# Patient Record
Sex: Male | Born: 1945 | Race: White | Hispanic: No | Marital: Single | Smoking: Never smoker
Health system: Southern US, Community
[De-identification: ages and names within clinical notes are randomized; demographics above are authoritative.]

## PROBLEM LIST (undated history)

## (undated) DIAGNOSIS — A419 Sepsis, unspecified organism: Secondary | ICD-10-CM

## (undated) DIAGNOSIS — R350 Frequency of micturition: Secondary | ICD-10-CM

## (undated) DIAGNOSIS — M542 Cervicalgia: Secondary | ICD-10-CM

## (undated) DIAGNOSIS — F329 Major depressive disorder, single episode, unspecified: Secondary | ICD-10-CM

## (undated) DIAGNOSIS — I2510 Atherosclerotic heart disease of native coronary artery without angina pectoris: Secondary | ICD-10-CM

## (undated) DIAGNOSIS — N183 Chronic kidney disease, stage 3 unspecified: Secondary | ICD-10-CM

## (undated) DIAGNOSIS — E039 Hypothyroidism, unspecified: Secondary | ICD-10-CM

## (undated) DIAGNOSIS — I350 Nonrheumatic aortic (valve) stenosis: Secondary | ICD-10-CM

## (undated) DIAGNOSIS — G479 Sleep disorder, unspecified: Secondary | ICD-10-CM

## (undated) DIAGNOSIS — N179 Acute kidney failure, unspecified: Secondary | ICD-10-CM

## (undated) DIAGNOSIS — I4729 Other ventricular tachycardia: Secondary | ICD-10-CM

## (undated) DIAGNOSIS — G8929 Other chronic pain: Secondary | ICD-10-CM

## (undated) DIAGNOSIS — L709 Acne, unspecified: Secondary | ICD-10-CM

## (undated) DIAGNOSIS — I10 Essential (primary) hypertension: Secondary | ICD-10-CM

## (undated) DIAGNOSIS — K219 Gastro-esophageal reflux disease without esophagitis: Secondary | ICD-10-CM

## (undated) DIAGNOSIS — E785 Hyperlipidemia, unspecified: Secondary | ICD-10-CM

## (undated) DIAGNOSIS — C801 Malignant (primary) neoplasm, unspecified: Secondary | ICD-10-CM

## (undated) DIAGNOSIS — K222 Esophageal obstruction: Secondary | ICD-10-CM

## (undated) DIAGNOSIS — Z8551 Personal history of malignant neoplasm of bladder: Secondary | ICD-10-CM

## (undated) DIAGNOSIS — M5030 Other cervical disc degeneration, unspecified cervical region: Secondary | ICD-10-CM

## (undated) DIAGNOSIS — G5600 Carpal tunnel syndrome, unspecified upper limb: Secondary | ICD-10-CM

## (undated) DIAGNOSIS — N401 Enlarged prostate with lower urinary tract symptoms: Secondary | ICD-10-CM

## (undated) DIAGNOSIS — Q549 Hypospadias, unspecified: Secondary | ICD-10-CM

## (undated) DIAGNOSIS — C679 Malignant neoplasm of bladder, unspecified: Secondary | ICD-10-CM

## (undated) DIAGNOSIS — I5189 Other ill-defined heart diseases: Secondary | ICD-10-CM

## (undated) DIAGNOSIS — E119 Type 2 diabetes mellitus without complications: Secondary | ICD-10-CM

## (undated) DIAGNOSIS — R7303 Prediabetes: Secondary | ICD-10-CM

## (undated) DIAGNOSIS — J309 Allergic rhinitis, unspecified: Secondary | ICD-10-CM

## (undated) DIAGNOSIS — M545 Low back pain, unspecified: Secondary | ICD-10-CM

## (undated) DIAGNOSIS — I340 Nonrheumatic mitral (valve) insufficiency: Secondary | ICD-10-CM

## (undated) DIAGNOSIS — R011 Cardiac murmur, unspecified: Secondary | ICD-10-CM

## (undated) DIAGNOSIS — G4730 Sleep apnea, unspecified: Secondary | ICD-10-CM

## (undated) DIAGNOSIS — F419 Anxiety disorder, unspecified: Secondary | ICD-10-CM

## (undated) DIAGNOSIS — R161 Splenomegaly, not elsewhere classified: Secondary | ICD-10-CM

## (undated) DIAGNOSIS — N138 Other obstructive and reflux uropathy: Secondary | ICD-10-CM

## (undated) DIAGNOSIS — D696 Thrombocytopenia, unspecified: Secondary | ICD-10-CM

## (undated) DIAGNOSIS — F32A Depression, unspecified: Secondary | ICD-10-CM

## (undated) DIAGNOSIS — K861 Other chronic pancreatitis: Secondary | ICD-10-CM

## (undated) DIAGNOSIS — M47816 Spondylosis without myelopathy or radiculopathy, lumbar region: Secondary | ICD-10-CM

## (undated) DIAGNOSIS — K2970 Gastritis, unspecified, without bleeding: Secondary | ICD-10-CM

## (undated) DIAGNOSIS — K7460 Unspecified cirrhosis of liver: Secondary | ICD-10-CM

## (undated) DIAGNOSIS — K649 Unspecified hemorrhoids: Secondary | ICD-10-CM

## (undated) DIAGNOSIS — D649 Anemia, unspecified: Secondary | ICD-10-CM

## (undated) DIAGNOSIS — E876 Hypokalemia: Secondary | ICD-10-CM

## (undated) HISTORY — PX: COLONOSCOPY, ESOPHAGOGASTRODUODENOSCOPY (EGD) AND ESOPHAGEAL DILATION: SHX5781

## (undated) HISTORY — PX: FLEXIBLE SIGMOIDOSCOPY: SHX1649

## (undated) HISTORY — PX: TONSILLECTOMY: SUR1361

## (undated) HISTORY — DX: Frequency of micturition: R35.0

## (undated) HISTORY — PX: BLADDER SURGERY: SHX569

## (undated) HISTORY — PX: SEPTOPLASTY: SUR1290

## (undated) HISTORY — DX: Personal history of malignant neoplasm of bladder: Z85.51

## (undated) HISTORY — DX: Other obstructive and reflux uropathy: N13.8

## (undated) HISTORY — DX: Type 2 diabetes mellitus without complications: E11.9

## (undated) HISTORY — PX: ADENOIDECTOMY: SUR15

## (undated) HISTORY — DX: Other obstructive and reflux uropathy: N40.1

## (undated) HISTORY — DX: Sleep apnea, unspecified: G47.30

## (undated) HISTORY — DX: Hypospadias, unspecified: Q54.9

---

## 1992-12-12 HISTORY — PX: FLEXIBLE SIGMOIDOSCOPY: SHX1649

## 1994-12-12 HISTORY — PX: SEPTOPLASTY: SUR1290

## 2004-12-12 HISTORY — PX: COLONOSCOPY, ESOPHAGOGASTRODUODENOSCOPY (EGD) AND ESOPHAGEAL DILATION: SHX5781

## 2005-03-02 ENCOUNTER — Ambulatory Visit: Payer: Self-pay | Admitting: Unknown Physician Specialty

## 2005-04-05 ENCOUNTER — Emergency Department: Payer: Self-pay | Admitting: Emergency Medicine

## 2005-04-27 ENCOUNTER — Ambulatory Visit: Payer: Self-pay | Admitting: Internal Medicine

## 2006-08-20 ENCOUNTER — Emergency Department: Payer: Self-pay | Admitting: Emergency Medicine

## 2006-08-20 ENCOUNTER — Other Ambulatory Visit: Payer: Self-pay

## 2006-09-07 ENCOUNTER — Ambulatory Visit: Payer: Self-pay | Admitting: Unknown Physician Specialty

## 2006-09-11 ENCOUNTER — Ambulatory Visit: Payer: Self-pay | Admitting: Unknown Physician Specialty

## 2006-10-12 ENCOUNTER — Ambulatory Visit: Payer: Self-pay | Admitting: Unknown Physician Specialty

## 2010-02-18 ENCOUNTER — Ambulatory Visit: Payer: Self-pay | Admitting: Urology

## 2010-03-03 ENCOUNTER — Ambulatory Visit: Payer: Self-pay | Admitting: Urology

## 2010-03-06 ENCOUNTER — Emergency Department: Payer: Self-pay | Admitting: Unknown Physician Specialty

## 2010-03-08 ENCOUNTER — Ambulatory Visit: Payer: Self-pay | Admitting: Urology

## 2012-03-07 ENCOUNTER — Ambulatory Visit: Payer: Self-pay | Admitting: Urology

## 2012-03-07 DIAGNOSIS — I10 Essential (primary) hypertension: Secondary | ICD-10-CM

## 2012-03-09 LAB — URINE CULTURE

## 2012-03-14 ENCOUNTER — Ambulatory Visit: Payer: Self-pay | Admitting: Urology

## 2012-03-19 LAB — PATHOLOGY REPORT

## 2012-12-10 DIAGNOSIS — N400 Enlarged prostate without lower urinary tract symptoms: Secondary | ICD-10-CM | POA: Insufficient documentation

## 2012-12-10 DIAGNOSIS — R350 Frequency of micturition: Secondary | ICD-10-CM | POA: Insufficient documentation

## 2012-12-10 DIAGNOSIS — R3129 Other microscopic hematuria: Secondary | ICD-10-CM | POA: Insufficient documentation

## 2012-12-10 DIAGNOSIS — Z8603 Personal history of neoplasm of uncertain behavior: Secondary | ICD-10-CM | POA: Insufficient documentation

## 2012-12-10 DIAGNOSIS — Q549 Hypospadias, unspecified: Secondary | ICD-10-CM | POA: Insufficient documentation

## 2013-03-01 LAB — BASIC METABOLIC PANEL
Anion Gap: 5 — ABNORMAL LOW (ref 7–16)
Calcium, Total: 8.8 mg/dL (ref 8.5–10.1)
Creatinine: 0.86 mg/dL (ref 0.60–1.30)
EGFR (African American): >60
EGFR (Non-African Amer.): >60
Osmolality: 279 (ref 275–301)
Potassium: 4 mmol/L (ref 3.5–5.1)
Sodium: 139 mmol/L (ref 136–145)

## 2013-03-01 LAB — CBC
HCT: 41.1 % (ref 40.0–52.0)
HGB: 14 g/dL (ref 13.0–18.0)
MCH: 30.3 pg (ref 26.0–34.0)
MCHC: 34 g/dL (ref 32.0–36.0)
Platelet: 127 10*3/uL — ABNORMAL LOW (ref 150–440)
RBC: 4.6 10*6/uL (ref 4.40–5.90)
RDW: 13.1 % (ref 11.5–14.5)

## 2013-03-01 LAB — TROPONIN I: Troponin-I: <0.02 ng/mL

## 2013-03-02 ENCOUNTER — Observation Stay: Payer: Self-pay | Admitting: Internal Medicine

## 2013-03-02 DIAGNOSIS — R0602 Shortness of breath: Secondary | ICD-10-CM

## 2013-03-02 LAB — TROPONIN I
Troponin-I: <0.02 ng/mL
Troponin-I: <0.02 ng/mL

## 2013-03-02 LAB — HEMOGLOBIN A1C: Hemoglobin A1C: 5.4 % (ref 4.2–6.3)

## 2013-03-02 LAB — CK TOTAL AND CKMB (NOT AT ARMC): CK-MB: 2.1 ng/mL (ref 0.5–3.6)

## 2013-03-02 LAB — LIPID PANEL
Cholesterol: 145 mg/dL (ref 0–200)
HDL Cholesterol: 44 mg/dL (ref 40–60)
Ldl Cholesterol, Calc: 75 mg/dL (ref 0–100)
Triglycerides: 131 mg/dL (ref 0–200)
VLDL Cholesterol, Calc: 26 mg/dL (ref 5–40)

## 2013-08-27 ENCOUNTER — Ambulatory Visit: Payer: Self-pay | Admitting: Urology

## 2013-08-27 LAB — POTASSIUM: Potassium: 4.2 mmol/L (ref 3.5–5.1)

## 2013-09-18 ENCOUNTER — Ambulatory Visit: Payer: Self-pay | Admitting: Urology

## 2013-09-19 DIAGNOSIS — C672 Malignant neoplasm of lateral wall of bladder: Secondary | ICD-10-CM | POA: Insufficient documentation

## 2014-05-13 DIAGNOSIS — M545 Low back pain, unspecified: Secondary | ICD-10-CM | POA: Insufficient documentation

## 2014-05-13 DIAGNOSIS — E785 Hyperlipidemia, unspecified: Secondary | ICD-10-CM | POA: Insufficient documentation

## 2014-05-13 DIAGNOSIS — R739 Hyperglycemia, unspecified: Secondary | ICD-10-CM | POA: Insufficient documentation

## 2014-05-13 DIAGNOSIS — D649 Anemia, unspecified: Secondary | ICD-10-CM | POA: Insufficient documentation

## 2014-05-13 DIAGNOSIS — I10 Essential (primary) hypertension: Secondary | ICD-10-CM | POA: Insufficient documentation

## 2015-04-03 NOTE — Op Note (Signed)
PATIENT NAME:  Trevor [name], Trevor [name] MR#:  [id number] DATE OF BIRTH:  October 18, 1946  DATE OF PROCEDURE:  09/18/2013  PREOPERATIVE DIAGNOSIS: Bladder tumor.   POSTOPERATIVE DIAGNOSIS: Bladder tumor.   PROCEDURE: Transurethral resection of bladder tumor (small).   SURGEON: John Giovanni, M.D.   ASSISTANT: None.   ANESTHESIA: General.   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 Recent surveillance cystoscopy showed a hemorrhagic area with early papillary change on the right lateral wall. He has elected TURBT.   DESCRIPTION OF PROCEDURE: The patient was taken to the Operating Room and placed on the table in the supine position. A general anesthetic was administered via an LMA. Timeout was performed per protocol with all in agreement. The patient was then placed in the low lithotomy position and his external genitalia were prepped and draped in the usual fashion.   He had a distal shaft hypospadias. The meatus was dilated with Leander Rams sounds to 75 [place] without difficulty. A 27-French continuous-flow resectoscope sheath with visual obturator was  easily placed in the bladder. The Iglesias resectoscope was then placed into the sheath. The hemorrhagic area on the right lateral wall had resolved; however, near his prior resection site was mucosa with early papillary change. This area encompassed approximately 1 cm and was resected down to superficial muscle. Resection site was fulgurated.   At completion of procedure, hemostasis was adequate. All specimen was removed with irrigation. The ureteral orifices were normal-appearing with clear efflux. No other bladder mucosal lesions were identified. The prostate shows moderate lateral lobe enlargement. Resectoscope  was removed. An 18-French Foley catheter was placed with return of clear effluent upon irrigation. A B and O suppository was placed per rectum. He was taken to PACU in stable condition. There were no complications.   EBL: Minimal.     ____________________________ Ronda Fairly. Bernardo Heater, MD scs:cs D: 09/18/2013 15:04:00 ET T: 09/18/2013 15:52:51 ET JOB#: [id number]  cc: Nicki Reaper C. Bernardo Heater, MD, <Dictator> Abbie Sons MD ELECTRONICALLY SIGNED 09/25/2013 12:33

## 2015-04-03 NOTE — Discharge Summary (Signed)
PATIENT NAME:  Trevor [name], OSTERMILLER MR#:  [id number] DATE OF BIRTH:  11/22/1946  DATE OF ADMISSION:  03/02/2013 DATE OF DISCHARGE:  03/02/2013  DISCHARGE DIAGNOSES:  1.  Hypertensive urgency.  2.  Chest tightness.  3.  Hyperlipidemia.  4.  Gastroesophageal reflux disease.   PRIMARY CARE PHYSICIAN:  Dr. Arline Asp and Dr. Queen Blossom, [facility].   HISTORY OF PRESENT ILLNESS:  The patient is a 69 year old Caucasian male with past history of hyperlipidemia and GERD presenting to ER with chief complaint of chest tightness, was reporting that for the past 2 days he was feeling lightheaded and blood pressure was elevated when he checked it on Thursday.  It was around 140/90, but on the day of presentation it was 179/100 and subsequently it went up to 215/100.  At that time he was also having headache, chest tightness and lightheadedness and so he called his brother and he reported chest tightness to him for approximately 30 minutes, so his brother suggested him to go to the Emergency Room immediately.   HOSPITAL COURSE AND STAY:   1.  The patient was given nitroglycerin sublingual for his chest tightness and was started on oral medications to control his blood pressure better.  his heart rate was in 40s when he was sleeping.  He was started on amlodipine and hydrochlorothiazide and his blood pressure came under control, given prescription of this and advised to follow up with primary care physician.  2.  Chest tightness, was also complaining short of breath on exertion.  His troponins were negative.  EKG showed left ventricular hypertrophy and his echocardiogram was done which reported ejection fraction to be normal 55% to 60%, mild left ventricular hypertrophy, normal diastolic function, mild aortic valve sclerosis without stenosis, high-normal right ventricular systolic pressure at 32 mmHg, mild dilation of aortic root and ascending aorta.   OTHER ISSUES ADDRESSED DURING THIS HOSPITAL STAY:   1.  Chest  tightness.  Troponins were negative.  Echocardiogram reported negative for any abnormalities.  Mild hypertrophic left ventricular cardiomyopathy, low-dose of lisinopril, advised to follow up with primary care physician.  2.  Hyperlipidemia.  Atorvastatin and follow up with primary care physician.  3.  Gastroesophageal reflux disease.  Continued on PPI.   IMPORTANT LABORATORY RESULTS IN THE HOSPITAL:  Echocardiogram revealed as discussed above ago.  Troponin less than 0.02.  Cholesterol total 145, triglyceride 131, HDL 44, LDL 75.   CODE STATUS:  FULL CODE.  CONDITION ON DISCHARGE:  Stable.   DISCHARGE MEDICATIONS:  Lisinopril 5 mg once a day, atorvastatin 20 mg once a day, amlodipine 5 mg once a day, pantoprazole 40 mg delayed tablet once a day.   HOME OXYGEN:  No.   DIET ON DISCHARGE:  Low sodium, low fat, low cholesterol, regular consistency diet.   TIMEFRAME TO FOLLOW-UP:  In 1 to 2 weeks' timeframe to follow up with primary care physician to check the blood pressure control and follow up with primary care about the need of cardiac work-up.    TOTAL TIME SPENT:  45 minutes.      ____________________________ Ceasar Lund Anselm Jungling, MD vgv:ea D: 03/05/2013 23:09:57 ET T: 03/06/2013 05:57:36 ET JOB#: [id number]  cc: Ceasar Lund. Anselm Jungling, MD, <Dictator> Dr. Queen Blossom, [place]. Arline Asp, MD  Vaughan Basta MD ELECTRONICALLY SIGNED 03/14/2013 14:54

## 2015-04-03 NOTE — H&P (Signed)
PATIENT NAME:  Trevor [name], Trevor [name] MR#:  [id number] DATE OF BIRTH:  July 24, 1946  DATE OF ADMISSION:  03/02/2013  PRIMARY CARE PHYSICIAN:  Dr. Apolonio Schneiders from [place].   REFERRING PHYSICIAN:  Dr. Lemar Livings.  CHIEF COMPLAINT:  Chest tightness.   HISTORY OF PRESENT ILLNESS:  The patient is a 69 year old Caucasian male with a past medical history of hyperlipidemia and GERD is presenting to the ER with a chief complaint of chest tightness. The patient was reporting that for the past two days he was feeling lightheaded and the blood pressures were elevated. When he checked it on Thursday, it was at around 140/90 and today the blood pressure was at 179/100, and subsequently it went up to 215/100. At that time he was having headache, chest tightness and lightheadedness. As it was not getting better. He called his brother and came into the ER. He reported that the chest tightness has lasted for approximately 30 minutes. Associated with lightheadedness but denies any nausea, vomiting or diaphoresis. No similar complaints in the past. He was never diagnosed with hypertension and not on any medications. He has a blood pressure meter at home as his family members have a history of hypertension. The patient denies any stressors.   PAST MEDICAL HISTORY:  Hyperlipidemia, GERD, allergic rhinitis.   PAST SURGICAL HISTORY:  Bladder tumor removal which was benign, tonsillectomy.  ALLERGIES:  He has no known drug allergies.   HOME MEDICATIONS:  Vytorin 10/40 once daily, Sudafed 1 tablet once daily, Prilosec over-the-counter once daily, aspirin 81 mg once a day, acyclovir 400 mg 2 times a day.   PSYCHOSOCIAL HISTORY:  Lives at home, lives alone now. Denies any history of smoking, alcohol or illicit drug usage.   FAMILY HISTORY:  Mother deceased with CHF at age 49, dad deceased with heart attack at age 75, his other brother who is at age 42 had 2 coronary artery bypass grafting so far.   REVIEW OF SYSTEMS:   CONSTITUTIONAL:  Denies any fever, fatigue, weight loss or weight gain.  EYES:  No blurry vision, glaucoma, cataracts.  EARS, NOSE, THROAT:  No epistaxis, discharge, postnasal drip, or difficulty in swallowing.  RESPIRATIONS:  No cough, COPD, hemoptysis.  CARDIOVASCULAR:  Complaining of chest tightness, which completely gone now. Denies any edema, syncope, varicose veins.  GASTROINTESTINAL:  No nausea, vomiting, diarrhea, has history of GERD.  GENITOURINARY:  No dysuria or hematuria. ENDOCRINE:  No polyuria, nocturia, thyroid problems.  HEMATOLOGIC AND LYMPHATIC:  No anemia, easy bruising.  INTEGUMENTARY:  No acne, rash, lesions.  MUSCULOSKELETAL:  No pain in the neck, back, shoulder. Denies any gout or arthritis.  NEUROLOGIC:  Denies any history of vertigo, ataxia, dementia, TIA or CVA.  PSYCHIATRIC:  No insomnia, ADD, OCD.   PHYSICAL EXAMINATION: VITAL SIGNS:  Temperature 98.3, pulse 60, respirations 18, blood pressure 149/83, pulse ox 95% on 2 liters.  GENERAL APPEARANCE:  Not in acute distress, moderately built and moderately nourished.  HEENT:  Normocephalic, atraumatic. Pupils are equally reactive to light and accommodation. No conjunctival injection. No scleral icterus. Extraocular movements are intact. No sinus tenderness. Moist mucous membranes.  OROPHARYNX:  Intact. Dentition is intact. No pharyngeal tonsillar exudates.  NECK:  Supple. No JVD. No thyromegaly. No lymphadenopathy.  LUNGS:  Clear to auscultation bilaterally, no accessory muscle usage. No anterior chest wall tenderness on palpation.  CARDIAC:  S1, S2 normal. Regular rate and rhythm. No murmurs.  GASTROINTESTINAL:  Soft. Bowel sounds are positive in all 4 quadrants.  Nontender, nondistended. No hepatosplenomegaly.  NEUROLOGIC:  Awake, alert, oriented x 3. Motor and sensory are grossly intact. Reflexes are 2+. Cranial nerves II through XII are intact.  EXTREMITIES:  No edema. No cyanosis. No clubbing. Peripheral pulses  are 2+.  MUSCULOSKELETAL:  No joint effusion, erythema or tenderness.  SKIN:  No rashes, lesions. Normal turgor. Warm to touch.   LABORATORIES AND IMAGING STUDIES:  EKG has revealed normal sinus rhythm at 65 beats per minute, normal P-R interval, normal QRS interval, normal QT and corrected QTintervals. CK total 149, troponin less than 0.02 x 2. WBC 6.9, hemoglobin 14.0, hematocrit 41.1, platelet count 127, MCV 89, creatinine 0.86, sodium 139, potassium 4.0, chloride 104, CO2 30, anion gap is 5, GFR greater than 60, serum osmolality 279, calcium 8.8, glucose 125, BUN 12.   ASSESSMENT AND PLAN:  The patient is a 69 year old male presenting to the ER with a chief complaint of chest tightness when the blood pressures are elevated with also a past diagnosis of hypertension. Will be admitted with the following assessment and plan.   1.  Chest tightness, rule out acute myocardial infarction. We will admit him to telemetry under observation status, cycle cardiac biomarkers. We will implement acute coronary syndrome (ACS) protocol, oxygen, nitroglycerin, aspirin, beta blocker and statin will be provided. The chest tightness is most likely because of hypertensive malignancy. It needs to be addressed.  2.  Elevated blood pressure with a new diagnosis of hypertension. The patient is started on metoprolol 25 mg twice a day. We will monitor blood pressure closely and titrate as needed basis.  3.  Gastroesophageal reflux disease. We will provide him Protonix.  4.  Hyperlipidemia: Check fasting lipid panel in a.m., and the patient is started on statin. We will resume him to Vytorin at the time of discharge. 5.  Thrombocytopenia, etiology is unclear. We will continue close monitoring of the platelet count. We will provide him gastrointestinal and deep vein thrombosis prophylaxis.   CODE STATUS:  HE IS FULL CODE.   Plan of care discussed with the patient. The patient will be transferred to [place] in a.m.    TOTAL TIME SPENT ON THE ADMISSION:  45 minutes   ____________________________ Nicholes Mango, MD ag:jm D: 03/02/2013 03:07:00 ET T: 03/02/2013 10:20:44 ET JOB#: [id number]  cc: Nicholes Mango, MD, <Dictator> Nicholes Mango MD ELECTRONICALLY SIGNED 03/10/2013 23:14

## 2015-07-30 ENCOUNTER — Encounter: Payer: Self-pay | Admitting: *Deleted

## 2015-07-31 ENCOUNTER — Ambulatory Visit: Payer: Medicare Other | Admitting: Anesthesiology

## 2015-07-31 ENCOUNTER — Encounter
Admission: RE | Disposition: A | Discharge status: Home / Self Care | Payer: Self-pay | Source: Ambulatory Visit | Attending: Unknown Physician Specialty

## 2015-07-31 ENCOUNTER — Ambulatory Visit
Admission: RE | Admit: 2015-07-31 | Discharge: 2015-07-31 | Disposition: A | Discharge status: Home / Self Care | Payer: Medicare Other | Source: Ambulatory Visit | Attending: Unknown Physician Specialty | Admitting: Unknown Physician Specialty

## 2015-07-31 ENCOUNTER — Encounter: Payer: Self-pay | Admitting: Anesthesiology

## 2015-07-31 DIAGNOSIS — K648 Other hemorrhoids: Secondary | ICD-10-CM | POA: Insufficient documentation

## 2015-07-31 DIAGNOSIS — E785 Hyperlipidemia, unspecified: Secondary | ICD-10-CM | POA: Insufficient documentation

## 2015-07-31 DIAGNOSIS — Z1211 Encounter for screening for malignant neoplasm of colon: Secondary | ICD-10-CM | POA: Diagnosis not present

## 2015-07-31 DIAGNOSIS — G709 Myoneural disorder, unspecified: Secondary | ICD-10-CM | POA: Insufficient documentation

## 2015-07-31 DIAGNOSIS — Z79899 Other long term (current) drug therapy: Secondary | ICD-10-CM | POA: Insufficient documentation

## 2015-07-31 DIAGNOSIS — F329 Major depressive disorder, single episode, unspecified: Secondary | ICD-10-CM | POA: Insufficient documentation

## 2015-07-31 DIAGNOSIS — K219 Gastro-esophageal reflux disease without esophagitis: Secondary | ICD-10-CM | POA: Diagnosis not present

## 2015-07-31 DIAGNOSIS — M1990 Unspecified osteoarthritis, unspecified site: Secondary | ICD-10-CM | POA: Diagnosis not present

## 2015-07-31 DIAGNOSIS — M5030 Other cervical disc degeneration, unspecified cervical region: Secondary | ICD-10-CM | POA: Insufficient documentation

## 2015-07-31 DIAGNOSIS — K640 First degree hemorrhoids: Secondary | ICD-10-CM | POA: Diagnosis not present

## 2015-07-31 DIAGNOSIS — Z7982 Long term (current) use of aspirin: Secondary | ICD-10-CM | POA: Insufficient documentation

## 2015-07-31 DIAGNOSIS — Z859 Personal history of malignant neoplasm, unspecified: Secondary | ICD-10-CM | POA: Diagnosis not present

## 2015-07-31 DIAGNOSIS — I10 Essential (primary) hypertension: Secondary | ICD-10-CM | POA: Insufficient documentation

## 2015-07-31 DIAGNOSIS — F419 Anxiety disorder, unspecified: Secondary | ICD-10-CM | POA: Diagnosis not present

## 2015-07-31 HISTORY — DX: Other cervical disc degeneration, unspecified cervical region: M50.30

## 2015-07-31 HISTORY — DX: Nonrheumatic mitral (valve) insufficiency: I34.0

## 2015-07-31 HISTORY — DX: Anxiety disorder, unspecified: F41.9

## 2015-07-31 HISTORY — DX: Essential (primary) hypertension: I10

## 2015-07-31 HISTORY — DX: Anemia, unspecified: D64.9

## 2015-07-31 HISTORY — PX: COLONOSCOPY WITH PROPOFOL: SHX5780

## 2015-07-31 HISTORY — DX: Hyperlipidemia, unspecified: E78.5

## 2015-07-31 HISTORY — DX: Major depressive disorder, single episode, unspecified: F32.9

## 2015-07-31 HISTORY — DX: Acne, unspecified: L70.9

## 2015-07-31 HISTORY — DX: Low back pain, unspecified: M54.50

## 2015-07-31 HISTORY — DX: Carpal tunnel syndrome, unspecified upper limb: G56.00

## 2015-07-31 HISTORY — DX: Gastro-esophageal reflux disease without esophagitis: K21.9

## 2015-07-31 HISTORY — DX: Depression, unspecified: F32.A

## 2015-07-31 HISTORY — DX: Low back pain: M54.5

## 2015-07-31 HISTORY — DX: Unspecified hemorrhoids: K64.9

## 2015-07-31 HISTORY — DX: Malignant (primary) neoplasm, unspecified: C80.1

## 2015-07-31 HISTORY — DX: Allergic rhinitis, unspecified: J30.9

## 2015-07-31 HISTORY — DX: Cervicalgia: M54.2

## 2015-07-31 SURGERY — COLONOSCOPY WITH PROPOFOL
Anesthesia: General

## 2015-07-31 MED ORDER — SODIUM CHLORIDE 0.9 % IV SOLN
INTRAVENOUS | Status: DC
Start: 2015-07-31 — End: 2015-07-31

## 2015-07-31 MED ORDER — LIDOCAINE HCL (CARDIAC) 20 MG/ML IV SOLN
INTRAVENOUS | Status: DC | PRN
  Administered 2015-07-31: 20 mg via INTRAVENOUS

## 2015-07-31 MED ORDER — SODIUM CHLORIDE 0.9 % IV SOLN
INTRAVENOUS | Status: DC
  Administered 2015-07-31: 1000 mL via INTRAVENOUS

## 2015-07-31 MED ORDER — PROPOFOL 10 MG/ML IV BOLUS
INTRAVENOUS | Status: DC | PRN
  Administered 2015-07-31: 80 mg via INTRAVENOUS

## 2015-07-31 MED ORDER — PROPOFOL INFUSION 10 MG/ML OPTIME
INTRAVENOUS | Status: DC | PRN
  Administered 2015-07-31: 120 ug/kg/min via INTRAVENOUS

## 2015-07-31 MED ORDER — GLYCOPYRROLATE 0.2 MG/ML IJ SOLN
INTRAMUSCULAR | Status: DC | PRN
Start: 2015-07-31 — End: 2015-07-31
  Administered 2015-07-31: 0.2 mg via INTRAVENOUS

## 2015-07-31 NOTE — H&P (Signed)
Primary Care Physician:  Marcello Fennel, MD Primary Gastroenterologist:  Dr. Vira Agar  Pre-Procedure History & Physical: HPI:  Trevor [name] is a 69 y.o. male is here for an colonoscopy.   Past Medical History  Diagnosis Date  . Hypertension   . Anemia   . Hyperlipidemia   . Low back pain   . Cervicalgia   . Allergic rhinitis   . GERD (gastroesophageal reflux disease)   . Hemorrhoids   . Acne   . Carpal tunnel syndrome   . Anxiety   . Depression   . Cancer   . Mitral regurgitation   . Degenerative disc disease, cervical     Past Surgical History  Procedure Laterality Date  . Tonsillectomy    . Septoplasty    . Colonoscopy, esophagogastroduodenoscopy (egd) and esophageal dilation    . Flexible sigmoidoscopy      Prior to Admission medications   Medication Sig Start Date End Date Taking? Authorizing Provider  amoxicillin-clavulanate (AUGMENTIN) 875-125 MG per tablet Take 1 tablet by mouth 2 (two) times daily.   Yes Historical Provider, MD  aspirin 81 MG tablet Take 81 mg by mouth daily.   Yes Historical Provider, MD  b complex vitamins capsule Take 1 capsule by mouth daily.   Yes Historical Provider, MD  cetirizine (ZYRTEC) 10 MG tablet Take 10 mg by mouth daily.   Yes Historical Provider, MD  lisinopril-hydrochlorothiazide (PRINZIDE,ZESTORETIC) 20-12.5 MG per tablet Take 1 tablet by mouth daily.   Yes Historical Provider, MD  Multiple Vitamin (MULTIVITAMIN WITH MINERALS) TABS tablet Take 1 tablet by mouth daily.   Yes Historical Provider, MD  omeprazole (PRILOSEC OTC) 20 MG tablet Take 20 mg by mouth daily.   Yes Historical Provider, MD  simvastatin (ZOCOR) 40 MG tablet Take 40 mg by mouth daily.   Yes Historical Provider, MD  sucralfate (CARAFATE) 1 G tablet Take 1.5 g by mouth 4 (four) times daily -  with meals and at bedtime.   Yes Historical Provider, MD  tamsulosin (FLOMAX) 0.4 MG CAPS capsule Take 0.4 mg by mouth.   Yes Historical Provider, MD    Allergies as of  06/16/2015  . (Not on File)    History reviewed. No pertinent family history.  Social History   Social History  . Marital Status: Single    Spouse Name: N/A  . Number of Children: N/A  . Years of Education: N/A   Occupational History  . Not on file.   Social History Main Topics  . Smoking status: Not on file  . Smokeless tobacco: Not on file  . Alcohol Use: Not on file  . Drug Use: Not on file  . Sexual Activity: Not on file   Other Topics Concern  . Not on file   Social History Narrative    Review of Systems: See HPI, otherwise negative ROS  Physical Exam: BP 143/84 mmHg  Temp(Src) 98 F (36.7 C) (Tympanic)  Resp 22  Ht 5\' 8"  (1.727 m)  Wt 81.647 kg (180 lb)  BMI 27.38 kg/m2  SpO2 100% General:   Alert,  pleasant and cooperative in NAD Head:  Normocephalic and atraumatic. Neck:  Supple; no masses or thyromegaly. Lungs:  Clear throughout to auscultation.    Heart:  Regular rate and rhythm. Abdomen:  Soft, nontender and nondistended. Normal bowel sounds, without guarding, and without rebound.   Neurologic:  Alert and  oriented x4;  grossly normal neurologically.  Impression/Plan: Trevor [name] is here for an  colonoscopy to be performed for screening  Risks, benefits, limitations, and alternatives regarding  colonoscopy have been reviewed with the patient.  Questions have been answered.  All parties agreeable.   Gaylyn Cheers, MD  07/31/2015, 10:08 AM

## 2015-07-31 NOTE — Anesthesia Postprocedure Evaluation (Signed)
  Anesthesia Post-op Note  Patient: Trevor [name]  Procedure(s) Performed: Procedure(s): COLONOSCOPY WITH PROPOFOL (N/A)  Anesthesia type:General  Patient location: PACU  Post pain: Pain level controlled  Post assessment: Post-op Vital signs reviewed, Patient's Cardiovascular Status Stable, Respiratory Function Stable, Patent Airway and No signs of Nausea or vomiting  Post vital signs: Reviewed and stable  Last Vitals:  Filed Vitals:   07/31/15 1105  BP: 123/60  Pulse: 54  Temp:   Resp: 16    Level of consciousness: awake, alert  and patient cooperative  Complications: No apparent anesthesia complications

## 2015-07-31 NOTE — Anesthesia Preprocedure Evaluation (Signed)
Anesthesia Evaluation  Patient identified by MRN, date of birth, ID band Patient awake    Reviewed: Allergy & Precautions, H&P , NPO status , Patient's Chart, lab work & pertinent test results  Airway Mallampati: III  TM Distance: >3 FB Neck ROM: limited    Dental  (+) Poor Dentition   Pulmonary neg pulmonary ROS,  breath sounds clear to auscultation  Pulmonary exam normal       Cardiovascular Exercise Tolerance: Good hypertension, - Past MI Normal cardiovascular examRhythm:regular Rate:Normal     Neuro/Psych PSYCHIATRIC DISORDERS Anxiety Depression  Neuromuscular disease    GI/Hepatic Neg liver ROS, GERD-  Controlled,  Endo/Other  negative endocrine ROS  Renal/GU negative Renal ROS  negative genitourinary   Musculoskeletal  (+) Arthritis -,   Abdominal   Peds  Hematology negative hematology ROS (+)   Anesthesia Other Findings Past Medical History:   Hypertension                                                 Anemia                                                       Hyperlipidemia                                               Low back pain                                                Cervicalgia                                                  Allergic rhinitis                                            GERD (gastroesophageal reflux disease)                       Hemorrhoids                                                  Acne                                                         Carpal tunnel syndrome  Anxiety                                                      Depression                                                   Cancer                                                       Mitral regurgitation                                         Degenerative disc disease, cervical                          Reproductive/Obstetrics negative OB ROS                              Anesthesia Physical Anesthesia Plan  ASA: III  Anesthesia Plan: General   Post-op Pain Management:    Induction:   Airway Management Planned:   Additional Equipment:   Intra-op Plan:   Post-operative Plan:   Informed Consent: I have reviewed the patients History and Physical, chart, labs and discussed the procedure including the risks, benefits and alternatives for the proposed anesthesia with the patient or authorized representative who has indicated his/her understanding and acceptance.   Dental Advisory Given  Plan Discussed with: Anesthesiologist and Surgeon  Anesthesia Plan Comments:         Anesthesia Quick Evaluation

## 2015-07-31 NOTE — Op Note (Signed)
[facility] Gastroenterology Patient Name: Trevor [name] Procedure Date: 07/31/2015 10:03 AM MRN: [id number] Account #: [id number] Date of Birth: Oct 30, 1946 Admit Type: Outpatient Age: 69 Room: [facility] ENDO ROOM 1 Gender: Male Note Status: Finalized Procedure:         Colonoscopy Indications:       Screening for colorectal malignant neoplasm Providers:         Manya Silvas, MD Referring MD:      Caprice Renshaw (Referring MD) Medicines:         Propofol per Anesthesia Complications:     No immediate complications. Procedure:         Pre-Anesthesia Assessment:                    - After reviewing the risks and benefits, the patient was                     deemed in satisfactory condition to undergo the procedure.                    After obtaining informed consent, the colonoscope was                     passed under direct vision. Throughout the procedure, the                     patient's blood pressure, pulse, and oxygen saturations                     were monitored continuously. The Olympus PCF-H180AL                     colonoscope ( S#: [id number] ) was introduced through the                     anus and advanced to the the cecum, identified by                     appendiceal orifice and ileocecal valve. The colonoscopy                     was performed without difficulty. The patient tolerated                     the procedure well. The quality of the bowel preparation                     was excellent. Findings:      Internal hemorrhoids were found during endoscopy. The hemorrhoids were       small and Grade I (internal hemorrhoids that do not prolapse).      The exam was otherwise without abnormality. Impression:        - Internal hemorrhoids.                    - The examination was otherwise normal.                    - No specimens collected. Recommendation:    - Repeat colonoscopy in 10 years for screening purposes. Manya Silvas, MD 07/31/2015  10:37:21 AM This report has been signed electronically. Number of Addenda: 0 Note Initiated On: 07/31/2015 10:03 AM Scope Withdrawal Time: 0 hours 13 minutes 27 seconds  Total Procedure Duration: 0 hours 17 minutes 33 seconds  [facility]

## 2015-07-31 NOTE — Transfer of Care (Signed)
Immediate Anesthesia Transfer of Care Note  Patient: Trevor [name]  Procedure(s) Performed: Procedure(s): COLONOSCOPY WITH PROPOFOL (N/A)  Patient Location: Endoscopy Unit  Anesthesia Type:General  Level of Consciousness: sedated  Airway & Oxygen Therapy: Patient Spontanous Breathing and Patient connected to nasal cannula oxygen  Post-op Assessment: Report given to RN and Post -op Vital signs reviewed and stable  Post vital signs: Reviewed and stable  Last Vitals:  Filed Vitals:   07/31/15 0906  BP: 143/84  Temp: 36.7 C  Resp: 22    Complications: No apparent anesthesia complications

## 2015-08-03 ENCOUNTER — Encounter: Payer: Self-pay | Admitting: Unknown Physician Specialty

## 2015-10-09 ENCOUNTER — Other Ambulatory Visit: Payer: Self-pay

## 2015-10-21 ENCOUNTER — Encounter: Payer: Self-pay | Admitting: Urology

## 2015-10-23 ENCOUNTER — Ambulatory Visit (INDEPENDENT_AMBULATORY_CARE_PROVIDER_SITE_OTHER): Payer: Medicare Other | Admitting: Urology

## 2015-10-23 ENCOUNTER — Encounter: Payer: Self-pay | Admitting: Urology

## 2015-10-23 VITALS — BP 155/90 | HR 79 | Ht 68.0 in | Wt 183.7 lb

## 2015-10-23 DIAGNOSIS — R351 Nocturia: Secondary | ICD-10-CM | POA: Insufficient documentation

## 2015-10-23 DIAGNOSIS — Z8551 Personal history of malignant neoplasm of bladder: Secondary | ICD-10-CM

## 2015-10-23 DIAGNOSIS — Q541 Hypospadias, penile: Secondary | ICD-10-CM | POA: Diagnosis not present

## 2015-10-23 DIAGNOSIS — N400 Enlarged prostate without lower urinary tract symptoms: Secondary | ICD-10-CM

## 2015-10-23 LAB — MICROSCOPIC EXAMINATION
BACTERIA UA: NONE SEEN
EPITHELIAL CELLS (NON RENAL): NONE SEEN /HPF (ref 0–10)

## 2015-10-23 LAB — URINALYSIS, COMPLETE
BILIRUBIN UA: NEGATIVE
GLUCOSE, UA: NEGATIVE
Ketones, UA: NEGATIVE
LEUKOCYTES UA: NEGATIVE
Nitrite, UA: NEGATIVE
PROTEIN UA: NEGATIVE
SPEC GRAV UA: 1.02 (ref 1.005–1.030)
UUROB: 0.2 mg/dL (ref 0.2–1.0)
pH, UA: 6.5 (ref 5.0–7.5)

## 2015-10-23 MED ORDER — CIPROFLOXACIN HCL 500 MG PO TABS
500.0000 mg | ORAL_TABLET | Freq: Once | ORAL | Status: AC
  Administered 2015-10-23: 500 mg via ORAL

## 2015-10-23 MED ORDER — LIDOCAINE HCL 2 % EX GEL
1.0000 "application " | Freq: Once | CUTANEOUS | Status: AC
  Administered 2015-10-23: 1 via URETHRAL

## 2015-10-23 NOTE — Progress Notes (Signed)
10/23/2015 11:42 AM   Trevor [name] February 05, 1946 [id number]  Referring provider: Derinda Late, MD [phone] [facility] [address], [place] [address]  Chief Complaint  Patient presents with  . Cysto    52month    HPI: 69 year old male with a history of bladder cancer first diagnosed in 2011, no great TA disease identified and gross hematuria workup. 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  His most recent surveillance cystoscopy was 04/12/2015 at which time we were surveying a small area of hypervascularity near the area of resection scar measuring less than 0.5 cm adjacent to the right UO which remained unchanged from previous cysto. Urine cytology was negative.  No recent episodes of gross hematuria.  No risk factors for bladder cancer including no smoking history.  Most recent upper tract imaging at the time of diagnosis in 2011.   He does have some mild urinary symptoms associated with BPH currently on Flomax. Today, he complains that he is most bothered by nocturia 3-4. He does admit that he drinks caffeinated average blood before bed. He is a snorer and has never been worked up for sleep apnea. He is recently started a supplement which has not helped with his urinary symptoms.  PSA/rectal exams performed by a PCP annually.  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  He returns today for 6 month surveillance cystoscopy.   PMH: Past Medical History  Diagnosis Date  . Hypertension   . Anemia   . Hyperlipidemia   . Low back pain   . Cervicalgia   . Allergic rhinitis   . GERD (gastroesophageal reflux disease)   . Hemorrhoids   . Acne   . Carpal tunnel syndrome   . Anxiety   . Depression   . Cancer ([place])   . Mitral regurgitation   .  Degenerative disc disease, cervical   . Urinary frequency   . Sleep apnea   . Hypospadias   . BPH (benign prostatic hypertrophy) with urinary obstruction   . History of malignant neoplasm of bladder     Surgical History: Past Surgical History  Procedure Laterality Date  . Tonsillectomy    . Septoplasty    . Colonoscopy, esophagogastroduodenoscopy (egd) and esophageal dilation    . Flexible sigmoidoscopy    . Colonoscopy with propofol N/A 07/31/2015    Procedure: COLONOSCOPY WITH PROPOFOL;  Surgeon: Manya Silvas, MD;  [facility]: [facility] ENDOSCOPY;  Service: Endoscopy;  Laterality: N/A;  . Bladder surgery      2011, 2013, 2015    Home Medications:    Medication List       This list is accurate as of: 10/23/15 11:42 AM.  Always use your most recent med list.               aspirin 81 MG tablet  Take 81 mg by mouth daily.     b complex vitamins capsule  Take 1 capsule by mouth daily.     cetirizine 10 MG tablet  Commonly known as:  ZYRTEC  Take 10 mg by mouth daily.     lisinopril-hydrochlorothiazide 10-12.5 MG tablet  Commonly known as:  PRINZIDE,ZESTORETIC     multivitamin with minerals Tabs tablet  Take 1 tablet by mouth daily.     omeprazole 20 MG tablet  Commonly known as:  PRILOSEC OTC  Take 20 mg by mouth daily.     simvastatin 40 MG tablet  Commonly known as:  ZOCOR  Take 40 mg by mouth daily.     sucralfate 1 G tablet  Commonly known as:  CARAFATE  Take 1.5 g by mouth 4 (four) times daily -  with meals and at bedtime.     tamsulosin 0.4 MG Caps capsule  Commonly known as:  FLOMAX  Take 0.4 mg by mouth.     URINOZINC PLUS Tabs  Take by mouth.        Allergies: No Known Allergies  Family History: Family History  Problem Relation Age of Onset  . Hyperlipidemia Brother   . Hyperlipidemia Sister     Social History:  reports that he has never smoked. He does not have any smokeless tobacco history on file. He reports that he does not drink  alcohol or use illicit drugs.  ROS: UROLOGY Frequent Urination?: Yes Hard to postpone urination?: No Burning/pain with urination?: No Get up at night to urinate?: Yes Leakage of urine?: No Urine stream starts and stops?: Yes Trouble starting stream?: No Do you have to strain to urinate?: No Blood in urine?: No Urinary tract infection?: No Sexually transmitted disease?: No Injury to kidneys or bladder?: No Painful intercourse?: No Weak stream?: No Erection problems?: No Penile pain?: No  Gastrointestinal Nausea?: No Vomiting?: No Indigestion/heartburn?: No Diarrhea?: No Constipation?: No  Constitutional Fever: No Night sweats?: No Weight loss?: No Fatigue?: No  Skin Skin rash/lesions?: No Itching?: No  Eyes Blurred vision?: No Double vision?: No  Ears/Nose/Throat Sore throat?: No Sinus problems?: Yes  Hematologic/Lymphatic Swollen glands?: No Easy bruising?: No  Cardiovascular Leg swelling?: No Chest pain?: No  Respiratory Cough?: No Shortness of breath?: No  Endocrine Excessive thirst?: No  Musculoskeletal Back pain?: Yes Joint pain?: Yes  Neurological Headaches?: No Dizziness?: No  Psychologic Depression?: No Anxiety?: No  Physical Exam: BP 155/90 mmHg  Pulse 79  Ht 5\' 8"  (1.727 m)  Wt 183 lb 11.2 oz (83.326 kg)  BMI 27.94 kg/m2  Constitutional:  Alert and oriented, No acute distress. HEENT: [place] AT, moist mucus membranes.  Trachea midline, no masses. Cardiovascular: No clubbing, cyanosis, or edema. Respiratory: Normal respiratory effort, no increased work of breathing. GI: Abdomen is soft, nontender, nondistended, no abdominal masses GU: No CVA tenderness. Normal bilateral descended testicles. Phallus with dorsal hood, subcoronal hypospadias. Skin: No rashes, bruises or suspicious lesions. Lymph: No cervical or inguinal adenopathy. Neurologic: Grossly intact, no focal deficits, moving all 4 extremities. Psychiatric: Normal mood  and affect.  Laboratory Data: Lab Results  Component Value Date   WBC 6.9 03/01/2013   HGB 14.0 03/01/2013   HCT 41.1 03/01/2013   MCV 89 03/01/2013   PLT 127* 03/01/2013    Lab Results  Component Value Date   CREATININE 0.86 03/01/2013     Lab Results  Component Value Date   HGBA1C 5.4 03/01/2013    Urinalysis Urine dipstick shows trace blood.  Micro exam: negative for WBC's or RBC's.   Cystoscopy Procedure Note  Patient identification was confirmed, informed consent was obtained, and patient was prepped using Betadine solution.  Lidocaine jelly was administered per urethral meatus.    Preoperative abx where received prior to procedure.     Pre-Procedure: - Inspection reveals a normal caliber ureteral meatus.  Procedure: The flexible cystoscope was introduced without difficulty through ventral hypospadiac urethral meatus - No urethral strictures/lesions are present.   -  Normal prostate  - Normal bladder neck - Bilateral ureteral orifices identified - Bladder mucosa  reveals no ulcers, tumors, or lesions - No bladder stones - No trabeculation  Retroflexion shows unremarkable prostate/ bladder neck.   Post-Procedure: - Patient tolerated the procedure well   Assessment & Plan:    1. Personal history of malignant neoplasm of bladder Cystoscopy today unremarkable. Recommend per AUA guidelines for cystoscopy annually up to 5 years from 2014, his last known tumor. - Urinalysis, Complete - ciprofloxacin (CIPRO) tablet 500 mg; Take 1 tablet (500 mg total) by mouth once. - lidocaine (XYLOCAINE) 2 % jelly 1 application; Place 1 application into the urethra once.  2. Penile hypospadias Unbothered, no intervention.  3. Benign prostatic hypertrophy without urinary obstruction Continue Flomax.  4. Nocturia We had a lengthy discussion today about behavioral modification and avoidance of beverages, particularly caffeinated beverages in the hours prior to going to  bed. Additionally, he does snore and we discussed the physiology of nocturia associated with possible undiagnosed OSA. He will discuss this further with his PCP and consider possibly having a sleep study.   Return in about 1 year (around 10/22/2016) for cystoscopy.  Hollice Espy, MD  [facility] [address] [place], [place] [address] [phone]

## 2015-12-24 ENCOUNTER — Ambulatory Visit: Payer: Medicare Other | Attending: Neurology

## 2015-12-24 DIAGNOSIS — G4761 Periodic limb movement disorder: Secondary | ICD-10-CM | POA: Insufficient documentation

## 2015-12-24 DIAGNOSIS — G4733 Obstructive sleep apnea (adult) (pediatric): Secondary | ICD-10-CM | POA: Insufficient documentation

## 2015-12-24 DIAGNOSIS — G4710 Hypersomnia, unspecified: Secondary | ICD-10-CM | POA: Diagnosis present

## 2016-01-06 ENCOUNTER — Ambulatory Visit: Payer: Medicare Other | Attending: Neurology

## 2016-01-06 DIAGNOSIS — G4733 Obstructive sleep apnea (adult) (pediatric): Secondary | ICD-10-CM | POA: Diagnosis not present

## 2016-01-06 DIAGNOSIS — G4761 Periodic limb movement disorder: Secondary | ICD-10-CM | POA: Insufficient documentation

## 2016-01-20 ENCOUNTER — Other Ambulatory Visit: Payer: Self-pay | Admitting: Urology

## 2016-01-20 DIAGNOSIS — N400 Enlarged prostate without lower urinary tract symptoms: Secondary | ICD-10-CM

## 2016-02-29 ENCOUNTER — Encounter: Payer: Self-pay | Admitting: Physical Therapy

## 2016-02-29 ENCOUNTER — Ambulatory Visit: Payer: Medicare Other | Attending: Family Medicine | Admitting: Physical Therapy

## 2016-02-29 DIAGNOSIS — M5410 Radiculopathy, site unspecified: Secondary | ICD-10-CM | POA: Diagnosis present

## 2016-02-29 DIAGNOSIS — M542 Cervicalgia: Secondary | ICD-10-CM | POA: Diagnosis present

## 2016-02-29 DIAGNOSIS — M436 Torticollis: Secondary | ICD-10-CM | POA: Insufficient documentation

## 2016-02-29 NOTE — Therapy (Signed)
[place] MAIN [facility] SERVICES [address] [place], [place], [address] Phone: [phone]   Fax:  [phone]  Physical Therapy Evaluation  Patient Details  Name: Trevor [name] MRN: [id number] Date of Birth: 1946/05/15 Referring Provider: Dr. Whitney Post  Encounter Date: 02/29/2016      PT End of Session - 02/29/16 1638    Visit Number 1   Number of Visits 13   Date for PT Re-Evaluation 03/28/16   Authorization Type Gcode 1   Authorization Time Period 10   PT Start Time 1540   PT Stop Time 1630   PT Time Calculation (min) 50 min   Activity Tolerance Patient tolerated treatment well;No increased pain   Behavior During Therapy [facility] for tasks assessed/performed      Past Medical History  Diagnosis Date  . Anemia   . Hyperlipidemia   . Low back pain   . Cervicalgia   . Allergic rhinitis   . GERD (gastroesophageal reflux disease)   . Hemorrhoids   . Acne   . Carpal tunnel syndrome   . Anxiety   . Mitral regurgitation   . Urinary frequency   . Sleep apnea   . Hypospadias   . BPH (benign prostatic hypertrophy) with urinary obstruction   . History of malignant neoplasm of bladder   . Hypertension     controlled with medication;   . Depression     controlled;   . Degenerative disc disease, cervical     neck and back;   . Cancer [facility])     bladder CA; remission since Oct 2014    Past Surgical History  Procedure Laterality Date  . Tonsillectomy    . Septoplasty    . Colonoscopy, esophagogastroduodenoscopy (egd) and esophageal dilation    . Flexible sigmoidoscopy    . Colonoscopy with propofol N/A 07/31/2015    Procedure: COLONOSCOPY WITH PROPOFOL;  Surgeon: Manya Silvas, MD;  Location: [facility] ENDOSCOPY;  Service: Endoscopy;  Laterality: N/A;  . Bladder surgery      2011, 2013, 2015    There were no vitals filed for this visit.  Visit Diagnosis:  Neck pain - Plan: PT plan of care cert/re-cert  Radiculopathy affecting  upper extremity - Plan: PT plan of care cert/re-cert  Neck stiffness - Plan: PT plan of care cert/re-cert      Subjective Assessment - 02/29/16 1544    Subjective 70 yo Male reports increased numbness in left hand along 3-5 digits with 5th digit being worst; He reports also having aches/pains down neck and down shoulder/arm; Patient has a history of LUE numbness which first occurred about 5 years ago with good results with resolving of symptoms; He reports that last summer his numbness started coming back on; He reports increased numbness with pressure along left forearm; He reports nothing seems to help relieve symptoms; He reports trying heat and bengay with no results; He is right handed; Patient works with [employer], broadcasting/film/video; 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 1 hour   How long can you stand comfortably? 20 min; limited by back pain;    How long can you walk comfortably? >500 feet; walking  helps with discomfort;    Diagnostic tests none reported;    Currently in Pain? Yes   Pain Score 3    Pain Location Arm   Pain Orientation Left   Pain Descriptors / Indicators Aching;Sore   Pain Type Chronic pain   Pain Radiating Towards left hand;    Pain Onset More than a month ago   Pain Frequency Constant   Aggravating Factors  leaning on arm;    Pain Relieving Factors movement/activity;             [facility] PT Assessment - 02/29/16 0001    Assessment   Medical Diagnosis LUE radiculopathy;    Referring Provider Dr. Whitney Post   Onset Date/Surgical Date 05/13/15   Hand Dominance Right   Next MD Visit June 2017   Prior Therapy had  PT about 5 years ago with good results and resolution of symptoms;    Precautions   Precautions None   Restrictions   Weight Bearing Restrictions No   Balance Screen   Has the patient fallen in the past 6 months No   Has the patient had a decrease in activity level because of a fear of falling?  No   Is the patient reluctant to leave their home because of a fear of falling?  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;    Leisure read, watch TV, watches dramas, westerns, old movies;    Cognition   Overall Cognitive Status Within Functional Limits for tasks assessed   Observation/Other Assessments   Neck Disability Index  10% (minimal disability)   Sensation   Light Touch Appears Intact   Additional Comments has tingling along 3-5 digits but intact light touch and deep pressure;   Coordination   Gross Motor Movements are Fluid and Coordinated Yes   Fine Motor Movements are Fluid and Coordinated Yes   Finger Nose Finger Test accurate bilaterally;   Posture/Postural Control   Posture Comments exhibits moderate slumped posture with rounded shoulders/forward head;    AROM   Overall AROM Comments BUE AROM is [facility]   Cervical Flexion 40   Cervical Extension 40   Cervical - Right Side Bend 35   Cervical - Left Side Bend 25  with pain;   Cervical - Right Rotation 75   Cervical - Left Rotation 75  with pain;   Strength   Overall Strength Comments BUE gross strength is WFL   Right Hand Grip (lbs) 75   Left Hand Grip (lbs) 65   Palpation   Spinal mobility hypomobility noted throughout cervical spine;    Palpation comment denies any tenderness to palpation; has some tightness in  bilateral upper traps; Reports no change in pain with gentle cervical distraction; Has discomfort with suboccipital release;    Special Tests    Special Tests Cervical   Spurling's   Findings Positive   Side Left   Comment with left cervical extension and rotation;    Distraction Test   Findngs Negative   side Left   Transfers   Comments able to transfer sit<>stand independently ;    Ambulation/Gait   Gait Comments ambulates independently; no imbalance noted;  TREATMENT: Initiated HEP: Chin tucks 3 sec hold x5 reps; Posterior shoulder rolls x10 reps with mod VCs for technique including shoulder elevation, retraction then depression; Cervical AROM in all directions x2 reps each with cues to work on increasing ROM to tolerance.  Patient required min-moderate verbal/tactile cues for correct exercise technique.                     PT Education - Mar 01, 2016 1638    Education provided Yes   Education Details HEP- initiated;    Person(s) Educated Patient   Methods Explanation;Verbal cues;Handout   Comprehension Verbalized understanding;Returned demonstration;Verbal cues required;Tactile cues required             PT Long Term Goals - 03-01-2016 1648    PT LONG TERM GOAL #1   Title Patient will be independent in home exercise program to improve strength/mobility for better functional independence with ADLs. by 03/28/16   Time 4   Period Weeks   Status New   PT LONG TERM GOAL #2   Title Patient will report a worst pain of 2/10 on VAS in LUE shoulder/arm  to improve tolerance with ADLs and reduced symptoms with activities. by 03/28/16   Time 4   Period Weeks   Status New   PT LONG TERM GOAL #3   Title Patient will report less numbness in LUE hand to <25% of time for improved tolerance with ADL tasks by 03/28/16   Time 4   Period Weeks   Status New   PT LONG TERM GOAL #4   Title Patient will report no pain with cervical AROM in all directions to improve  tolerance with rotation when driving by [id number]   Time 4   Period Weeks   Status New               Plan - 03/01/2016 1639    Clinical Impression Statement 70 yo Male reports increased numbness in left hand since last summer. Patient has been diagnosed with cervical osteoarthritis with DDD. He reports having LUE radiculopathy symptoms approximately 5 years ago with numbness and tingling. He reports that his symptoms resolved with physical therapy; Patient works full time in [employer], broadcasting/film/video. He is right handed. He exhibits increased numbness in LUE which is exacerbated with cervical compression. He reports no change in symptoms with cervical distraction. Patient tested negative with ULNTTs. He would benefit from additional skilled PT intervention to reduce numbness and improve cervical mobility;    Pt will benefit from skilled therapeutic intervention in order to improve on the following deficits Hypomobility;Impaired sensation;Pain;Improper body mechanics;Decreased range of motion;Postural dysfunction   Rehab Potential Good   Clinical Impairments Affecting Rehab Potential positive: good PLOF, negative: co-morbidities; Patient's clinical presentation is stable as his symptoms are isolated to LUE and do not vary much;    PT Frequency 2x / week   PT Duration 4 weeks   PT Treatment/Interventions ADLs/Self Care Home Management;Cryotherapy;Moist Heat;Traction;Therapeutic exercise;Therapeutic activities;Functional mobility training;Patient/family education;Manual techniques;Taping;Energy conservation;Dry needling;Passive range of motion   PT Next Visit Plan advance HEP, manual therapy consider traction;    PT Home Exercise Plan initiated-see patient instructions;   Consulted and Agree with Plan of Care Patient          G-Codes - 03-01-16 1655    Functional Assessment Tool Used Neck disability index, strength, clinical judgement;    Functional Limitation Carrying, moving and handling  objects   Carrying, Moving and Handling Objects Current Status [id number]) At least  1 percent but less than 20 percent impaired, limited or restricted   Carrying, Moving and Handling Objects Goal Status [id number]) 0 percent impaired, limited or restricted       Problem List Patient Active Problem List   Diagnosis Date Noted  . Nocturia 10/23/2015  . Absolute anemia 05/13/2014  . Benign essential HTN 05/13/2014  . Blood glucose elevated 05/13/2014  . LBP (low back pain) 05/13/2014  . HLD (hyperlipidemia) 05/13/2014  . Malignant neoplasm of lateral wall of urinary bladder ([place]) 09/19/2013  . Benign prostatic hypertrophy without urinary obstruction 12/10/2012  . Hypospadias 12/10/2012  . Microscopic hematuria 12/10/2012  . History of neoplasm of bladder 12/10/2012  . FOM (frequency of micturition) 12/10/2012    Trotter,Margaret PT, DPT 02/29/2016, 4:57 PM   [place] MAIN [facility] SERVICES [address] [place], [place], [address] Phone: [phone]   Fax:  [phone]  Name: KHUONG CRISCIONE MRN: [id number] Date of Birth: May 30, 1946

## 2016-02-29 NOTE — Patient Instructions (Signed)
  Head Rotation   Slowly turn head to one side, then the other. Hold _3__ seconds. Repeat _10__ times each side, alternating. Do __2_ sessions per day.  Copyright  VHI. All rights reserved.  Neck Side-Bending   Begin with chin level, head centered over spine. Slowly lower one ear toward shoulder. Hold __3__ seconds. Slowly return to starting position. Repeat to other side. Repeat __10__ times to each side.  Copyright  VHI. All rights reserved.    Copyright  VHI. All rights reserved.  Neck Flexion   Begin with chin level, head centered over spine. Slowly lower chin toward chest. Hold _3___ seconds. Slowly return to starting position. Repeat __10__ times.  Copyright  VHI. All rights reserved.  AROM: Neck Extension   Bend head backward. Hold _3___ seconds. Repeat _10___ times per set. Do __1__ sets per session. Do _2__ sessions per day.  http://orth.exer.us/301    Neck: Retraction   Sit with back and head straight. Pull chin back to line up ear with shoulder. Do not turn or tilt head. You can use your hand to help if needed. Hold _5___ seconds. Repeat _10___ times. Do __2__ sessions per day. CAUTION: Movement should be gentle, steady and slow.    Copyright  VHI. All rights reserved.   Roll   Inhale and bring shoulders up, back, then exhale and relax shoulders down. Repeat _10__ times. Do _2-3__ times per day.  Copyright  VHI. All rights reserved.

## 2016-03-02 ENCOUNTER — Ambulatory Visit: Payer: Medicare Other | Admitting: Physical Therapy

## 2016-03-02 ENCOUNTER — Encounter: Payer: Self-pay | Admitting: Physical Therapy

## 2016-03-02 DIAGNOSIS — M542 Cervicalgia: Secondary | ICD-10-CM

## 2016-03-02 DIAGNOSIS — M5410 Radiculopathy, site unspecified: Secondary | ICD-10-CM

## 2016-03-02 DIAGNOSIS — M436 Torticollis: Secondary | ICD-10-CM

## 2016-03-03 NOTE — Therapy (Signed)
[place] MAIN [facility] SERVICES [address] [place], [place], [address] Phone: [phone]   Fax:  [phone]  Physical Therapy Treatment  Patient Details  Name: Trevor [name] MRN: [id number] Date of Birth: 1946/10/26 Referring Provider: Dr. Whitney Post  Encounter Date: 03/02/2016      PT End of Session - 03/02/16 1640    Visit Number 2   Number of Visits 13   Date for PT Re-Evaluation 03/28/16   Authorization Type Gcode 2   Authorization Time Period 10   PT Start Time 1600   PT Stop Time [id number]   PT Time Calculation (min) 45 min   Activity Tolerance Patient tolerated treatment well;No increased pain   Behavior During Therapy [facility] for tasks assessed/performed      Past Medical History  Diagnosis Date  . Anemia   . Hyperlipidemia   . Low back pain   . Cervicalgia   . Allergic rhinitis   . GERD (gastroesophageal reflux disease)   . Hemorrhoids   . Acne   . Carpal tunnel syndrome   . Anxiety   . Mitral regurgitation   . Urinary frequency   . Sleep apnea   . Hypospadias   . BPH (benign prostatic hypertrophy) with urinary obstruction   . History of malignant neoplasm of bladder   . Hypertension     controlled with medication;   . Depression     controlled;   . Degenerative disc disease, cervical     neck and back;   . Cancer [facility])     bladder CA; remission since Oct 2014    Past Surgical History  Procedure Laterality Date  . Tonsillectomy    . Septoplasty    . Colonoscopy, esophagogastroduodenoscopy (egd) and esophageal dilation    . Flexible sigmoidoscopy    . Colonoscopy with propofol N/A 07/31/2015    Procedure: COLONOSCOPY WITH PROPOFOL;  Surgeon: Manya Silvas, MD;  Location: [facility] ENDOSCOPY;  Service: Endoscopy;  Laterality: N/A;  . Bladder surgery      2011, 2013, 2015    There were no vitals filed for this visit.  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 1 hour   How long can you stand comfortably? 20 min; limited by back pain;    How long can you walk comfortably? >500 feet; walking helps with discomfort;    Diagnostic tests none reported;    Currently in Pain? No/denies   Pain Onset More than a month ago        TREATMENT: Prior to traction Patient prone: PT performed grade II-III PA mobs along C6, C7, T1, T2, T3, T4 10 sec bouts x3 each; Patient reports pain along thoracic mobs but denies any increase in radicular symptoms; Patient supine: Suboccipital release concurrent with chin tucks 2 sec hold x10 reps; PT performed gentle cervical distraction with patient supine: 10 sec hold, 10 sec rest x6 min with patient reporting less numbness in left hand to only 20%;   Following manual therapy: PT applied cervical mechanical traction, 15 sec hold, 10 sec rest,  20 pounds on, 15 pounds off x10 min; Patient reports less numbness in LUE with no numbness in 3rd digit and minimal numbness in 4-5th digits;   Standing: Doorway stretch 15 sec hold x2 reps with min VCS for hand placement; Posterior shoulder rolls x10 reps;  Patient reports less numbness in LUE hand overall by about 50% following treatment session; Recommended patient continue with HEP for better tissue extensibility;                           PT Education - 03/02/16 1639    Education provided Yes   Education Details traction, manual therapy;    Person(s) Educated Patient   Methods Explanation;Verbal cues   Comprehension Verbalized understanding;Returned demonstration;Verbal cues  required             PT Long Term Goals - 02/29/16 1648    PT LONG TERM GOAL #1   Title Patient will be independent in home exercise program to improve strength/mobility for better functional independence with ADLs. by 03/28/16   Time 4   Period Weeks   Status New   PT LONG TERM GOAL #2   Title Patient will report a worst pain of 2/10 on VAS in LUE shoulder/arm  to improve tolerance with ADLs and reduced symptoms with activities. by 03/28/16   Time 4   Period Weeks   Status New   PT LONG TERM GOAL #3   Title Patient will report less numbness in LUE hand to <25% of time for improved tolerance with ADL tasks by 03/28/16   Time 4   Period Weeks   Status New   PT LONG TERM GOAL #4   Title Patient will report no pain with cervical AROM in all directions to improve tolerance with rotation when driving by [id number]   Time 4   Period Weeks   Status New               Plan - 03/03/16 1713    Clinical Impression Statement Patient presents with continued numbness in LUE. PT performed extensive manual therapy including joint mobs to faciliate better cervical spinal mobility.  Patient reports less numbness in UE following manual therapy and traction. He would benefit from additional skilled PT intervention to reduce numbness and reduce neck pain;    Pt will benefit from skilled therapeutic intervention in order to improve on the following deficits Hypomobility;Impaired sensation;Pain;Improper body mechanics;Decreased range of motion;Postural dysfunction   Rehab Potential Good   Clinical Impairments Affecting Rehab Potential positive: good PLOF, negative: co-morbidities; Patient's clinical presentation is stable as his symptoms are isolated to LUE and do not vary much;    PT Frequency 2x / week   PT Duration 4 weeks   PT Treatment/Interventions ADLs/Self Care Home Management;Cryotherapy;Moist Heat;Traction;Therapeutic exercise;Therapeutic activities;Functional mobility  training;Patient/family education;Manual techniques;Taping;Energy conservation;Dry needling;Passive range of motion   PT Next Visit Plan advance HEP, manual therapy consider traction;    PT Home Exercise Plan advanced, see patient instructions;   Consulted and Agree with Plan of Care Patient        Problem List Patient Active Problem List   Diagnosis Date Noted  . Nocturia 10/23/2015  . Absolute anemia 05/13/2014  . Benign essential HTN 05/13/2014  . Blood glucose elevated 05/13/2014  . LBP (low back pain) 05/13/2014  . HLD (hyperlipidemia) 05/13/2014  . Malignant neoplasm of lateral wall of urinary bladder ([place]) 09/19/2013  . Benign prostatic hypertrophy without urinary obstruction 12/10/2012  .  Hypospadias 12/10/2012  . Microscopic hematuria 12/10/2012  . History of neoplasm of bladder 12/10/2012  . FOM (frequency of micturition) 12/10/2012    Piercen Covino PT, DPT 03/03/2016, 5:37 PM  [place] MAIN [facility] SERVICES [address] [place], [place], [address] Phone: [phone]   Fax:  [phone]  Name: MICHAELRYAN MOESSNER MRN: [id number] Date of Birth: 09-02-1946

## 2016-03-07 ENCOUNTER — Ambulatory Visit: Payer: Medicare Other | Admitting: Physical Therapy

## 2016-03-07 ENCOUNTER — Encounter: Payer: Self-pay | Admitting: Physical Therapy

## 2016-03-07 DIAGNOSIS — M542 Cervicalgia: Secondary | ICD-10-CM | POA: Diagnosis not present

## 2016-03-07 DIAGNOSIS — M436 Torticollis: Secondary | ICD-10-CM

## 2016-03-07 DIAGNOSIS — M5410 Radiculopathy, site unspecified: Secondary | ICD-10-CM

## 2016-03-07 NOTE — Therapy (Signed)
[place] MAIN [facility] SERVICES [address] [place], [place], [address] Phone: [phone]   Fax:  [phone]  Physical Therapy Treatment  Patient Details  Name: Trevor [name] MRN: [id number] Date of Birth: 1946-10-11 Referring Provider: Dr. Whitney Post  Encounter Date: 03/07/2016      PT End of Session - 03/07/16 1618    Visit Number 3   Number of Visits 13   Date for PT Re-Evaluation 03/28/16   Authorization Type Gcode 3   Authorization Time Period 10   PT Start Time 1600   PT Stop Time 1645   PT Time Calculation (min) 45 min   Activity Tolerance Patient tolerated treatment well;No increased pain   Behavior During Therapy [facility] for tasks assessed/performed      Past Medical History  Diagnosis Date  . Anemia   . Hyperlipidemia   . Low back pain   . Cervicalgia   . Allergic rhinitis   . GERD (gastroesophageal reflux disease)   . Hemorrhoids   . Acne   . Carpal tunnel syndrome   . Anxiety   . Mitral regurgitation   . Urinary frequency   . Sleep apnea   . Hypospadias   . BPH (benign prostatic hypertrophy) with urinary obstruction   . History of malignant neoplasm of bladder   . Hypertension     controlled with medication;   . Depression     controlled;   . Degenerative disc disease, cervical     neck and back;   . Cancer [facility])     bladder CA; remission since Oct 2014    Past Surgical History  Procedure Laterality Date  . Tonsillectomy    . Septoplasty    . Colonoscopy, esophagogastroduodenoscopy (egd) and esophageal dilation    . Flexible sigmoidoscopy    . Colonoscopy with propofol N/A 07/31/2015    Procedure: COLONOSCOPY WITH PROPOFOL;  Surgeon: Manya Silvas, MD;  Location: [facility] ENDOSCOPY;  Service: Endoscopy;  Laterality: N/A;  . Bladder surgery      2011, 2013, 2015    There were no vitals filed for this visit.  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 1 hour   How long can you stand comfortably? 20 min; limited by back pain;    How long can you walk comfortably? >500 feet; walking helps with discomfort;    Diagnostic tests none reported;    Currently in Pain? No/denies   Pain Onset More than a month ago        TREATMENT:  PT applied cervical mechanical traction 20# pull, 15# rest, 20 sec pull, 10 sec rest x20 min with patient in hooklying position;  He tolerated well with less numbness reported in left fingers; He continues to have numbness in 5th digit but no numbness in 3rd and 4th following traction;  PT instructed patient in cervical stretches: Scalene stretch 15 sec hold x2 each side; Patent required max verbal cues and demonstration to improve chin tuck with stretch for better scalene  extensibility; Ulnar nerve flossing x5 reps, x2 sets with mod VCs for UE positioning to improve nerve stretch;  Patient also required min Vcs for positioning with doorway stretch 15 sec hold x2 reps; Patient instructed to avoid excessive shoulder flexion for less compression;  Patient required min-moderate verbal/tactile cues for correct exercise technique.                           PT Education - 03/07/16 1618    Education provided Yes   Education Details traction, HEP   Person(s) Educated Patient   Methods Explanation;Verbal cues   Comprehension Verbalized understanding;Returned demonstration;Verbal cues required             PT Long Term Goals - 02/29/16 1648    PT LONG TERM GOAL #1   Title  Patient will be independent in home exercise program to improve strength/mobility for better functional independence with ADLs. by 03/28/16   Time 4   Period Weeks   Status New   PT LONG TERM GOAL #2   Title Patient will report a worst pain of 2/10 on VAS in LUE shoulder/arm  to improve tolerance with ADLs and reduced symptoms with activities. by 03/28/16   Time 4   Period Weeks   Status New   PT LONG TERM GOAL #3   Title Patient will report less numbness in LUE hand to <25% of time for improved tolerance with ADL tasks by 03/28/16   Time 4   Period Weeks   Status New   PT LONG TERM GOAL #4   Title Patient will report no pain with cervical AROM in all directions to improve tolerance with rotation when driving by [id number]   Time 4   Period Weeks   Status New               Plan - 03/07/16 1619    Clinical Impression Statement PT applied cervical mechanical traction at patient's tolerance. He responds well with less numbness/tingling in left hand; He reports no change in left 5th finger with traction but has no numbness in 3rd and 4th finger. PT educated patient in advanced stretches to improve cervical ROM and reduce pain; Patient would benefit from additional skilled PT intervention to improve cervical ROM and reduce pain and numbness down UE;    Pt will benefit from skilled therapeutic intervention in order to improve on the following deficits Hypomobility;Impaired sensation;Pain;Improper body mechanics;Decreased range of motion;Postural dysfunction   Rehab Potential Good   Clinical Impairments Affecting Rehab Potential positive: good PLOF, negative: co-morbidities; Patient's clinical presentation is stable as his symptoms are isolated to LUE and do not vary much;    PT Frequency 2x / week   PT Duration 4 weeks   PT Treatment/Interventions ADLs/Self Care Home Management;Cryotherapy;Moist Heat;Traction;Therapeutic exercise;Therapeutic activities;Functional mobility  training;Patient/family education;Manual techniques;Taping;Energy conservation;Dry needling;Passive range of motion   PT Next Visit Plan advance HEP, manual therapy consider traction;    PT Home Exercise Plan advanced, see patient instructions;   Consulted and Agree with Plan of Care Patient        Problem List Patient Active Problem List   Diagnosis Date Noted  . Nocturia 10/23/2015  . Absolute anemia 05/13/2014  . Benign essential HTN 05/13/2014  . Blood glucose elevated 05/13/2014  . LBP (low back pain) 05/13/2014  . HLD (hyperlipidemia) 05/13/2014  . Malignant neoplasm of lateral wall of urinary bladder ([place]) 09/19/2013  . Benign prostatic hypertrophy without urinary obstruction  12/10/2012  . Hypospadias 12/10/2012  . Microscopic hematuria 12/10/2012  . History of neoplasm of bladder 12/10/2012  . FOM (frequency of micturition) 12/10/2012    Nikkole Placzek PT, DPT 03/08/2016, 10:13 AM  [place] MAIN [facility] SERVICES [address] [place], [place], [address] Phone: [phone]   Fax:  [phone]  Name: WELCOME PAPALE MRN: [id number] Date of Birth: 08-Nov-1946

## 2016-03-07 NOTE — Patient Instructions (Addendum)
Flexors, Sitting    Sitting in chair, Hold left arm down towards bottom of chair, Then tilt head to right side, keeping chin tucked, look up to left side towards ceiling. Hold _15__ seconds. Repeat _2-3__ times per session. Do _2__ sessions per day.  Copyright  VHI. All rights reserved.  Neurovascular: Ulnar Nerve Glide With Cervical Bias - Supine    Lie with neck supported, left arm out to side, elbow bent to pain-free position, palm out. Then slowly move opposite side ear toward shoulder as far as possible without pain. Repeat _5___ times per set. Do __2-3__ sets per session. Do __5__ sessions per week.  You can also do that stretch sitting or standing;   CHEST: Doorway, Bilateral - Standing    Standing in doorway, place hands on wall with elbows bent at shoulder height. Lean forward. Hold _15__ seconds. _2__ reps per set, _2__ sets per day, _5__ days per week  Copyright  VHI. All rights reserved.

## 2016-03-09 ENCOUNTER — Encounter: Payer: Self-pay | Admitting: Physical Therapy

## 2016-03-09 ENCOUNTER — Ambulatory Visit: Payer: Medicare Other | Admitting: Physical Therapy

## 2016-03-09 DIAGNOSIS — M5410 Radiculopathy, site unspecified: Secondary | ICD-10-CM

## 2016-03-09 DIAGNOSIS — M542 Cervicalgia: Secondary | ICD-10-CM | POA: Diagnosis not present

## 2016-03-09 DIAGNOSIS — M436 Torticollis: Secondary | ICD-10-CM

## 2016-03-09 NOTE — Therapy (Signed)
[place] MAIN [facility] SERVICES [address] [place], [place], [address] Phone: [phone]   Fax:  [phone]  Physical Therapy Treatment  Patient Details  Name: Trevor [name] MRN: [id number] Date of Birth: 11-Sep-1946 Referring Provider: Dr. Whitney Post  Encounter Date: 03/09/2016      PT End of Session - 03/09/16 1622    Visit Number 4   Number of Visits 13   Date for PT Re-Evaluation 03/28/16   Authorization Type Gcode 4   Authorization Time Period 10   PT Start Time 1600   PT Stop Time 1645   PT Time Calculation (min) 45 min   Activity Tolerance Patient tolerated treatment well;No increased pain   Behavior During Therapy [facility] for tasks assessed/performed      Past Medical History  Diagnosis Date  . Anemia   . Hyperlipidemia   . Low back pain   . Cervicalgia   . Allergic rhinitis   . GERD (gastroesophageal reflux disease)   . Hemorrhoids   . Acne   . Carpal tunnel syndrome   . Anxiety   . Mitral regurgitation   . Urinary frequency   . Sleep apnea   . Hypospadias   . BPH (benign prostatic hypertrophy) with urinary obstruction   . History of malignant neoplasm of bladder   . Hypertension     controlled with medication;   . Depression     controlled;   . Degenerative disc disease, cervical     neck and back;   . Cancer [facility])     bladder CA; remission since Oct 2014    Past Surgical History  Procedure Laterality Date  . Tonsillectomy    . Septoplasty    . Colonoscopy, esophagogastroduodenoscopy (egd) and esophageal dilation    . Flexible sigmoidoscopy    . Colonoscopy with propofol N/A 07/31/2015    Procedure: COLONOSCOPY WITH PROPOFOL;  Surgeon: Manya Silvas, MD;  Location: [facility] ENDOSCOPY;  Service: Endoscopy;  Laterality: N/A;  . Bladder surgery      2011, 2013, 2015    There were no vitals filed for this visit.  Visit Diagnosis:  Neck pain  Radiculopathy affecting upper extremity  Neck stiffness       Subjective Assessment - 03/09/16 1618    Subjective Patient reports no tingling or numbness in his left middle finger. 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 1 hour   How long can you stand comfortably? 20 min; limited by back pain;    How long can you walk comfortably? >500 feet; walking helps with discomfort;    Diagnostic tests none reported;    Currently in Pain? No/denies   Pain Onset More than a month ago         TREATMENT: Prior to traction: PT had patient prone; PT performed grade II-III PA mobs along C7, T1, T2, T3, T4 10 sec bouts x3 each; Patient reports increased tenderness with palpation along spinous process but not severe pain; He did have some tingling in LUE with PA mobs along C7, T1; Patient exhibits improved mobility with less tightness after PA mobs;  After manual therapy: PT put patient on cervical mechanical  traction, supine with head approximately 10 degrees of flexion/almost neutral; 20# pull, 15# rest hold, 20 sec on, 15 sec off x20 min; Patient had less tingling in 4th digit but continues to have numbness in 5th digit following traction;   PT re-educated patient in HEP including ulnar glides with LUE; Patient required mod VCs for correct hand positioning with lateral head tilt x5 reps; Patient verbalized independence with doorway stretches and other cervical stretches;                         PT Education - 03/09/16 1622    Education provided Yes   Education Details traction positioning, HEP   Person(s) Educated Patient   Methods Explanation;Verbal cues   Comprehension Verbalized understanding;Returned demonstration;Verbal  cues required             PT Long Term Goals - 02/29/16 1648    PT LONG TERM GOAL #1   Title Patient will be independent in home exercise program to improve strength/mobility for better functional independence with ADLs. by 03/28/16   Time 4   Period Weeks   Status New   PT LONG TERM GOAL #2   Title Patient will report a worst pain of 2/10 on VAS in LUE shoulder/arm  to improve tolerance with ADLs and reduced symptoms with activities. by 03/28/16   Time 4   Period Weeks   Status New   PT LONG TERM GOAL #3   Title Patient will report less numbness in LUE hand to <25% of time for improved tolerance with ADL tasks by 03/28/16   Time 4   Period Weeks   Status New   PT LONG TERM GOAL #4   Title Patient will report no pain with cervical AROM in all directions to improve tolerance with rotation when driving by [id number]   Time 4   Period Weeks   Status New               Plan - 03/09/16 1626    Clinical Impression Statement Patient seems to be responding well to mechanical traction with no numbness reported in 3rd finger and only minimal numbness in 4th finger; However, he continues to have full numbness and tingling throughout 5th digit. Patient instructed in ulnar nerve glides and other postural strengthening exercise for better positioning; He reports less stiffness following manual therapy; He would benefit from additional skilled PT intervention to improve posture and reduce numbness/tingling;    Pt will benefit from skilled therapeutic intervention in order to improve on the following deficits Hypomobility;Impaired sensation;Pain;Improper body mechanics;Decreased range of motion;Postural dysfunction   Rehab Potential Good   Clinical Impairments Affecting Rehab Potential positive: good PLOF, negative: co-morbidities; Patient's clinical presentation is stable as his symptoms are isolated to LUE and do not vary much;    PT Frequency 2x / week   PT Duration 4 weeks   PT  Treatment/Interventions ADLs/Self Care Home Management;Cryotherapy;Moist Heat;Traction;Therapeutic exercise;Therapeutic activities;Functional mobility training;Patient/family education;Manual techniques;Taping;Energy conservation;Dry needling;Passive range of motion   PT Next Visit Plan advance HEP, manual therapy consider traction;    PT Home Exercise Plan continue as previously given;    Consulted and Agree with Plan of Care Patient        Problem List Patient Active Problem List   Diagnosis Date Noted  . Nocturia 10/23/2015  . Absolute anemia 05/13/2014  . Benign essential HTN 05/13/2014  . Blood glucose elevated 05/13/2014  . LBP (low back pain) 05/13/2014  .  HLD (hyperlipidemia) 05/13/2014  . Malignant neoplasm of lateral wall of urinary bladder ([place]) 09/19/2013  . Benign prostatic hypertrophy without urinary obstruction 12/10/2012  . Hypospadias 12/10/2012  . Microscopic hematuria 12/10/2012  . History of neoplasm of bladder 12/10/2012  . FOM (frequency of micturition) 12/10/2012    Cambry Spampinato PT, DPT 03/09/2016, 5:37 PM  [place] MAIN [facility] SERVICES [address] [place], [place], [address] Phone: [phone]   Fax:  [phone]  Name: Trevor [name] MRN: [id number] Date of Birth: January 28, 1946

## 2016-03-14 ENCOUNTER — Ambulatory Visit: Payer: Medicare Other | Attending: Family Medicine | Admitting: Physical Therapy

## 2016-03-14 ENCOUNTER — Encounter: Payer: Self-pay | Admitting: Physical Therapy

## 2016-03-14 DIAGNOSIS — M5410 Radiculopathy, site unspecified: Secondary | ICD-10-CM | POA: Insufficient documentation

## 2016-03-14 DIAGNOSIS — M542 Cervicalgia: Secondary | ICD-10-CM | POA: Diagnosis present

## 2016-03-14 DIAGNOSIS — M436 Torticollis: Secondary | ICD-10-CM | POA: Diagnosis present

## 2016-03-14 NOTE — Patient Instructions (Addendum)
      Progressive Resisted: Extension (Prone)    Holding _2___ pound weights, arms back, raise arms from floor, keeping elbows straight. Repeat _10___ times per set. Do _2___ sets per session. Do _2___ sessions per day.  http://orth.exer.us/872   Copyright  VHI. All rights reserved.  Strengthening: Horizontal Abduction - with Interior Rotation (Prone)    Holding __2__ pound weights, raise arms out from sides, pinching shoulder blades. Keep elbows straight, thumbs down. Repeat 10____ times per set. Do ___2_ sets per session. Do ___2_ sessions per day.  http://orth.exer.us/896   Copyright  VHI. All rights reserved.  Scapular: Retraction (Prone)    Holding __2__ pound weights, keep arms out from sides and elbows bent. Pull elbows back, pinching shoulder blades together. Repeat __10__ times per set. Do ___2_ sets per session. Do __2__ sessions per day.  http://orth.exer.us/862   Copyright  VHI. All rights reserved.  Scapular: Flexion (Prone)    Holding __2__ pound weights, raise both arms forward (alternate lifting one arm up and then the other) AVOID PAINFUL RANGE OF MOTION Keep elbows straight. Repeat _10___ times per set. Do __2__ sets per session. Do _2___ sessions per day.  http://orth.exer.us/860   Copyright  VHI. All rights reserved.

## 2016-03-14 NOTE — Therapy (Signed)
[place] MAIN [facility] SERVICES [address] [place], [place], [address] Phone: [phone]   Fax:  [phone]  Physical Therapy Treatment  Patient Details  Name: Trevor [name] MRN: [id number] Date of Birth: [birth year] Referring Provider: Dr. Whitney Post  Encounter Date: 03/14/2016      PT End of Session - 03/14/16 1644    Visit Number 5   Number of Visits 13   Date for PT Re-Evaluation 03/28/16   Authorization Type Gcode 5   Authorization Time Period 10   PT Start Time 1600   PT Stop Time 1640   PT Time Calculation (min) 40 min   Activity Tolerance Patient tolerated treatment well;No increased pain   Behavior During Therapy [facility] for tasks assessed/performed      Past Medical History  Diagnosis Date  . Anemia   . Hyperlipidemia   . Low back pain   . Cervicalgia   . Allergic rhinitis   . GERD (gastroesophageal reflux disease)   . Hemorrhoids   . Acne   . Carpal tunnel syndrome   . Anxiety   . Mitral regurgitation   . Urinary frequency   . Sleep apnea   . Hypospadias   . BPH (benign prostatic hypertrophy) with urinary obstruction   . History of malignant neoplasm of bladder   . Hypertension     controlled with medication;   . Depression     controlled;   . Degenerative disc disease, cervical     neck and back;   . Cancer [facility])     bladder CA; remission since Oct 2014    Past Surgical History  Procedure Laterality Date  . Tonsillectomy    . Septoplasty    . Colonoscopy, esophagogastroduodenoscopy (egd) and esophageal dilation    . Flexible sigmoidoscopy    . Colonoscopy with propofol N/A 07/31/2015    Procedure: COLONOSCOPY WITH PROPOFOL;  Surgeon: Manya Silvas, MD;  Location: [facility] ENDOSCOPY;  Service: Endoscopy;  Laterality: N/A;  . Bladder surgery      2011, 2013, 2015    There were no vitals filed for this visit.  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 1 hour   How long can you stand comfortably? 20 min; limited by back pain;    How long can you walk comfortably? >500 feet; walking helps with discomfort;    Diagnostic tests none reported;    Currently in Pain? No/denies   Pain Onset More than a month ago          TREATMENT: Prior to traction: PT had patient prone; PT performed grade II-III PA mobs along C7, T1, T2, T3, T4 10 sec bouts x3 each; Patient reports increased tenderness with palpation along spinous process but not severe pain; He did have some tingling in LUE with PA mobs along C7, T1; Patient exhibits improved mobility with less tightness after PA mobs;  Prone: BUE shoulder extension 2# x10; BUE horizontal abduction 2# x10; BUE mid row 2# x10; Alternate UE lift 2# x10 each; Patient required min-moderate verbal/tactile cues for correct exercise technique  including to increase scapular retraction;   After manual therapy/exercise: PT put patient on cervical mechanical traction, supine with head approximately 10 degrees of flexion/almost neutral; 25# pull, 15# rest hold, 15 sec on, 10 sec off x15 min; Patient had less tingling in 4th digit but continues to have numbness in 5th digit following traction;                           PT Education - 03/14/16 1644    Education provided Yes   Education Details HEP, strengthening   Person(s) Educated Patient   Methods Explanation;Verbal cues   Comprehension Verbalized understanding;Returned demonstration;Verbal cues required             PT Long Term Goals - 02/29/16 1648    PT LONG TERM GOAL #1    Title Patient will be independent in home exercise program to improve strength/mobility for better functional independence with ADLs. by 03/28/16   Time 4   Period Weeks   Status New   PT LONG TERM GOAL #2   Title Patient will report a worst pain of 2/10 on VAS in LUE shoulder/arm  to improve tolerance with ADLs and reduced symptoms with activities. by 03/28/16   Time 4   Period Weeks   Status New   PT LONG TERM GOAL #3   Title Patient will report less numbness in LUE hand to <25% of time for improved tolerance with ADL tasks by 03/28/16   Time 4   Period Weeks   Status New   PT LONG TERM GOAL #4   Title Patient will report no pain with cervical AROM in all directions to improve tolerance with rotation when driving by [id number]   Time 4   Period Weeks   Status New               Plan - 03/14/16 1644    Clinical Impression Statement Patient seems to be responding well to exercise and traction. He has minimal to no numbness/tingling in 3rd and 4th digit. He continues to have numbness/tingling in 5th digit which has not responded much to traction. Advanced HEP with postural strengthening. He would benefit from additional skilled PT intervention to improve posture, reduce neck pain and reduce tingling in UE;    Pt will benefit from skilled therapeutic intervention in order to improve on the following deficits Hypomobility;Impaired sensation;Pain;Improper body mechanics;Decreased range of motion;Postural dysfunction   Rehab Potential Good   Clinical Impairments Affecting Rehab Potential positive: good PLOF, negative: co-morbidities; Patient's clinical presentation is stable as his symptoms are isolated to LUE and do not vary much;    PT Frequency 2x / week   PT Duration 4 weeks   PT Treatment/Interventions ADLs/Self Care Home Management;Cryotherapy;Moist Heat;Traction;Therapeutic exercise;Therapeutic activities;Functional mobility training;Patient/family education;Manual  techniques;Taping;Energy conservation;Dry needling;Passive range of motion   PT Next Visit Plan advance HEP, manual therapy consider traction;    PT Home Exercise Plan continue as previously given;    Consulted and Agree with Plan of Care Patient        Problem List Patient Active Problem List   Diagnosis Date Noted  . Nocturia 10/23/2015  . Absolute anemia 05/13/2014  . Benign essential HTN 05/13/2014  . Blood glucose elevated 05/13/2014  . LBP (low back pain) 05/13/2014  . HLD (hyperlipidemia) 05/13/2014  . Malignant neoplasm of lateral wall of urinary bladder ([place]) 09/19/2013  . Benign prostatic hypertrophy without urinary obstruction 12/10/2012  . Hypospadias 12/10/2012  .  Microscopic hematuria 12/10/2012  . History of neoplasm of bladder 12/10/2012  . FOM (frequency of micturition) 12/10/2012    Krisa Blattner PT, DPT 03/14/2016, 4:46 PM  [place] MAIN [facility] SERVICES [address] [place], [place], [address] Phone: [phone]   Fax:  [phone]  Name: ZILDJIAN GANDIA MRN: [id number] Date of Birth: [birth year]

## 2016-03-16 ENCOUNTER — Encounter: Payer: Self-pay | Admitting: Physical Therapy

## 2016-03-16 ENCOUNTER — Ambulatory Visit: Payer: Medicare Other | Admitting: Physical Therapy

## 2016-03-16 DIAGNOSIS — M542 Cervicalgia: Secondary | ICD-10-CM

## 2016-03-16 DIAGNOSIS — M5410 Radiculopathy, site unspecified: Secondary | ICD-10-CM

## 2016-03-16 DIAGNOSIS — M436 Torticollis: Secondary | ICD-10-CM

## 2016-03-16 NOTE — Therapy (Signed)
[place] MAIN [facility] SERVICES [address] [place], [place], [address] Phone: [phone]   Fax:  [phone]  Physical Therapy Treatment  Patient Details  Name: Trevor [name] MRN: [id number] Date of Birth: 05/02/1946 Referring Provider: Dr. Whitney Post  Encounter Date: 03/16/2016      PT End of Session - 03/16/16 1635    Visit Number 6   Number of Visits 13   Date for PT Re-Evaluation 03/28/16   Authorization Type Gcode 6   Authorization Time Period 10   PT Start Time 1600   PT Stop Time 1645   PT Time Calculation (min) 45 min   Activity Tolerance Patient tolerated treatment well;No increased pain   Behavior During Therapy [facility] for tasks assessed/performed      Past Medical History  Diagnosis Date  . Anemia   . Hyperlipidemia   . Low back pain   . Cervicalgia   . Allergic rhinitis   . GERD (gastroesophageal reflux disease)   . Hemorrhoids   . Acne   . Carpal tunnel syndrome   . Anxiety   . Mitral regurgitation   . Urinary frequency   . Sleep apnea   . Hypospadias   . BPH (benign prostatic hypertrophy) with urinary obstruction   . History of malignant neoplasm of bladder   . Hypertension     controlled with medication;   . Depression     controlled;   . Degenerative disc disease, cervical     neck and back;   . Cancer [facility])     bladder CA; remission since Oct 2014    Past Surgical History  Procedure Laterality Date  . Tonsillectomy    . Septoplasty    . Colonoscopy, esophagogastroduodenoscopy (egd) and esophageal dilation    . Flexible sigmoidoscopy    . Colonoscopy with propofol N/A 07/31/2015    Procedure: COLONOSCOPY WITH PROPOFOL;  Surgeon: Manya Silvas, MD;  Location: [facility] ENDOSCOPY;  Service: Endoscopy;  Laterality: N/A;  . Bladder surgery      2011, 2013, 2015    There were no vitals filed for this visit.  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 1 hour   How long can you stand comfortably? 20 min; limited by back pain;    How long can you walk comfortably? >500 feet; walking helps with discomfort;    Diagnostic tests none reported;    Currently in Pain? No/denies   Pain Onset More than a month ago        TREATMENT: Prior to traction: Supine over 1/2 bolster (parallel to spine) x2 min for increased thoracic extension;  PT had patient prone; PT performed grade II-III PA mobs along C7, T1, T2, T3, T4 10 sec bouts x3 each; Patient reports increased tenderness with palpation along spinous process but not severe pain;  Prone: BUE shoulder extension 2# x12; BUE low row 2# x12;  BUE mid row 2# x12;  Qped: Cat/camel stretch 5 sec hold x5 with mod VCs to relax arms and arch back for better  lumbopelvic mobility; Child's pose 10 sec hold x2;  Patient reports less back discomfort with stretches; He did have difficulty exhibiting lumbopelvic mobility with qped exercise;   Sitting: Cervical extension with towel pull to facilitate better cervical lordosis x10 reps;  Patient required min-moderate verbal/tactile cues for correct exercise technique including to increase scapular retraction with UE strengthening for better posture;   After manual therapy/exercise: PT put patient on cervical mechanical traction, supine with head approximately  Neutral position; 30# pull, 20# rest hold, 15 sec on, 10 sec off x15 min; Patient had less tingling in 4th digit but continues to have numbness in 5th digit following traction;                              PT Education - 03/16/16 1634    Education provided Yes   Education Details postural strengthening, traction   Person(s) Educated Patient   Methods Explanation;Verbal cues   Comprehension Verbalized understanding;Returned demonstration;Verbal cues required             PT Long Term Goals - 02/29/16 1648    PT LONG TERM GOAL #1   Title Patient will be independent in home exercise program to improve strength/mobility for better functional independence with ADLs. by 03/28/16   Time 4   Period Weeks   Status New   PT LONG TERM GOAL #2   Title Patient will report a worst pain of 2/10 on VAS in LUE shoulder/arm  to improve tolerance with ADLs and reduced symptoms with activities. by 03/28/16   Time 4   Period Weeks   Status New   PT LONG TERM GOAL #3   Title Patient will report less numbness in LUE hand to <25% of time for improved tolerance with ADL tasks by 03/28/16   Time 4   Period Weeks   Status New   PT LONG TERM GOAL #4   Title Patient will report no pain with cervical AROM in all directions to improve tolerance with rotation when driving by [id number]   Time 4   Period Weeks   Status New               Plan - 03/16/16 1635    Clinical Impression Statement Advanced postural strengthening and thoracic stretches for better flexibility. Patient reports less stiffness after exercise/manual therapy; He continues to have tingling in LUE 5th digit; Increased cervical traction to 30# with increased pulling reported towards lower cervical/thoracic spine. He reports no significant change with traction for less tingling;    Pt will benefit from skilled therapeutic intervention in order to improve on the following deficits Hypomobility;Impaired sensation;Pain;Improper body mechanics;Decreased range of motion;Postural dysfunction   Rehab Potential Good   Clinical Impairments Affecting Rehab Potential positive: good PLOF, negative:  co-morbidities; Patient's clinical presentation is stable as his symptoms are isolated to LUE and do not vary much;    PT Frequency 2x / week   PT Duration 4 weeks   PT Treatment/Interventions ADLs/Self Care Home Management;Cryotherapy;Moist Heat;Traction;Therapeutic exercise;Therapeutic activities;Functional mobility training;Patient/family education;Manual techniques;Taping;Energy conservation;Dry needling;Passive range of motion   PT Next Visit Plan advance HEP, manual therapy consider traction;    PT Home Exercise Plan continue as previously given;    Consulted and Agree with Plan of Care Patient        Problem List Patient Active Problem List   Diagnosis Date Noted  . Nocturia 10/23/2015  . Absolute anemia 05/13/2014  . Benign  essential HTN 05/13/2014  . Blood glucose elevated 05/13/2014  . LBP (low back pain) 05/13/2014  . HLD (hyperlipidemia) 05/13/2014  . Malignant neoplasm of lateral wall of urinary bladder ([place]) 09/19/2013  . Benign prostatic hypertrophy without urinary obstruction 12/10/2012  . Hypospadias 12/10/2012  . Microscopic hematuria 12/10/2012  . History of neoplasm of bladder 12/10/2012  . FOM (frequency of micturition) 12/10/2012    Trotter,Margaret PT, DPT 03/16/2016, 4:38 PM  [place] MAIN [facility] SERVICES [address] [place], [place], [address] Phone: [phone]   Fax:  [phone]  Name: Trevor [name] MRN: [id number] Date of Birth: 24-Feb-1946

## 2016-03-21 ENCOUNTER — Ambulatory Visit: Payer: Medicare Other | Admitting: Physical Therapy

## 2016-03-21 ENCOUNTER — Encounter: Payer: Self-pay | Admitting: Physical Therapy

## 2016-03-21 DIAGNOSIS — M5410 Radiculopathy, site unspecified: Secondary | ICD-10-CM

## 2016-03-21 DIAGNOSIS — M542 Cervicalgia: Secondary | ICD-10-CM

## 2016-03-21 DIAGNOSIS — M436 Torticollis: Secondary | ICD-10-CM

## 2016-03-21 NOTE — Therapy (Signed)
[place] MAIN [facility] SERVICES [address] [place], [place], [address] Phone: [phone]   Fax:  [phone]  Physical Therapy Treatment  Patient Details  Name: Trevor [name] MRN: [id number] Date of Birth: [birth year] Referring Provider: Dr. Whitney Post  Encounter Date: 03/21/2016      PT End of Session - 03/21/16 1638    Visit Number 7   Number of Visits 13   Date for PT Re-Evaluation 03/28/16   Authorization Type Gcode 7   Authorization Time Period 10   PT Start Time 1600   PT Stop Time 1645   PT Time Calculation (min) 45 min   Activity Tolerance Patient tolerated treatment well;No increased pain   Behavior During Therapy [facility] for tasks assessed/performed      Past Medical History  Diagnosis Date  . Anemia   . Hyperlipidemia   . Low back pain   . Cervicalgia   . Allergic rhinitis   . GERD (gastroesophageal reflux disease)   . Hemorrhoids   . Acne   . Carpal tunnel syndrome   . Anxiety   . Mitral regurgitation   . Urinary frequency   . Sleep apnea   . Hypospadias   . BPH (benign prostatic hypertrophy) with urinary obstruction   . History of malignant neoplasm of bladder   . Hypertension     controlled with medication;   . Depression     controlled;   . Degenerative disc disease, cervical     neck and back;   . Cancer [facility])     bladder CA; remission since Oct 2014    Past Surgical History  Procedure Laterality Date  . Tonsillectomy    . Septoplasty    . Colonoscopy, esophagogastroduodenoscopy (egd) and esophageal dilation    . Flexible sigmoidoscopy    . Colonoscopy with propofol N/A 07/31/2015    Procedure: COLONOSCOPY WITH PROPOFOL;  Surgeon: Manya Silvas, MD;  Location: [facility] ENDOSCOPY;  Service: Endoscopy;  Laterality: N/A;  . Bladder surgery      2011, 2013, 2015    There were no vitals filed for this visit.    TREATMENT: PT had patient prone; PT performed grade II-III PA mobs along C7, T1, T2,  T3, T4 10 sec bouts x3 each; Patient reports increased tenderness with palpation along spinous process but not severe pain;  Less stiffness noted with joint mobs today as compared to previous sessions;   HOIST Mid Row BUE plate #2, [id number]; HOIST lat pull down BUE plate #2, [id number]; Patient required mod VCs to increase scapular retraction without increasing shoulder elevation; He also required min VCs to slow down eccentric return for better motor control;  PT educated patient in HEP: Standing with green tband in BUE: BUE shoulder extension x10; BUE shoulder rows x10; BUE horizontal abduction x10; BUE alternate diagonals x5 each direction; Patient required min VCs for correct positioning and technique for optimum strengthening;  Finished with [name] to left cervical/scapular paraspinals in sitting, 1 mHz, 1.8 watts per centimeter squared x10 min; Patient reports less discomfort after [name] with less tingling in LUE 4th digit. He continues to have tingling in 5th digit following [name] treatment;                             PT Education - 03/21/16 1638    Education provided Yes   Education Details postural strengthening/HEP   Person(s) Educated Patient   Methods Explanation;Verbal  cues   Comprehension Verbalized understanding;Returned demonstration;Verbal cues required             PT Long Term Goals - 02/29/16 1648    PT LONG TERM GOAL #1   Title Patient will be independent in home exercise program to improve strength/mobility for better functional independence with ADLs. by 03/28/16   Time 4   Period Weeks   Status New   PT LONG TERM GOAL #2   Title Patient will report a worst pain of 2/10 on VAS in LUE shoulder/arm  to improve tolerance with ADLs and reduced symptoms with activities. by 03/28/16   Time 4   Period Weeks   Status New   PT LONG TERM GOAL #3   Title Patient will report less numbness in LUE hand to <25% of time for improved tolerance with ADL tasks by  03/28/16   Time 4   Period Weeks   Status New   PT LONG TERM GOAL #4   Title Patient will report no pain with cervical AROM in all directions to improve tolerance with rotation when driving by [id number]   Time 4   Period Weeks   Status New               Plan - 03/21/16 1638    Clinical Impression Statement Instructed patient in postural strengthening exercise with increased resistance. Patient required mod VCs to increase scapular retraction and reduce shoulder elevation with advanced exercise. PT also performed US in sitting to left cervical/scapular paraspinals. Patient reports feeling "slightly" better with less tingling in 4th digit but continued tingling in 5th digit; He would benefit from additional skilled PT intervention to improve ROM, reduce stiffness and reduce tingling;    Rehab Potential Good   Clinical Impairments Affecting Rehab Potential positive: good PLOF, negative: co-morbidities; Patient's clinical presentation is stable as his symptoms are isolated to LUE and do not vary much;    PT Frequency 2x / week   PT Duration 2 weeks   PT Treatment/Interventions ADLs/Self Care Home Management;Cryotherapy;Moist Heat;Traction;Therapeutic exercise;Therapeutic activities;Functional mobility training;Patient/family education;Manual techniques;Taping;Energy conservation;Dry needling;Passive range of motion;Ultrasound;Electrical Stimulation   PT Next Visit Plan advance HEP, manual therapy consider traction;    PT Home Exercise Plan continue as previously given;    Consulted and Agree with Plan of Care Patient      Patient will benefit from skilled therapeutic intervention in order to improve the following deficits and impairments:  Hypomobility, Impaired sensation, Pain, Improper body mechanics, Decreased range of motion, Postural dysfunction  Visit Diagnosis: Neck pain - Plan: PT plan of care cert/re-cert  Radiculopathy affecting upper extremity - Plan: PT plan of care  cert/re-cert  Neck stiffness - Plan: PT plan of care cert/re-cert     Problem List Patient Active Problem List   Diagnosis Date Noted  . Nocturia 10/23/2015  . Absolute anemia 05/13/2014  . Benign essential HTN 05/13/2014  . Blood glucose elevated 05/13/2014  . LBP (low back pain) 05/13/2014  . HLD (hyperlipidemia) 05/13/2014  . Malignant neoplasm of lateral wall of urinary bladder ([place]) 09/19/2013  . Benign prostatic hypertrophy without urinary obstruction 12/10/2012  . Hypospadias 12/10/2012  . Microscopic hematuria 12/10/2012  . History of neoplasm of bladder 12/10/2012  . FOM (frequency of micturition) 12/10/2012    Trotter,Margaret PT, DPT 03/21/2016, 4:43 PM  [place] MAIN [facility] SERVICES [address] [place], [place], [address] Phone: [phone]   Fax:  [phone]  Name: Trevor [name] MRN:  [id number] Date of Birth: [birth year]

## 2016-03-21 NOTE — Patient Instructions (Addendum)
SHOULDER: Diagonal - Up and Away (Band)   Hold band in both hands, Keeping arms out in front of you with elbows straight, move one hand up and out and the other down and out into alternate diagonals. Alternate each direction, squeezing shoulder blades. Hold _2__ seconds. Use ________ band. _10 reps per set, _1_ sets per day, _1_ days per week  Copyright  VHI. All rights reserved.  Shoulder Retraction   Tie band around door knob (sitting or standing, holding band in both hands) Facing chest height anchor, grasp ends of band and pull hands to chest, squeezing shoulder blades together. Hold _3-5seconds. Repeat _10 times. Do _2_ sessions per day. Safety Note: Be sure anchor is secure.  Copyright  VHI. All rights reserved.  Strengthening: Resisted Extension   Hold band in both hands, Pull arm back, elbow straight, squeezing shoulder blades, Repeat _10___ times per set. Do _2___ sets per session. Do __1__ sessions per day.  http://orth.exer.us/833   Copyright  VHI. All rights reserved.   Copyright  VHI. All rights reserved.  Reverse Fly / Shoulder Retraction  Holding band in both hands, Extend both arms in front of body at shoulder height, palms down, holding band. Move arms out to sides, squeeze shoulder blades together. Repeat _10__ times. Do _2__ sessions per day.   Copyright  VHI. All rights reserved.

## 2016-03-23 ENCOUNTER — Ambulatory Visit: Payer: Medicare Other | Admitting: Physical Therapy

## 2016-03-23 ENCOUNTER — Encounter: Payer: Self-pay | Admitting: Physical Therapy

## 2016-03-23 DIAGNOSIS — M542 Cervicalgia: Secondary | ICD-10-CM | POA: Diagnosis not present

## 2016-03-23 DIAGNOSIS — M5410 Radiculopathy, site unspecified: Secondary | ICD-10-CM

## 2016-03-23 NOTE — Therapy (Signed)
[place] MAIN [facility] SERVICES [address] [place], [place], [address] Phone: [phone]   Fax:  [phone]  Physical Therapy Treatment/Discharge Summary  Patient Details  Name: Trevor [name] MRN: [id number] Date of Birth: 04/01/1946 Referring Provider: Dr. Whitney Post  Encounter Date: 03/23/2016      PT End of Session - 03/23/16 1614    Visit Number 8   Number of Visits 13   Date for PT Re-Evaluation 03/28/16   Authorization Type Gcode 1   Authorization Time Period 10   PT Start Time 1600   PT Stop Time 1645   PT Time Calculation (min) 45 min   Activity Tolerance Patient tolerated treatment well;No increased pain   Behavior During Therapy [facility] for tasks assessed/performed      Past Medical History  Diagnosis Date  . Anemia   . Hyperlipidemia   . Low back pain   . Cervicalgia   . Allergic rhinitis   . GERD (gastroesophageal reflux disease)   . Hemorrhoids   . Acne   . Carpal tunnel syndrome   . Anxiety   . Mitral regurgitation   . Urinary frequency   . Sleep apnea   . Hypospadias   . BPH (benign prostatic hypertrophy) with urinary obstruction   . History of malignant neoplasm of bladder   . Hypertension     controlled with medication;   . Depression     controlled;   . Degenerative disc disease, cervical     neck and back;   . Cancer [facility])     bladder CA; remission since Oct 2014    Past Surgical History  Procedure Laterality Date  . Tonsillectomy    . Septoplasty    . Colonoscopy, esophagogastroduodenoscopy (egd) and esophageal dilation    . Flexible sigmoidoscopy    . Colonoscopy with propofol N/A 07/31/2015    Procedure: COLONOSCOPY WITH PROPOFOL;  Surgeon: Manya Silvas, MD;  Location: [facility] ENDOSCOPY;  Service: Endoscopy;  Laterality: N/A;  . Bladder surgery      2011, 2013, 2015    There were no vitals filed for this visit.      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 1 hour   How long can you stand comfortably? 20 min; limited by back pain;    How long can you walk comfortably? >500 feet; walking helps with discomfort;    Diagnostic tests none reported;    Patient Stated Goals reduce numbness in fingers;    Currently in Pain? No/denies   Pain Onset More than a month ago            [facility] PT Assessment - 03/23/16 0001    Observation/Other Assessments   Neck Disability Index  8% (minimal disability, no significant change from initial eval on 02/29/16 which was 10%)   AROM   Cervical Flexion 60   Cervical Extension 45   Cervical - Right Side Bend 35   Cervical - Left Side Bend 25   Cervical - Right Rotation 80   Cervical - Left Rotation 85        TREATMENT: PT assessed cervical ROM  and neck disability index to assess progress towards goals; see above; Patient reports approximately 40% improvement in numbness in LUE hand but continues to have full tingling in 5th digit; Patient continues to have slumped posture; re-educated patient in HEP with instruction to focus on erect posture for less stiffness in spine; Educated about pros/cons of chiropractic treatment vs inversion table to assist with spinal mobility and radicular symptoms;  PT put patient on cervical mechanical traction, supine with head approximately Neutral position; 30# pull, 25# rest hold, 15 sec on, 10 sec off x15 min; Patient had less tingling in 4th digit but continues to have numbness in 5th digit following traction with no significant change;                       PT Education - 03/23/16 1641    Education provided Yes   Education  Details postural strengthening, cervical ROM, progress towards goals, recommendation;    Person(s) Educated Patient   Methods Explanation;Verbal cues   Comprehension Verbalized understanding;Returned demonstration;Verbal cues required             PT Long Term Goals - 03/23/16 1615    PT LONG TERM GOAL #1   Title Patient will be independent in home exercise program to improve strength/mobility for better functional independence with ADLs. by 03/28/16   Time 4   Period Weeks   Status Achieved   PT LONG TERM GOAL #2   Title Patient will report a worst pain of 2/10 on VAS in LUE shoulder/arm  to improve tolerance with ADLs and reduced symptoms with activities. by 03/28/16   Time 4   Period Weeks   Status Achieved   PT LONG TERM GOAL #3   Title Patient will report less numbness in LUE hand to <25% of time for improved tolerance with ADL tasks by 03/28/16   Time 4   Period Weeks   Status Not Met   PT LONG TERM GOAL #4   Title Patient will report no pain with cervical AROM in all directions to improve tolerance with rotation when driving by 5/62/13   Time 4   Period Weeks   Status Achieved               Plan - 03/23/16 1642    Clinical Impression Statement Patient exhibits improved cervical ROM with less stiffness. He continues to demonstrate a moderate slumped posture particularly with sitting. Patient able to self correct with verbal cues but will revert back to slumped posture; He continues to have numbness in C7 nerve root despite manual therapy, modalities, cervical traction etc. He has not made significant changes with conservative treament. PT recommends patient follow up with MD regarding other options for nerve impingement and numbness in hands. He is not appropriate for additional conservative treatment as he has had minimal changes;    Rehab Potential Good   Clinical Impairments Affecting Rehab Potential positive: good PLOF, negative: co-morbidities; Patient's clinical  presentation is stable as his symptoms are isolated to LUE and do not vary much;    PT Frequency 2x / week   PT Duration 2 weeks   PT Treatment/Interventions ADLs/Self Care Home Management;Cryotherapy;Moist Heat;Traction;Therapeutic exercise;Therapeutic activities;Functional mobility training;Patient/family education;Manual techniques;Taping;Energy conservation;Dry needling;Passive range of motion;Ultrasound;Electrical Stimulation   PT Next Visit Plan recommend DC from therapy and return to MD for other options.    PT Home Exercise Plan continue as previously given;    Consulted and Agree with Plan of Care Patient  Patient will benefit from skilled therapeutic intervention in order to improve the following deficits and impairments:  Hypomobility, Impaired sensation, Pain, Improper body mechanics, Decreased range of motion, Postural dysfunction  Visit Diagnosis: Cervicalgia  Radiculopathy affecting upper extremity       G-Codes - 04-10-2016 1644    Functional Assessment Tool Used Neck disability index, strength, clinical judgement;    Functional Limitation Carrying, moving and handling objects   Carrying, Moving and Handling Objects Goal Status [phone]) 0 percent impaired, limited or restricted   Carrying, Moving and Handling Objects Discharge Status [phone]) At least 1 percent but less than 20 percent impaired, limited or restricted      Problem List Patient Active Problem List   Diagnosis Date Noted  . Nocturia 10/23/2015  . Absolute anemia 05/13/2014  . Benign essential HTN 05/13/2014  . Blood glucose elevated 05/13/2014  . LBP (low back pain) 05/13/2014  . HLD (hyperlipidemia) 05/13/2014  . Malignant neoplasm of lateral wall of urinary bladder ([place]) 09/19/2013  . Benign prostatic hypertrophy without urinary obstruction 12/10/2012  . Hypospadias 12/10/2012  . Microscopic hematuria 12/10/2012  . History of neoplasm of bladder 12/10/2012  . FOM (frequency of micturition)  12/10/2012    Jaidyn Kuhl PT, DPT Apr 10, 2016, 4:48 PM  [place] MAIN [facility] SERVICES [address] [place], [place], [address] Phone: [phone]   Fax:  [phone]  Name: Trevor [name] MRN: [id number] Date of Birth: 1946-10-05

## 2016-03-29 ENCOUNTER — Encounter: Payer: Medicare Other | Admitting: Physical Therapy

## 2016-03-31 ENCOUNTER — Ambulatory Visit: Payer: Medicare Other | Admitting: Physical Therapy

## 2016-04-05 ENCOUNTER — Encounter: Payer: Medicare Other | Admitting: Physical Therapy

## 2016-04-07 ENCOUNTER — Encounter: Payer: Medicare Other | Admitting: Physical Therapy

## 2016-07-15 ENCOUNTER — Other Ambulatory Visit: Payer: Self-pay | Admitting: Physical Medicine and Rehabilitation

## 2016-07-15 DIAGNOSIS — M5416 Radiculopathy, lumbar region: Secondary | ICD-10-CM

## 2016-08-04 ENCOUNTER — Ambulatory Visit
Admission: RE | Admit: 2016-08-04 | Discharge: 2016-08-04 | Disposition: A | Discharge status: Home / Self Care | Payer: Medicare Other | Source: Ambulatory Visit | Attending: Physical Medicine and Rehabilitation | Admitting: Physical Medicine and Rehabilitation

## 2016-08-04 DIAGNOSIS — M5126 Other intervertebral disc displacement, lumbar region: Secondary | ICD-10-CM | POA: Insufficient documentation

## 2016-08-04 DIAGNOSIS — M1288 Other specific arthropathies, not elsewhere classified, other specified site: Secondary | ICD-10-CM | POA: Diagnosis not present

## 2016-08-04 DIAGNOSIS — M5416 Radiculopathy, lumbar region: Secondary | ICD-10-CM

## 2016-08-04 DIAGNOSIS — M4806 Spinal stenosis, lumbar region: Secondary | ICD-10-CM | POA: Diagnosis not present

## 2016-10-21 ENCOUNTER — Encounter: Payer: Self-pay | Admitting: Urology

## 2016-10-21 ENCOUNTER — Ambulatory Visit (INDEPENDENT_AMBULATORY_CARE_PROVIDER_SITE_OTHER): Payer: Medicare Other | Admitting: Urology

## 2016-10-21 ENCOUNTER — Telehealth: Payer: Self-pay | Admitting: Urology

## 2016-10-21 VITALS — BP 149/78 | HR 68 | Ht 68.0 in | Wt 190.9 lb

## 2016-10-21 DIAGNOSIS — C672 Malignant neoplasm of lateral wall of bladder: Secondary | ICD-10-CM

## 2016-10-21 DIAGNOSIS — Q541 Hypospadias, penile: Secondary | ICD-10-CM

## 2016-10-21 DIAGNOSIS — Z8551 Personal history of malignant neoplasm of bladder: Secondary | ICD-10-CM

## 2016-10-21 LAB — URINALYSIS, COMPLETE
BILIRUBIN UA: NEGATIVE
GLUCOSE, UA: NEGATIVE
KETONES UA: NEGATIVE
NITRITE UA: NEGATIVE
PROTEIN UA: NEGATIVE
SPEC GRAV UA: 1.02 (ref 1.005–1.030)
UUROB: 0.2 mg/dL (ref 0.2–1.0)
pH, UA: 5.5 (ref 5.0–7.5)

## 2016-10-21 LAB — MICROSCOPIC EXAMINATION: Bacteria, UA: NONE SEEN

## 2016-10-21 MED ORDER — LIDOCAINE HCL 2 % EX GEL
1.0000 "application " | Freq: Once | CUTANEOUS | Status: AC
  Administered 2016-10-21: 1 via URETHRAL

## 2016-10-21 MED ORDER — CIPROFLOXACIN HCL 500 MG PO TABS
500.0000 mg | ORAL_TABLET | Freq: Once | ORAL | Status: AC
  Administered 2016-10-21: 500 mg via ORAL

## 2016-10-21 NOTE — Telephone Encounter (Signed)
Patient called back to let you know that he was taking trazodone 100mg  tablets . He said he forgot to tell you this at his appt today.  he wants this added to his charts.   Sharyn Lull

## 2016-10-21 NOTE — Progress Notes (Signed)
10/21/2016 10:30 AM   [name] August 12, 1946 [id number]  Referring provider: Derinda Late, MD [phone] [address] [facility] [place], [place] [address]  Chief Complaint  Patient presents with  . Cysto    HPI: 70 year old male with a history of bladder cancer first diagnosed in 2011, no great TA disease identified and gross hematuria workup. 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  His most recent surveillance cystoscopy was 11//2016 at which time we were surveying a small area of hypervascularity near the area of resection scar measuring less than 0.5 cm adjacent to the right UO which remained unchanged from previous cystos. Urine cytology was negative.  No recent episodes of gross hematuria.  No risk factors for bladder cancer including no smoking history.  Most recent upper tract imaging at the time of diagnosis in 2011.   PSA/rectal exams performed by a PCP annually.  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  He returns today for annual surveillance cystoscopy.   PMH: Past Medical History:  Diagnosis Date  . Acne   . Allergic rhinitis   . Anemia   . Anxiety   . BPH (benign prostatic hypertrophy) with urinary obstruction   . Cancer [facility])    bladder CA; remission since Oct 2014  . Carpal tunnel syndrome   . Cervicalgia   . Degenerative disc disease, cervical    neck and back;   . Depression    controlled;   Marland Kitchen GERD (gastroesophageal reflux disease)   . Hemorrhoids   . History of malignant neoplasm of bladder   . Hyperlipidemia   . Hypertension    controlled with medication;   . Hypospadias   . Low back pain   . Mitral regurgitation   . Sleep apnea   . Urinary frequency     Surgical History: Past Surgical History:  Procedure  Laterality Date  . BLADDER SURGERY     2011, 2013, 2015  . COLONOSCOPY WITH PROPOFOL N/A 07/31/2015   Procedure: COLONOSCOPY WITH PROPOFOL;  Surgeon: Manya Silvas, MD;  Location: [facility] ENDOSCOPY;  Service: Endoscopy;  Laterality: N/A;  . COLONOSCOPY, ESOPHAGOGASTRODUODENOSCOPY (EGD) AND ESOPHAGEAL DILATION    . FLEXIBLE SIGMOIDOSCOPY    . SEPTOPLASTY    . TONSILLECTOMY      Home Medications:    Medication List       Accurate as of 10/21/16 10:30 AM. Always use your most recent med list.          aspirin 81 MG tablet Take 81 mg by mouth daily.   b complex vitamins capsule Take 1 capsule by mouth daily.   cetirizine 10 MG tablet Commonly known as:  ZYRTEC Take 10 mg by mouth daily.   ibuprofen 600 MG tablet Commonly known as:  ADVIL,MOTRIN Take 600 mg by mouth every 6 (six) hours as needed.   lisinopril-hydrochlorothiazide 10-12.5 MG tablet Commonly known as:  PRINZIDE,ZESTORETIC   multivitamin with minerals Tabs tablet Take 1 tablet by mouth daily.   omeprazole 20 MG tablet Commonly known as:  PRILOSEC OTC Take 20 mg by mouth daily.   simvastatin 40 MG tablet Commonly known as:  ZOCOR Take 40 mg by mouth daily.   sucralfate 1 g tablet Commonly known as:  CARAFATE Take 1.5 g by mouth 4 (four) times daily -  with meals and at bedtime.   tamsulosin 0.4 MG Caps capsule Commonly known as:  FLOMAX TAKE 1 CAPSULE DAILY   URINOZINC PLUS Tabs Take by mouth.       Allergies: No Known Allergies  Family History: Family History  Problem Relation Age of Onset  . Hyperlipidemia Brother   . Hyperlipidemia Sister     Social History:  reports that he has never smoked. He has never used smokeless tobacco. He reports that he does not drink alcohol or use drugs.   Physical Exam: BP (!) 149/78   Pulse 68   Ht 5\' 8"  (1.727 m)   Wt 190 lb 14.4 oz (86.6 kg)   BMI 29.03 kg/m   Constitutional:  Alert and oriented, No acute distress. HEENT: [place] AT, moist mucus  membranes.  Trachea midline, no masses. Cardiovascular: No clubbing, cyanosis, or edema. Respiratory: Normal respiratory effort, no increased work of breathing. GI: Abdomen is soft, nontender, nondistended, no abdominal masses GU: No CVA tenderness. Normal bilateral descended testicles. Phallus with dorsal hood, subcoronal hypospadias. Skin: No rashes, bruises or suspicious lesions. Neurologic: Grossly intact, no focal deficits, moving all 4 extremities. Psychiatric: Normal mood and affect.  Laboratory Data: Lab Results  Component Value Date   WBC 6.9 03/01/2013   HGB 14.0 03/01/2013   HCT 41.1 03/01/2013   MCV 89 03/01/2013   PLT 127 (L) 03/01/2013    Lab Results  Component Value Date   CREATININE 0.86 03/01/2013     Lab Results  Component Value Date   HGBA1C 5.4 03/01/2013    Urinalysis UA reviewed, see EPIC  Cystoscopy Procedure Note  Patient identification was confirmed, informed consent was obtained, and patient was prepped using Betadine solution.  Lidocaine jelly was administered per urethral meatus.    Preoperative abx where received prior to procedure.     Pre-Procedure: - Inspection reveals a normal caliber ureteral meatus.  Procedure: The flexible cystoscope was introduced without difficulty through ventral hypospadiac urethral meatus - No urethral strictures/lesions are present.   - Normal prostate  - Normal bladder neck - Bilateral ureteral orifices identified - Bladder mucosa  reveals no ulcers, tumors, or lesions, 0.5 cm patch of erythema adjacent to right UO stable and unchanged - No bladder stones - No trabeculation  Retroflexion shows unremarkable prostate/ bladder neck.   Post-Procedure: - Patient tolerated the procedure well   Assessment & Plan:    1. Personal history of malignant neoplasm of bladder Cystoscopy today unremarkable. Recommend per AUA guidelines for cystoscopy annually up to 5 years from 2014, his last known  tumor. Cytology  repeated Stable 0.5 cm patch of erythema on right lateral wall- unchanged for years - Urinalysis, Complete - ciprofloxacin (CIPRO) tablet 500 mg; Take 1 tablet (500 mg total) by mouth once. - lidocaine (XYLOCAINE) 2 % jelly 1 application; Place 1 application into the urethra once.  2. Penile hypospadias Unbothered, no intervention.  3. Benign prostatic hypertrophy without urinary obstruction Continue Flomax.    Return for cystoscopy.  Hollice Espy, MD  [facility] [address] [place], [place] [address] [phone]

## 2016-10-27 ENCOUNTER — Other Ambulatory Visit: Payer: Self-pay | Admitting: Urology

## 2016-10-31 ENCOUNTER — Other Ambulatory Visit: Payer: Self-pay | Admitting: Urology

## 2017-01-16 ENCOUNTER — Other Ambulatory Visit: Payer: Self-pay | Admitting: Urology

## 2017-01-16 DIAGNOSIS — N400 Enlarged prostate without lower urinary tract symptoms: Secondary | ICD-10-CM

## 2017-07-03 ENCOUNTER — Other Ambulatory Visit: Payer: Self-pay | Admitting: Physical Medicine and Rehabilitation

## 2017-07-03 DIAGNOSIS — M5416 Radiculopathy, lumbar region: Secondary | ICD-10-CM

## 2017-07-07 ENCOUNTER — Ambulatory Visit
Admission: RE | Admit: 2017-07-07 | Discharge: 2017-07-07 | Disposition: A | Discharge status: Home / Self Care | Payer: Medicare Other | Source: Ambulatory Visit | Attending: Physical Medicine and Rehabilitation | Admitting: Physical Medicine and Rehabilitation

## 2017-07-07 DIAGNOSIS — M5416 Radiculopathy, lumbar region: Secondary | ICD-10-CM

## 2017-07-07 DIAGNOSIS — M48061 Spinal stenosis, lumbar region without neurogenic claudication: Secondary | ICD-10-CM | POA: Insufficient documentation

## 2017-10-25 ENCOUNTER — Encounter: Payer: Self-pay | Admitting: Urology

## 2017-10-25 ENCOUNTER — Ambulatory Visit (INDEPENDENT_AMBULATORY_CARE_PROVIDER_SITE_OTHER): Payer: Medicare Other | Admitting: Urology

## 2017-10-25 VITALS — BP 137/70 | HR 67 | Ht 67.0 in | Wt 185.0 lb

## 2017-10-25 DIAGNOSIS — C679 Malignant neoplasm of bladder, unspecified: Secondary | ICD-10-CM

## 2017-10-25 DIAGNOSIS — Z8551 Personal history of malignant neoplasm of bladder: Secondary | ICD-10-CM

## 2017-10-25 DIAGNOSIS — Q541 Hypospadias, penile: Secondary | ICD-10-CM

## 2017-10-25 LAB — MICROSCOPIC EXAMINATION: Epithelial Cells (non renal): NONE SEEN /hpf (ref 0–10)

## 2017-10-25 LAB — URINALYSIS, COMPLETE
Bilirubin, UA: NEGATIVE
GLUCOSE, UA: NEGATIVE
KETONES UA: NEGATIVE
NITRITE UA: NEGATIVE
PROTEIN UA: NEGATIVE
SPEC GRAV UA: 1.025 (ref 1.005–1.030)
UUROB: 0.2 mg/dL (ref 0.2–1.0)
pH, UA: 5.5 (ref 5.0–7.5)

## 2017-10-25 MED ORDER — CIPROFLOXACIN HCL 500 MG PO TABS
500.0000 mg | ORAL_TABLET | Freq: Once | ORAL | Status: AC
  Administered 2017-10-25: 500 mg via ORAL

## 2017-10-25 MED ORDER — LIDOCAINE HCL 2 % EX GEL
1.0000 "application " | Freq: Once | CUTANEOUS | Status: AC
  Administered 2017-10-25: 1 via URETHRAL

## 2017-10-25 NOTE — Progress Notes (Signed)
10/25/2017 9:23 AM   Trevor [name] Nov 29, 1946 [id number]  Referring provider: Derinda Late, MD [phone] [address] [place] [facility] [place], [place] [address]  Chief Complaint  Patient presents with  . Cysto    HPI: 71 year old male with a history of bladder cancer first diagnosed in 2011, LgTA disease identified and gross hematuria workup. 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  On several cystoscopies , a small area of hypervascularity near the area of resection scar measuring less than 0.5 cm adjacent to the right UO has been noted which has remained unchanged. Urine cytology was negative.  No recent episodes of gross hematuria.  No risk factors for bladder cancer including no smoking history.  Most recent upper tract imaging at the time of diagnosis in 2011.   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  He returns today for annual surveillance cystoscopy.  He has been well this year but will be undergoing back surgery for disc issue in the near future at [facility].   PMH: Past Medical History:  Diagnosis Date  . Acne   . Allergic rhinitis   . Anemia   . Anxiety   . BPH (benign prostatic hypertrophy) with urinary obstruction   . Cancer [facility])    bladder CA; remission since Oct 2014  . Carpal tunnel syndrome   . Cervicalgia   . Degenerative disc disease, cervical    neck and back;   . Depression    controlled;   Marland Kitchen GERD (gastroesophageal reflux disease)   . Hemorrhoids   . History of malignant neoplasm of bladder   . Hyperlipidemia   . Hypertension    controlled with medication;   . Hypospadias   . Low back pain   . Mitral regurgitation   . Sleep apnea   . Urinary frequency     Surgical History: Past Surgical History:  Procedure Laterality Date  .  BLADDER SURGERY     2011, 2013, 2015  . COLONOSCOPY, ESOPHAGOGASTRODUODENOSCOPY (EGD) AND ESOPHAGEAL DILATION    . FLEXIBLE SIGMOIDOSCOPY    . SEPTOPLASTY    . TONSILLECTOMY      Home Medications:  Allergies as of 10/25/2017   No Known Allergies     Medication List        Accurate as of 10/25/17  9:23 AM. Always use your most recent med list.          aspirin 81 MG tablet Take 81 mg by mouth daily.   b complex vitamins capsule Take 1 capsule by mouth daily.   cetirizine 10 MG tablet Commonly known as:  ZYRTEC Take 10 mg by mouth daily.   ibuprofen 600 MG tablet Commonly known as:  ADVIL,MOTRIN Take 600 mg by mouth every 6 (six) hours as needed.   lisinopril-hydrochlorothiazide 10-12.5 MG tablet Commonly known as:  PRINZIDE,ZESTORETIC   multivitamin with minerals Tabs tablet Take 1 tablet by mouth daily.   omeprazole 20 MG tablet Commonly known as:  PRILOSEC OTC Take 20 mg by mouth daily.   simvastatin 40 MG tablet Commonly known as:  ZOCOR Take 40 mg by mouth daily.   sucralfate 1 g tablet Commonly known as:  CARAFATE Take 1.5 g by mouth 4 (four) times daily -  with meals and at bedtime.   tamsulosin 0.4 MG  Caps capsule Commonly known as:  FLOMAX TAKE 1 CAPSULE DAILY   URINOZINC PLUS Tabs Take by mouth.       Allergies: No Known Allergies  Family History: Family History  Problem Relation Age of Onset  . Hyperlipidemia Brother   . Hyperlipidemia Sister     Social History:  reports that  has never smoked. he has never used smokeless tobacco. He reports that he does not drink alcohol or use drugs.   Physical Exam: BP 137/70   Pulse 67   Ht 5\' 7"  (1.702 m)   Wt 185 lb (83.9 kg)   BMI 28.98 kg/m   Constitutional:  Alert and oriented, No acute distress. HEENT: [place] AT, moist mucus membranes.  Trachea midline, no masses. Cardiovascular: No clubbing, cyanosis, or edema. Respiratory: Normal respiratory effort, no increased work of  breathing. GI: Abdomen is soft, nontender, nondistended, no abdominal masses GU: No CVA tenderness. Normal bilateral descended testicles. Phallus with dorsal hood, subcoronal hypospadias. Skin: No rashes, bruises or suspicious lesions. Neurologic: Grossly intact, no focal deficits, moving all 4 extremities. Psychiatric: Normal mood and affect.  Laboratory Data: Lab Results  Component Value Date   WBC 6.9 03/01/2013   HGB 14.0 03/01/2013   HCT 41.1 03/01/2013   MCV 89 03/01/2013   PLT 127 (L) 03/01/2013    Lab Results  Component Value Date   CREATININE 0.86 03/01/2013     Lab Results  Component Value Date   HGBA1C 5.4 03/01/2013    Urinalysis UA reviewed, see EPIC  Cystoscopy Procedure Note  Patient identification was confirmed, informed consent was obtained, and patient was prepped using Betadine solution.  Lidocaine jelly was administered per urethral meatus.    Preoperative abx where received prior to procedure.     Pre-Procedure: - Inspection reveals a normal caliber ureteral meatus.  Procedure: The flexible cystoscope was introduced without difficulty through ventral hypospadiac urethral meatus - No urethral strictures/lesions are present.   - Normal prostate  - Normal bladder neck - Bilateral ureteral orifices identified - Bladder mucosa  reveals no ulcers, tumors, or lesions, 0.5 cm patch of erythema adjacent to right UO stable and unchanged - No bladder stones - No trabeculation  Retroflexion shows unremarkable prostate/ bladder neck.  Post-Procedure: - Patient tolerated the procedure well  Assessment & Plan:    1. Personal history of malignant neoplasm of bladder Cystoscopy today unremarkable. Recommend per AUA guidelines for cystoscopy annually up to 5 years from 2014, his last known tumor. Stable 0.5 cm patch of erythema on right lateral wall- unchanged for years Defer cytology today as this has been negative on several previous occasions -  Urinalysis, Complete - ciprofloxacin (CIPRO) tablet 500 mg; Take 1 tablet (500 mg total) by mouth once. - lidocaine (XYLOCAINE) 2 % jelly 1 application; Place 1 application into the urethra once.  2. Penile hypospadias Unbothered, no intervention.  3. Benign prostatic hypertrophy without urinary obstruction Continue Flomax.    Return in about 1 year (around 10/25/2018) for cysto.  Hollice Espy, MD  [facility] [address] [place], [place] [address] [phone]

## 2017-11-20 HISTORY — PX: ANTERIOR FUSION LUMBAR SPINE: SUR629

## 2017-11-22 ENCOUNTER — Encounter
Admission: RE | Admit: 2017-11-22 | Discharge: 2017-11-22 | Disposition: A | Discharge status: Home / Self Care | Payer: Medicare Other | Source: Ambulatory Visit | Attending: Internal Medicine | Admitting: Internal Medicine

## 2017-11-27 ENCOUNTER — Other Ambulatory Visit: Payer: Self-pay

## 2017-11-27 MED ORDER — HYDROCODONE-ACETAMINOPHEN 5-325 MG PO TABS: 1.0000 | ORAL_TABLET | ORAL | 0 refills | Status: DC | PRN

## 2017-11-27 MED ORDER — OXYCODONE HCL 5 MG PO TABS: 5.0000 mg | ORAL_TABLET | Freq: Four times a day (QID) | ORAL | 0 refills | Status: DC | PRN

## 2017-11-27 NOTE — Telephone Encounter (Signed)
Rx sent to [facility] phone : [phone] , fax : [phone]  

## 2017-11-29 ENCOUNTER — Other Ambulatory Visit: Payer: Self-pay

## 2017-11-29 MED ORDER — HYDROCODONE-ACETAMINOPHEN 5-325 MG PO TABS: 1.0000 | ORAL_TABLET | ORAL | 0 refills | Status: DC | PRN

## 2017-11-29 NOTE — Telephone Encounter (Signed)
Rx sent to [facility] phone : [phone] , fax : [phone]  

## 2017-12-07 ENCOUNTER — Other Ambulatory Visit
Admission: RE | Admit: 2017-12-07 | Discharge: 2017-12-07 | Disposition: A | Discharge status: Home / Self Care | Payer: Medicare Other | Source: Other Acute Inpatient Hospital | Attending: Gerontology | Admitting: Gerontology

## 2017-12-07 DIAGNOSIS — R42 Dizziness and giddiness: Secondary | ICD-10-CM | POA: Insufficient documentation

## 2017-12-07 DIAGNOSIS — I959 Hypotension, unspecified: Secondary | ICD-10-CM | POA: Diagnosis present

## 2017-12-07 LAB — CBC WITH DIFFERENTIAL/PLATELET
BASOS PCT: 1 %
Basophils Absolute: 0 10*3/uL (ref 0–0.1)
EOS ABS: 0.2 10*3/uL (ref 0–0.7)
Eosinophils Relative: 3 %
HCT: 33.5 % — ABNORMAL LOW (ref 40.0–52.0)
Hemoglobin: 11.2 g/dL — ABNORMAL LOW (ref 13.0–18.0)
Lymphocytes Relative: 13 %
Lymphs Abs: 0.9 10*3/uL — ABNORMAL LOW (ref 1.0–3.6)
MCH: 29.6 pg (ref 26.0–34.0)
MCHC: 33.6 g/dL (ref 32.0–36.0)
MCV: 88.1 fL (ref 80.0–100.0)
Monocytes Absolute: 0.6 10*3/uL (ref 0.2–1.0)
Monocytes Relative: 8 %
NEUTROS PCT: 75 %
Neutro Abs: 5.1 10*3/uL (ref 1.4–6.5)
Platelets: 227 10*3/uL (ref 150–440)
RBC: 3.8 MIL/uL — AB (ref 4.40–5.90)
RDW: 13 % (ref 11.5–14.5)
WBC: 6.9 10*3/uL (ref 3.8–10.6)

## 2017-12-07 LAB — COMPREHENSIVE METABOLIC PANEL
ALBUMIN: 3.8 g/dL (ref 3.5–5.0)
ALT: 23 U/L (ref 17–63)
ANION GAP: 8 (ref 5–15)
AST: 29 U/L (ref 15–41)
Alkaline Phosphatase: 156 U/L — ABNORMAL HIGH (ref 38–126)
BUN: 21 mg/dL — ABNORMAL HIGH (ref 6–20)
CALCIUM: 9.1 mg/dL (ref 8.9–10.3)
CHLORIDE: 96 mmol/L — AB (ref 101–111)
CO2: 28 mmol/L (ref 22–32)
CREATININE: 0.89 mg/dL (ref 0.61–1.24)
GFR calc Af Amer: >60 mL/min (ref >60)
GFR calc non Af Amer: >60 mL/min (ref >60)
GLUCOSE: 137 mg/dL — AB (ref 65–99)
Potassium: 3.7 mmol/L (ref 3.5–5.1)
SODIUM: 132 mmol/L — AB (ref 135–145)
Total Bilirubin: 0.8 mg/dL (ref 0.3–1.2)
Total Protein: 7.4 g/dL (ref 6.5–8.1)

## 2017-12-09 ENCOUNTER — Other Ambulatory Visit: Payer: Self-pay | Admitting: Urology

## 2017-12-09 DIAGNOSIS — N400 Enlarged prostate without lower urinary tract symptoms: Secondary | ICD-10-CM

## 2017-12-12 ENCOUNTER — Encounter
Admission: RE | Admit: 2017-12-12 | Discharge: 2017-12-12 | Disposition: A | Discharge status: Home / Self Care | Payer: Medicare Other | Source: Ambulatory Visit | Attending: Internal Medicine | Admitting: Internal Medicine

## 2017-12-26 ENCOUNTER — Encounter: Payer: Self-pay | Admitting: Urology

## 2017-12-26 ENCOUNTER — Ambulatory Visit (INDEPENDENT_AMBULATORY_CARE_PROVIDER_SITE_OTHER): Payer: Medicare Other | Admitting: Urology

## 2017-12-26 VITALS — BP 118/66 | HR 91 | Ht 67.0 in | Wt 180.0 lb

## 2017-12-26 DIAGNOSIS — Q541 Hypospadias, penile: Secondary | ICD-10-CM

## 2017-12-26 DIAGNOSIS — N400 Enlarged prostate without lower urinary tract symptoms: Secondary | ICD-10-CM

## 2017-12-26 DIAGNOSIS — R972 Elevated prostate specific antigen [PSA]: Secondary | ICD-10-CM | POA: Diagnosis not present

## 2017-12-26 DIAGNOSIS — Z8551 Personal history of malignant neoplasm of bladder: Secondary | ICD-10-CM | POA: Diagnosis not present

## 2017-12-26 NOTE — Progress Notes (Signed)
12/26/2017 9:03 AM   Trevor [name] May 03, 1946 [id number]  Referring provider: Derinda Late, MD [phone] [address] [facility] [place],  [address]  Chief Complaint  Patient presents with  . Elevated PSA    HPI: 72 year old male with a personal history of bladder elevated who returns today referred back for evaluation of elevated PSA.  He was referred by his primary care primarily for further evaluation of elevated PSA.  His most recent PSA on 06/06/2017 was 4.19.  Prior to this, his PSA was 2.79 and 05/2016, 3.7 in 04/2015, and 2.54 on 04/2014.  No family history of prostate cancer.  No significant voiding issues at baseline.  He is on Flomax chronically.  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  Most recent annual cystoscopy on 10/2017- for recurrence.  Please see previous notes for bladder cancer history.  PMH: Past Medical History:  Diagnosis Date  . Acne   . Allergic rhinitis   . Anemia   . Anxiety   . BPH (benign prostatic hypertrophy) with urinary obstruction   . Cancer [facility])    bladder CA; remission since Oct 2014  . Carpal tunnel syndrome   . Cervicalgia   . Degenerative disc disease, cervical    neck and back;   . Depression    controlled;   Marland Kitchen GERD (gastroesophageal reflux disease)   . Hemorrhoids   . History of malignant neoplasm of bladder   . Hyperlipidemia   . Hypertension    controlled with medication;   . Hypospadias   . Low back pain   . Mitral regurgitation   . Sleep apnea   . Urinary frequency     Surgical History: Past Surgical History:  Procedure Laterality Date  . BLADDER SURGERY     2011, 2013, 2015  . COLONOSCOPY WITH PROPOFOL N/A 07/31/2015   Procedure: COLONOSCOPY WITH PROPOFOL;  Surgeon: Manya Silvas, MD;  Location: [facility] ENDOSCOPY;  Service: Endoscopy;  Laterality: N/A;  . COLONOSCOPY, ESOPHAGOGASTRODUODENOSCOPY (EGD) AND ESOPHAGEAL DILATION    . FLEXIBLE SIGMOIDOSCOPY     . SEPTOPLASTY    . TONSILLECTOMY      Home Medications:  Allergies as of 12/26/2017   No Known Allergies     Medication List        Accurate as of 12/26/17 11:59 PM. Always use your most recent med list.          aspirin 81 MG tablet Take 81 mg by mouth daily.   b complex vitamins capsule Take 1 capsule by mouth daily.   cetirizine 10 MG tablet Commonly known as:  ZYRTEC Take 10 mg by mouth daily.   HYDROcodone-acetaminophen 5-325 MG tablet Commonly known as:  NORCO/VICODIN Take 1-2 tablets by mouth every 4 (four) hours as needed for moderate pain or severe pain. 1 tab for moderate pain and 2 tabs for severe pain   ibuprofen 600 MG tablet Commonly known as:  ADVIL,MOTRIN Take 600 mg by mouth every 6 (six) hours as needed.   lisinopril-hydrochlorothiazide 10-12.5 MG tablet Commonly known as:  PRINZIDE,ZESTORETIC   methocarbamol 500 MG tablet Commonly known as:  ROBAXIN   multivitamin with minerals Tabs tablet Take 1 tablet by mouth daily.   omeprazole 20 MG tablet Commonly known as:  PRILOSEC OTC Take 20 mg by mouth daily.   oxyCODONE 5 MG immediate release tablet Commonly known as:  Oxy IR/ROXICODONE Take 1 tablet (5 mg total) by mouth every  6 (six) hours as needed. For pain score >/= 3/10   senna-docusate 8.6-50 MG tablet Commonly known as:  Senokot-S Take by mouth.   simvastatin 40 MG tablet Commonly known as:  ZOCOR Take 40 mg by mouth daily.   sucralfate 1 g tablet Commonly known as:  CARAFATE Take 1.5 g by mouth 4 (four) times daily -  with meals and at bedtime.   tamsulosin 0.4 MG Caps capsule Commonly known as:  FLOMAX TAKE 1 CAPSULE DAILY   traZODone 100 MG tablet Commonly known as:  DESYREL   URINOZINC PLUS Tabs Take by mouth.       Allergies: No Known Allergies  Family History: Family History  Problem Relation Age of Onset  . Hyperlipidemia Brother   . Hyperlipidemia Sister     Social History:  reports that  has never  smoked. he has never used smokeless tobacco. He reports that he does not drink alcohol or use drugs.  ROS: UROLOGY Frequent Urination?: No Hard to postpone urination?: No Burning/pain with urination?: No Get up at night to urinate?: No Leakage of urine?: No Urine stream starts and stops?: No Trouble starting stream?: No Do you have to strain to urinate?: No Blood in urine?: No Urinary tract infection?: No Sexually transmitted disease?: No Injury to kidneys or bladder?: No Painful intercourse?: No Weak stream?: No Erection problems?: No Penile pain?: No  Gastrointestinal Nausea?: No Vomiting?: No Indigestion/heartburn?: No Diarrhea?: No Constipation?: No  Constitutional Fever: No Night sweats?: No Weight loss?: No Fatigue?: No  Skin Skin rash/lesions?: No Itching?: No  Eyes Blurred vision?: No Double vision?: No  Ears/Nose/Throat Sore throat?: No Sinus problems?: No  Hematologic/Lymphatic Swollen glands?: No Easy bruising?: No  Cardiovascular Leg swelling?: No Chest pain?: No  Respiratory Cough?: No Shortness of breath?: No  Endocrine Excessive thirst?: No  Musculoskeletal Back pain?: No Joint pain?: No  Neurological Headaches?: No Dizziness?: No  Psychologic Depression?: No Anxiety?: No  Physical Exam: BP 118/66   Pulse 91   Ht 5\' 7"  (1.702 m)   Wt 180 lb (81.6 kg)   BMI 28.19 kg/m   Constitutional:  Alert and oriented, No acute distress. HEENT: [place] AT, moist mucus membranes.  Trachea midline, no masses. Cardiovascular: No clubbing, cyanosis, or edema. Respiratory: Normal respiratory effort, no increased work of breathing. GI: Abdomen is soft, nontender, nondistended, no abdominal masses Rectal: External hemorrhoids appreciated.  Normal sphincter tone.  Small 30 cc prostate, nontender, rubbery, no nodules. Skin: No rashes, bruises or suspicious lesions. MSK: Wearning back brace, ambulating with cane Neurologic: Grossly intact, no  focal deficits, moving all 4 extremities. Psychiatric: Normal mood and affect.  Laboratory Data: Lab Results  Component Value Date   WBC 6.9 12/07/2017   HGB 11.2 (L) 12/07/2017   HCT 33.5 (L) 12/07/2017   MCV 88.1 12/07/2017   PLT 227 12/07/2017    Lab Results  Component Value Date   CREATININE 0.89 12/07/2017     Lab Results  Component Value Date   HGBA1C 5.4 03/01/2013    Urinalysis Lab Results  Component Value Date   SPECGRAV 1.025 10/25/2017   PHUR 5.5 10/25/2017   COLORU Yellow 10/25/2017   APPEARANCEUR Clear 10/25/2017   LEUKOCYTESUR Trace (A) 10/25/2017   PROTEINUR Negative 10/25/2017   GLUCOSEU Negative 10/25/2017   KETONESU Negative 10/25/2017   RBCU 1+ (A) 10/25/2017   BILIRUBINUR Negative 10/25/2017   UUROB 0.2 10/25/2017   NITRITE Negative 10/25/2017    Lab Results  Component Value Date  LABMICR See below: 10/25/2017   WBCUA 0-5 10/25/2017   RBCUA 3-10 (A) 10/25/2017   LABEPIT None seen 10/25/2017   MUCUS Present (A) 10/25/2017   BACTERIA Few (A) 10/25/2017    Pertinent Imaging: n/a  Assessment & Plan:     1. Elevated PSA  We reviewed the implications of an elevated PSA and the uncertainty surrounding it. In general, a man's PSA increases with age and is produced by both normal and cancerous prostate tissue. Differential for elevated PSA is BPH, prostate cancer, infection, recent intercourse/ejaculation, prostate infarction, recent urethroscopic manipulation (foley placement/cystoscopy) and prostatitis. Management of an elevated PSA can include observation or prostate biopsy and wediscussed this in detail. We discussed that indications for prostate biopsy are defined by age and race specific PSA cutoffs as well as a PSA velocity of 0.75/year.  On the most recent PSA to 4.19.  Plan for repeat PSA in 1 month.  Given his age, will likely continue to trend this depending on his repeat levels.  If it continues to rise, may consider prostate  biopsy.  Rectal exam today is reassuring.  2. BPH without urinary obstruction Continue Flomax  3. Personal history of malignant neoplasm of bladder Due for surveillance cystoscopy in 10/2018   Return for PSA lab only in 1 months (will call with recs).  Hollice Espy, MD  [facility] [address] [place], [place] [address] [phone]

## 2018-01-26 ENCOUNTER — Other Ambulatory Visit: Payer: Self-pay

## 2018-01-26 ENCOUNTER — Other Ambulatory Visit: Payer: Medicare Other

## 2018-01-26 DIAGNOSIS — R972 Elevated prostate specific antigen [PSA]: Secondary | ICD-10-CM

## 2018-01-27 LAB — PSA: PROSTATE SPECIFIC AG, SERUM: 4.8 ng/mL — AB (ref 0.0–4.0)

## 2018-01-29 ENCOUNTER — Telehealth: Payer: Self-pay | Admitting: Urology

## 2018-01-29 NOTE — Telephone Encounter (Signed)
Please advise 

## 2018-01-29 NOTE — Telephone Encounter (Signed)
Pt called asking for lab results from PSA drawn last Friday morning.

## 2018-01-30 NOTE — Telephone Encounter (Signed)
Pt came by office asking for PSA results.  I told him Erlene Quan was in surgery all day Monday and clinic today.  I told him nurse would notify him with results.  He requested a call on his cell#, since he goes back to work tomorrow.

## 2018-02-07 NOTE — Telephone Encounter (Signed)
-----   Message from Hollice Espy, MD sent at 02/05/2018  2:09 PM EST ----- PSA is rising, most recently to 4.8.  Lets repeat again in 1 month and if continues to rise, let us plan for prostate biopsy.  If he has any concerns about this plan, encourage him to make a follow-up visit with me.    Please arrange for lab visit in 1 month.    Hollice Espy, MD

## 2018-02-07 NOTE — Telephone Encounter (Signed)
Spoke with patient answered all questions, made lab app for 1 month.  Trevor [name]

## 2018-02-21 ENCOUNTER — Other Ambulatory Visit: Payer: Self-pay

## 2018-02-21 DIAGNOSIS — R972 Elevated prostate specific antigen [PSA]: Secondary | ICD-10-CM

## 2018-02-23 ENCOUNTER — Other Ambulatory Visit: Payer: Medicare Other

## 2018-02-23 ENCOUNTER — Encounter: Payer: Self-pay | Admitting: Urology

## 2018-02-23 DIAGNOSIS — R972 Elevated prostate specific antigen [PSA]: Secondary | ICD-10-CM

## 2018-02-24 LAB — PSA: Prostate Specific Ag, Serum: 3.1 ng/mL (ref 0.0–4.0)

## 2018-02-26 ENCOUNTER — Telehealth: Payer: Self-pay

## 2018-02-26 NOTE — Telephone Encounter (Signed)
-----   Message from Hollice Espy, MD sent at 02/25/2018  2:58 PM EDT ----- Please let this patient know that his PSA return to around his baseline, back down to 3.1.  Great news.  We will recheck next.  No need for biopsy for further eval at this time.   Hollice Espy, MD

## 2018-02-26 NOTE — Telephone Encounter (Signed)
Spoke with pt in reference to PSA and no need for bx at this time. Pt voiced understanding.

## 2018-04-07 ENCOUNTER — Other Ambulatory Visit: Payer: Self-pay

## 2018-04-07 ENCOUNTER — Emergency Department
Admission: EM | Admit: 2018-04-07 | Discharge: 2018-04-07 | Disposition: A | Discharge status: Home / Self Care | Payer: Medicare Other | Attending: Emergency Medicine | Admitting: Emergency Medicine

## 2018-04-07 ENCOUNTER — Encounter: Payer: Self-pay | Admitting: Emergency Medicine

## 2018-04-07 DIAGNOSIS — L089 Local infection of the skin and subcutaneous tissue, unspecified: Secondary | ICD-10-CM | POA: Insufficient documentation

## 2018-04-07 DIAGNOSIS — I10 Essential (primary) hypertension: Secondary | ICD-10-CM | POA: Insufficient documentation

## 2018-04-07 DIAGNOSIS — Z79899 Other long term (current) drug therapy: Secondary | ICD-10-CM | POA: Insufficient documentation

## 2018-04-07 DIAGNOSIS — R21 Rash and other nonspecific skin eruption: Secondary | ICD-10-CM | POA: Diagnosis present

## 2018-04-07 DIAGNOSIS — Z7982 Long term (current) use of aspirin: Secondary | ICD-10-CM | POA: Insufficient documentation

## 2018-04-07 DIAGNOSIS — E785 Hyperlipidemia, unspecified: Secondary | ICD-10-CM | POA: Insufficient documentation

## 2018-04-07 DIAGNOSIS — B369 Superficial mycosis, unspecified: Secondary | ICD-10-CM

## 2018-04-07 MED ORDER — FLUCONAZOLE 150 MG PO TABS: 150.0000 mg | ORAL_TABLET | ORAL | 0 refills | Status: DC

## 2018-04-07 MED ORDER — KETOCONAZOLE 2 % EX CREA: 1.0000 "application " | TOPICAL_CREAM | Freq: Every day | CUTANEOUS | 1 refills | Status: DC

## 2018-04-07 NOTE — ED Notes (Signed)
Pt states redness on his left inner thigh that is burning

## 2018-04-07 NOTE — ED Triage Notes (Signed)
Pt to ED via POV for rash on his groin. Pt states that he just noticed today. Pt states that the area is painful. Pt in NAD at this time.

## 2018-04-07 NOTE — ED Provider Notes (Signed)
[facility] Emergency Department Provider Note  ____________________________________________  Time seen: Approximately 6:26 PM  I have reviewed the triage vital signs and the nursing notes.   HISTORY  Chief Complaint Rash    HPI Trevor [name] is a 72 y.o. male he presents the emergency department complaining of rash to the left groin.  Patient reports that he was outside today, mowing his grass.  When he went into take a shower, he had a burning sensation while washing in the left groin.  On visualization, patient had an erythematous rash in a circular distribution to the left groin.  Patient does not remember seeing rash prior to today.  Patient denies any other complaints at this time.  No medications for this complaint prior to arrival.  Past Medical History:  Diagnosis Date  . Acne   . Allergic rhinitis   . Anemia   . Anxiety   . BPH (benign prostatic hypertrophy) with urinary obstruction   . Cancer [facility])    bladder CA; remission since Oct 2014  . Carpal tunnel syndrome   . Cervicalgia   . Degenerative disc disease, cervical    neck and back;   . Depression    controlled;   Marland Kitchen GERD (gastroesophageal reflux disease)   . Hemorrhoids   . History of malignant neoplasm of bladder   . Hyperlipidemia   . Hypertension    controlled with medication;   . Hypospadias   . Low back pain   . Mitral regurgitation   . Sleep apnea   . Urinary frequency     Patient Active Problem List   Diagnosis Date Noted  . Nocturia 10/23/2015  . Absolute anemia 05/13/2014  . Benign essential HTN 05/13/2014  . Blood glucose elevated 05/13/2014  . LBP (low back pain) 05/13/2014  . HLD (hyperlipidemia) 05/13/2014  . Malignant neoplasm of lateral wall of urinary bladder ([place]) 09/19/2013  . Benign prostatic hypertrophy without urinary obstruction 12/10/2012  . Hypospadias 12/10/2012  . Microscopic hematuria 12/10/2012  . History of neoplasm of bladder 12/10/2012  . FOM  (frequency of micturition) 12/10/2012    Past Surgical History:  Procedure Laterality Date  . BLADDER SURGERY     2011, 2013, 2015  . COLONOSCOPY WITH PROPOFOL N/A 07/31/2015   Procedure: COLONOSCOPY WITH PROPOFOL;  Surgeon: Manya Silvas, MD;  Location[facility] ENDOSCOPY;  Service: Endoscopy;  Laterality: N/A;  . COLONOSCOPY, ESOPHAGOGASTRODUODENOSCOPY (EGD) AND ESOPHAGEAL DILATION    . FLEXIBLE SIGMOIDOSCOPY    . SEPTOPLASTY    . TONSILLECTOMY      Prior to Admission medications   Medication Sig Start Date End Date Taking? Authorizing Provider  aspirin 81 MG tablet Take 81 mg by mouth daily.    [provider]  b complex vitamins capsule Take 1 capsule by mouth daily.    [provider]  cetirizine (ZYRTEC) 10 MG tablet Take 10 mg by mouth daily.    [provider]  fluconazole (DIFLUCAN) 150 MG tablet Take 1 tablet (150 mg total) by mouth once a week. 04/07/18   Lagena Strand, Charline Bills, PA-C  HYDROcodone-acetaminophen (NORCO/VICODIN) 5-325 MG tablet Take 1-2 tablets by mouth every 4 (four) hours as needed for moderate pain or severe pain. 1 tab for moderate pain and 2 tabs for severe pain 11/29/17   Toni Arthurs, NP  ibuprofen (ADVIL,MOTRIN) 600 MG tablet Take 600 mg by mouth every 6 (six) hours as needed.    [provider]  ketoconazole (NIZORAL) 2 % cream  Apply 1 application topically daily. 04/07/18   Severo Beber, Charline Bills, PA-C  lisinopril-hydrochlorothiazide (PRINZIDE,ZESTORETIC) 10-12.5 MG tablet  09/24/15   [provider]  methocarbamol (ROBAXIN) 500 MG tablet  12/13/17   [provider]  Misc Natural Products (URINOZINC PLUS) TABS Take by mouth.    [provider]  Multiple Vitamin (MULTIVITAMIN WITH MINERALS) TABS tablet Take 1 tablet by mouth daily.    [provider]  omeprazole (PRILOSEC OTC) 20 MG tablet Take 20 mg by mouth daily.    [provider]  oxyCODONE (OXY IR/ROXICODONE) 5 MG immediate  release tablet Take 1 tablet (5 mg total) by mouth every 6 (six) hours as needed. For pain score >/= 3/10 11/27/17   Toni Arthurs, NP  senna-docusate (SENOKOT-S) 8.6-50 MG tablet Take by mouth. 11/23/17 11/23/18  [provider]  simvastatin (ZOCOR) 40 MG tablet Take 40 mg by mouth daily.    [provider]  sucralfate (CARAFATE) 1 G tablet Take 1.5 g by mouth 4 (four) times daily -  with meals and at bedtime.    [provider]  tamsulosin (FLOMAX) 0.4 MG CAPS capsule TAKE 1 CAPSULE DAILY 12/12/17   Ernestine Conrad, Larene Beach A, PA-C  traZODone (DESYREL) 100 MG tablet  11/13/17   [provider]    Allergies Patient has no known allergies.  Family History  Problem Relation Age of Onset  . Hyperlipidemia Brother   . Hyperlipidemia Sister     Social History Social History   Tobacco Use  . Smoking status: Never Smoker  . Smokeless tobacco: Never Used  Substance Use Topics  . Alcohol use: No    Alcohol/week: 0.0 oz  . Drug use: No     Review of Systems  Constitutional: No fever/chills Eyes: No visual changes.  Cardiovascular: no chest pain. Respiratory: no cough. No SOB. Gastrointestinal: No abdominal pain.  No nausea, no vomiting.  No diarrhea.  No constipation. Genitourinary: Negative for dysuria. No hematuria Musculoskeletal: Negative for musculoskeletal pain. Skin: Positive for erythematous, circular rash to the left groin Neurological: Negative for headaches, focal weakness or numbness. 10-point ROS otherwise negative.  ____________________________________________   PHYSICAL EXAM:  VITAL SIGNS: ED Triage Vitals  Enc Vitals Group     BP 04/07/18 1646 (!) 127/56     Pulse Rate 04/07/18 1646 82     Resp 04/07/18 1646 16     Temp 04/07/18 1646 98.9 F (37.2 C)     Temp Source 04/07/18 1646 Oral     SpO2 04/07/18 1646 99 %     Weight 04/07/18 1648 180 lb (81.6 kg)     Height 04/07/18 1648 5\' 8"  (1.727 m)     Head Circumference --       Peak Flow --      Pain Score 04/07/18 1647 2     Pain Loc --      Pain Edu? --      Excl. in [place]? --      Constitutional: Alert and oriented. Well appearing and in no acute distress. Eyes: Conjunctivae are normal. PERRL. EOMI. Head: Atraumatic. Neck: No stridor.    Cardiovascular: Normal rate, regular rhythm. Normal S1 and S2.  Good peripheral circulation. Respiratory: Normal respiratory effort without tachypnea or retractions. Lungs CTAB. Good air entry to the bases with no decreased or absent breath sounds. Gastrointestinal: Bowel sounds 4 quadrants. Soft and nontender to palpation. No guarding or rigidity. No palpable masses. No distention. No CVA tenderness. Musculoskeletal: Full range of motion  to all extremities. No gross deformities appreciated. Neurologic:  Normal speech and language. No gross focal neurologic deficits are appreciated.  Skin:  Skin is warm, dry and intact.  Patient with erythematous skin lesion to left groin.  This is split by the patient's inguinal skin fold.  There is a half-circle rash comprising a full circular rash with unfolding of the skin fold.  There is a raised erythematous border around rash with a slightly less erythematous coloring inside border.  Rashes consistent with fungal/ringworm Psychiatric: Mood and affect are normal. Speech and behavior are normal. Patient exhibits appropriate insight and judgement.   ____________________________________________   LABS (all labs ordered are listed, but only abnormal results are displayed)  Labs Reviewed - No data to display ____________________________________________  EKG   ____________________________________________  RADIOLOGY   No results found.  ____________________________________________    PROCEDURES  Procedure(s) performed:    Procedures    Medications - No data to display   ____________________________________________   INITIAL IMPRESSION / ASSESSMENT AND PLAN / ED  COURSE  Pertinent labs & imaging results that were available during my care of the patient were reviewed by me and considered in my medical decision making (see chart for details).  Review of the [place] CSRS was performed in accordance of the [place] prior to dispensing any controlled drugs.     Patient's diagnosis is consistent with fungal skin infection.  Patient presents with rash to left groin.  On visualization, this is most consistent with fungal/ringworm skin infection.  Patient will be treated with topical ketoconazole cream for symptom improvement.  If patient experiences no signs of improvement over the next 7 to 10 days, he is to start oral Diflucan, 1 pill weekly.  Patient verbalized understanding of same.  Patient will follow primary care as needed..Patient is given ED precautions to return to the ED for any worsening or new symptoms.     ____________________________________________  FINAL CLINICAL IMPRESSION(S) / ED DIAGNOSES  Final diagnoses:  Fungal skin infection      NEW MEDICATIONS STARTED DURING THIS VISIT:  ED Discharge Orders        Ordered    ketoconazole (NIZORAL) 2 % cream  Daily     04/07/18 1851    fluconazole (DIFLUCAN) 150 MG tablet  Weekly    Note to Pharmacy:  Take 1 tablet weekly for 6 weeks   04/07/18 1851          This chart was dictated using voice recognition software/Dragon. Despite best efforts to proofread, errors can occur which can change the meaning. Any change was purely unintentional.    Darletta Moll, PA-C 04/07/18 1854    Nena Polio, MD 04/12/18 [id number]

## 2018-06-07 ENCOUNTER — Encounter: Payer: Self-pay | Admitting: Emergency Medicine

## 2018-06-07 ENCOUNTER — Emergency Department
Admission: EM | Admit: 2018-06-07 | Discharge: 2018-06-07 | Disposition: A | Discharge status: Home / Self Care | Payer: Medicare Other | Attending: Emergency Medicine | Admitting: Emergency Medicine

## 2018-06-07 DIAGNOSIS — Z7982 Long term (current) use of aspirin: Secondary | ICD-10-CM | POA: Insufficient documentation

## 2018-06-07 DIAGNOSIS — M5417 Radiculopathy, lumbosacral region: Secondary | ICD-10-CM | POA: Insufficient documentation

## 2018-06-07 DIAGNOSIS — Z79899 Other long term (current) drug therapy: Secondary | ICD-10-CM | POA: Diagnosis not present

## 2018-06-07 DIAGNOSIS — I10 Essential (primary) hypertension: Secondary | ICD-10-CM | POA: Diagnosis not present

## 2018-06-07 DIAGNOSIS — Z8551 Personal history of malignant neoplasm of bladder: Secondary | ICD-10-CM | POA: Insufficient documentation

## 2018-06-07 DIAGNOSIS — M545 Low back pain: Secondary | ICD-10-CM | POA: Diagnosis present

## 2018-06-07 MED ORDER — KETOROLAC TROMETHAMINE 10 MG PO TABS: 10.0000 mg | ORAL_TABLET | Freq: Three times a day (TID) | ORAL | 0 refills | Status: DC

## 2018-06-07 MED ORDER — CYCLOBENZAPRINE HCL 5 MG PO TABS: 5.0000 mg | ORAL_TABLET | Freq: Three times a day (TID) | ORAL | 0 refills | Status: DC | PRN

## 2018-06-07 MED ORDER — HYDROCODONE-ACETAMINOPHEN 5-325 MG PO TABS: 1.0000 | ORAL_TABLET | Freq: Three times a day (TID) | ORAL | 0 refills | Status: AC | PRN

## 2018-06-07 MED ORDER — KETOROLAC TROMETHAMINE 30 MG/ML IJ SOLN
30.0000 mg | Freq: Once | INTRAMUSCULAR | Status: AC
  Administered 2018-06-07: 30 mg via INTRAMUSCULAR
  Filled 2018-06-07: qty 1

## 2018-06-07 NOTE — ED Triage Notes (Signed)
Pt reports cleaned gutters yesterday and thinks that what caused it.

## 2018-06-07 NOTE — ED Provider Notes (Signed)
[facility] Emergency Department Provider Note ____________________________________________  Time seen: 1028  I have reviewed the triage vital signs and the nursing notes.  HISTORY  Chief Complaint  Back Pain  HPI Trevor [name] is a 72 y.o. male presents himself to the ED for evaluation of increasing bilateral low back pain and lower extremity radicular symptoms.  Patient is status post a L3-5  XLIF/L5-S1 TLIF/L3-S1 PLIF in Dec. 2018 with Dr. Cari Caraway. He was released to regular duty as of March. He reports he was tolerating activities well and was painfree in the interim. He reports onset of pain about a week after he admittedly spent a day cleaning his gutters around the home back in May.  He describes onset of bilateral low back pain with referral down the buttocks into the posterior thighs.  He reports weakness and increasing pain at night.  Activity is also aggravated by prolonged standing and walking.  He is been evaluated by his neurologist in the interim and was placed on a steroid taper pack as well as muscle relaxants.  He does medications until they were complete denies any benefit symptoms.  Patient denies any falls, slips, trips, or foot drop.  He also denies any saddle paresthesias or incontinence.  He presents today because his symptoms have persisted.  Today while at work, he stooped down to pick up a piece of material that he had dropped, and immediately fell to his knees.  He was having significant pain in the hips and thighs and was not able to stand erect on his own power.  Coworker had to help him to his feet.  He has been in ED with family, for evaluation of his symptoms.  Past Medical History:  Diagnosis Date  . Acne   . Allergic rhinitis   . Anemia   . Anxiety   . BPH (benign prostatic hypertrophy) with urinary obstruction   . Cancer [facility])    bladder CA; remission since Oct 2014  . Carpal tunnel syndrome   . Cervicalgia   . Degenerative disc  disease, cervical    neck and back;   . Depression    controlled;   Marland Kitchen GERD (gastroesophageal reflux disease)   . Hemorrhoids   . History of malignant neoplasm of bladder   . Hyperlipidemia   . Hypertension    controlled with medication;   . Hypospadias   . Low back pain   . Mitral regurgitation   . Sleep apnea   . Urinary frequency     Patient Active Problem List   Diagnosis Date Noted  . Nocturia 10/23/2015  . Absolute anemia 05/13/2014  . Benign essential HTN 05/13/2014  . Blood glucose elevated 05/13/2014  . LBP (low back pain) 05/13/2014  . HLD (hyperlipidemia) 05/13/2014  . Malignant neoplasm of lateral wall of urinary bladder ([place]) 09/19/2013  . Benign prostatic hypertrophy without urinary obstruction 12/10/2012  . Hypospadias 12/10/2012  . Microscopic hematuria 12/10/2012  . History of neoplasm of bladder 12/10/2012  . FOM (frequency of micturition) 12/10/2012    Past Surgical History:  Procedure Laterality Date  . BLADDER SURGERY     2011, 2013, 2015  . COLONOSCOPY WITH PROPOFOL N/A 07/31/2015   Procedure: COLONOSCOPY WITH PROPOFOL;  Surgeon: Manya Silvas, MD;  Location: [facility] ENDOSCOPY;  Service: Endoscopy;  Laterality: N/A;  . COLONOSCOPY, ESOPHAGOGASTRODUODENOSCOPY (EGD) AND ESOPHAGEAL DILATION    . FLEXIBLE SIGMOIDOSCOPY    . SEPTOPLASTY    . TONSILLECTOMY  Prior to Admission medications   Medication Sig Start Date End Date Taking? Authorizing Provider  aspirin 81 MG tablet Take 81 mg by mouth daily.    [provider]  b complex vitamins capsule Take 1 capsule by mouth daily.    [provider]  cetirizine (ZYRTEC) 10 MG tablet Take 10 mg by mouth daily.    [provider]  cyclobenzaprine (FLEXERIL) 5 MG tablet Take 1 tablet (5 mg total) by mouth 3 (three) times daily as needed for muscle spasms. 06/07/18   [place] Bills, Dannielle Karvonen, PA-C  fluconazole (DIFLUCAN) 150 MG tablet Take 1 tablet (150 mg total) by mouth once a  week. 04/07/18   Cuthriell, Charline Bills, PA-C  HYDROcodone-acetaminophen (NORCO) 5-325 MG tablet Take 1 tablet by mouth 3 (three) times daily as needed for up to 5 days. 06/07/18 06/12/18  Alanie Syler, Dannielle Karvonen, PA-C  ibuprofen (ADVIL,MOTRIN) 600 MG tablet Take 600 mg by mouth every 6 (six) hours as needed.    [provider]  ketoconazole (NIZORAL) 2 % cream Apply 1 application topically daily. 04/07/18   Cuthriell, Charline Bills, PA-C  ketorolac (TORADOL) 10 MG tablet Take 1 tablet (10 mg total) by mouth every 8 (eight) hours. 06/07/18   Cornell Bourbon, Dannielle Karvonen, PA-C  lisinopril-hydrochlorothiazide (PRINZIDE,ZESTORETIC) 10-12.5 MG tablet  09/24/15   [provider]  methocarbamol (ROBAXIN) 500 MG tablet  12/13/17   [provider]  Misc Natural Products (URINOZINC PLUS) TABS Take by mouth.    [provider]  Multiple Vitamin (MULTIVITAMIN WITH MINERALS) TABS tablet Take 1 tablet by mouth daily.    [provider]  omeprazole (PRILOSEC OTC) 20 MG tablet Take 20 mg by mouth daily.    [provider]  senna-docusate (SENOKOT-S) 8.6-50 MG tablet Take by mouth. 11/23/17 11/23/18  [provider]  simvastatin (ZOCOR) 40 MG tablet Take 40 mg by mouth daily.    [provider]  sucralfate (CARAFATE) 1 G tablet Take 1.5 g by mouth 4 (four) times daily -  with meals and at bedtime.    [provider]  tamsulosin (FLOMAX) 0.4 MG CAPS capsule TAKE 1 CAPSULE DAILY 12/12/17   Ernestine Conrad, Larene Beach A, PA-C  traZODone (DESYREL) 100 MG tablet  11/13/17   [provider]    Allergies Patient has no known allergies.  Family History  Problem Relation Age of Onset  . Hyperlipidemia Brother   . Hyperlipidemia Sister     Social History Social History   Tobacco Use  . Smoking status: Never Smoker  . Smokeless tobacco: Never Used  Substance Use Topics  . Alcohol use: No    Alcohol/week: 0.0 oz  . Drug use: No    Review of  Systems  Constitutional: Negative for fever. Cardiovascular: Negative for chest pain. Respiratory: Negative for shortness of breath. Gastrointestinal: Negative for abdominal pain, vomiting and diarrhea. Genitourinary: Negative for dysuria. Musculoskeletal: Positive for back pain.  Bilateral paresthesias as above. Skin: Negative for rash. Neurological: Negative for headaches, focal weakness or numbness. ____________________________________________  PHYSICAL EXAM:  VITAL SIGNS: ED Triage Vitals  Enc Vitals Group     BP 06/07/18 1013 (!) 142/115     Pulse Rate 06/07/18 1013 75     Resp 06/07/18 1013 20     Temp 06/07/18 1014 98.5 F (36.9 C)     Temp Source 06/07/18 1014 Oral     SpO2 06/07/18 1013 98 %     Weight 06/07/18 1015 185 lb (83.9  kg)     Height 06/07/18 1015 5\' 8"  (1.727 m)     Head Circumference --      Peak Flow --      Pain Score 06/07/18 1015 2     Pain Loc --      Pain Edu? --      Excl. in [place]? --     Constitutional: Alert and oriented. Well appearing and in no distress. Head: Normocephalic and atraumatic. Cardiovascular: Normal rate, regular rhythm. Normal distal pulses. Respiratory: Normal respiratory effort. No wheezes/rales/rhonchi. Gastrointestinal: Soft and nontender. No distention. Musculoskeletal: Normal spinal alignment without midline tenderness, spasm, deformity, step-off.  Patient will well-healed surgical scars to the left and right of midline consistent with his surgical history.  Patient transitions from sit to stand with some difficulty due to weakness of the bilateral hips and glutes.  He also has some difficulty with single leg lifts on the left.  Lumbar extension range is full and active.  Negative seated straight leg raise bilaterally.  Nontender with normal range of motion in all extremities.  Neurologic: Cranial nerves II through XII grossly intact.  Normal LE DTRs bilaterally.  Normal toe dorsiflexion and foot eversion noted.  Normal gait  without ataxia. Normal speech and language. No gross focal neurologic deficits are appreciated. Skin:  Skin is warm, dry and intact. No rash noted. ____________________________________________   RADIOLOGY  deferred ____________________________________________  PROCEDURES  Procedures  Toradol 30 mg IM ____________________________________________  INITIAL IMPRESSION / ASSESSMENT AND PLAN / ED COURSE  Presents himself to the ED for evaluation of lumbar radicular symptoms.  Patient with a remote lumbar fusion surgery was recently evaluated by his neurologist.  He is pending a referral to Dr. Sharlet Salina for probable ESI.  He denies any interim falls, incontinence, or footdrop.  He does experience some leg weakness and lower extremity discomfort.  Patient will be discharged with a prescription for ketorolac, Flexeril, and hydrocodone.  He will follow-up with his primary provider or neurologist next month as scheduled.  Return precautions have been reviewed with the patient.  I reviewed the patient's prescription history over the last 12 months in the multi-state controlled substances database(s) that includes [place], [place], [place], [place], [place], [place], [place], [place], [place], [place], [place], [place], [place], and [place].  Results were notable for no recent prescriptions.  ____________________________________________  FINAL CLINICAL IMPRESSION(S) / ED DIAGNOSES  Final diagnoses:  Lumbosacral radiculopathy      Carmie End, Dannielle Karvonen, PA-C 06/07/18 1333    Arta Silence, MD 06/07/18 1557

## 2018-06-07 NOTE — ED Triage Notes (Signed)
Pt reports injured his back after working in the yard and had back surgery in December. Pt reports still has intermittent pain to his lower back and legs. Pt states this am the pain came again and he needs to know why he keeps having pain.

## 2018-06-07 NOTE — ED Notes (Signed)
See triage note  Presents with lower back pain  States pain started after working in yard  Denies any fall or bowel or bladder problems  Ambulates slowly to room

## 2018-06-07 NOTE — ED Triage Notes (Signed)
Pt reports several months ago was working in the yard and started with back pain. Pt states he had back surgery in December and thought it would be ok but he is still having intermittent pain tohis lower back and legs. Pt repor

## 2018-06-07 NOTE — Discharge Instructions (Signed)
Your exam is consistent with lumbar radicular symptoms. Take the prescription meds as directed. Follow-up with Dr. Sharlet Salina as scheduled. Return to the ED immediately for worsening symptoms of pain and weakness, as discussed.

## 2018-06-13 ENCOUNTER — Ambulatory Visit
Admission: RE | Admit: 2018-06-13 | Discharge: 2018-06-13 | Disposition: A | Discharge status: Home / Self Care | Payer: Medicare Other | Source: Ambulatory Visit | Attending: Neurosurgery | Admitting: Neurosurgery

## 2018-06-13 ENCOUNTER — Other Ambulatory Visit: Payer: Self-pay | Admitting: Neurosurgery

## 2018-06-13 DIAGNOSIS — R531 Weakness: Secondary | ICD-10-CM | POA: Diagnosis present

## 2018-06-13 DIAGNOSIS — G8929 Other chronic pain: Secondary | ICD-10-CM

## 2018-06-13 DIAGNOSIS — M4804 Spinal stenosis, thoracic region: Secondary | ICD-10-CM | POA: Insufficient documentation

## 2018-06-13 DIAGNOSIS — Z981 Arthrodesis status: Secondary | ICD-10-CM | POA: Insufficient documentation

## 2018-06-13 DIAGNOSIS — M5124 Other intervertebral disc displacement, thoracic region: Secondary | ICD-10-CM | POA: Diagnosis not present

## 2018-06-13 DIAGNOSIS — R2689 Other abnormalities of gait and mobility: Secondary | ICD-10-CM

## 2018-06-13 DIAGNOSIS — M48061 Spinal stenosis, lumbar region without neurogenic claudication: Secondary | ICD-10-CM | POA: Insufficient documentation

## 2018-06-13 DIAGNOSIS — M5442 Lumbago with sciatica, left side: Principal | ICD-10-CM

## 2018-06-13 DIAGNOSIS — M5126 Other intervertebral disc displacement, lumbar region: Secondary | ICD-10-CM | POA: Insufficient documentation

## 2018-06-18 ENCOUNTER — Other Ambulatory Visit: Payer: Self-pay

## 2018-06-18 ENCOUNTER — Encounter
Admission: RE | Admit: 2018-06-18 | Discharge: 2018-06-18 | Disposition: A | Discharge status: Home / Self Care | Payer: Medicare Other | Source: Ambulatory Visit | Attending: Neurosurgery | Admitting: Neurosurgery

## 2018-06-18 DIAGNOSIS — Z79899 Other long term (current) drug therapy: Secondary | ICD-10-CM | POA: Insufficient documentation

## 2018-06-18 DIAGNOSIS — E785 Hyperlipidemia, unspecified: Secondary | ICD-10-CM | POA: Diagnosis not present

## 2018-06-18 DIAGNOSIS — I10 Essential (primary) hypertension: Secondary | ICD-10-CM | POA: Insufficient documentation

## 2018-06-18 DIAGNOSIS — D649 Anemia, unspecified: Secondary | ICD-10-CM | POA: Diagnosis not present

## 2018-06-18 DIAGNOSIS — F329 Major depressive disorder, single episode, unspecified: Secondary | ICD-10-CM | POA: Insufficient documentation

## 2018-06-18 DIAGNOSIS — Z01818 Encounter for other preprocedural examination: Secondary | ICD-10-CM | POA: Diagnosis not present

## 2018-06-18 DIAGNOSIS — R7303 Prediabetes: Secondary | ICD-10-CM | POA: Insufficient documentation

## 2018-06-18 DIAGNOSIS — Z01810 Encounter for preprocedural cardiovascular examination: Secondary | ICD-10-CM | POA: Diagnosis not present

## 2018-06-18 DIAGNOSIS — M48061 Spinal stenosis, lumbar region without neurogenic claudication: Secondary | ICD-10-CM | POA: Diagnosis not present

## 2018-06-18 DIAGNOSIS — Z7982 Long term (current) use of aspirin: Secondary | ICD-10-CM | POA: Insufficient documentation

## 2018-06-18 DIAGNOSIS — K219 Gastro-esophageal reflux disease without esophagitis: Secondary | ICD-10-CM | POA: Diagnosis not present

## 2018-06-18 DIAGNOSIS — F419 Anxiety disorder, unspecified: Secondary | ICD-10-CM | POA: Diagnosis not present

## 2018-06-18 HISTORY — DX: Prediabetes: R73.03

## 2018-06-18 LAB — SURGICAL PCR SCREEN
MRSA, PCR: NEGATIVE
Staphylococcus aureus: NEGATIVE

## 2018-06-18 LAB — URINALYSIS, ROUTINE W REFLEX MICROSCOPIC
BILIRUBIN URINE: NEGATIVE
Bacteria, UA: NONE SEEN
Glucose, UA: NEGATIVE mg/dL
Ketones, ur: NEGATIVE mg/dL
Nitrite: NEGATIVE
Protein, ur: NEGATIVE mg/dL
Specific Gravity, Urine: 1.018 (ref 1.005–1.030)
pH: 5 (ref 5.0–8.0)

## 2018-06-18 LAB — CBC
HCT: 37.9 % — ABNORMAL LOW (ref 40.0–52.0)
Hemoglobin: 13.1 g/dL (ref 13.0–18.0)
MCH: 30.3 pg (ref 26.0–34.0)
MCHC: 34.6 g/dL (ref 32.0–36.0)
MCV: 87.7 fL (ref 80.0–100.0)
Platelets: 149 10*3/uL — ABNORMAL LOW (ref 150–440)
RBC: 4.32 MIL/uL — AB (ref 4.40–5.90)
RDW: 13.1 % (ref 11.5–14.5)
WBC: 7.3 10*3/uL (ref 3.8–10.6)

## 2018-06-18 LAB — BASIC METABOLIC PANEL
ANION GAP: 7 (ref 5–15)
BUN: 22 mg/dL (ref 8–23)
CALCIUM: 9.4 mg/dL (ref 8.9–10.3)
CO2: 28 mmol/L (ref 22–32)
Chloride: 101 mmol/L (ref 98–111)
Creatinine, Ser: 0.89 mg/dL (ref 0.61–1.24)
Glucose, Bld: 98 mg/dL (ref 70–99)
POTASSIUM: 4.3 mmol/L (ref 3.5–5.1)
SODIUM: 136 mmol/L (ref 135–145)

## 2018-06-18 LAB — TYPE AND SCREEN
ABO/RH(D): A POS
Antibody Screen: NEGATIVE

## 2018-06-18 LAB — PROTIME-INR
INR: 1.11
PROTHROMBIN TIME: 14.2 s (ref 11.4–15.2)

## 2018-06-18 LAB — APTT: aPTT: 29 seconds (ref 24–36)

## 2018-06-18 NOTE — Patient Instructions (Signed)
  Your procedure is scheduled on: Wednesday June 20, 2018 Report to Same Day Surgery 2nd floor medical mall ([place] Entrance-take elevator on left to 2nd floor.  Check in with surgery information desk.) To find out your arrival time please call [phone] between 1PM - 3PM on Tuesday June 19, 2018  Remember: Instructions that are not followed completely may result in serious medical risk, up to and including death, or upon the discretion of your surgeon and anesthesiologist your surgery may need to be rescheduled.    _x___ 1. Do not eat food after midnight the night before your procedure. You may drink clear liquids up to 2 hours before you are scheduled to arrive at the hospital for your procedure.  Do not drink clear liquids within 2 hours of your scheduled arrival to the hospital.  Clear liquids include  --Water or Apple juice without pulp  --Clear carbohydrate beverage such as Gatorade  --Black Coffee or Clear Tea (No milk, no creamers, do not add anything to the coffee or tea   No gum chewing or hard candies.      __x__ 2. No Alcohol for 24 hours before or after surgery.   __x__3. No Smoking or e-cigarettes for 24 prior to surgery.  Do not use any chewable tobacco products for at least 6 hour prior to surgery   ____  4. Bring all medications with you on the day of surgery if instructed.    __x__ 5. Notify your doctor if there is any change in your medical condition     (cold, fever, infections).   __x__6. On the morning of surgery brush your teeth with toothpaste and water.  You may rinse your mouth with mouth wash if you wish.  Do not swallow any toothpaste or mouthwash.   Do not wear jewelry.  Do not wear lotions, powders, deodorant, or perfumes.   Do not bring valuables to the hospital.    [facility] is not responsible for any belongings or valuables.               Contacts, dentures or bridgework may not be worn into surgery.  Leave your suitcase in the car. After  surgery it may be brought to your room.  For patients admitted to the hospital, discharge time is determined by your treatment team.  Please read over the following fact sheets that you were given:   [facility] Preparing for Surgery and or MRSA Information   _x___ Take anti-hypertensive listed below, cardiac, seizure, asthma, anti-reflux and psychiatric medicines. These include:  1. Omeprazole/Prilosec  _x___ Use CHG Soap or sage wipes as directed on instruction sheet   _x___ Follow recommendations from Cardiologist, Pulmonologist or PCP regarding stopping Aspirin, Coumadin, Plavix ,Eliquis, Effient, or Pradaxa, and Pletal.  _x___ Stop Anti-inflammatories such as Advil, Aleve, Ibuprofen, Motrin, Naproxen, Naprosyn, Goodies powders or aspirin products. OK to take Tylenol and Celebrex.   _x___ NOW: Stop supplements (Prune Lax and Omega XL) until after surgery.  But may continue Vitamin D, Vitamin B, and multivitamin.

## 2018-06-18 NOTE — Pre-Procedure Instructions (Signed)
This RN faxed CBC & UA results to Dr. Rhea Bleacher office.

## 2018-06-20 ENCOUNTER — Encounter: Payer: Self-pay | Admitting: *Deleted

## 2018-06-20 ENCOUNTER — Observation Stay
Admission: RE | Admit: 2018-06-20 | Discharge: 2018-06-22 | Disposition: A | Discharge status: Other Acute Inpatient Hospital | Payer: Medicare Other | Source: Ambulatory Visit | Attending: Neurosurgery | Admitting: Neurosurgery

## 2018-06-20 ENCOUNTER — Other Ambulatory Visit: Payer: Self-pay

## 2018-06-20 ENCOUNTER — Ambulatory Visit: Payer: Medicare Other

## 2018-06-20 ENCOUNTER — Ambulatory Visit: Payer: Medicare Other | Admitting: Anesthesiology

## 2018-06-20 ENCOUNTER — Encounter
Admission: RE | Disposition: A | Discharge status: Other Acute Inpatient Hospital | Payer: Self-pay | Source: Ambulatory Visit | Attending: Neurosurgery

## 2018-06-20 DIAGNOSIS — F329 Major depressive disorder, single episode, unspecified: Secondary | ICD-10-CM | POA: Diagnosis not present

## 2018-06-20 DIAGNOSIS — N401 Enlarged prostate with lower urinary tract symptoms: Secondary | ICD-10-CM | POA: Insufficient documentation

## 2018-06-20 DIAGNOSIS — Z981 Arthrodesis status: Secondary | ICD-10-CM | POA: Diagnosis not present

## 2018-06-20 DIAGNOSIS — M48061 Spinal stenosis, lumbar region without neurogenic claudication: Secondary | ICD-10-CM | POA: Diagnosis not present

## 2018-06-20 DIAGNOSIS — K219 Gastro-esophageal reflux disease without esophagitis: Secondary | ICD-10-CM | POA: Insufficient documentation

## 2018-06-20 DIAGNOSIS — Z8551 Personal history of malignant neoplasm of bladder: Secondary | ICD-10-CM | POA: Insufficient documentation

## 2018-06-20 DIAGNOSIS — F419 Anxiety disorder, unspecified: Secondary | ICD-10-CM | POA: Insufficient documentation

## 2018-06-20 DIAGNOSIS — G4730 Sleep apnea, unspecified: Secondary | ICD-10-CM | POA: Diagnosis not present

## 2018-06-20 DIAGNOSIS — Z419 Encounter for procedure for purposes other than remedying health state, unspecified: Secondary | ICD-10-CM

## 2018-06-20 DIAGNOSIS — Z79899 Other long term (current) drug therapy: Secondary | ICD-10-CM | POA: Insufficient documentation

## 2018-06-20 DIAGNOSIS — Z7982 Long term (current) use of aspirin: Secondary | ICD-10-CM | POA: Insufficient documentation

## 2018-06-20 DIAGNOSIS — E785 Hyperlipidemia, unspecified: Secondary | ICD-10-CM | POA: Insufficient documentation

## 2018-06-20 DIAGNOSIS — M5116 Intervertebral disc disorders with radiculopathy, lumbar region: Principal | ICD-10-CM | POA: Insufficient documentation

## 2018-06-20 DIAGNOSIS — R339 Retention of urine, unspecified: Secondary | ICD-10-CM | POA: Diagnosis not present

## 2018-06-20 DIAGNOSIS — I340 Nonrheumatic mitral (valve) insufficiency: Secondary | ICD-10-CM | POA: Insufficient documentation

## 2018-06-20 DIAGNOSIS — I10 Essential (primary) hypertension: Secondary | ICD-10-CM | POA: Diagnosis not present

## 2018-06-20 DIAGNOSIS — M5416 Radiculopathy, lumbar region: Secondary | ICD-10-CM | POA: Diagnosis present

## 2018-06-20 DIAGNOSIS — R7303 Prediabetes: Secondary | ICD-10-CM | POA: Diagnosis not present

## 2018-06-20 HISTORY — DX: Gastritis, unspecified, without bleeding: K29.70

## 2018-06-20 HISTORY — PX: LUMBAR LAMINECTOMY/DECOMPRESSION MICRODISCECTOMY: SHX5026

## 2018-06-20 LAB — ABO/RH: ABO/RH(D): A POS

## 2018-06-20 LAB — GLUCOSE, CAPILLARY
GLUCOSE-CAPILLARY: 120 mg/dL — AB (ref 70–99)
GLUCOSE-CAPILLARY: 143 mg/dL — AB (ref 70–99)

## 2018-06-20 SURGERY — LUMBAR LAMINECTOMY/DECOMPRESSION MICRODISCECTOMY 1 LEVEL
Anesthesia: General | Site: Back | Wound class: Clean

## 2018-06-20 MED ORDER — HYDROMORPHONE HCL 1 MG/ML IJ SOLN
INTRAMUSCULAR | Status: DC | PRN
  Administered 2018-06-20 (×2): 0.5 mg via INTRAVENOUS

## 2018-06-20 MED ORDER — ACETAMINOPHEN 500 MG PO TABS
1000.0000 mg | ORAL_TABLET | Freq: Four times a day (QID) | ORAL | Status: AC
  Administered 2018-06-21 (×3): 1000 mg via ORAL
  Filled 2018-06-20 (×3): qty 2

## 2018-06-20 MED ORDER — SUGAMMADEX SODIUM 200 MG/2ML IV SOLN
INTRAVENOUS | Status: AC
  Filled 2018-06-20: qty 2

## 2018-06-20 MED ORDER — ONDANSETRON HCL 4 MG/2ML IJ SOLN
INTRAMUSCULAR | Status: AC
  Filled 2018-06-20: qty 2

## 2018-06-20 MED ORDER — SODIUM CHLORIDE 0.9% FLUSH: 3.0000 mL | INTRAVENOUS | Status: DC | PRN

## 2018-06-20 MED ORDER — CEFAZOLIN SODIUM-DEXTROSE 2-4 GM/100ML-% IV SOLN
INTRAVENOUS | Status: AC
  Filled 2018-06-20: qty 100

## 2018-06-20 MED ORDER — PROPOFOL 10 MG/ML IV BOLUS
INTRAVENOUS | Status: DC | PRN
  Administered 2018-06-20 (×2): 30 mg via INTRAVENOUS
  Administered 2018-06-20: 120 mg via INTRAVENOUS
  Administered 2018-06-20: 20 mg via INTRAVENOUS

## 2018-06-20 MED ORDER — ONDANSETRON HCL 4 MG/2ML IJ SOLN: 4.0000 mg | Freq: Four times a day (QID) | INTRAMUSCULAR | Status: DC | PRN

## 2018-06-20 MED ORDER — FENTANYL CITRATE (PF) 100 MCG/2ML IJ SOLN
INTRAMUSCULAR | Status: AC
  Filled 2018-06-20: qty 2

## 2018-06-20 MED ORDER — LABETALOL HCL 5 MG/ML IV SOLN
INTRAVENOUS | Status: AC
  Filled 2018-06-20: qty 4

## 2018-06-20 MED ORDER — EPHEDRINE SULFATE 50 MG/ML IJ SOLN
INTRAMUSCULAR | Status: AC
  Filled 2018-06-20: qty 1

## 2018-06-20 MED ORDER — SODIUM CHLORIDE 0.9 % IV SOLN
INTRAVENOUS | Status: DC | PRN
  Administered 2018-06-20: 40 mL

## 2018-06-20 MED ORDER — FLUCONAZOLE 50 MG PO TABS: 150.0000 mg | ORAL_TABLET | ORAL | Status: DC

## 2018-06-20 MED ORDER — SODIUM CHLORIDE 0.9 % IV SOLN
INTRAVENOUS | Status: DC
  Administered 2018-06-20: 20:00:00 via INTRAVENOUS

## 2018-06-20 MED ORDER — SODIUM CHLORIDE 0.9% FLUSH
3.0000 mL | Freq: Two times a day (BID) | INTRAVENOUS | Status: DC
  Administered 2018-06-21 – 2018-06-22 (×3): 3 mL via INTRAVENOUS

## 2018-06-20 MED ORDER — FAMOTIDINE 20 MG PO TABS
ORAL_TABLET | ORAL | Status: AC
  Filled 2018-06-20: qty 1

## 2018-06-20 MED ORDER — FENTANYL CITRATE (PF) 100 MCG/2ML IJ SOLN
INTRAMUSCULAR | Status: AC
  Administered 2018-06-20: 25 ug via INTRAVENOUS
  Filled 2018-06-20: qty 2

## 2018-06-20 MED ORDER — MENTHOL 3 MG MT LOZG
1.0000 | LOZENGE | OROMUCOSAL | Status: DC | PRN
  Filled 2018-06-20: qty 9

## 2018-06-20 MED ORDER — ACETAMINOPHEN 325 MG PO TABS
650.0000 mg | ORAL_TABLET | ORAL | Status: DC | PRN
Start: 2018-06-20 — End: 2018-06-22

## 2018-06-20 MED ORDER — GLYCOPYRROLATE 0.2 MG/ML IJ SOLN
INTRAMUSCULAR | Status: DC | PRN
  Administered 2018-06-20: 0.2 mg via INTRAVENOUS

## 2018-06-20 MED ORDER — ACETAMINOPHEN 650 MG RE SUPP: 650.0000 mg | RECTAL | Status: DC | PRN

## 2018-06-20 MED ORDER — BACITRACIN 50000 UNITS IM SOLR
INTRAMUSCULAR | Status: DC | PRN
  Administered 2018-06-20: 1000 mL

## 2018-06-20 MED ORDER — KETAMINE HCL 50 MG/ML IJ SOLN
INTRAMUSCULAR | Status: DC | PRN
  Administered 2018-06-20: 25 mg via INTRAVENOUS

## 2018-06-20 MED ORDER — KETAMINE HCL 50 MG/ML IJ SOLN
INTRAMUSCULAR | Status: AC
  Filled 2018-06-20: qty 10

## 2018-06-20 MED ORDER — DEXAMETHASONE SODIUM PHOSPHATE 10 MG/ML IJ SOLN
INTRAMUSCULAR | Status: AC
  Filled 2018-06-20: qty 1

## 2018-06-20 MED ORDER — HYDROCHLOROTHIAZIDE 12.5 MG PO CAPS
12.5000 mg | ORAL_CAPSULE | Freq: Every day | ORAL | Status: DC
  Administered 2018-06-21 – 2018-06-22 (×2): 12.5 mg via ORAL
  Filled 2018-06-20 (×2): qty 1

## 2018-06-20 MED ORDER — PHENYLEPHRINE HCL 10 MG/ML IJ SOLN
INTRAMUSCULAR | Status: AC
  Filled 2018-06-20: qty 1

## 2018-06-20 MED ORDER — LISINOPRIL 10 MG PO TABS
10.0000 mg | ORAL_TABLET | Freq: Every day | ORAL | Status: DC
Start: 2018-06-20 — End: 2018-06-22
  Administered 2018-06-21 – 2018-06-22 (×2): 10 mg via ORAL
  Filled 2018-06-20 (×2): qty 1

## 2018-06-20 MED ORDER — LABETALOL HCL 5 MG/ML IV SOLN
INTRAVENOUS | Status: DC | PRN
  Administered 2018-06-20: 5 mg via INTRAVENOUS

## 2018-06-20 MED ORDER — MIDAZOLAM HCL 2 MG/2ML IJ SOLN
INTRAMUSCULAR | Status: AC
  Filled 2018-06-20: qty 2

## 2018-06-20 MED ORDER — FENTANYL CITRATE (PF) 100 MCG/2ML IJ SOLN
25.0000 ug | INTRAMUSCULAR | Status: DC | PRN
  Administered 2018-06-20 (×2): 25 ug via INTRAVENOUS

## 2018-06-20 MED ORDER — PROPOFOL 10 MG/ML IV BOLUS
INTRAVENOUS | Status: AC
  Filled 2018-06-20: qty 20

## 2018-06-20 MED ORDER — BUPIVACAINE-EPINEPHRINE (PF) 0.5% -1:200000 IJ SOLN
INTRAMUSCULAR | Status: AC
  Filled 2018-06-20: qty 30

## 2018-06-20 MED ORDER — SODIUM CHLORIDE FLUSH 0.9 % IV SOLN
INTRAVENOUS | Status: AC
  Filled 2018-06-20: qty 20

## 2018-06-20 MED ORDER — FENTANYL CITRATE (PF) 100 MCG/2ML IJ SOLN
INTRAMUSCULAR | Status: DC | PRN
  Administered 2018-06-20: 100 ug via INTRAVENOUS
  Administered 2018-06-20 (×2): 50 ug via INTRAVENOUS
  Administered 2018-06-20: 100 ug via INTRAVENOUS

## 2018-06-20 MED ORDER — OXYCODONE HCL 5 MG PO TABS: 10.0000 mg | ORAL_TABLET | ORAL | Status: DC | PRN

## 2018-06-20 MED ORDER — BUPIVACAINE HCL (PF) 0.5 % IJ SOLN
INTRAMUSCULAR | Status: AC
  Filled 2018-06-20: qty 30

## 2018-06-20 MED ORDER — BUPIVACAINE LIPOSOME 1.3 % IJ SUSP
INTRAMUSCULAR | Status: AC
  Filled 2018-06-20: qty 20

## 2018-06-20 MED ORDER — OXYCODONE HCL 5 MG PO TABS
5.0000 mg | ORAL_TABLET | ORAL | Status: DC | PRN
  Administered 2018-06-21 – 2018-06-22 (×4): 5 mg via ORAL
  Filled 2018-06-20 (×4): qty 1

## 2018-06-20 MED ORDER — TAMSULOSIN HCL 0.4 MG PO CAPS
0.4000 mg | ORAL_CAPSULE | Freq: Every day | ORAL | Status: DC
  Administered 2018-06-21 – 2018-06-22 (×2): 0.4 mg via ORAL
  Filled 2018-06-20 (×2): qty 1

## 2018-06-20 MED ORDER — ROCURONIUM BROMIDE 50 MG/5ML IV SOLN
INTRAVENOUS | Status: AC
  Filled 2018-06-20: qty 1

## 2018-06-20 MED ORDER — CELECOXIB 200 MG PO CAPS
200.0000 mg | ORAL_CAPSULE | Freq: Two times a day (BID) | ORAL | Status: DC
  Administered 2018-06-20 – 2018-06-22 (×4): 200 mg via ORAL
  Filled 2018-06-20 (×5): qty 1

## 2018-06-20 MED ORDER — DEXMEDETOMIDINE HCL IN NACL 200 MCG/50ML IV SOLN
INTRAVENOUS | Status: DC | PRN
  Administered 2018-06-20 (×2): 12 ug via INTRAVENOUS

## 2018-06-20 MED ORDER — MIDAZOLAM HCL 2 MG/2ML IJ SOLN
INTRAMUSCULAR | Status: DC | PRN
  Administered 2018-06-20: 2 mg via INTRAVENOUS

## 2018-06-20 MED ORDER — METHOCARBAMOL 1000 MG/10ML IJ SOLN
500.0000 mg | Freq: Four times a day (QID) | INTRAVENOUS | Status: DC | PRN
  Filled 2018-06-20: qty 5

## 2018-06-20 MED ORDER — HYDROMORPHONE HCL 1 MG/ML IJ SOLN
INTRAMUSCULAR | Status: AC
  Filled 2018-06-20: qty 1

## 2018-06-20 MED ORDER — METHOCARBAMOL 500 MG PO TABS
500.0000 mg | ORAL_TABLET | Freq: Four times a day (QID) | ORAL | Status: DC | PRN
  Filled 2018-06-20: qty 1

## 2018-06-20 MED ORDER — ONDANSETRON HCL 4 MG/2ML IJ SOLN
INTRAMUSCULAR | Status: DC | PRN
  Administered 2018-06-20: 4 mg via INTRAVENOUS

## 2018-06-20 MED ORDER — BUPIVACAINE HCL 0.5 % IJ SOLN
INTRAMUSCULAR | Status: DC | PRN
  Administered 2018-06-20: 20 mL

## 2018-06-20 MED ORDER — LISINOPRIL-HYDROCHLOROTHIAZIDE 10-12.5 MG PO TABS: 1.0000 | ORAL_TABLET | Freq: Every day | ORAL | Status: DC

## 2018-06-20 MED ORDER — SENNOSIDES-DOCUSATE SODIUM 8.6-50 MG PO TABS: 1.0000 | ORAL_TABLET | Freq: Every evening | ORAL | Status: DC | PRN

## 2018-06-20 MED ORDER — LACTATED RINGERS IV SOLN: INTRAVENOUS | Status: DC

## 2018-06-20 MED ORDER — ROCURONIUM BROMIDE 100 MG/10ML IV SOLN
INTRAVENOUS | Status: DC | PRN
  Administered 2018-06-20: 10 mg via INTRAVENOUS
  Administered 2018-06-20: 25 mg via INTRAVENOUS
  Administered 2018-06-20: 5 mg via INTRAVENOUS
  Administered 2018-06-20: 10 mg via INTRAVENOUS

## 2018-06-20 MED ORDER — SUCCINYLCHOLINE CHLORIDE 20 MG/ML IJ SOLN
INTRAMUSCULAR | Status: DC | PRN
  Administered 2018-06-20: 120 mg via INTRAVENOUS

## 2018-06-20 MED ORDER — METHYLPREDNISOLONE ACETATE 40 MG/ML IJ SUSP
INTRAMUSCULAR | Status: AC
  Filled 2018-06-20: qty 1

## 2018-06-20 MED ORDER — PHENOL 1.4 % MT LIQD
1.0000 | OROMUCOSAL | Status: DC | PRN
  Filled 2018-06-20: qty 177

## 2018-06-20 MED ORDER — BACITRACIN 50000 UNITS IM SOLR
INTRAMUSCULAR | Status: AC
  Filled 2018-06-20: qty 1

## 2018-06-20 MED ORDER — ACETAMINOPHEN 10 MG/ML IV SOLN
INTRAVENOUS | Status: DC | PRN
  Administered 2018-06-20: 1000 mg via INTRAVENOUS

## 2018-06-20 MED ORDER — SODIUM CHLORIDE 0.9 % IV SOLN
INTRAVENOUS | Status: DC
  Administered 2018-06-20: 13:00:00 via INTRAVENOUS

## 2018-06-20 MED ORDER — DEXAMETHASONE SODIUM PHOSPHATE 4 MG/ML IJ SOLN
4.0000 mg | Freq: Four times a day (QID) | INTRAMUSCULAR | Status: DC
  Administered 2018-06-21 – 2018-06-22 (×7): 4 mg via INTRAVENOUS
  Filled 2018-06-20 (×9): qty 1

## 2018-06-20 MED ORDER — ACETAMINOPHEN 10 MG/ML IV SOLN
INTRAVENOUS | Status: AC
  Filled 2018-06-20: qty 100

## 2018-06-20 MED ORDER — PHENYLEPHRINE HCL 10 MG/ML IJ SOLN
INTRAMUSCULAR | Status: DC | PRN
  Administered 2018-06-20 (×5): 100 ug via INTRAVENOUS

## 2018-06-20 MED ORDER — SIMVASTATIN 40 MG PO TABS
40.0000 mg | ORAL_TABLET | Freq: Every day | ORAL | Status: DC
  Administered 2018-06-21 – 2018-06-22 (×2): 40 mg via ORAL
  Filled 2018-06-20 (×3): qty 1

## 2018-06-20 MED ORDER — ONDANSETRON HCL 4 MG PO TABS: 4.0000 mg | ORAL_TABLET | Freq: Four times a day (QID) | ORAL | Status: DC | PRN

## 2018-06-20 MED ORDER — CEFAZOLIN SODIUM-DEXTROSE 2-4 GM/100ML-% IV SOLN
2.0000 g | Freq: Once | INTRAVENOUS | Status: AC
  Administered 2018-06-20: 2 g via INTRAVENOUS

## 2018-06-20 MED ORDER — FAMOTIDINE 20 MG PO TABS: 20.0000 mg | ORAL_TABLET | Freq: Once | ORAL | Status: DC

## 2018-06-20 MED ORDER — PANTOPRAZOLE SODIUM 40 MG PO TBEC
40.0000 mg | DELAYED_RELEASE_TABLET | Freq: Every day | ORAL | Status: DC
Start: 2018-06-20 — End: 2018-06-22
  Administered 2018-06-21 – 2018-06-22 (×2): 40 mg via ORAL
  Filled 2018-06-20 (×2): qty 1

## 2018-06-20 MED ORDER — METHYLPREDNISOLONE ACETATE 40 MG/ML IJ SUSP
INTRAMUSCULAR | Status: DC | PRN
  Administered 2018-06-20: 40 mg

## 2018-06-20 MED ORDER — ONDANSETRON HCL 4 MG/2ML IJ SOLN: 4.0000 mg | Freq: Once | INTRAMUSCULAR | Status: DC | PRN

## 2018-06-20 MED ORDER — HYDROMORPHONE HCL 1 MG/ML IJ SOLN
0.5000 mg | INTRAMUSCULAR | Status: DC | PRN
  Administered 2018-06-21: 0.5 mg via INTRAVENOUS
  Filled 2018-06-20: qty 0.5

## 2018-06-20 MED ORDER — SUCCINYLCHOLINE CHLORIDE 20 MG/ML IJ SOLN
INTRAMUSCULAR | Status: AC
  Filled 2018-06-20: qty 1

## 2018-06-20 MED ORDER — SODIUM CHLORIDE 0.9 % IV SOLN: 250.0000 mL | INTRAVENOUS | Status: DC

## 2018-06-20 MED ORDER — DEXAMETHASONE SODIUM PHOSPHATE 10 MG/ML IJ SOLN
INTRAMUSCULAR | Status: DC | PRN
  Administered 2018-06-20: 10 mg via INTRAVENOUS

## 2018-06-20 MED ORDER — SUGAMMADEX SODIUM 200 MG/2ML IV SOLN
INTRAVENOUS | Status: DC | PRN
  Administered 2018-06-20: 200 mg via INTRAVENOUS

## 2018-06-20 MED ORDER — LIDOCAINE HCL (PF) 2 % IJ SOLN
INTRAMUSCULAR | Status: AC
  Filled 2018-06-20: qty 10

## 2018-06-20 MED ORDER — LIDOCAINE HCL (CARDIAC) PF 100 MG/5ML IV SOSY
PREFILLED_SYRINGE | INTRAVENOUS | Status: DC | PRN
  Administered 2018-06-20: 50 mg via INTRAVENOUS

## 2018-06-20 MED ORDER — BUPIVACAINE-EPINEPHRINE (PF) 0.5% -1:200000 IJ SOLN
INTRAMUSCULAR | Status: DC | PRN
  Administered 2018-06-20: 5 mL

## 2018-06-20 SURGICAL SUPPLY — 58 items
BUR NEURO DRILL SOFT 3.0X3.8M (BURR) ×3 IMPLANT
CANISTER SUCT 1200ML W/VALVE (MISCELLANEOUS) ×6 IMPLANT
CHLORAPREP W/TINT 26ML (MISCELLANEOUS) ×6 IMPLANT
CNTNR SPEC 2.5X3XGRAD LEK (MISCELLANEOUS) ×1
CONT SPEC 4OZ STER OR WHT (MISCELLANEOUS) ×2
CONTAINER SPEC 2.5X3XGRAD LEK (MISCELLANEOUS) ×1 IMPLANT
COUNTER NEEDLE 20/40 LG (NEEDLE) ×3 IMPLANT
COVER LIGHT HANDLE STERIS (MISCELLANEOUS) ×6 IMPLANT
CUP MEDICINE 2OZ PLAST GRAD ST (MISCELLANEOUS) ×6 IMPLANT
DERMABOND ADVANCED (GAUZE/BANDAGES/DRESSINGS) ×2
DERMABOND ADVANCED .7 DNX12 (GAUZE/BANDAGES/DRESSINGS) ×1 IMPLANT
DRAPE C-ARM 42X72 X-RAY (DRAPES) ×6 IMPLANT
DRAPE C-ARM XRAY 36X54 (DRAPES) ×6 IMPLANT
DRAPE LAPAROTOMY 100X77 ABD (DRAPES) ×3 IMPLANT
DRAPE MICROSCOPE SPINE 48X150 (DRAPES) ×3 IMPLANT
DRAPE POUCH INSTRU U-SHP 10X18 (DRAPES) ×3 IMPLANT
DRAPE SURG 17X11 SM STRL (DRAPES) ×12 IMPLANT
ELECT CAUTERY BLADE TIP 2.5 (TIP) ×3
ELECT EZSTD 165MM 6.5IN (MISCELLANEOUS)
ELECT REM PT RETURN 9FT ADLT (ELECTROSURGICAL) ×3
ELECTRODE CAUTERY BLDE TIP 2.5 (TIP) ×1 IMPLANT
ELECTRODE EZSTD 165MM 6.5IN (MISCELLANEOUS) IMPLANT
ELECTRODE REM PT RTRN 9FT ADLT (ELECTROSURGICAL) ×1 IMPLANT
FRAME EYE SHIELD (PROTECTIVE WEAR) ×6 IMPLANT
GLOVE BIO SURGEON STRL SZ 6.5 (GLOVE) ×4 IMPLANT
GLOVE BIO SURGEONS STRL SZ 6.5 (GLOVE) ×2
GLOVE BIOGEL PI IND STRL 7.0 (GLOVE) ×1 IMPLANT
GLOVE BIOGEL PI INDICATOR 7.0 (GLOVE) ×2
GLOVE SURG SYN 8.5  E (GLOVE) ×6
GLOVE SURG SYN 8.5 E (GLOVE) ×3 IMPLANT
GOWN SRG XL LVL 3 NONREINFORCE (GOWNS) ×1 IMPLANT
GOWN STRL NON-REIN TWL XL LVL3 (GOWNS) ×2
GOWN STRL REUS W/TWL MED LVL3 (GOWN DISPOSABLE) ×3 IMPLANT
GRADUATE 1200CC STRL 31836 (MISCELLANEOUS) ×3 IMPLANT
KIT SPINAL PRONEVIEW (KITS) ×3 IMPLANT
KNIFE BAYONET SHORT DISCETOMY (MISCELLANEOUS) IMPLANT
MARKER SKIN DUAL TIP RULER LAB (MISCELLANEOUS) ×3 IMPLANT
NDL SAFETY ECLIPSE 18X1.5 (NEEDLE) ×1 IMPLANT
NEEDLE HYPO 18GX1.5 SHARP (NEEDLE) ×2
NEEDLE HYPO 22GX1.5 SAFETY (NEEDLE) ×3 IMPLANT
NS IRRIG 1000ML POUR BTL (IV SOLUTION) ×3 IMPLANT
PACK LAMINECTOMY NEURO (CUSTOM PROCEDURE TRAY) ×3 IMPLANT
PAD ARMBOARD 7.5X6 YLW CONV (MISCELLANEOUS) ×3 IMPLANT
SPOGE SURGIFLO 8M (HEMOSTASIS) ×2
SPONGE SURGIFLO 8M (HEMOSTASIS) ×1 IMPLANT
SUT DVC VLOC 3-0 CL 6 P-12 (SUTURE) ×3 IMPLANT
SUT VIC AB 0 CT1 27 (SUTURE) ×2
SUT VIC AB 0 CT1 27XCR 8 STRN (SUTURE) ×1 IMPLANT
SUT VIC AB 2-0 CT1 18 (SUTURE) ×3 IMPLANT
SYR 10ML LL (SYRINGE) ×3 IMPLANT
SYR 20CC LL (SYRINGE) ×3 IMPLANT
SYR 30ML LL (SYRINGE) ×6 IMPLANT
SYR 3ML LL SCALE MARK (SYRINGE) ×3 IMPLANT
TOWEL OR 17X26 4PK STRL BLUE (TOWEL DISPOSABLE) ×9 IMPLANT
TUBE MATRX SPINL 18MM 4CM DISP (INSTRUMENTS) ×2
TUBE METRX SPINAL 18X4 DISP (INSTRUMENTS) ×1 IMPLANT
TUBING CONNECTING 10 (TUBING) ×2 IMPLANT
TUBING CONNECTING 10' (TUBING) ×1

## 2018-06-20 NOTE — Transfer of Care (Signed)
Immediate Anesthesia Transfer of Care Note  Patient: Trevor [name]  Procedure(s) Performed: LUMBAR LAMINECTOMY/DECOMPRESSION MICRODISCECTOMY 1 LEVEL-L-2-3 (N/A Back)  Patient Location: PACU  Anesthesia Type:General  Level of Consciousness: sedated  Airway & Oxygen Therapy: Patient Spontanous Breathing and Patient connected to face mask oxygen  Post-op Assessment: Report given to RN and Post -op Vital signs reviewed and stable  Post vital signs: Reviewed and stable  Last Vitals:  Vitals Value Taken Time  BP 163/76 06/20/2018  6:31 PM  Temp 36.5 C 06/20/2018  6:31 PM  Pulse 76 06/20/2018  6:34 PM  Resp 16 06/20/2018  6:34 PM  SpO2 100 % 06/20/2018  6:34 PM  Vitals shown include unvalidated device data.  Last Pain:  Vitals:   06/20/18 1831  TempSrc:   PainSc: Asleep         Complications: No apparent anesthesia complications

## 2018-06-20 NOTE — Progress Notes (Signed)
  History: Trevor [name] is here for L2/3 lumbar microdiscectomy for lumbar radiculopathy. Tolerated procedure well. Seen in recovery following surgery and still disoriented from anesthesia. Able to move all extremities. Denies any pain.   Physical Exam: Vitals:   06/20/18 1244  BP: (!) 152/75  Pulse: 70  Resp: 20  Temp: 98.5 F (36.9 C)  SpO2: 100%    AA Ox3 CNI Strength: unable to accurately assess.  Sensation: unable to accurately assess.   Data:  Recent Labs  Lab 06/18/18 1415  NA 136  K 4.3  CL 101  CO2 28  BUN 22  CREATININE 0.89  GLUCOSE 98  CALCIUM 9.4   No results for input(s): AST, ALT, ALKPHOS in the last 168 hours.  Invalid input(s): TBILI   Recent Labs  Lab 06/18/18 1415  WBC 7.3  HGB 13.1  HCT 37.9*  PLT 149*   Recent Labs  Lab 06/18/18 1415  APTT 29  INR 1.11         Other tests/results: No imaging reviewed.   Assessment/Plan:  Trevor [name] is POD0 s/p L2-3 lumbar decompression/microdiscectomy for lumbar radiculopathy. Able to move all extremities post operatively and denies pain. Reassess when disorientation from anesthesia has resolved.  Continue pain control with tylenol, robaxin, and oxycodone PRN.   - mobilize - pain control - DVT prophylaxis - PTOT  Marin Olp PA-C Department of Neurosurgery

## 2018-06-20 NOTE — Anesthesia Preprocedure Evaluation (Signed)
Anesthesia Evaluation  Patient identified by MRN, date of birth, ID band Patient awake    Reviewed: Allergy & Precautions, NPO status , Patient's Chart, lab work & pertinent test results  History of Anesthesia Complications Negative for: history of anesthetic complications  Airway Mallampati: II       Dental   Pulmonary sleep apnea (not using CPAP) , neg COPD,           Cardiovascular hypertension, Pt. on medications (-) Past MI and (-) CHF (-) dysrhythmias (-) Valvular Problems/Murmurs     Neuro/Psych neg Seizures Anxiety Depression    GI/Hepatic Neg liver ROS, GERD  Medicated and Controlled,  Endo/Other  neg diabetes  Renal/GU negative Renal ROS     Musculoskeletal   Abdominal   Peds  Hematology  (+) anemia ,   Anesthesia Other Findings   Reproductive/Obstetrics                             Anesthesia Physical Anesthesia Plan  ASA: III  Anesthesia Plan: General   Post-op Pain Management:    Induction: Intravenous  PONV Risk Score and Plan: 2 and Dexamethasone and Ondansetron  Airway Management Planned: Oral ETT  Additional Equipment:   Intra-op Plan:   Post-operative Plan:   Informed Consent: I have reviewed the patients History and Physical, chart, labs and discussed the procedure including the risks, benefits and alternatives for the proposed anesthesia with the patient or authorized representative who has indicated his/her understanding and acceptance.     Plan Discussed with:   Anesthesia Plan Comments:         Anesthesia Quick Evaluation

## 2018-06-20 NOTE — Anesthesia Post-op Follow-up Note (Signed)
Anesthesia QCDR form completed.        

## 2018-06-20 NOTE — Op Note (Signed)
Indications: Mr. Sackmann is a 72 yo male who previously underwent L3-S1 fusion and then suffered an acute L2-3 disc herniation.  He suffered worsening lumbar stenosis causing weakness and difficulty walking.  Due to severe stenosis, I recommended surgical intervention.  Findings: severe stenosis L2-3  Preoperative Diagnosis: Lumbar radiculopathy Postoperative Diagnosis: same   EBL: 100 ml IVF: 700 ml Drains: none Disposition: Extubated and Stable to PACU Complications: none  No foley catheter was placed.   Preoperative Note:   Risks of surgery discussed include: infection, bleeding, stroke, coma, death, paralysis, CSF leak, nerve/spinal cord injury, numbness, tingling, weakness, complex regional pain syndrome, recurrent stenosis and/or disc herniation, vascular injury, development of instability, neck/back pain, need for further surgery, persistent symptoms, development of deformity, and the risks of anesthesia. The patient understood these risks and agreed to proceed.  Operative Note:    The patient was then brought from the preoperative center with intravenous access established.  The patient underwent general anesthesia and endotracheal tube intubation, and was then rotated on the [place] rail top where all pressure points were appropriately padded.  The skin was then thoroughly cleansed.  Perioperative antibiotic prophylaxis was administered.  Sterile prep and drapes were then applied and a timeout was then observed.  C-arm was brought into the field under sterile conditions, and the L2-3 disc space identified and marked with an incision on the left 1cm lateral to midline.  Once this was complete a 2 cm incision was opened with the use of a #10 blade knife.  The metrics tubes were sequentially advanced under lateral fluoroscopy until a 18 x 40 mm Metrix tube was placed over the facet and lamina and secured to the bed.    The microscope was then sterilely brought into the field and muscle  creep was hemostased with a bipolar and resected with a pituitary rongeur.  A Bovie extender was then used to expose the spinous process and lamina.  Careful attention was placed to not violate the facet capsule. A 3 mm matchstick drill bit was then used to make a hemi-laminotomy trough until the ligamentum flavum was exposed.  This was extended to the base of the spinous process.  Once this was complete and the underlying ligamentum flavum was visualized this was dissected with an up angle curette and resected with a #2 and #3 mm biting Kerrison.  Extensive ligamentous overgrowth was noted.  The laminotomy opening was also expanded in similar fashion and hemostasis was obtained with Surgifoam and a patty as well as bone wax.  The rostral aspect of the caudal level of the lamina was also resected with a #2 biting Kerrison effort to further enhance exposure.  Once the underlying dura was visualized a Penfield 4 was then used to dissect and expose the traversing nerve root.  Once this was identified a nerve root retractor suction was used to mobilize this medially.  The venous plexus was hemostased with Surgifoam and light bipolar use.  An annulotomy was encountered and developed with a balltip probe, and disc space contents were noted to come through the annulus.    The disc herniation was identified and dissected free using a balltip probe. The pituitary rongeur was used to remove the extruded disc fragments. Once the thecal sac and nerve root were noted to be relaxed and under less tension the ball-tipped feeler was passed along the foramen distally to to ensure no residual compression was noted.    A Depo-Medrol soaked Gelfoam pledget was placed along the  nerve root for 2 minutes and removed.  The area was irrigated. The tube system was then removed under microscopic visualization and hemostasis was obtained with a bipolar.    The fascial layer was reapproximated with the use of a 0- Vicryl suture.   Subcutaneous tissue layer was reapproximated using 2-0 Vicryl suture.  3-0 monocryl was used on the skin. The skin was then cleansed and Dermabond was used to close the skin opening.  Patient was then rotated back to the preoperative bed awakened from anesthesia and taken to recovery all counts are correct in this case.   I performed the entire procedure with the assistance of Marin Olp PA as an Pensions consultant.  Meade Maw MD

## 2018-06-20 NOTE — H&P (Signed)
  I have reviewed and confirmed my history and physical from 06/13/2018 with no additions or changes. Plan for L2-3 discectomy.  Risks and benefits reviewed.    Heart sounds normal no MRG. Chest Clear to Auscultation Bilaterally.

## 2018-06-20 NOTE — Anesthesia Procedure Notes (Signed)
Procedure Name: Intubation Performed by: Rolla Plate, CRNA Pre-anesthesia Checklist: Patient identified, Patient being monitored, Timeout performed, Emergency Drugs available and Suction available Patient Re-evaluated:Patient Re-evaluated prior to induction Oxygen Delivery Method: Circle system utilized Preoxygenation: Pre-oxygenation with 100% oxygen Induction Type: IV induction Ventilation: Mask ventilation without difficulty Laryngoscope Size: 4 and McGraph Grade View: Grade I Tube type: Oral Tube size: 7.5 mm Number of attempts: 1 Airway Equipment and Method: Stylet and Video-laryngoscopy Placement Confirmation: ETT inserted through vocal cords under direct vision,  positive ETCO2 and breath sounds checked- equal and bilateral Secured at: 22 cm Tube secured with: Tape Dental Injury: Teeth and Oropharynx as per pre-operative assessment

## 2018-06-21 ENCOUNTER — Telehealth: Payer: Self-pay | Admitting: Urology

## 2018-06-21 ENCOUNTER — Encounter: Payer: Self-pay | Admitting: Neurosurgery

## 2018-06-21 DIAGNOSIS — M5116 Intervertebral disc disorders with radiculopathy, lumbar region: Secondary | ICD-10-CM | POA: Diagnosis not present

## 2018-06-21 MED ORDER — TRAZODONE HCL 100 MG PO TABS
100.0000 mg | ORAL_TABLET | Freq: Every day | ORAL | Status: DC
  Administered 2018-06-21: 100 mg via ORAL
  Filled 2018-06-21: qty 1

## 2018-06-21 MED ORDER — SENNOSIDES-DOCUSATE SODIUM 8.6-50 MG PO TABS
1.0000 | ORAL_TABLET | Freq: Two times a day (BID) | ORAL | Status: DC | PRN
  Administered 2018-06-21 – 2018-06-22 (×3): 1 via ORAL
  Filled 2018-06-21 (×3): qty 1

## 2018-06-21 NOTE — Evaluation (Signed)
Physical Therapy Evaluation Patient Details Name: YANNIS BROCE MRN: [id number] DOB: Aug 25, 1946 Today's Date: 06/21/2018   History of Present Illness  Patient is a 72 year old male admitted s/p L2/3 lumbar microdiscectomy.  PMH includes cervical DJD, CA, BPH, Htn and anxiety.  Clinical Impression  Pt is a 72 year old male who lives in a one story home alone.  He is independent without use of AD at baseline.  Pt is experiencing loss of quadriceps and hip flexor muscle function following L2/3 microdiscectomy and requires mod A to sit at EOB.  Pt presents with poor sitting posture and balance when sitting at EOB.  He is able to initiate knee flexion and hip extension during MMT but only trace quadriceps contraction.  Pt attempted STS with +2 assist and was only able to achieve partial STS before being redirected back to bedside.  PT and PT tech performed total A squat pivot transfer from bed to chair.  Pt required several VC's for body mechanics to perform optimal transfer.  Pt is able to use UE's for assistance with transfers.  PT reviewed spinal precautions and pt was able to repeat information back to PT.  Pt is willing to work with therapy and presents with motivation to return to PLOF.  He will benefit from skilled PT in the inpatient rehabilitation setting following DC to focus on improvement of muscle strength, safe functional mobility, bed mobility and proper use of appropriate AD.  Follow Up Recommendations CIR    Equipment Recommendations  None recommended by PT(TBD at next venue of care.)    Recommendations for Other Services       Precautions / Restrictions Precautions Precautions: Fall(No bending/lifting/twisting) Restrictions Weight Bearing Restrictions: No      Mobility  Bed Mobility Overal bed mobility: Needs Assistance Bed Mobility: Supine to Sit     Supine to sit: Mod assist     General bed mobility comments: Assistance to initiate LE's over EOB.  Pt able to sit up  without assistance.  Transfers Overall transfer level: Needs assistance   Transfers: Stand Pivot Transfers;Sit to/from Stand Sit to Stand: Max assist Stand pivot transfers: Total assist       General transfer comment: Attempted STS but was unable to achieve upright posture.  PT and PT tech provided total A transfer from bed to chair with arm of chair lowered.  Ambulation/Gait Ambulation/Gait assistance: (Unable to perform.)              Stairs            Wheelchair Mobility    Modified Rankin (Stroke Patients Only)       Balance Overall balance assessment: Needs assistance Sitting-balance support: Bilateral upper extremity supported;Feet supported Sitting balance-Leahy Scale: Poor         Standing balance comment: Unable to perform standing balance without +2 max assist                             Pertinent Vitals/Pain Pain Assessment: No/denies pain    Home Living Family/patient expects to be discharged to:: Skilled nursing facility Living Arrangements: Alone                    Prior Function Level of Independence: Independent with assistive device(s)               Hand Dominance        Extremity/Trunk Assessment   Upper Extremity  Assessment Upper Extremity Assessment: Overall WFL for tasks assessed    Lower Extremity Assessment Lower Extremity Assessment: Generalized weakness(Able to perform knee flexion and hip ext: Grossly 4-/5 bilaterally.  Unable to initiate hip flexion or knee extension against gravity.  Trace muslce contraction noted in R quadriceps.)    Cervical / Trunk Assessment Cervical / Trunk Assessment: Kyphotic(Slightly kyphotic.)  Communication      Cognition Arousal/Alertness: Awake/alert Behavior During Therapy: WFL for tasks assessed/performed Overall Cognitive Status: Within Functional Limits for tasks assessed                                 General Comments: Follows commands  consistently.      General Comments      Exercises     Assessment/Plan    PT Assessment Patient needs continued PT services  PT Problem List Decreased strength;Decreased mobility       PT Treatment Interventions DME instruction;Therapeutic activities;Gait training;Therapeutic exercise;Patient/family education;Stair training;Balance training;Functional mobility training;Neuromuscular re-education    PT Goals (Current goals can be found in the Care Plan section)  Acute Rehab PT Goals Patient Stated Goal: To return to normal level of function and begin a regular exercise program. PT Goal Formulation: With patient Time For Goal Achievement: 07/12/18 Potential to Achieve Goals: Fair    Frequency BID   Barriers to discharge        Co-evaluation               AM-PAC PT "6 Clicks" Daily Activity  Outcome Measure Difficulty turning over in bed (including adjusting bedclothes, sheets and blankets)?: A Lot Difficulty moving from lying on back to sitting on the side of the bed? : A Lot Difficulty sitting down on and standing up from a chair with arms (e.g., wheelchair, bedside commode, etc,.)?: Unable Help needed moving to and from a bed to chair (including a wheelchair)?: Total Help needed walking in hospital room?: Total Help needed climbing 3-5 steps with a railing? : Total 6 Click Score: 8    End of Session Equipment Utilized During Treatment: Gait belt Activity Tolerance: Patient tolerated treatment well Patient left: in chair;with chair alarm set;with call bell/phone within reach Nurse Communication: Mobility status PT Visit Diagnosis: Muscle weakness (generalized) (M62.81)    Time: 1100-1130 PT Time Calculation (min) (ACUTE ONLY): 30 min   Charges:   PT Evaluation $PT Eval Moderate Complexity: 1 Mod PT Treatments $Therapeutic Activity: 8-22 mins   PT G Codes:   PT G-Codes **NOT FOR INPATIENT CLASS** Functional Assessment Tool Used: AM-PAC 6 Clicks Basic  Mobility   Roxanne Gates, PT, DPT   Roxanne Gates 06/21/2018, 2:17 PM

## 2018-06-21 NOTE — Progress Notes (Signed)
Physical Therapy Treatment Patient Details Name: Trevor [name] MRN: [id number] DOB: February 05, 1946 Today's Date: 06/21/2018    History of Present Illness Patient is a 72 year old male admitted s/p L2/3 lumbar microdiscectomy.  PMH includes cervical DJD, CA, BPH, Htn and anxiety.    PT Comments    Pt was able to demonstrate progress by better participating in transfer from [facility] to bed with VC's.  Pt required max +2 assist and was able to support minimally with LE's.  He still presents with poor sitting posture and requires use of bilateral UE's for support.  He required assistance with LE's when getting back into bed and was able to use UE's to assist in positioning.  PT introduced supine there ex and pt required manual assistance for all exercises except ankle pumps.  He is able to demonstrate minimal quadriceps contraction with manual cues during quad sets.  Pt reported no pain throughout treatment and PT assisted pt in positioning for prevention of pressure ulcers.  He is receptive to education and asks pertinent questions regarding management of HEP.  Pt will continue to benefit from skilled PT with focus on strength, functional mobility, safe transfers and balance.  Follow Up Recommendations  CIR     Equipment Recommendations  None recommended by PT(TBD at next venue of care.)    Recommendations for Other Services       Precautions / Restrictions Precautions Precautions: Fall Precaution Comments: spinal precautions: no bending, lifting, twisting, arching Restrictions Weight Bearing Restrictions: No    Mobility  Bed Mobility Overal bed mobility: Needs Assistance Bed Mobility: Sit to Supine     Supine to sit: Mod assist Sit to supine: Max assist   General bed mobility comments: Assisted pt with LE's over EOB and scooting up in bed.  Pt able to use UE's to assist.  Transfers Overall transfer level: Needs assistance   Transfers: Stand Pivot Transfers;Sit to/from Stand Sit to  Stand: Max assist Stand pivot transfers: Total assist       General transfer comment: Stand pivot from [facility] +2 with VC's for pt to push down through LE's and use UE's for support.  Pt able to help minimally.  Ambulation/Gait Ambulation/Gait assistance: (Unable to perform.)               Stairs             Wheelchair Mobility    Modified Rankin (Stroke Patients Only)       Balance Overall balance assessment: Needs assistance Sitting-balance support: Bilateral upper extremity supported;Feet supported Sitting balance-Leahy Scale: Poor         Standing balance comment: Unable to perform standing balance without +2 max assist                            Cognition Arousal/Alertness: Awake/alert Behavior During Therapy: WFL for tasks assessed/performed Overall Cognitive Status: Within Functional Limits for tasks assessed                                 General Comments: Follows commands consistently.      Exercises General Exercises - Lower Extremity Ankle Circles/Pumps: 20 reps;Supine;Both Quad Sets: Strengthening;Both;10 reps;Supine(with manual cues for initiation of quadriceps contraction.  Minimal contraction noted in R>L side.) Heel Slides: AAROM;Both;Supine Hip ABduction/ADduction: AAROM;Both;10 reps;Supine Other Exercises Other Exercises: Educated concerning managment of HEP and progression of exercises x5 min  Other Exercises: Introduced supine transverse abdominis contraction and pr completed 10x3 sec    General Comments        Pertinent Vitals/Pain Pain Assessment: No/denies pain    Home Living Family/patient expects to be discharged to:: Inpatient rehab Living Arrangements: Alone                  Prior Function Level of Independence: Independent with assistive device(s)          PT Goals (current goals can now be found in the care plan section) Acute Rehab PT Goals Patient Stated Goal: To return to normal  level of function and begin a regular exercise program. PT Goal Formulation: With patient Time For Goal Achievement: 07/12/18 Potential to Achieve Goals: Fair Progress towards PT goals: Progressing toward goals    Frequency    BID      PT Plan Current plan remains appropriate    Co-evaluation              AM-PAC PT "6 Clicks" Daily Activity  Outcome Measure  Difficulty turning over in bed (including adjusting bedclothes, sheets and blankets)?: A Lot Difficulty moving from lying on back to sitting on the side of the bed? : A Lot Difficulty sitting down on and standing up from a chair with arms (e.g., wheelchair, bedside commode, etc,.)?: Unable Help needed moving to and from a bed to chair (including a wheelchair)?: Total Help needed walking in hospital room?: Total Help needed climbing 3-5 steps with a railing? : Total 6 Click Score: 8    End of Session Equipment Utilized During Treatment: Gait belt Activity Tolerance: Patient tolerated treatment well Patient left: in bed;with bed alarm set;with nursing/sitter in room Nurse Communication: Mobility status PT Visit Diagnosis: Muscle weakness (generalized) (M62.81)     Time: 9758-8325 PT Time Calculation (min) (ACUTE ONLY): 32 min  Charges:  $Therapeutic Exercise: 8-22 mins $Therapeutic Activity: 8-22 mins                    G Codes:  Functional Assessment Tool Used: AM-PAC 6 Clicks Basic Mobility    Roxanne Gates, PT, DPT    Roxanne Gates 06/21/2018, 4:48 PM

## 2018-06-21 NOTE — Progress Notes (Signed)
Patient requesting to take trazodone at bedtime like he does at home.  Dr Cari Caraway ordered trazodone

## 2018-06-21 NOTE — Clinical Social Work Note (Signed)
Clinical Social Work Assessment  Patient Details  Name: Trevor [name] MRN: [id number] Date of Birth: 12-08-1946  Date of referral:  06/21/18               Reason for consult:  Discharge Planning                Permission sought to share information with:    Permission granted to share information::     Name::        Agency::     Relationship::     Contact Information:     Housing/Transportation Living arrangements for the past 2 months:  Single Family Home Source of Information:  Patient Patient Interpreter Needed:  None Criminal Activity/Legal Involvement Pertinent to Current Situation/Hospitalization:  No - Comment as needed Significant Relationships:  Siblings Lives with:  Self Do you feel safe going back to the place where you live?  Yes Need for family participation in patient care:  Yes (Comment)  Care giving concerns:  Patient resides at home alone. Has been temporarily staying with his brother for past few days.   Social Worker assessment / plan:  CSW asked to see patient because patient requested this morning to go to rehab and preferred [facility]. PT worked with patient this afternoon and was verbally saying that patient was short term rehab appropriate but after speaking with her supervisor, decided to recommend CIR ([facility]). CSW was only aware of the short term rehab referral when going to speak with patient. CSW explained to the patient about short term rehab and he was in agreement. CSW also extended bed offers and [facility] could not offer but patient chose [facility]. CSW allowed to process the series of falls he has endured over the past week. Once CSW left patient's room, the RN CM informed CSW that PT was now changing their recommendation to CIR. CSW went back in to explain this to the patient and that CSW will hold on to the bed at [facility] in the event CIR was unable to take him. Patient verbalized understanding.  Employment status:    Designer, industrial/product PT Recommendations:  (Initially PT verbalized short term rehab however after PT spoke with her supervisor, recommentation made for [facility]) Information / Referral to community resources:     Patient/Family's Response to care:  Patient expressed appreciation for CSW assistance.  Patient/Family's Understanding of and Emotional Response to Diagnosis, Current Treatment, and Prognosis:  Patient has been frustrated regarding his frequent falls due to his leg giving out. Patient is hopeful that his medical work up will find out the reason for this.   Emotional Assessment Appearance:  Appears stated age Attitude/Demeanor/Rapport:  (pleasant and cooperative) Affect (typically observed):    Orientation:  Oriented to Self, Oriented to Place, Oriented to  Time, Oriented to Situation Alcohol / Substance use:  Not Applicable Psych involvement (Current and /or in the community):  No (Comment)  Discharge Needs  Concerns to be addressed:  Care Coordination Readmission within the last 30 days:  No Current discharge risk:  None Barriers to Discharge:  No Barriers Identified   Shela Leff, LCSW 06/21/2018, 2:11 PM

## 2018-06-21 NOTE — Care Management Obs Status (Signed)
[facility] NOTIFICATION   Patient Details  Name: Trevor [name] MRN: [id number] Date of Birth: Mar 06, 1946   Medicare Observation Status Notification Given:  Yes    DWAYN, MORAVEK, RN 06/21/2018, 12:00 PM

## 2018-06-21 NOTE — Progress Notes (Signed)
I returned to see Trevor [name].  He continues to have decreased iliopsoas and quadriceps strength, with preserved hamstring and distal.    His lower extremity and perineal sensation are intact.    His exam looks most consistent with traction response related to surgery.  I have recommended steroids and rehabilitation.  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

## 2018-06-21 NOTE — Progress Notes (Signed)
Inpatient Rehabilitation  Per PT request, patient was screened by Gunnar Fusi for appropriateness for an Inpatient Acute Rehab consult.  At this time we are recommending an Inpatient Rehab consult and will place order.  Discussed case with nurse case manager and that patient is in favor of CIR in order to maximize his functional independence prior to returning home.  Plan to initiate insurance authorization with [facility] Medicare in hopes of an admission tomorrow.  Call if questions.   Carmelia Roller., CCC/SLP Admission Coordinator  [place]  Cell [phone]

## 2018-06-21 NOTE — Evaluation (Signed)
Occupational Therapy Evaluation Patient Details Name: Trevor [name] MRN: [id number] DOB: [birth year] Today's Date: 06/21/2018    History of Present Illness Patient is a 72 year old male admitted s/p L2/3 lumbar microdiscectomy.  PMH includes cervical DJD, CA, BPH, Htn and anxiety.   Clinical Impression   Pt seen for OT evaluation this date, POD#1 from L 2/3 microdiscectomy due to lumbar radiculopathy. Prior to hospital admission, pt was independent with mobility with quad cane, independent with ADLs and IADLs, he was working and driving. Pt started experiencing increased falls (4 in 2 weeks prior to admission) with numbness in legs leading to buckling knees. Pt lives alone in a single story home with 1 step to enter and railing on both sides. Pt has two brothers that live nearby, one of which, he stayed with after initial fall. Currently pt requires MAX A with bed mobility and LB ADLs.  Pt educated in spinal precautions, role of OT, potential modifications, and safety precautions. Pt verbalized understanding of all education/training provided. Pt with severe change from baseline in LE strength impacting ability to safely and independently perform dressing, bathing, toileting tasks as well as functional ADL mobility and transfers. Based on pt's current levels relative to high PLOF, continued OT in acute setting is necessary as well as follow up upon d/c from acute setting. Most appropriate recommendation is inpatient acute rehab setting (CIR) to ensure most positive outcomes as close to pt baseline as possible.      Follow Up Recommendations  CIR    Equipment Recommendations  Other (comment)(defer to next level of care. )    Recommendations for Other Services       Precautions / Restrictions Precautions Precautions: Fall Precaution Comments: spinal precautions: no bending, lifting, twisting Restrictions Weight Bearing Restrictions: No      Mobility Bed Mobility Overal bed mobility: Needs  Assistance Bed Mobility: Supine to Sit     Supine to sit: Mod assist     General bed mobility comments: sup to sit not completed on OT Eval, but on clinical observation, pt requires MOD A to manage LEs to EOB.   Transfers                      Balance Overall balance assessment: Needs assistance Sitting-balance support: Bilateral upper extremity supported;Feet supported Sitting balance-Leahy Scale: Poor         Standing balance comment: Unable to perform standing balance without +2 max assist                           ADL either performed or assessed with clinical judgement   ADL Overall ADL's : Needs assistance/impaired Eating/Feeding: Independent   Grooming: Set up   Upper Body Bathing: Set up   Lower Body Bathing: Moderate assistance   Upper Body Dressing : Minimal assistance   Lower Body Dressing: Maximal assistance                       Vision Baseline Vision/History: Wears glasses Wears Glasses: At all times Patient Visual Report: No change from baseline       Perception     Praxis      Pertinent Vitals/Pain Pain Assessment: No/denies pain     Hand Dominance     Extremity/Trunk Assessment Upper Extremity Assessment Upper Extremity Assessment: Overall WFL for tasks assessed   Lower Extremity Assessment Lower Extremity Assessment: RLE deficits/detail;LLE deficits/detail RLE Deficits /  Details: 3-/5 for abd/add, knee flex/ext, and hip flex/ext LLE Deficits / Details: 2+/5 for abd/add, knee flex/ext, and hip flex/ext       Communication Communication Communication: No difficulties   Cognition Arousal/Alertness: Awake/alert Behavior During Therapy: WFL for tasks assessed/performed Overall Cognitive Status: Within Functional Limits for tasks assessed                                 General Comments: A&O x3   General Comments       Exercises Other Exercises Other Exercises: Pt educated on role of  OT, OT POC, spinal precautions, and safety precautions x8 mins.    Shoulder Instructions      Home Living Family/patient expects to be discharged to:: Inpatient rehab Living Arrangements: Alone                                      Prior Functioning/Environment Level of Independence: Independent with assistive device(s)                 OT Problem List: Decreased strength;Decreased range of motion;Decreased activity tolerance;Impaired balance (sitting and/or standing);Impaired tone      OT Treatment/Interventions: Self-care/ADL training;Therapeutic exercise;Neuromuscular education;Energy conservation;DME and/or AE instruction;Therapeutic activities;Patient/family education;Balance training    OT Goals(Current goals can be found in the care plan section) Acute Rehab OT Goals Patient Stated Goal: To return to PLOF.  OT Goal Formulation: With patient Time For Goal Achievement: 07/05/18 Potential to Achieve Goals: Good ADL Goals Additional ADL Goal #1: Pt will perform sup to sit t/f with MIN A with HOB elevated, modified technique-leg lifter PRN and use of bedrail Additional ADL Goal #2: Pt will tolerate EOB sitting x5 mins with use of UEs for stability alternating to complete grooming task with MIN-CGA.  OT Frequency: Min 1X/week   Barriers to D/C:            Co-evaluation              AM-PAC PT "6 Clicks" Daily Activity     Outcome Measure Help from another person eating meals?: None Help from another person taking care of personal grooming?: A Little Help from another person toileting, which includes using toliet, bedpan, or urinal?: A Lot Help from another person bathing (including washing, rinsing, drying)?: A Lot Help from another person to put on and taking off regular upper body clothing?: A Little Help from another person to put on and taking off regular lower body clothing?: A Lot 6 Click Score: 16   End of Session Nurse Communication:  Mobility status;Other (comment)  Activity Tolerance: Patient tolerated treatment well Patient left:    OT Visit Diagnosis: Other abnormalities of gait and mobility (R26.89);Muscle weakness (generalized) (M62.81)                Time: 5462-7035 OT Time Calculation (min): 16 min Charges:  OT General Charges $OT Visit: 1 Visit OT Evaluation $OT Eval Moderate Complexity: 1 Mod OT Treatments $Self Care/Home Management : 8-22 mins Gerrianne Scale, MS, OTR/L ascom [phone] or [phone] 06/21/18, 4:11 PM

## 2018-06-21 NOTE — Care Management (Signed)
Patient admitted status post Lumbar radiculopathy.  Up until 2 weeks ago patient was living at home alone, and still working.  For the last 2 weeks, due to his condition he has been staying with his brother.  PCP [place].  Patient has RW in the home.  PT has assessed the patient and recommending rehab.  Patient wishes to pursue SNF, and his preference is [facility].  CSW updated

## 2018-06-21 NOTE — Progress Notes (Signed)
CNA and I attempted to get the patient up to the chair but he was unable to bear weight on his legs and could not stand.  Patient put back in bed

## 2018-06-21 NOTE — Care Management (Signed)
PT has assessed patient and recommended CIR.  Patient is wanting to pursue.  [facility] admission Coordinator is aware.  OT has seen the patient and recommends CIR as well.  Note pending. After documentation is complete, Melissa to start insurance authorization.

## 2018-06-21 NOTE — Progress Notes (Addendum)
  History: Trevor [name] is POD1 s/p  L2/3 lumbar microdiscectomy for lumbar radiculopathy. Complains of worsening weakness in lower extremities than before surgery. Prior to surgery he was able to get around with a walker, but when hospital staff attempted to ambulate patient, he was unable to bear weight. Also complained of episode of severe pain in left thigh last night, but this has since resolved without recurrence.  Denies any other pain, numbness/tingling.  Last night [name] was consulted on patient's inability to urinate. Urology was consulted and catheter was placed.  Currently on      Physical Exam: Vitals:   06/21/18 0004 06/21/18 0525  BP: (!) 142/73 (!) 146/87  Pulse: 63 72  Resp: 16 18  Temp: 97.8 F (36.6 C) 98 F (36.7 C)  SpO2: 97% 95%    AA Ox3 CNI Skin: incision site intact. Glue present and intact. No drainage.  Strength:  LLE: 3/5 illiopsoas, 2/5 quad. Otherwise 5/5 RLE: 4+/5 iliopsoas, 2/5 quad, 4+ dorsiflexion. Otherwise 5/5 Sensation: intact and symmetric  Data:  Recent Labs  Lab 06/18/18 1415  NA 136  K 4.3  CL 101  CO2 28  BUN 22  CREATININE 0.89  GLUCOSE 98  CALCIUM 9.4   No results for input(s): AST, ALT, ALKPHOS in the last 168 hours.  Invalid input(s): TBILI   Recent Labs  Lab 06/18/18 1415  WBC 7.3  HGB 13.1  HCT 37.9*  PLT 149*   Recent Labs  Lab 06/18/18 1415  APTT 29  INR 1.11         Other tests/results: No imaging reviewed.   Assessment/Plan:  Trevor [name] is POD1 s/p L2-3 lumbar decompression/microdiscectomy for lumbar radiculopathy with complaints of worsening weakness. Will await PT and OT evaluation.  Home appointment for catheter removal on 06/25/2018.   - mobilize - pain control - DVT prophylaxis - PTOT  Marin Olp PA-C Department of Neurosurgery

## 2018-06-21 NOTE — Progress Notes (Signed)
Patient c/o severe bladder discomfort; unable to cath patient passed 2cm of red rubber catheter tubing after 2 RN attempts; [name] notified and stat Urology consult called to Dr. Bernardo Heater; acknowledged; Dr. Dene Gentry phone number given to [name] for Provider to Provider contact;  Urology cart called for from service response and the [facility], [place], [place]; Barbaraann Faster, RN 2:03 AM 06/21/2018

## 2018-06-21 NOTE — Telephone Encounter (Signed)
Appt made, nurse notified on 2C of appt date/time. Thanks Angie

## 2018-06-21 NOTE — Consult Note (Signed)
Urology Consult Note  Trevor [name] is a 72 year old male seen at the request of Dr. Cari Caraway for Foley catheter placement.  He is status post L2/3 microdiscectomy today and has been unable to urinate postoperatively.  Bladder scan was greater than 600 mL.  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  Nursing staff stated catheter would not advance beyond 2 cm of the urethral meatus.   Exam: Penis is without lesions.  There is a distal shaft hypospadias present.  Testes descended bilaterally without masses or tenderness.   The external genitalia were prepped and draped.  A 16 French Foley catheter was placed without difficulty through the hypospadiac meatus.  Clear, yellow urine was obtained.   Impression: Postoperative urinary retention  Recommendation: Would leave his catheter indwelling until early next week.  Will schedule follow-up cath removal/voiding trial on 06/25/2018

## 2018-06-21 NOTE — Progress Notes (Signed)
LLE movement slow in recovery, improving slightly overnight; foley output large with much comfort; denied back pain, only LLE pain; Barbaraann Faster, RN 7:32 AM 06/21/2018

## 2018-06-21 NOTE — Telephone Encounter (Signed)
-----   Message from Abbie Sons, MD sent at 06/21/2018  3:14 AM EDT ----- Please schedule nurse visit with catheter removal/voiding trial on 7/15.  He is currently an inpatient on 2C

## 2018-06-21 NOTE — NC FL2 (Signed)
[facility] LEVEL OF CARE SCREENING TOOL     IDENTIFICATION  Patient Name: Trevor [name] Birthdate: 1946-11-11 Sex: male Admission Date (Current Location): 06/20/2018  [place] and [place] Number:  Engineering geologist and Address:  [facility], [address], [place], [place] [address]      Provider Number: [id number]  Attending Physician Name and Address:  Meade Maw, MD  Relative Name and Phone Number:       Current Level of Care: Hospital Recommended Level of Care: [place] Prior Approval Number:    Date Approved/Denied:   PASRR Number:    Discharge Plan: SNF    Current Diagnoses: Patient Active Problem List   Diagnosis Date Noted  . Lumbar radiculopathy 06/20/2018  . Nocturia 10/23/2015  . Absolute anemia 05/13/2014  . Benign essential HTN 05/13/2014  . Blood glucose elevated 05/13/2014  . LBP (low back pain) 05/13/2014  . HLD (hyperlipidemia) 05/13/2014  . Malignant neoplasm of lateral wall of urinary bladder ([place]) 09/19/2013  . Benign prostatic hypertrophy without urinary obstruction 12/10/2012  . Hypospadias 12/10/2012  . Microscopic hematuria 12/10/2012  . History of neoplasm of bladder 12/10/2012  . FOM (frequency of micturition) 12/10/2012    Orientation RESPIRATION BLADDER Height & Weight     Self, Time, Situation, Place  Normal Continent Weight: 180 lb (81.6 kg) Height:  5\' 8"  (172.7 cm)  BEHAVIORAL SYMPTOMS/MOOD NEUROLOGICAL BOWEL NUTRITION STATUS  (none)   Continent Diet(soft)  AMBULATORY STATUS COMMUNICATION OF NEEDS Skin   Total Care Verbally Normal                       Personal Care Assistance Level of Assistance  Bathing, Dressing, Feeding Bathing Assistance: Maximum assistance Feeding assistance: Maximum assistance Dressing Assistance: Maximum assistance     Functional Limitations Info  Sight Sight Info: Impaired        SPECIAL CARE FACTORS FREQUENCY  PT (By  licensed PT)                    Contractures Contractures Info: Not present    Additional Factors Info  Code Status Code Status Info: full             Current Medications (06/21/2018):  This is the current hospital active medication list Current Facility-Administered Medications  Medication Dose Route Frequency Provider Last Rate Last Dose  . acetaminophen (TYLENOL) tablet 650 mg  650 mg Oral Q4H PRN Marin Olp, PA-C       Or  . acetaminophen (TYLENOL) suppository 650 mg  650 mg Rectal Q4H PRN Marin Olp, PA-C      . acetaminophen (TYLENOL) tablet 1,000 mg  1,000 mg Oral Q6H Marin Olp, PA-C   1,000 mg at 06/21/18 1152  . celecoxib (CELEBREX) capsule 200 mg  200 mg Oral BID Marin Olp, PA-C   200 mg at 06/21/18 1008  . dexamethasone (DECADRON) injection 4 mg  4 mg Intravenous Q6H Marin Olp, PA-C   4 mg at 06/21/18 1152  . famotidine (PEPCID) tablet 20 mg  20 mg Oral Once Gunnar Fusi, MD      . lisinopril (PRINIVIL,ZESTRIL) tablet 10 mg  10 mg Oral Daily Marin Olp, PA-C   10 mg at 06/21/18 1013   And  . hydrochlorothiazide (MICROZIDE) capsule 12.5 mg  12.5 mg Oral Daily Marin Olp, PA-C   12.5 mg at 06/21/18 1013  . HYDROmorphone (DILAUDID) injection 0.5 mg  0.5 mg  Intravenous Q4H PRN Marin Olp, PA-C   0.5 mg at 06/21/18 0123  . menthol-cetylpyridinium (CEPACOL) lozenge 3 mg  1 lozenge Oral PRN Marin Olp, PA-C       Or  . phenol (CHLORASEPTIC) mouth spray 1 spray  1 spray Mouth/Throat PRN Marin Olp, PA-C      . methocarbamol (ROBAXIN) tablet 500 mg  500 mg Oral Q6H PRN Marin Olp, PA-C       Or  . methocarbamol (ROBAXIN) 500 mg in dextrose 5 % 50 mL IVPB  500 mg Intravenous Q6H PRN Marin Olp, PA-C      . ondansetron [facility]) tablet 4 mg  4 mg Oral Q6H PRN Marin Olp, PA-C       Or  . ondansetron [facility]) injection 4 mg  4 mg Intravenous Q6H PRN Marin Olp, PA-C      . oxyCODONE (Oxy IR/ROXICODONE) immediate release  tablet 10 mg  10 mg Oral Q3H PRN Marin Olp, PA-C      . oxyCODONE (Oxy IR/ROXICODONE) immediate release tablet 5 mg  5 mg Oral Q3H PRN Marin Olp, PA-C   5 mg at 06/21/18 1008  . pantoprazole (PROTONIX) EC tablet 40 mg  40 mg Oral Daily Marin Olp, PA-C   40 mg at 06/21/18 0825  . senna-docusate (Senokot-S) tablet 1 tablet  1 tablet Oral QHS PRN Marin Olp, PA-C      . simvastatin (ZOCOR) tablet 40 mg  40 mg Oral Daily Marin Olp, PA-C   40 mg at 06/21/18 1009  . sodium chloride flush (NS) 0.9 % injection 3 mL  3 mL Intravenous Q12H Marin Olp, PA-C   3 mL at 06/21/18 1014  . sodium chloride flush (NS) 0.9 % injection 3 mL  3 mL Intravenous PRN Marin Olp, PA-C      . tamsulosin [facility]) capsule 0.4 mg  0.4 mg Oral Daily Marin Olp, PA-C   0.4 mg at 06/21/18 1009     Discharge Medications: Please see discharge summary for a list of discharge medications.  Relevant Imaging Results:  Relevant Lab Results:   Additional Information ss: [id number]  Shela Leff, LCSW

## 2018-06-21 NOTE — Progress Notes (Signed)
Lifting LLE better at this time; unable to void; bladder scan 654 ml; condom cath on patient upon request; will continue to monitor up to 8 hours, then cath PRN; Denies bladder discomfort at this time. Barbaraann Faster, RN 12:30 AM 06/21/2018

## 2018-06-22 ENCOUNTER — Other Ambulatory Visit: Payer: Self-pay

## 2018-06-22 ENCOUNTER — Inpatient Hospital Stay (HOSPITAL_COMMUNITY)
Admission: RE | Admit: 2018-06-22 | Discharge: 2018-07-13 | DRG: 092 | Disposition: A | Discharge status: Other Acute Inpatient Hospital | Payer: Medicare Other | Source: Intra-hospital | Attending: Physical Medicine & Rehabilitation | Admitting: Physical Medicine & Rehabilitation

## 2018-06-22 ENCOUNTER — Encounter: Payer: Self-pay | Admitting: Physical Medicine and Rehabilitation

## 2018-06-22 ENCOUNTER — Encounter (HOSPITAL_COMMUNITY): Payer: Self-pay | Admitting: *Deleted

## 2018-06-22 DIAGNOSIS — T380X5A Adverse effect of glucocorticoids and synthetic analogues, initial encounter: Secondary | ICD-10-CM | POA: Diagnosis not present

## 2018-06-22 DIAGNOSIS — G4700 Insomnia, unspecified: Secondary | ICD-10-CM | POA: Diagnosis present

## 2018-06-22 DIAGNOSIS — R531 Weakness: Secondary | ICD-10-CM | POA: Diagnosis present

## 2018-06-22 DIAGNOSIS — D72829 Elevated white blood cell count, unspecified: Secondary | ICD-10-CM

## 2018-06-22 DIAGNOSIS — R509 Fever, unspecified: Secondary | ICD-10-CM

## 2018-06-22 DIAGNOSIS — B964 Proteus (mirabilis) (morganii) as the cause of diseases classified elsewhere: Secondary | ICD-10-CM | POA: Diagnosis not present

## 2018-06-22 DIAGNOSIS — D62 Acute posthemorrhagic anemia: Secondary | ICD-10-CM

## 2018-06-22 DIAGNOSIS — T888XXA Other specified complications of surgical and medical care, not elsewhere classified, initial encounter: Secondary | ICD-10-CM

## 2018-06-22 DIAGNOSIS — G479 Sleep disorder, unspecified: Secondary | ICD-10-CM

## 2018-06-22 DIAGNOSIS — I340 Nonrheumatic mitral (valve) insufficiency: Secondary | ICD-10-CM | POA: Diagnosis present

## 2018-06-22 DIAGNOSIS — J309 Allergic rhinitis, unspecified: Secondary | ICD-10-CM | POA: Diagnosis present

## 2018-06-22 DIAGNOSIS — T8189XS Other complications of procedures, not elsewhere classified, sequela: Secondary | ICD-10-CM | POA: Diagnosis not present

## 2018-06-22 DIAGNOSIS — T8131XA Disruption of external operation (surgical) wound, not elsewhere classified, initial encounter: Secondary | ICD-10-CM | POA: Diagnosis not present

## 2018-06-22 DIAGNOSIS — I10 Essential (primary) hypertension: Secondary | ICD-10-CM | POA: Diagnosis present

## 2018-06-22 DIAGNOSIS — Z7982 Long term (current) use of aspirin: Secondary | ICD-10-CM

## 2018-06-22 DIAGNOSIS — R2689 Other abnormalities of gait and mobility: Principal | ICD-10-CM | POA: Diagnosis present

## 2018-06-22 DIAGNOSIS — Z8551 Personal history of malignant neoplasm of bladder: Secondary | ICD-10-CM

## 2018-06-22 DIAGNOSIS — D696 Thrombocytopenia, unspecified: Secondary | ICD-10-CM

## 2018-06-22 DIAGNOSIS — E875 Hyperkalemia: Secondary | ICD-10-CM | POA: Diagnosis present

## 2018-06-22 DIAGNOSIS — R7989 Other specified abnormal findings of blood chemistry: Secondary | ICD-10-CM | POA: Diagnosis not present

## 2018-06-22 DIAGNOSIS — N138 Other obstructive and reflux uropathy: Secondary | ICD-10-CM | POA: Diagnosis present

## 2018-06-22 DIAGNOSIS — E785 Hyperlipidemia, unspecified: Secondary | ICD-10-CM | POA: Diagnosis present

## 2018-06-22 DIAGNOSIS — F419 Anxiety disorder, unspecified: Secondary | ICD-10-CM | POA: Diagnosis present

## 2018-06-22 DIAGNOSIS — T8131XS Disruption of external operation (surgical) wound, not elsewhere classified, sequela: Secondary | ICD-10-CM | POA: Diagnosis not present

## 2018-06-22 DIAGNOSIS — K219 Gastro-esophageal reflux disease without esophagitis: Secondary | ICD-10-CM | POA: Diagnosis present

## 2018-06-22 DIAGNOSIS — N401 Enlarged prostate with lower urinary tract symptoms: Secondary | ICD-10-CM | POA: Diagnosis present

## 2018-06-22 DIAGNOSIS — T8130XD Disruption of wound, unspecified, subsequent encounter: Secondary | ICD-10-CM | POA: Diagnosis not present

## 2018-06-22 DIAGNOSIS — T8141XA Infection following a procedure, superficial incisional surgical site, initial encounter: Secondary | ICD-10-CM | POA: Diagnosis not present

## 2018-06-22 DIAGNOSIS — T8149XA Infection following a procedure, other surgical site, initial encounter: Secondary | ICD-10-CM | POA: Diagnosis not present

## 2018-06-22 DIAGNOSIS — R338 Other retention of urine: Secondary | ICD-10-CM | POA: Diagnosis present

## 2018-06-22 DIAGNOSIS — M5116 Intervertebral disc disorders with radiculopathy, lumbar region: Secondary | ICD-10-CM | POA: Diagnosis not present

## 2018-06-22 DIAGNOSIS — R739 Hyperglycemia, unspecified: Secondary | ICD-10-CM | POA: Diagnosis present

## 2018-06-22 DIAGNOSIS — B002 Herpesviral gingivostomatitis and pharyngotonsillitis: Secondary | ICD-10-CM | POA: Diagnosis present

## 2018-06-22 DIAGNOSIS — G8918 Other acute postprocedural pain: Secondary | ICD-10-CM | POA: Diagnosis not present

## 2018-06-22 DIAGNOSIS — Z79899 Other long term (current) drug therapy: Secondary | ICD-10-CM

## 2018-06-22 DIAGNOSIS — R202 Paresthesia of skin: Secondary | ICD-10-CM | POA: Diagnosis not present

## 2018-06-22 DIAGNOSIS — R7881 Bacteremia: Secondary | ICD-10-CM | POA: Diagnosis not present

## 2018-06-22 DIAGNOSIS — G9581 Conus medullaris syndrome: Secondary | ICD-10-CM | POA: Diagnosis not present

## 2018-06-22 DIAGNOSIS — Z8349 Family history of other endocrine, nutritional and metabolic diseases: Secondary | ICD-10-CM

## 2018-06-22 DIAGNOSIS — G834 Cauda equina syndrome: Secondary | ICD-10-CM | POA: Diagnosis present

## 2018-06-22 DIAGNOSIS — R7303 Prediabetes: Secondary | ICD-10-CM

## 2018-06-22 DIAGNOSIS — Z981 Arthrodesis status: Secondary | ICD-10-CM | POA: Diagnosis not present

## 2018-06-22 DIAGNOSIS — D72823 Leukemoid reaction: Secondary | ICD-10-CM | POA: Diagnosis not present

## 2018-06-22 DIAGNOSIS — K2970 Gastritis, unspecified, without bleeding: Secondary | ICD-10-CM | POA: Diagnosis present

## 2018-06-22 DIAGNOSIS — R52 Pain, unspecified: Secondary | ICD-10-CM | POA: Diagnosis not present

## 2018-06-22 DIAGNOSIS — R21 Rash and other nonspecific skin eruption: Secondary | ICD-10-CM | POA: Diagnosis not present

## 2018-06-22 DIAGNOSIS — R339 Retention of urine, unspecified: Secondary | ICD-10-CM | POA: Diagnosis not present

## 2018-06-22 DIAGNOSIS — K592 Neurogenic bowel, not elsewhere classified: Secondary | ICD-10-CM | POA: Diagnosis present

## 2018-06-22 DIAGNOSIS — Z8739 Personal history of other diseases of the musculoskeletal system and connective tissue: Secondary | ICD-10-CM | POA: Diagnosis not present

## 2018-06-22 DIAGNOSIS — R350 Frequency of micturition: Secondary | ICD-10-CM | POA: Diagnosis not present

## 2018-06-22 DIAGNOSIS — T8149XD Infection following a procedure, other surgical site, subsequent encounter: Secondary | ICD-10-CM | POA: Diagnosis not present

## 2018-06-22 MED ORDER — TRAZODONE HCL 50 MG PO TABS
100.0000 mg | ORAL_TABLET | Freq: Every day | ORAL | Status: DC
  Administered 2018-06-22 – 2018-06-28 (×7): 100 mg via ORAL
  Filled 2018-06-22 (×7): qty 2

## 2018-06-22 MED ORDER — ACETAMINOPHEN 325 MG PO TABS
325.0000 mg | ORAL_TABLET | ORAL | Status: DC | PRN
Start: 2018-06-22 — End: 2018-07-13
  Administered 2018-06-28 – 2018-07-12 (×15): 650 mg via ORAL
  Filled 2018-06-22 (×15): qty 2

## 2018-06-22 MED ORDER — DEXAMETHASONE 4 MG PO TABS
4.0000 mg | ORAL_TABLET | Freq: Four times a day (QID) | ORAL | Status: DC
Start: 2018-06-22 — End: 2018-06-24
  Administered 2018-06-22 – 2018-06-24 (×6): 4 mg via ORAL
  Filled 2018-06-22 (×6): qty 1

## 2018-06-22 MED ORDER — PANTOPRAZOLE SODIUM 20 MG PO TBEC: 20.0000 mg | DELAYED_RELEASE_TABLET | Freq: Every day | ORAL | Status: DC

## 2018-06-22 MED ORDER — ONDANSETRON HCL 4 MG PO TABS
4.0000 mg | ORAL_TABLET | Freq: Four times a day (QID) | ORAL | Status: DC | PRN
  Administered 2018-07-06: 4 mg via ORAL
  Filled 2018-06-22: qty 1

## 2018-06-22 MED ORDER — SODIUM CHLORIDE 0.9% FLUSH: 3.0000 mL | INTRAVENOUS | Status: DC | PRN

## 2018-06-22 MED ORDER — CELECOXIB 200 MG PO CAPS
200.0000 mg | ORAL_CAPSULE | Freq: Two times a day (BID) | ORAL | Status: DC
  Administered 2018-06-22 – 2018-06-28 (×12): 200 mg via ORAL
  Filled 2018-06-22 (×12): qty 1

## 2018-06-22 MED ORDER — DEXAMETHASONE SODIUM PHOSPHATE 4 MG/ML IJ SOLN: 4.0000 mg | Freq: Four times a day (QID) | INTRAMUSCULAR | Status: DC

## 2018-06-22 MED ORDER — SENNOSIDES-DOCUSATE SODIUM 8.6-50 MG PO TABS
1.0000 | ORAL_TABLET | Freq: Two times a day (BID) | ORAL | Status: DC | PRN
  Administered 2018-06-22 – 2018-07-07 (×6): 1 via ORAL
  Filled 2018-06-22 (×6): qty 1

## 2018-06-22 MED ORDER — OXYCODONE HCL 5 MG PO TABS
5.0000 mg | ORAL_TABLET | ORAL | Status: DC | PRN
  Administered 2018-06-22 – 2018-07-13 (×26): 5 mg via ORAL
  Filled 2018-06-22 (×27): qty 1

## 2018-06-22 MED ORDER — TAMSULOSIN HCL 0.4 MG PO CAPS
0.4000 mg | ORAL_CAPSULE | Freq: Every day | ORAL | Status: DC
  Administered 2018-06-23 – 2018-07-13 (×20): 0.4 mg via ORAL
  Filled 2018-06-22 (×19): qty 1

## 2018-06-22 MED ORDER — PROCHLORPERAZINE EDISYLATE 10 MG/2ML IJ SOLN: 5.0000 mg | Freq: Four times a day (QID) | INTRAMUSCULAR | Status: DC | PRN

## 2018-06-22 MED ORDER — OXYCODONE HCL 5 MG PO TABS
10.0000 mg | ORAL_TABLET | ORAL | Status: DC | PRN
  Administered 2018-06-23 – 2018-07-06 (×10): 10 mg via ORAL
  Filled 2018-06-22 (×12): qty 2

## 2018-06-22 MED ORDER — PROCHLORPERAZINE MALEATE 5 MG PO TABS: 5.0000 mg | ORAL_TABLET | Freq: Four times a day (QID) | ORAL | Status: DC | PRN

## 2018-06-22 MED ORDER — TRAZODONE HCL 50 MG PO TABS
25.0000 mg | ORAL_TABLET | Freq: Every evening | ORAL | Status: DC | PRN
Start: 2018-06-22 — End: 2018-06-29

## 2018-06-22 MED ORDER — SIMVASTATIN 40 MG PO TABS
40.0000 mg | ORAL_TABLET | Freq: Every day | ORAL | Status: DC
  Administered 2018-06-23 – 2018-07-13 (×20): 40 mg via ORAL
  Filled 2018-06-22 (×20): qty 1

## 2018-06-22 MED ORDER — ALUM & MAG HYDROXIDE-SIMETH 200-200-20 MG/5ML PO SUSP
30.0000 mL | ORAL | Status: DC | PRN
  Filled 2018-06-22: qty 30

## 2018-06-22 MED ORDER — METHOCARBAMOL 500 MG PO TABS
500.0000 mg | ORAL_TABLET | Freq: Four times a day (QID) | ORAL | Status: DC | PRN
  Administered 2018-07-06 – 2018-07-12 (×11): 500 mg via ORAL
  Filled 2018-06-22 (×12): qty 1

## 2018-06-22 MED ORDER — ONDANSETRON HCL 4 MG/2ML IJ SOLN: 4.0000 mg | Freq: Four times a day (QID) | INTRAMUSCULAR | Status: DC | PRN

## 2018-06-22 MED ORDER — SUCRALFATE 1 G PO TABS
1.5000 g | ORAL_TABLET | Freq: Every day | ORAL | Status: DC | PRN
  Administered 2018-07-03 – 2018-07-10 (×4): 1.5 g via ORAL
  Filled 2018-06-22 (×10): qty 1.5

## 2018-06-22 MED ORDER — GUAIFENESIN-DM 100-10 MG/5ML PO SYRP
5.0000 mL | ORAL_SOLUTION | Freq: Four times a day (QID) | ORAL | Status: DC | PRN
Start: 2018-06-22 — End: 2018-07-13

## 2018-06-22 MED ORDER — FLEET ENEMA 7-19 GM/118ML RE ENEM: 1.0000 | ENEMA | Freq: Once | RECTAL | Status: DC | PRN

## 2018-06-22 MED ORDER — PANTOPRAZOLE SODIUM 40 MG PO TBEC
40.0000 mg | DELAYED_RELEASE_TABLET | Freq: Every day | ORAL | Status: DC
  Administered 2018-06-23 – 2018-07-13 (×20): 40 mg via ORAL
  Filled 2018-06-22 (×20): qty 1

## 2018-06-22 MED ORDER — DIPHENHYDRAMINE HCL 12.5 MG/5ML PO ELIX: 12.5000 mg | ORAL_SOLUTION | Freq: Four times a day (QID) | ORAL | Status: DC | PRN

## 2018-06-22 MED ORDER — MENTHOL 3 MG MT LOZG
1.0000 | LOZENGE | OROMUCOSAL | Status: DC | PRN
  Filled 2018-06-22: qty 9

## 2018-06-22 MED ORDER — METHOCARBAMOL 500 MG PO TABS: 500.0000 mg | ORAL_TABLET | Freq: Four times a day (QID) | ORAL | Status: DC | PRN

## 2018-06-22 MED ORDER — BISACODYL 10 MG RE SUPP
10.0000 mg | Freq: Every day | RECTAL | Status: DC | PRN
Start: 2018-06-22 — End: 2018-07-13
  Administered 2018-06-25 – 2018-07-07 (×2): 10 mg via RECTAL
  Filled 2018-06-22 (×2): qty 1

## 2018-06-22 MED ORDER — HYDROCHLOROTHIAZIDE 12.5 MG PO CAPS
12.5000 mg | ORAL_CAPSULE | Freq: Every day | ORAL | Status: DC
  Administered 2018-06-23 – 2018-06-24 (×2): 12.5 mg via ORAL
  Filled 2018-06-22 (×2): qty 1

## 2018-06-22 MED ORDER — LISINOPRIL 10 MG PO TABS
10.0000 mg | ORAL_TABLET | Freq: Every day | ORAL | Status: DC
  Administered 2018-06-23 – 2018-07-13 (×20): 10 mg via ORAL
  Filled 2018-06-22 (×20): qty 1

## 2018-06-22 MED ORDER — METHOCARBAMOL 500 MG PO TABS: 500.0000 mg | ORAL_TABLET | Freq: Four times a day (QID) | ORAL | 0 refills | Status: DC | PRN

## 2018-06-22 MED ORDER — METHOCARBAMOL 1000 MG/10ML IJ SOLN
500.0000 mg | Freq: Four times a day (QID) | INTRAVENOUS | Status: DC | PRN
  Filled 2018-06-22: qty 5

## 2018-06-22 MED ORDER — PROCHLORPERAZINE 25 MG RE SUPP: 12.5000 mg | Freq: Four times a day (QID) | RECTAL | Status: DC | PRN

## 2018-06-22 MED ORDER — SODIUM CHLORIDE 0.9% FLUSH: 3.0000 mL | Freq: Two times a day (BID) | INTRAVENOUS | Status: DC

## 2018-06-22 MED ORDER — TRAMADOL HCL 50 MG PO TABS
50.0000 mg | ORAL_TABLET | Freq: Four times a day (QID) | ORAL | Status: DC | PRN
  Administered 2018-07-04: 50 mg via ORAL
  Filled 2018-06-22: qty 1

## 2018-06-22 MED ORDER — OXYCODONE HCL 5 MG PO TABS: 5.0000 mg | ORAL_TABLET | ORAL | 0 refills | Status: DC | PRN

## 2018-06-22 MED ORDER — PHENOL 1.4 % MT LIQD
1.0000 | OROMUCOSAL | Status: DC | PRN
  Filled 2018-06-22: qty 354

## 2018-06-22 MED ORDER — POLYETHYLENE GLYCOL 3350 17 G PO PACK
17.0000 g | PACK | Freq: Every day | ORAL | Status: DC | PRN
  Administered 2018-07-05: 17 g via ORAL
  Filled 2018-06-22: qty 1

## 2018-06-22 MED ORDER — ADULT MULTIVITAMIN W/MINERALS CH
1.0000 | ORAL_TABLET | Freq: Every day | ORAL | Status: DC
  Administered 2018-06-23 – 2018-07-12 (×20): 1 via ORAL
  Filled 2018-06-22 (×20): qty 1

## 2018-06-22 NOTE — H&P (Signed)
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. Post op course complicated by urinary retention and foley placed by GU on 7/11 with recommendations to keep foley in till 7/15 before removal/voiding trial. He has had worsening of LLE weakenss with  difficulty standing and decadron was added for decline felt to be due to traction response from surgery.  PT/OT  evaluations done yesterday revealing significant decline in mobility as was ability to carry out ADLs.  CIR was recommended due to functional decline.    Review of Systems  Constitutional: Negative for chills and fever.  HENT: Negative for hearing loss and tinnitus.   Eyes: Negative for blurred vision and double vision.  Respiratory: Negative for cough and shortness of breath.   Cardiovascular: Negative for chest pain and palpitations.  Gastrointestinal: Negative for constipation, heartburn and nausea.  Genitourinary:       Foley in place  Musculoskeletal: Negative for back pain and myalgias.  Skin: Negative for rash.  Neurological: Positive for focal weakness (LLE worse since surgery). Negative for dizziness, sensory change and headaches.  Psychiatric/Behavioral: Negative for memory loss. The patient does not have insomnia.       Past Medical History:  Diagnosis Date  . Acne   . Allergic rhinitis   . Anemia   . Anxiety   . BPH (benign prostatic hypertrophy) with urinary obstruction   . Cancer [facility])    bladder CA; remission since Oct 2014  . Carpal tunnel syndrome   .  Cervicalgia   . Degenerative disc disease, cervical    neck and back;   . Depression    controlled;   Marland Kitchen GERD (gastroesophageal reflux disease)   . Hemorrhoids   . History of malignant neoplasm of bladder   . Hyperlipidemia   . Hypertension    controlled with medication;   . Hypospadias   . Low back pain   . Mitral regurgitation   . Pre-diabetes   . Sleep apnea   . Urinary frequency     Past Surgical History:  Procedure Laterality Date  . BLADDER SURGERY     2011, 2013, 2015  . COLONOSCOPY WITH PROPOFOL N/A 07/31/2015   Procedure: COLONOSCOPY WITH PROPOFOL;  Surgeon: Manya Silvas, MD;  Location: [facility] ENDOSCOPY;  Service: Endoscopy;  Laterality: N/A;  . COLONOSCOPY, ESOPHAGOGASTRODUODENOSCOPY (EGD) AND ESOPHAGEAL DILATION    . FLEXIBLE SIGMOIDOSCOPY    . LUMBAR LAMINECTOMY/DECOMPRESSION MICRODISCECTOMY N/A 06/20/2018   Procedure: LUMBAR LAMINECTOMY/DECOMPRESSION MICRODISCECTOMY 1 LEVEL-L-2-3;  Surgeon: Meade Maw, MD;  Location: [facility] ORS;  Service: Neurosurgery;  Laterality: N/A;  . SEPTOPLASTY    . TONSILLECTOMY      Family History  Problem Relation Age of Onset  . Hyperlipidemia Brother   . Hyperlipidemia Sister     Social History:  Lives alone and was working till a couple of weeks ago. He has been staying with brother for 2 weeks PTA due to falls/back issues. reports that he has never smoked. He has never used smokeless tobacco. He reports that he does not drink alcohol or use drugs.  Allergies: No Known Allergies    Medications Prior to Admission  Medication Sig Dispense Refill  . aspirin 81 MG tablet Take 81 mg by mouth daily.    . celecoxib (CELEBREX) 200 MG capsule Take 200 mg by mouth 2 (two) times daily.    Marland Kitchen lisinopril-hydrochlorothiazide (PRINZIDE,ZESTORETIC) 10-12.5 MG tablet Take 1 tablet by mouth daily.     . Multiple Vitamin (MULTIVITAMIN WITH MINERALS) TABS tablet Take 1 tablet by mouth daily.    Marland Kitchen omeprazole (PRILOSEC OTC) 20 MG tablet  Take 20 mg by mouth daily.    Marland Kitchen OVER THE COUNTER MEDICATION Take 1.5 tablets by mouth daily as needed (constipation). PRUNEX    . simvastatin (ZOCOR) 40 MG tablet Take 40 mg by mouth daily.    . tamsulosin (FLOMAX) 0.4 MG CAPS capsule TAKE 1 CAPSULE DAILY 90 capsule 2  . traZODone (DESYREL) 100 MG tablet Take 100 mg by mouth at bedtime.     . cyclobenzaprine (FLEXERIL) 5 MG tablet Take 1 tablet (5 mg total) by mouth 3 (three) times daily as needed for muscle spasms. (Patient not taking: Reported on 06/15/2018) 15 tablet 0  . fluconazole (DIFLUCAN) 150 MG tablet Take 1 tablet (150 mg total) by mouth once a week. (Patient not taking: Reported on 06/15/2018) 6 tablet 0  . ketoconazole (NIZORAL) 2 % cream Apply 1 application topically daily. (Patient not taking: Reported on 06/15/2018) 30 g 1  . ketorolac (TORADOL) 10 MG tablet Take 1 tablet (10 mg total) by mouth every 8 (eight) hours. (Patient not taking: Reported on 06/15/2018) 15 tablet 0  . Omega-3 Fatty Acids (OMEGA 3 PO) Take 2 capsules by mouth 2 (two) times daily.    Marland Kitchen senna-docusate (SENOKOT-S) 8.6-50 MG tablet Take 1 tablet by mouth daily as needed for moderate constipation.     . sucralfate (CARAFATE) 1 G tablet Take 1.5 g by mouth daily as needed.       Drug Regimen Review  Drug regimen was reviewed and remains appropriate with no significant issues identified  Home: Home Living Family/patient expects to be discharged to:: Inpatient rehab Living Arrangements: Alone Available Help at Discharge: Family, Available 24 hours/day Type of Home: House Home Access: Stairs to enter [employer] of Steps: 4, landing, then threshold  Entrance Stairs-Rails: Can reach both, Right, Left Home Layout: One level Bathroom Shower/Tub: Tub/shower unit, Air cabin crew Accessibility: Yes Additional Comments: If he is not Mod I upon dischrage then he can go to his brother's house for assist  Lives With: Alone     Functional History: Prior Function Level of Independence: Independent  Functional Status:  Mobility: Bed Mobility Overal bed mobility: Needs Assistance Bed Mobility: Supine to Sit Supine to sit: Mod assist Sit to supine: Max assist General bed mobility comments: vc's for logrolling; minimal assist for LE's and mod assist for trunk R sidelying to sitting Transfers Overall transfer level: Needs assistance Transfers: Sit to/from Stand, Lateral/Scoot Transfers Sit to Stand: Max assist, +2 physical assistance Stand pivot transfers: Min assist, Mod assist, Max assist, +2 physical assistance General transfer comment: attempted to stand with elevated bed height and L knee blocked using RW but unable for pt to achieve lifting pt's bottom off of bed with 1 assist; 2nd attempt with 2 assist with elevated bed height and B knees blocked and use of RW max assist x2 (pt able to stand for a couple seconds with 2 assist)--vc's for UE and LE positioning required; practiced lateral scooting edge of  bed x2 trials to R and x2 trials to L (mod assist to R and max assist to L with knee blocked) with visual demo and vc's for correct technique/positioning; min to mod assist x2 lateral scoot to R bed to recliner with armrest lowered (slight downhill transfer) with multiple attempts to perform distance and knee blocked to prevent scooting forward (vc's and demo required for technique) Ambulation/Gait Ambulation/Gait assistance: (Unable to perform.) General Gait Details: Not appropriate at this time to attempt    ADL: ADL Overall ADL's : Needs assistance/impaired Eating/Feeding: Independent Grooming: Set up Upper Body Bathing: Set up Lower Body Bathing: Moderate assistance Upper Body Dressing : Minimal assistance Lower Body Dressing: Maximal assistance  Cognition: Cognition Overall Cognitive Status: Within Functional Limits for tasks assessed Orientation Level: Oriented X4 Cognition Arousal/Alertness:  Awake/alert Behavior During Therapy: WFL for tasks assessed/performed Overall Cognitive Status: Within Functional Limits for tasks assessed General Comments: Consistently follows multi-step commands  Physical Exam: Blood pressure 119/65, pulse 65, temperature 98.5 F (36.9 C), temperature source Oral, resp. rate 20, height 5\' 8"  (1.727 m), weight 81.6 kg (180 lb), SpO2 97 %. Physical Exam  Nursing note and vitals reviewed. HENT:  Mouth/Throat: No oropharyngeal exudate.  Cardiovascular:  No murmur heard. Respiratory: No stridor. No respiratory distress. He has no wheezes.  GI: He exhibits no distension. There is no tenderness.  Musculoskeletal: He exhibits no edema or tenderness.  Healing abrasion left shin.   Neurological:  Speech clear. Able to follow basic commands without difficulty.   Skin: No rash noted.  Small incision mid back irritated looking but C/D/I    Results for orders placed or performed during the hospital encounter of 06/20/18 (from the past 48 hour(s))  Glucose, capillary     Status: Abnormal   Collection Time: 06/20/18 12:22 PM  Result Value Ref Range   Glucose-Capillary 120 (H) 70 - 99 mg/dL  ABO/Rh     Status: None   Collection Time: 06/20/18 12:53 PM  Result Value Ref Range   ABO/RH(D)      A POS Performed at [facility], [address] [place], [place] [address]   Glucose, capillary     Status: Abnormal   Collection Time: 06/20/18  6:35 PM  Result Value Ref Range   Glucose-Capillary 143 (H) 70 - 99 mg/dL   Dg Lumbar Spine 2-3 Views  Result Date: 06/20/2018 CLINICAL DATA:  Back surgery. EXAM: LUMBAR SPINE - 2-3 VIEW; DG C-ARM 61-120 MIN COMPARISON:  Lumbar MRI 06/13/2018 FINDINGS: Lateral C-arm images were obtained in the operating room. Lumbosacral junction not included on the study. Based on the prior MRI, there is pedicle screw fusion L3 through S1. Surgical localizer instrument is present just above the L3 pedicle screws posteriorly.  IMPRESSION: Surgical localization in the operating room. Electronically Signed   By: Franchot Gallo M.D.   On: 06/20/2018 16:16   Dg C-arm 1-60 Min  Result Date: 06/20/2018 CLINICAL DATA:  Back surgery. EXAM: LUMBAR SPINE - 2-3 VIEW; DG C-ARM 61-120 MIN COMPARISON:  Lumbar MRI 06/13/2018 FINDINGS: Lateral C-arm images were obtained in the operating room. Lumbosacral junction not included on the study. Based on the prior MRI, there is pedicle screw fusion L3 through S1. Surgical localizer instrument is present just above the L3 pedicle screws posteriorly. IMPRESSION: Surgical localization in the operating room. Electronically Signed   By: Franchot Gallo M.D.   On: 06/20/2018 16:16       Medical Problem List and Plan: 1.  Functional  secondary to L2-3HNP s/p discectomy  -begin inpatient rehab 2.  DVT Prophylaxis/Anticoagulation: Mechanical: Sequential compression devices, below knee Bilateral lower extremities 3. Pain Management: On celebrex bid with decadron every 6 hours-- 4. Mood: team to provide ego support. LCSW to follow for evaluation and support.  5. Neuropsych: This patient is capable of making decisions on his own behalf. 6. Skin/Wound Care: monitor wound for healing. Add protein supplement to promote wound healing.  7. Fluids/Electrolytes/Nutrition: Monitor I/O. Check post op labs.  8. Urinary retention/BPH: H/o hypospadias. Keep foley in place till 7/15 then start voiding trial. Continue foley.  9. HTN: Monitor BP bid--continue HCTZ and lisinopril.  10. Prediabetes: CM diet. 11. GERD: Add PPI due to h/o of gastritis and now with celebrex and decadron on board.       Bary Leriche, PA-C 06/22/2018

## 2018-06-22 NOTE — Progress Notes (Signed)
Inpatient Rehabilitation  I have received insurance authorization for an IP Rehab admission today.  I have notified MD, RN, and nurse case manager with goal to have patient arrive by 5pm to Dr. Charm Barges service in room [phone]. Call if questions.   Carmelia Roller., CCC/SLP Admission Coordinator  [place]  Cell [phone]

## 2018-06-22 NOTE — Progress Notes (Signed)
Inpatient Rehabilitation  Met with patient at bedside to discuss team's recommendation for IP Rehab.  Shared booklets, insurance verification letter, and answered questions.  Plan to continue to follow for timing of medical readiness, insurance authorization, and IP Rehab bed availability.  Call if questions.    Carmelia Roller., CCC/SLP Admission Coordinator  [place]  Cell [phone]

## 2018-06-22 NOTE — Progress Notes (Signed)
Patient ID: Trevor [name], male   DOB: 1946-07-30, 72 y.o.   MRN: [id number] Patient arrived from [facility] via [facility] with belongings. Patient oriented to room, rehab process, rehab schedule, fall prevention plan, safety plan, and nurse call bell. Patient resting in bed with call bed at side.

## 2018-06-22 NOTE — Clinical Social Work Note (Signed)
CSW informed by nurse that Lenna Sciara with [facility]) called and stated patient had been approved to come and that patient could come today by 5. CSW contacted Melissa at [facility] and she informed me that she had contacted the physician to work on the discharge summary but that as long as the discharge order was in, she could go ahead and accept patient and get the discharge summary later. Melissa further stated that  The nurse could call report to: [phone]. Patient would be going into room 4 [address] and accepting physician would be Dr. Alger Simons.Nurse to call [facility] to transport and then call report. Shela Leff MSW,LCSW [phone]

## 2018-06-22 NOTE — Progress Notes (Signed)
06/22/2018 3:51 PM  Called report to receiving nurse at [facility] inpatient rehab.  Dola Argyle, RN

## 2018-06-22 NOTE — Discharge Instructions (Signed)
Your surgeon has performed an operation on your lumbar spine (low back) to relieve pressure on one or more nerves. Many times, patients feel better immediately after surgery and can overdo it. Even if you feel well, it is important that you follow these activity guidelines. If you do not let your back heal properly from the surgery, you can increase the chance of a disc herniation and/or return of your symptoms. The following are instructions to help in your recovery once you have been discharged from the hospital.  * Do not take anti-inflammatory medications for 3 days after surgery (naproxen [Aleve], ibuprofen [Advil, Motrin], etc.) * You may resume Omega 3 supplement after 7 days   Activity    No bending, lifting, or twisting (BLT). Avoid lifting objects heavier than 10 pounds (gallon milk jug).  Where possible, avoid household activities that involve lifting, bending, pushing, or pulling such as laundry, vacuuming, grocery shopping, and childcare. Try to arrange for help from friends and family for these activities while your back heals.  Increase physical activity slowly as tolerated.  Taking short walks is encouraged, but avoid strenuous exercise. Do not jog, run, bicycle, lift weights, or participate in any other exercises unless specifically allowed by your doctor. Avoid prolonged sitting, including car rides.  Talk to your doctor before resuming sexual activity.  You should not drive until cleared by your doctor.  Until released by your doctor, you should not return to work or school.  You should rest at home and let your body heal.   You may shower two days after your surgery.  After showering, lightly dab your incision dry. Do not take a tub bath or go swimming for 3 weeks, or until approved by your doctor at your follow-up appointment.  If you smoke, we strongly recommend that you quit.  Smoking has been proven to interfere with normal healing in your back and will dramatically  reduce the success rate of your surgery. Please contact [facility] (800-QUIT-NOW) and use the resources at www.[facility].com for assistance in stopping smoking.  Surgical Incision   If you have a dressing on your incision, you may remove it three days after your surgery. Keep your incision area clean and dry.  If you have staples or stitches on your incision, you should have a follow up scheduled for removal. If you do not have staples or stitches, you will have steri-strips (small pieces of surgical tape) or Dermabond glue. The steri-strips/glue should begin to peel away within about a week (it is fine if the steri-strips fall off before then). If the strips are still in place one week after your surgery, you may gently remove them.  Diet            You may return to your usual diet. Be sure to stay hydrated.  When to Contact us  Although your surgery and recovery will likely be uneventful, you may have some residual numbness, aches, and pains in your back and/or legs. This is normal and should improve in the next few weeks.  However, should you experience any of the following, contact us immediately:  New numbness or weakness  Pain that is progressively getting worse, and is not relieved by your pain medications or rest  Bleeding, redness, swelling, pain, or drainage from surgical incision  Chills or flu-like symptoms  Fever greater than 101.0 F (38.3 C)  Problems with bowel or bladder functions  Difficulty breathing or shortness of breath  Warmth, tenderness, or swelling in your  calf  Contact Information  During office hours (Monday-Friday 9 am to 5 pm), please call your physician at [phone]  After hours and weekends, please call the [facility] Operator at [phone] and ask for the Neurosurgery Resident On Call   For a life-threatening emergency, call 911

## 2018-06-22 NOTE — PMR Pre-admission (Signed)
[facility] PMR Admission Coordinator Pre-Admission Assessment  Patient: Trevor [name] is an 72 y.o., male MRN: [id number] DOB: 12-11-1946 Height: 5' 8"  (172.7 cm) Weight: 81.6 kg (180 lb)  Insurance Information HMO:     PPO: X     PCP:      IPA:      80/20:      OTHER:  PRIMARY: U[employer] Medicare      Policy#: [id number]      Subscriber: Self CM Name: Vevelyn Royals       Phone#: [phone]     Fax#: [phone] Pre-Cert#: [id number] given 06/22/18 by Izora Gala (phone:[phone]) for 7 days with follow up due to Vevelyn Royals 06/28/18      Employer: Full Time Benefits:  Phone #: [phone]     Name: Verified online at [email or web address] Eff. Date: 12/12/17     Deduct: $300      Out of Pocket Max: $1700      Life Max: N/A CIR: 90%/10%      SNF: 90%/10% for up to $20 per day, days 1-20; 90%/10% for up to $172 a day, days 21-120 Outpatient: Necessity, 80%     Co-Pay: 20% Home Health: Necessity, 100%      Co-Pay: $0 DME: 80%     Co-Pay: 20% Providers: In-network   SECONDARY: None        Emergency Contact Information Contact Information    Name Relation Home Work Trevor [name] [phone]  [phone]   [place], [place] [phone]        Current Medical History  Patient Admitting Diagnosis: POD2 s/p  L2/3 lumbar microdiscectomy for lumbar radiculopathy.  History of Present Illness: [name] is a 72 year old male with history of HTN, BPH, hypospadias, bladder cancer, depression/anxiety disorder, DDD with back pain with radiculopathy s/p lumbar decompression L3-S1 and ACDF 11/20/17 and was doing well he started developing recurrent pain with radiculopathy with BLE weakness and falls. 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. Post op course complicated by urinary retention and foley placed by GU on 7/11 with recommendations to keep foley in till 7/15 before removal/voiding trial. He has had difficulty standing with pain and  decadron added for decline due to traction response due to surgery.  PT/OT  evaluations done yesterday revealing significant decline in mobility as was ability to carry out ADLs.  CIR was recommended due to functional decline.  Patient's medical record from [facility] has been reviewed by the rehabilitation admission coordinator and physician.  Past Medical History  Past Medical History:  Diagnosis Date  . Acne   . Allergic rhinitis   . Anemia   . Anxiety   . BPH (benign prostatic hypertrophy) with urinary obstruction   . Cancer [facility]    bladder CA; remission since Oct 2014  . Carpal tunnel syndrome   . Cervicalgia   . Degenerative disc disease, cervical    neck and back;   . Depression    controlled;   Marland Kitchen GERD (gastroesophageal reflux disease)   . Hemorrhoids   . History of malignant neoplasm of bladder   . Hyperlipidemia   . Hypertension    controlled with medication;   . Hypospadias   . Low back pain   . Mitral regurgitation   . Pre-diabetes   . Sleep apnea   . Urinary frequency     Family History   family history includes Hyperlipidemia in his [name]  and sister.  Prior Rehab/Hospitalizations Has the patient had major surgery during 100 days prior to admission? No   Current Medications Refer to [facility] from [facility] record   Patients Current Diet:  Heart Healthy Diet Restrictions   Precautions / Restrictions Precautions Precautions: Fall Precautions/Special Needs: Hip/Back Precaution Comments: spinal precautions: no bending, lifting, twisting, arching Restrictions Weight Bearing Restrictions: No Other Position/Activity Restrictions: No brace needed per orders   Has the patient had 2 or more falls or a fall with injury in the past year?Yes  Prior Activity Level Community (5-7x/wk): Prior to the onset of his decline in mid May patient was fully independnet, living alone, and workign full time.  Since this time patient  progressed to using a rolling walker and had moved in with his [name] and sister since they had more modifications in their home; he was Mod I at this time.  Prior Functional Level Self Care: Did the patient need help bathing, dressing, using the toilet or eating? Independent  Indoor Mobility: Did the patient need assistance with walking from room to room (with or without device)? Independent  Stairs: Did the patient need assistance with internal or external stairs (with or without device)? Independent  Functional Cognition: Did the patient need help planning regular tasks such as shopping or remembering to take medications? [place] / Equipment Home Assistive Devices/Equipment: Other (Comment), Cane (specify quad or straight), Walker (specify type)(shoe horn; reacher; both; standard)  Prior Device Use: Indicate devices/aids used by the patient prior to current illness, exacerbation or injury? Walker, Rolling walker   Prior Functional Level Current Functional Level  Bed Mobility  Independent   Mod A   Transfers  Independent   Min A-Max A+2   Mobility - Walk/Wheelchair  Independent   TBA   Upper Body Dressing  Independent   Min A   Lower Body Dressing  Independent   Max A   Grooming  Independent   Set-up assist   Eating/Drinking  Independent   Independent    Toilet Transfer  Independent   TBA   Bladder Continence   Independent    Total A with internal foley due to acute urinary retention    Bowel Management  Independent    TBA, none since admission on 06/20/18   Stair Climbing  Independent    TBA   Communication  Independent    Independent    Memory  Independent    Independent    Cooking/Meal Prep  Independent       Housework  Independent     Money Management  Independent     Driving  Independent       Special needs/care consideration BiPAP/CPAP: Patient reports that he has CPAP at home, but did not use  it CPM: No Continuous Drip IV: No Dialysis: No        Life Vest: No Oxygen: No Special Bed: No Trach Size: No Wound Vac (area): No       Skin: Monitoring back surgical incision; bruising to right inside of upper arm                                Bowel mgmt: None since admission  Bladder mgmt: Acute urinary retention with internal foley in place  Diabetic mgmt: Reports being pre-diabetic   Previous Home Environment Living Arrangements: Alone  Lives With: Alone Available Help at Discharge: Family, Available 24 hours/day  Type of Home: House Home Layout: One level Home Access: Stairs to enter Entrance Stairs-Rails: Can reach both, Right, Left Entrance Stairs-Number of Steps: 4, landing, then threshold  Bathroom Shower/Tub: Tub/shower unit, Architectural technologist: Standard Bathroom Accessibility: Yes How Accessible: Accessible via walker Home Care Services: No Additional Comments: If he is not Mod I upon dischrage then he can go to his [name]'s house for assist  Discharge Living Setting Plans for Discharge Living Setting: Other (Comment)(If not Mod I then his [name]'s house ) Type of Home at Discharge: House Discharge Home Layout: One level Discharge Home Access: Stairs to enter Entrance Stairs-Rails: Right, Left, Can reach both Entrance Stairs-Number of Steps: 4 Discharge Bathroom Shower/Tub: Walk-in shower Discharge Bathroom Toilet: Standard Discharge Bathroom Accessibility: Yes How Accessible: Accessible via walker Does the patient have any problems obtaining your medications?: No  Social/Family/Support Systems Patient Roles: Other (Comment)(Family member, [name] ) Contact Information: [name] and sister-in-law: Regional[place] and Aggie Anticipated Caregiver: Sister-in-law Aggie Anticipated Caregiver's Contact Information: Cell: [phone] Ability/Limitations of Caregiver: None anticipated at this time Caregiver Availability: 24/7 Discharge Plan Discussed with  Primary Caregiver: Yes Is Caregiver In Agreement with Plan?: Yes Does Caregiver/Family have Issues with Lodging/Transportation while Pt is in Rehab?: No  Goals/Additional Needs Patient/Family Goal for Rehab: PT/OT: Mod I-Supervision Expected length of stay: 11-16 days Cultural Considerations: [employer]  Dietary Needs: Heart Healthy diet restrictions  Equipment Needs: TBD Pt/Family Agrees to Admission and willing to participate: Yes Program Orientation Provided & Reviewed with Pt/Caregiver Including Roles  & Responsibilities: Yes  Patient Condition: I have met with the patient at bedside and reviewed his medical record from [facility].  He was independent, active, and working following his back surgery in December up until May when he started to decline.  Since this time he has had to move in with his [name] and sister-in-law and is unable to work a full day.  Post op he has had more of a functional decline, continued pain, and acute urinary retention and requires the coordinated medical and rehabilitation that CIR offers in order to maximize his functional independence and return home.    Preadmission Screen Completed By:  Gunnar Fusi, 06/22/2018 10:24 AM ______________________________________________________________________   Discussed status with Dr. Naaman Plummer on 06/22/18 at 3:00 PM and received telephone approval for admission today.  Admission Coordinator:  Gunnar Fusi, time 3:00 /Date 06/22/18   Assessment/Plan: Diagnosis: lumbar radiculopathy, HNP 1. Does the need for close, 24 hr/day  Medical supervision in concert with the patient's rehab needs make it unreasonable for this patient to be served in a less intensive setting? Yes 2. Co-Morbidities requiring supervision/potential complications: HTN, BPH, bladder/anxiety,  3. Due to bladder management, bowel management, safety, skin/wound care, disease management, medication administration, pain management and patient education, does the patient  require 24 hr/day rehab nursing? Yes 4. Does the patient require coordinated care of a physician, rehab nurse, PT (1-2 hrs/day, 5 days/week) and OT (1-2 hrs/day, 5 days/week) to address physical and functional deficits in the context of the above medical diagnosis(es)? Yes Addressing deficits in the following areas: balance, endurance, locomotion, strength, transferring, bowel/bladder control, bathing, dressing, feeding, grooming and psychosocial support 5. Can the patient actively participate in an intensive therapy program of at least 3 hrs of therapy 5 days a week? Yes 6. The potential for patient to make measurable gains while on inpatient rehab is excellent 7. Anticipated functional outcomes upon discharge from inpatients are: modified independent and supervision PT, modified independent and supervision OT, n/a SLP  8. Estimated rehab length of stay to reach the above functional goals is: 11-16 days 9. Does the patient have adequate social supports to accommodate these discharge functional goals? Yes 10. Anticipated D/C setting: Home 11. Anticipated post D/C treatments: HH therapy and Outpatient therapy 12. Overall Rehab/Functional Prognosis: excellent    RECOMMENDATIONS: This patient's condition is appropriate for continued rehabilitative care in the following setting: CIR Patient has agreed to participate in recommended program. Yes Note that insurance prior authorization may be required for reimbursement for recommended care.  Comment: admit to inpatient rehab today  Meredith Staggers, MD, [facility] 06/22/2018    Gunnar Fusi 06/22/2018

## 2018-06-22 NOTE — Progress Notes (Signed)
Physical Therapy Treatment Patient Details Name: Trevor [name] MRN: [id number] DOB: 06-May-1946 Today's Date: 06/22/2018    History of Present Illness Patient is a 72 year old male admitted s/p L2/3 lumbar microdiscectomy.  PMH includes cervical DJD, CA, BPH, Htn and anxiety.    PT Comments    Pt able to perform lateral scooting on edge of bed to R with mod assist and to L with max assist (knee blocked; pt with increased difficulty lateral scooting to L).  Able to perform lateral scoot transfer bed to recliner (with multiple attempts) to R (armrest lowered) with min to mod assist x2 (slight downhill transfer).  Max assist x2 with B knees blocked and bed elevated to stand with RW.  Visual demo and verbal/tactile cueing given for all transfers to improve technique.  Tolerated LE ex's in bed well.  Pt follows multiple step commands well and appears very motivated to participate in PT.  Overall pt tolerated session's activities well with only report of mild fatigue when pt was asked end of session (pt did not c/o of fatigue during session and pt attempted everything therapist asked of pt to perform).  Pt appears like an excellent candidate for CIR; pt appears like he would be able to tolerate 3 hours of therapy a day.  Pt progressing with functional mobility and balance compared to yesterday.  Will continue to progress pt with strengthening, balance, and progressive functional mobility for optimal functional independence.   Follow Up Recommendations  CIR     Equipment Recommendations  Other (comment)(TBD at next facility)    Recommendations for Other Services       Precautions / Restrictions Precautions Precautions: Fall Precaution Comments: spinal precautions: no bending, lifting, twisting, arching Restrictions Weight Bearing Restrictions: No Other Position/Activity Restrictions: No brace needed per orders    Mobility  Bed Mobility Overal bed mobility: Needs Assistance Bed Mobility:  Supine to Sit     Supine to sit: Mod assist     General bed mobility comments: vc's for logrolling; minimal assist for LE's and mod assist for trunk R sidelying to sitting  Transfers Overall transfer level: Needs assistance   Transfers: Sit to/from Stand;Lateral/Scoot Transfers Sit to Stand: Max assist;+2 physical assistance Stand pivot transfers: Min assist;Mod assist;Max assist;+2 physical assistance       General transfer comment: attempted to stand with elevated bed height and L knee blocked using RW but unable for pt to achieve lifting pt's bottom off of bed with 1 assist; 2nd attempt with 2 assist with elevated bed height and B knees blocked and use of RW max assist x2 (pt able to stand for a couple seconds with 2 assist)--vc's for UE and LE positioning required; practiced lateral scooting edge of bed x2 trials to R and x2 trials to L (mod assist to R and max assist to L with knee blocked) with visual demo and vc's for correct technique/positioning; min to mod assist x2 lateral scoot to R bed to recliner with armrest lowered (slight downhill transfer) with multiple attempts to perform distance and knee blocked to prevent scooting forward (vc's and demo required for technique)  Ambulation/Gait             General Gait Details: Not appropriate at this time to attempt   Stairs             Wheelchair Mobility    Modified Rankin (Stroke Patients Only)       Balance Overall balance assessment: Needs assistance Sitting-balance support:  Bilateral upper extremity supported;Feet supported Sitting balance-Leahy Scale: Good Sitting balance - Comments: pt able to reach within BOS without loss of balance       Standing balance comment: 2 assist to maintain standing balance with RW                            Cognition Arousal/Alertness: Awake/alert Behavior During Therapy: WFL for tasks assessed/performed Overall Cognitive Status: Within Functional Limits  for tasks assessed                                 General Comments: Consistently follows multi-step commands      Exercises Total Joint Exercises Ankle Circles/Pumps: AROM;Strengthening;Both;10 reps;Supine Quad Sets: AROM;Strengthening;Both;10 reps;Supine Short Arc Quad: AAROM;Strengthening;Both;10 reps;Supine Heel Slides: AAROM;Strengthening;Both;10 reps;Supine Hip ABduction/ADduction: AAROM;Strengthening;Both;10 reps;Supine    General Comments   Nursing cleared pt for participation in physical therapy.  Pt agreeable to PT session.      Pertinent Vitals/Pain Pain Assessment: 0-10 Pain Score: 2  Pain Location: L lower leg Pain Descriptors / Indicators: Aching Pain Intervention(s): Limited activity within patient's tolerance;Monitored during session;Repositioned     Home Living        Prior Function          PT Goals (current goals can now be found in the care plan section) Acute Rehab PT Goals Patient Stated Goal: To be able to walk again PT Goal Formulation: With patient Time For Goal Achievement: 07/06/18 Potential to Achieve Goals: Fair Progress towards PT goals: Progressing toward goals    Frequency    BID      PT Plan Current plan remains appropriate    Co-evaluation              AM-PAC PT "6 Clicks" Daily Activity  Outcome Measure  Difficulty turning over in bed (including adjusting bedclothes, sheets and blankets)?: A Lot Difficulty moving from lying on back to sitting on the side of the bed? : A Lot Difficulty sitting down on and standing up from a chair with arms (e.g., wheelchair, bedside commode, etc,.)?: Unable Help needed moving to and from a bed to chair (including a wheelchair)?: Total Help needed walking in hospital room?: Total Help needed climbing 3-5 steps with a railing? : Total 6 Click Score: 8    End of Session Equipment Utilized During Treatment: Gait belt(gait belt positioned up high off of  incision) Activity Tolerance: Patient tolerated treatment well Patient left: in chair;with call bell/phone within reach;with chair alarm set;Other (comment)(B heels elevated via pillow) Nurse Communication: Mobility status;Precautions(transfer technique with 2 assist back to chair and to drop L armrest for transfer) PT Visit Diagnosis: Muscle weakness (generalized) (M62.81);Other abnormalities of gait and mobility (R26.89);Difficulty in walking, not elsewhere classified (R26.2)     Time: 7829-5621 PT Time Calculation (min) (ACUTE ONLY): 48 min  Charges:  $Therapeutic Exercise: 8-22 mins $Therapeutic Activity: 23-37 mins                    G CodesLeitha Bleak, PT 06/22/18, 10:35 AM [phone]

## 2018-06-22 NOTE — Discharge Summary (Signed)
Procedure: L2-3 microdiscectomy Procedure Date: 06/20/2018 Diagnosis: Lumbar radiculopathy  History: Trevor [name] is POD2 s/p  L2/3 lumbar microdiscectomy for lumbar radiculopathy. On POD1 neuro exam was significant for lower extremity weakness most prominent in bilateral quads and unable to bear weight. Received PT and OT evaluation and both were in agreement that patient would benefit from [facility].  Patient was also having trouble urinating. Urology was consulted and catheter was placed. He is set up for voiding trial on Monday 06/25/2018.  POD2 evaluation revealed persistent weakness in quads and he was still unable to bear weight, but feels that he is making slow improvement. Accepted into CIR for continued care.   Patient has no complaints including back pain, lower extremity pain/numbness/tingling.   Physical Exam: Vitals:   06/22/18 1217 06/22/18 1219  BP: 127/67   Pulse: 73   Resp:  20  Temp: 98.1 F (36.7 C)   SpO2: 97%     AA Ox3 CNI Skin: incision site intact. Glue present and intact. No drainage.  Strength:  LLE: 4/5 illiopsoas, 2/5 quad. Otherwise 5/5 RLE: 4+/5 iliopsoas, 2/5 quad, 4+ dorsiflexion. Otherwise 5/5 Sensation: intact and symmetric  Data:  Recent Labs  Lab 06/18/18 1415  NA 136  K 4.3  CL 101  CO2 28  BUN 22  CREATININE 0.89  GLUCOSE 98  CALCIUM 9.4   No results for input(s): AST, ALT, ALKPHOS in the last 168 hours.  Invalid input(s): TBILI   Recent Labs  Lab 06/18/18 1415  WBC 7.3  HGB 13.1  HCT 37.9*  PLT 149*   Recent Labs  Lab 06/18/18 1415  APTT 29  INR 1.11         Other tests/results: No imaging reviewed.   Assessment/Plan:  Trevor [name] is POD2 s/p L2-3 lumbar decompression/microdiscectomy for lumbar radiculopathy with bilateral lower extremity weakness most prominent in quadriceps, resulting in inability to bear weight. He has been evaluated and approved from [facility] inpatient rehab for continued care in  improving weakness. Continue post op pain control with Celebrex, tylenol, oxycodone, and robaxin PRN.  Post-op follow-up appointment scheduled July 25th at clinic.   Marin Olp PA-C Department of Neurosurgery

## 2018-06-22 NOTE — Progress Notes (Addendum)
Procedure: L2-3 microdiscectomy Procedure Date: 06/20/2018 Diagnosis: Lumbar radiculopathy  History: Trevor [name] is POD2 s/p  L2/3 lumbar microdiscectomy for lumbar radiculopathy.  Pain symptoms continue to remain resolved however he continues to have persistent weakness in lower extremities and unable to bear weight.  Continues to perform leg exercises and has noticed slight improvements since yesterday.  Continues to deny numbness.  No other complaints at this time.  Physical Exam: Vitals:   06/21/18 2122 06/22/18 0537  BP: 130/82 119/65  Pulse: 80 65  Resp: 20 20  Temp: 98.4 F (36.9 C) 98.5 F (36.9 C)  SpO2: 97% 97%    AA Ox3 CNI Skin: incision site intact. Glue present and intact. No drainage.  Strength:  LLE: 4/5 illiopsoas, 2/5 quad. Otherwise 5/5 RLE: 4+/5 iliopsoas, 2/5 quad, 4+ dorsiflexion. Otherwise 5/5 Sensation: intact and symmetric  Data:  Recent Labs  Lab 06/18/18 1415  NA 136  K 4.3  CL 101  CO2 28  BUN 22  CREATININE 0.89  GLUCOSE 98  CALCIUM 9.4   No results for input(s): AST, ALT, ALKPHOS in the last 168 hours.  Invalid input(s): TBILI   Recent Labs  Lab 06/18/18 1415  WBC 7.3  HGB 13.1  HCT 37.9*  PLT 149*   Recent Labs  Lab 06/18/18 1415  APTT 29  INR 1.11         Other tests/results: No imaging reviewed.   Assessment/Plan:  Trevor [name] is POD2 s/p L2-3 lumbar decompression/microdiscectomy for lumbar radiculopathy with complaints of worsening weakness.  After PT and OT evaluation yesterday is agreed upon that patient would benefit from inpatient rehab.  Awaiting insurance approval at this time.  Catheter still in place and home appointment for removal has been canceled in anticipation of admission to rehab.  Pain remains adequately controlled and he has no complaints today other than weakness.  - mobilize - pain control - DVT prophylaxis - PTOT  Marin Olp PA-C Department of Neurosurgery

## 2018-06-23 ENCOUNTER — Inpatient Hospital Stay (HOSPITAL_COMMUNITY): Payer: Medicare Other | Admitting: Physical Therapy

## 2018-06-23 ENCOUNTER — Inpatient Hospital Stay (HOSPITAL_COMMUNITY): Payer: Medicare Other | Admitting: Occupational Therapy

## 2018-06-23 DIAGNOSIS — I10 Essential (primary) hypertension: Secondary | ICD-10-CM

## 2018-06-23 DIAGNOSIS — R339 Retention of urine, unspecified: Secondary | ICD-10-CM

## 2018-06-23 DIAGNOSIS — M5116 Intervertebral disc disorders with radiculopathy, lumbar region: Secondary | ICD-10-CM

## 2018-06-23 LAB — CBC WITH DIFFERENTIAL/PLATELET
ABS IMMATURE GRANULOCYTES: 0.1 10*3/uL (ref 0.0–0.1)
Basophils Absolute: 0 10*3/uL (ref 0.0–0.1)
Basophils Relative: 0 %
EOS PCT: 0 %
Eosinophils Absolute: 0 10*3/uL (ref 0.0–0.7)
HEMATOCRIT: 34.7 % — AB (ref 39.0–52.0)
Hemoglobin: 11.4 g/dL — ABNORMAL LOW (ref 13.0–17.0)
Immature Granulocytes: 1 %
Lymphocytes Relative: 7 %
Lymphs Abs: 0.9 10*3/uL (ref 0.7–4.0)
MCH: 29.1 pg (ref 26.0–34.0)
MCHC: 32.9 g/dL (ref 30.0–36.0)
MCV: 88.5 fL (ref 78.0–100.0)
MONO ABS: 0.7 10*3/uL (ref 0.1–1.0)
MONOS PCT: 6 %
Neutro Abs: 10.6 10*3/uL — ABNORMAL HIGH (ref 1.7–7.7)
Neutrophils Relative %: 86 %
Platelets: 126 10*3/uL — ABNORMAL LOW (ref 150–400)
RBC: 3.92 MIL/uL — ABNORMAL LOW (ref 4.22–5.81)
RDW: 11.9 % (ref 11.5–15.5)
WBC: 12.3 10*3/uL — ABNORMAL HIGH (ref 4.0–10.5)

## 2018-06-23 LAB — COMPREHENSIVE METABOLIC PANEL
ALK PHOS: 61 U/L (ref 38–126)
ALT: 23 U/L (ref 0–44)
AST: 26 U/L (ref 15–41)
Albumin: 3.3 g/dL — ABNORMAL LOW (ref 3.5–5.0)
Anion gap: 10 (ref 5–15)
BILIRUBIN TOTAL: 0.6 mg/dL (ref 0.3–1.2)
BUN: 31 mg/dL — AB (ref 8–23)
CALCIUM: 9.1 mg/dL (ref 8.9–10.3)
CHLORIDE: 100 mmol/L (ref 98–111)
CO2: 26 mmol/L (ref 22–32)
CREATININE: 0.99 mg/dL (ref 0.61–1.24)
GFR calc Af Amer: >60 mL/min (ref >60)
GLUCOSE: 131 mg/dL — AB (ref 70–99)
Potassium: 4.2 mmol/L (ref 3.5–5.1)
SODIUM: 136 mmol/L (ref 135–145)
Total Protein: 6.3 g/dL — ABNORMAL LOW (ref 6.5–8.1)

## 2018-06-23 NOTE — Plan of Care (Signed)
LTGs established 06/23/18

## 2018-06-23 NOTE — Evaluation (Signed)
Physical Therapy Assessment and Plan  Patient Details  Name: Trevor [name] MRN: [id number] Date of Birth: 03-Aug-1946  PT Diagnosis: Abnormal posture, Abnormality of gait, Difficulty walking, Low back pain, Muscle weakness and Paraplegia Rehab Potential: Good ELOS: 12-15 days    Today's Date: 06/23/2018 PT Individual Time: 1000-1100 PT Individual Time Calculation (min): 60 min    Problem List:  Patient Active Problem List   Diagnosis Date Noted  . Herniation of lumbar intervertebral disc with radiculopathy 06/22/2018  . Lumbar radiculopathy 06/20/2018  . Nocturia 10/23/2015  . Absolute anemia 05/13/2014  . Benign essential HTN 05/13/2014  . Blood glucose elevated 05/13/2014  . LBP (low back pain) 05/13/2014  . HLD (hyperlipidemia) 05/13/2014  . Malignant neoplasm of lateral wall of urinary bladder ([place]) 09/19/2013  . Benign prostatic hypertrophy without urinary obstruction 12/10/2012  . Hypospadias 12/10/2012  . Microscopic hematuria 12/10/2012  . History of neoplasm of bladder 12/10/2012  . FOM (frequency of micturition) 12/10/2012    Past Medical History:  Past Medical History:  Diagnosis Date  . Acne   . Allergic rhinitis   . Anemia   . Anxiety   . BPH (benign prostatic hypertrophy) with urinary obstruction   . Cancer [facility])    bladder CA; remission since Oct 2014  . Carpal tunnel syndrome   . Cervicalgia   . Degenerative disc disease, cervical    neck and back;   . Depression    controlled;   Marland Kitchen Gastritis   . GERD (gastroesophageal reflux disease)   . Hemorrhoids   . History of malignant neoplasm of bladder   . Hyperlipidemia   . Hypertension    controlled with medication;   . Hypospadias   . Low back pain   . Mitral regurgitation   . Pre-diabetes   . Sleep apnea   . Urinary frequency    Past Surgical History:  Past Surgical History:  Procedure Laterality Date  . BLADDER SURGERY     2011, 2013, 2015  . COLONOSCOPY WITH PROPOFOL N/A 07/31/2015    Procedure: COLONOSCOPY WITH PROPOFOL;  Surgeon: Manya Silvas, MD;  Location: [facility] ENDOSCOPY;  Service: Endoscopy;  Laterality: N/A;  . COLONOSCOPY, ESOPHAGOGASTRODUODENOSCOPY (EGD) AND ESOPHAGEAL DILATION    . FLEXIBLE SIGMOIDOSCOPY    . LUMBAR LAMINECTOMY/DECOMPRESSION MICRODISCECTOMY N/A 06/20/2018   Procedure: LUMBAR LAMINECTOMY/DECOMPRESSION MICRODISCECTOMY 1 LEVEL-L-2-3;  Surgeon: Meade Maw, MD;  Location: [facility] ORS;  Service: Neurosurgery;  Laterality: N/A;  . SEPTOPLASTY    . TONSILLECTOMY      Assessment & Plan Clinical Impression: Patient is a 72 year old male with history of HTN, BPH, hypospadias, bladder cancer, depression/anxiety disorder, DDD with back pain with radiculopathyand LLE weaknesss/p lumbar decompression L3-S1 11/20/17 and was doing well he started developing recurrent pain with radiculopathy with BLE weakness and falls. 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. Post op course complicated by urinary retention and foley placed by GU on 7/11 with recommendations to keep foley in till 7/15 before removal/voiding trial. He has hadworsening of LLE weakenss withdifficulty standing and decadron wasadded for decline felt to bedue to traction responsefromsurgery. PT/OT evaluations done yesterday revealing significant decline in mobility as was ability to carry out ADLs.  Patient transferred to CIR on 06/22/2018 .   Patient currently requires max with mobility secondary to muscle weakness, decreased cardiorespiratoy endurance, abnormal tone and unbalanced muscle activation and decreased sitting balance, decreased standing balance, decreased balance strategies and paraplegia.  Prior  to hospitalization, patient was independent  with mobility and lived with Alone, Family(May d/c home or with brother and sister-in-law depending on CIR progress) in a House home.  Home access is 4, landing, then threshold Stairs to  enter.  Patient will benefit from skilled PT intervention to maximize safe functional mobility, minimize fall risk and decrease caregiver burden for planned discharge home with intermittent assist.  Anticipate patient will benefit from follow up [facility] at discharge.  PT - End of Session Activity Tolerance: Tolerates 10 - 20 min activity with multiple rests Endurance Deficit: Yes PT Assessment Rehab Potential (ACUTE/IP ONLY): Good PT Barriers to Discharge: Inaccessible home environment;Decreased caregiver support;Home environment access/layout PT Patient demonstrates impairments in the following area(s): Balance;Edema;Endurance;Motor;Pain;Safety;Skin Integrity PT Transfers Functional Problem(s): Bed Mobility;Bed to Chair;Car;Furniture;Floor PT Locomotion Functional Problem(s): Ambulation;Wheelchair Mobility;Stairs PT Plan PT Intensity: Minimum of 1-2 x/day ,45 to 90 minutes PT Frequency: 5 out of 7 days PT Duration Estimated Length of Stay: 12-15 days  PT Treatment/Interventions: Ambulation/gait training;Balance/vestibular training;Community reintegration;Discharge planning;Disease management/prevention;DME/adaptive equipment instruction;Functional electrical stimulation;Functional mobility training;Neuromuscular re-education;Pain management;Patient/family education;Psychosocial support;Skin care/wound management;Splinting/orthotics;Therapeutic Activities;Stair training;UE/LE Coordination activities;Visual/perceptual remediation/compensation;Wheelchair propulsion/positioning;UE/LE Strength taining/ROM;Therapeutic Exercise PT Transfers Anticipated Outcome(s): Mod I with LRAD  PT Locomotion Anticipated Outcome(s): Mod I at [facility] level. Moderate assist ambulation with LRAD for short distances.  PT Recommendation Follow Up Recommendations: Home health PT Patient destination: Home Equipment Recommended: Wheelchair cushion (measurements);Wheelchair (measurements);Rolling walker with 5" wheels;Sliding  board  Skilled Therapeutic Intervention Pt received supine in bed and agreeable to PT. Supine>sit transfer with max assist assist and max cues for technique and safety. PT instructed patient in PT Evaluation and initiated treatment intervention; see below for results. PT educated patient in [place], rehab potential, rehab goals, and discharge recommendations. Patient returned to room and left sitting in [facility] with call bell in reach and all needs met.        PT Evaluation Precautions/Restrictions Precautions Precautions: Fall Precaution Comments: spinal precautions: no bending, lifting, twisting, arching Restrictions Weight Bearing Restrictions: No Other Position/Activity Restrictions: No brace needed per orders General   Vital Signs Pain Pain Assessment Pain Scale: 0-10 Pain Score: 4  Pain Type: Surgical pain Pain Orientation: Right;Left Pain Descriptors / Indicators: Aching Pain Frequency: Intermittent Pain Onset: On-going Patients Stated Pain Goal: 2 Pain Intervention(s): Medication (See eMAR) Home Living/Prior Functioning Home Living Available Help at Discharge: Family;Available 24 hours/day Type of Home: House Home Access: Stairs to enter [employer] of Steps: 4, landing, then threshold  Entrance Stairs-Rails: Can reach both;Right;Left Home Layout: One level Bathroom Shower/Tub: Tub/shower unit;Curtain Biochemist, clinical: Standard Bathroom Accessibility: Yes Additional Comments: If he is not Mod I upon dischrage then he can go to his brother's house for assist  Lives With: Alone;Family(May d/c home or with brother and sister-in-law depending on CIR progress) Prior Function Level of Independence: Independent with gait;Independent with transfers;Independent with basic ADLs;Independent with homemaking with ambulation  Able to Take Stairs?: Yes Driving: Yes Vocation: Full time employment Vision/Perception  Perception Perception: Within Functional  Limits Praxis Praxis: Intact  Cognition Overall Cognitive Status: Within Functional Limits for tasks assessed Arousal/Alertness: Awake/alert Orientation Level: Oriented to person;Oriented to place;Disoriented to time;Oriented to situation Attention: Sustained Sustained Attention: Appears intact Memory: Appears intact Memory Impairment: Storage deficit Awareness: Appears intact Problem Solving: Appears intact Safety/Judgment: Appears intact Sensation Sensation Light Touch: Appears Intact Hot/Cold: Appears Intact Proprioception: Appears Intact Coordination Gross Motor Movements are Fluid and Coordinated: No Fine Motor Movements are Fluid and Coordinated: Yes Coordination and Movement Description: limited  coordination in BLE due to strength deficits at the hip and knee.  Finger Nose Finger Test: [facility] Motor  Motor Motor: Paraplegia Motor - Skilled Clinical Observations: Paraplegia L>R. Hip fleixon and knee extion greatest deficits.   Mobility Bed Mobility Bed Mobility: Rolling Right;Rolling Left;Sit to Supine;Supine to Sit Rolling Right: Minimal Assistance - Patient > 75%(with rails) Rolling Left: Minimal Assistance - Patient > 75%(with rails) Supine to Sit: Maximal Assistance - Patient - Patient 25-49%(with rails) Sit to Supine: Maximal Assistance - Patient 25-49% Transfers Transfers: Stand to Sit;Sit to [employer];Lateral/Scoot Transfers Sit to Stand: Maximal Assistance - Patient 25-49%(parallel bars ) Stand to Sit: Maximal Assistance - Patient 25-49%(parallel bars ) Squat Pivot Transfers: Maximal Assistance - Patient 25-49% Lateral/Scoot Transfers: Moderate Assistance - Patient 50-74%(SB) Locomotion  Gait Ambulation: No Gait Gait: No Stairs / Additional Locomotion Stairs: No Wheelchair Mobility Wheelchair Mobility: Yes Wheelchair Assistance: Development worker, international aid: Both upper extremities Wheelchair Parts Management: Needs  assistance Distance: 149f   Trunk/Postural Assessment  Cervical Assessment Cervical Assessment: Within Functional Limits Thoracic Assessment Thoracic Assessment: Within Functional Limits Lumbar Assessment Lumbar Assessment: Exceptions to WFL(Lumbar back surgery) Postural Control Postural Control: Deficits on evaluation  Balance Balance Balance Assessed: Yes Static Sitting Balance Static Sitting - Level of Assistance: 5: Stand by assistance Dynamic Sitting Balance Dynamic Sitting - Level of Assistance: 4: Min assist Dynamic Sitting - Balance Activities: Reaching for objects;Forward lean/weight shifting Sitting balance - Comments: Steady assist dynamic sitting while bathing in w/c Static Standing Balance Static Standing - Balance Support: Bilateral upper extremity supported Static Standing - Level of Assistance: 3: Mod assist Static Standing - Comment/# of Minutes: parallel bars  Extremity Assessment  RUE Assessment RUE Assessment: Within Functional Limits(4+/5 proximal to distal) Active Range of Motion (AROM) Comments: No functional deficits  LUE Assessment LUE Assessment: Within Functional Limits(4+/5 proximal to distal) Active Range of Motion (AROM) Comments: No functional deficits RLE Assessment RLE Assessment: Exceptions to [facility]ive Range of Motion (PROM) Comments: WFL Active Range of Motion (AROM) Comments: AAROM WFL, AROM: unable to extend knee beyond -80degrees full extension  General Strength Comments: hip flexion and knee extension 2/5. all others tested: grossly 4/5  LLE Assessment LLE Assessment: Exceptions to [facility] (PROM) Comments: WW[facility], LLCActive Range of Motion (AROM) Comments: AAROM WFL. AROM: unable to extend knee beyand -85 degress from full extension General Strength Comments: hip flexion and knee extension 2/5. all others tested: grossly 4/5    See Function Navigator for Current Functional Status.   Refer to Care Plan for Long Term  Goals  Recommendations for other services: Neuropsych and Therapeutic Recreation  Kitchen group, Stress management and Outing/community reintegration  Discharge Criteria: Patient will be discharged from PT if patient refuses treatment 3 consecutive times without medical reason, if treatment goals not met, if there is a change in medical status, if patient makes no progress towards goals or if patient is discharged from hospital.  The above assessment, treatment plan, treatment alternatives and goals were discussed and mutually agreed upon: by patient  ALorie Phenix7/13/2019, 1:22 PM

## 2018-06-23 NOTE — H&P (Signed)
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. Post op course complicated by urinary retention and foley placed by GU on 7/11 with recommendations to keep foley in till 7/15 before removal/voiding trial. He has hadworsening of LLE weakenss withdifficulty standing and decadron wasadded for decline felt to bedue to traction responsefromsurgery. PT/OT evaluations done yesterday revealing significant decline in mobility as was ability to carry out ADLs. CIR was recommended due to functional decline.    Review of Systems  Constitutional: Negative forchillsand fever.  HENT: Negative forhearing lossand tinnitus.  Eyes: Negative forblurred visionand double vision.  Respiratory: Negative forcoughand shortness of breath.  Cardiovascular: Negative forchest painand palpitations.  Gastrointestinal: Negative forconstipation,heartburnand nausea.  Genitourinary: Foley in place Musculoskeletal: Negative forback painand myalgias.  Skin: Negative forrash.  Neurological: Positive forfocal weakness(LLE worse since surgery). Negative fordizziness,sensory changeand headaches.  Psychiatric/Behavioral: Negative formemory loss. The patientdoes not have insomnia.        Past Medical History:  Diagnosis Date  . Acne   . Allergic rhinitis   . Anemia   . Anxiety   . BPH (benign prostatic hypertrophy) with urinary obstruction   . Cancer [facility])    bladder CA; remission since Oct 2014  . Carpal  tunnel syndrome   . Cervicalgia   . Degenerative disc disease, cervical    neck and back;   . Depression    controlled;   Marland Kitchen GERD (gastroesophageal reflux disease)   . Hemorrhoids   . History of malignant neoplasm of bladder   . Hyperlipidemia   . Hypertension    controlled with medication;   . Hypospadias   . Low back pain   . Mitral regurgitation   . Pre-diabetes   . Sleep apnea   . Urinary frequency          Past Surgical History:  Procedure Laterality Date  . BLADDER SURGERY     2011, 2013, 2015  . COLONOSCOPY WITH PROPOFOL N/A 07/31/2015   Procedure: COLONOSCOPY WITH PROPOFOL; Surgeon: Manya Silvas, MD; Location[facility] ENDOSCOPY; Service: Endoscopy; Laterality: N/A;  . COLONOSCOPY, ESOPHAGOGASTRODUODENOSCOPY (EGD) AND ESOPHAGEAL DILATION    . FLEXIBLE SIGMOIDOSCOPY    . LUMBAR LAMINECTOMY/DECOMPRESSION MICRODISCECTOMY N/A 06/20/2018   Procedure: LUMBAR LAMINECTOMY/DECOMPRESSION MICRODISCECTOMY 1 LEVEL-L-2-3; Surgeon: Meade Maw, MD; Location: [facility] ORS; Service: Neurosurgery; Laterality: N/A;  . SEPTOPLASTY    . TONSILLECTOMY           Family History  Problem Relation Age of Onset  . Hyperlipidemia Brother   . Hyperlipidemia Sister     Social History:Lives aloneand was working till a couple of weeks ago. He has beenstaying with brother for 2 weeks PTA due tofalls/back issues.reports that he has never smoked. He has never used smokeless tobacco. He reports that he does not drink alcohol or use drugs.   Allergies:No Known Allergies         Medications Prior to Admission  Medication Sig Dispense Refill  . aspirin 81 MG tablet Take 81 mg by mouth daily.    . celecoxib (CELEBREX) 200 MG capsule Take 200 mg by mouth 2 (two) times  daily.    . lisinopril-hydrochlorothiazide (PRINZIDE,ZESTORETIC) 10-12.5 MG tablet Take 1 tablet by mouth daily.     . Multiple Vitamin (MULTIVITAMIN  WITH MINERALS) TABS tablet Take 1 tablet by mouth daily.    Marland Kitchen omeprazole (PRILOSEC OTC) 20 MG tablet Take 20 mg by mouth daily.    Marland Kitchen OVER THE COUNTER MEDICATION Take 1.5 tablets by mouth daily as needed (constipation). PRUNEX    . simvastatin (ZOCOR) 40 MG tablet Take 40 mg by mouth daily.    . tamsulosin (FLOMAX) 0.4 MG CAPS capsule TAKE 1 CAPSULE DAILY 90 capsule 2  . traZODone (DESYREL) 100 MG tablet Take 100 mg by mouth at bedtime.     . cyclobenzaprine (FLEXERIL) 5 MG tablet Take 1 tablet (5 mg total) by mouth 3 (three) times daily as needed for muscle spasms. (Patient not taking: Reported on 06/15/2018) 15 tablet 0  . fluconazole (DIFLUCAN) 150 MG tablet Take 1 tablet (150 mg total) by mouth once a week. (Patient not taking: Reported on 06/15/2018) 6 tablet 0  . ketoconazole (NIZORAL) 2 % cream Apply 1 application topically daily. (Patient not taking: Reported on 06/15/2018) 30 g 1  . ketorolac (TORADOL) 10 MG tablet Take 1 tablet (10 mg total) by mouth every 8 (eight) hours. (Patient not taking: Reported on 06/15/2018) 15 tablet 0  . Omega-3 Fatty Acids (OMEGA 3 PO) Take 2 capsules by mouth 2 (two) times daily.    Marland Kitchen senna-docusate (SENOKOT-S) 8.6-50 MG tablet Take 1 tablet by mouth daily as needed for moderate constipation.     . sucralfate (CARAFATE) 1 G tablet Take 1.5 g by mouth daily as needed.      Drug Regimen Review Drug regimen was reviewed and remains appropriate with no significant issues identified  Home: Home Living Family/patient expects to be discharged to:: Inpatient rehab Living Arrangements: Alone Available Help at Discharge: Family, Available 24 hours/day Type of Home: House Home Access: Stairs to enter [employer] of Steps: 4, landing, then threshold  Entrance Stairs-Rails: Can reach both, Right, Left Home Layout: One level Bathroom Shower/Tub: Tub/shower unit, Air cabin crew Accessibility: Yes Additional  Comments: If he is not Mod I upon dischrage then he can go to his brother's house for assist Lives With: Alone  Functional History: Prior Function Level of Independence: Independent  Functional Status: Mobility: Bed Mobility Overal bed mobility: Needs Assistance Bed Mobility: Supine to Sit Supine to sit: Mod assist Sit to supine: Max assist General bed mobility comments: vc's for logrolling; minimal assist for LE's and mod assist for trunk R sidelying to sitting Transfers Overall transfer level: Needs assistance Transfers: Sit to/from Stand, Lateral/Scoot Transfers Sit to Stand: Max assist, +2 physical assistance Stand pivot transfers: Min assist, Mod assist, Max assist, +2 physical assistance General transfer comment: attempted to stand with elevated bed height and L knee blocked using RW but unable for pt to achieve lifting pt's bottom off of bed with 1 assist; 2nd attempt with 2 assist with elevated bed height and B knees blocked and use of RW max assist x2 (pt able to stand for a couple seconds with 2 assist)--vc's for UE and LE positioning required; practiced lateral scooting edge of bed x2 trials to R and x2 trials to L (mod assist to R and max assist to L with knee blocked) with visual demo and vc's for correct technique/positioning; min to mod assist x2 lateral scoot to R bed to recliner with armrest lowered (slight downhill transfer) with multiple attempts  to perform distance and knee blocked to prevent scooting forward (vc's and demo required for technique) Ambulation/Gait Ambulation/Gait assistance: (Unable to perform.) General Gait Details: Not appropriate at this time to attempt  ADL: ADL Overall ADL's : Needs assistance/impaired Eating/Feeding: Independent Grooming: Set up Upper Body Bathing: Set up Lower Body Bathing: Moderate assistance Upper Body Dressing : Minimal assistance Lower Body Dressing: Maximal assistance  Cognition: Cognition Overall Cognitive  Status: Within Functional Limits for tasks assessed Orientation Level: Oriented X4 Cognition Arousal/Alertness: Awake/alert Behavior During Therapy: WFL for tasks assessed/performed Overall Cognitive Status: Within Functional Limits for tasks assessed General Comments: Consistently follows multi-step commands  Physical Exam: Blood pressure 119/65, pulse 65, temperature 98.5 F (36.9 C), temperature source Oral, resp. rate 20, height 5\' 8"  (1.727 m), weight 81.6 kg (180 lb), SpO2 97 %. Physical Exam Constitutional: No distress . Vital signs reviewed. HEENT: EOMI, oral membranes moist Neck: supple Cardiovascular: RRR without murmur. No JVD    Respiratory: CTA Bilaterally without wheezes or rales. Normal effort    GI: BS +, non-tender, non-distended .  Musculoskeletal: He exhibits noedemaor tenderness. Healing abrasion left shin. Neurological: Speech clear. Able to follow basic commands without difficulty.UE 5/5. HAB 3+ to 4/5. ADF 4-/5. APF 4+/5. HF 3/5. KE 4/5. No sensory findings Skin:No rashnoted.  Surgical incision CDI Psych: pleasant and appropriate   LabResultsLast48Hours        Results for orders placed or performed during the hospital encounter of 06/20/18 (from the past 48 hour(s))  Glucose, capillary Status: Abnormal   Collection Time: 06/20/18 12:22 PM  Result Value Ref Range   Glucose-Capillary 120 (H) 70 - 99 mg/dL  ABO/Rh Status: None   Collection Time: 06/20/18 12:53 PM  Result Value Ref Range   ABO/RH(D)      A POS Performed at [facility], [address] [place], [place] [address]   Glucose, capillary Status: Abnormal   Collection Time: 06/20/18 6:35 PM  Result Value Ref Range   Glucose-Capillary 143 (H) 70 - 99 mg/dL      ImagingResults(Last48hours)  Dg Lumbar Spine 2-3 Views  Result Date: 06/20/2018 CLINICAL DATA: Back surgery. EXAM: LUMBAR SPINE - 2-3 VIEW; DG C-ARM 61-120 MIN  COMPARISON: Lumbar MRI 06/13/2018 FINDINGS: Lateral C-arm images were obtained in the operating room. Lumbosacral junction not included on the study. Based on the prior MRI, there is pedicle screw fusion L3 through S1. Surgical localizer instrument is present just above the L3 pedicle screws posteriorly. IMPRESSION: Surgical localization in the operating room. Electronically Signed By: Franchot Gallo M.D. On: 06/20/2018 16:16   Dg C-arm 1-60 Min  Result Date: 06/20/2018 CLINICAL DATA: Back surgery. EXAM: LUMBAR SPINE - 2-3 VIEW; DG C-ARM 61-120 MIN COMPARISON: Lumbar MRI 06/13/2018 FINDINGS: Lateral C-arm images were obtained in the operating room. Lumbosacral junction not included on the study. Based on the prior MRI, there is pedicle screw fusion L3 through S1. Surgical localizer instrument is present just above the L3 pedicle screws posteriorly. IMPRESSION: Surgical localization in the operating room. Electronically Signed By: Franchot Gallo M.D. On: 06/20/2018 16:16        Medical Problem List and Plan: 1.Functionalsecondary to L2-3HNP s/p discectomy -begin inpatient rehab  -pt with associated motor weakness at level and below level of injury 2. DVT Prophylaxis/Anticoagulation: Mechanical:Sequential compression devices, below kneeBilateral lower extremities 3. Pain Management:On celebrex bid with decadron every 6 hours--  -appears controlled at present 4. Mood:team to provide ego support. LCSW to follow for evaluation and support. 5. Neuropsych: This  patientiscapable of making decisions on hisown behalf. 6. Skin/Wound Care:monitor wound for healing. Add protein supplement to promote wound healing. 7. Fluids/Electrolytes/Nutrition:Monitor I/O. Check post op labs.  8. Urinary retention/BPH:H/o hypospadias.Keep foley in place till 7/15 then start voiding trial.Continue foley. 9. HTN: Monitor BP bid--continue HCTZ and lisinopril.  10.  Prediabetes: CM diet. 11. GERD: Add PPI due to h/o of gastritis and now with celebrex and decadron on board.   Post Admission Physician Evaluation: 1. Functional deficits secondary  to lumbar HNP, radiculopathy. 2. Patient is admitted to receive collaborative, interdisciplinary care between the physiatrist, rehab nursing staff, and therapy team. 3. Patient's level of medical complexity and substantial therapy needs in context of that medical necessity cannot be provided at a lesser intensity of care such as a SNF. 4. Patient has experienced substantial functional loss from his/her baseline which was documented above under the "Functional History" and "Functional Status" headings.  Judging by the patient's diagnosis, physical exam, and functional history, the patient has potential for functional progress which will result in measurable gains while on inpatient rehab.  These gains will be of substantial and practical use upon discharge  in facilitating mobility and self-care at the household level. 5. Physiatrist will provide 24 hour management of medical needs as well as oversight of the therapy plan/treatment and provide guidance as appropriate regarding the interaction of the two. 6. The Preadmission Screening has been reviewed and patient status is unchanged unless otherwise stated above. 7. 24 hour rehab nursing will assist with bladder management, bowel management, safety, skin/wound care, disease management, medication administration, pain management and patient education  and help integrate therapy concepts, techniques,education, etc. 8. PT will assess and treat for/with: Lower extremity strength, range of motion, stamina, balance, functional mobility, safety, adaptive techniques and equipment, NMR, pain control.   Goals are: mod I. 9. OT will assess and treat for/with: ADL's, functional mobility, safety, upper extremity strength, adaptive techniques and equipment, NMR, pain control, family  ed.   Goals are: mod I to supervision. Therapy may proceed with showering this patient. 10. SLP will assess and treat for/with: n/a.  Goals are: n/a. 11. Case Management and Social Worker will assess and treat for psychological issues and discharge planning. 12. Team conference will be held weekly to assess progress toward goals and to determine barriers to discharge. 13. Patient will receive at least 3 hours of therapy per day at least 5 days per week. 14. ELOS: 10-15 days       15. Prognosis:  excellent     Meredith Staggers, MD, Mellody Drown  Bary Leriche, PA-C 06/22/2018

## 2018-06-23 NOTE — H&P (Signed)
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. Post op course complicated by urinary retention and foley placed by GU on 7/11 with recommendations to keep foley in till 7/15 before removal/voiding trial. He has had worsening of LLE weakenss with  difficulty standing and decadron was added for decline felt to be due to traction response from surgery.  PT/OT  evaluations done yesterday revealing significant decline in mobility as was ability to carry out ADLs.  CIR was recommended due to functional decline.    Review of Systems  Constitutional: Negative for chills and fever.  HENT: Negative for hearing loss and tinnitus.   Eyes: Negative for blurred vision and double vision.  Respiratory: Negative for cough and shortness of breath.   Cardiovascular: Negative for chest pain and palpitations.  Gastrointestinal: Negative for constipation, heartburn and nausea.  Genitourinary:       Foley in place  Musculoskeletal: Negative for back pain and myalgias.  Skin: Negative for rash.  Neurological: Positive for focal weakness (LLE worse since surgery). Negative for dizziness, sensory change and headaches.  Psychiatric/Behavioral: Negative for memory loss. The patient does not have insomnia.           Past Medical History:  Diagnosis Date  . Acne   . Allergic rhinitis   . Anemia   . Anxiety   . BPH (benign prostatic hypertrophy) with urinary obstruction   . Cancer [facility])    bladder CA; remission since Oct 2014  . Carpal tunnel  syndrome   . Cervicalgia   . Degenerative disc disease, cervical    neck and back;   . Depression    controlled;   Marland Kitchen GERD (gastroesophageal reflux disease)   . Hemorrhoids   . History of malignant neoplasm of bladder   . Hyperlipidemia   . Hypertension    controlled with medication;   . Hypospadias   . Low back pain   . Mitral regurgitation   . Pre-diabetes   . Sleep apnea   . Urinary frequency          Past Surgical History:  Procedure Laterality Date  . BLADDER SURGERY     2011, 2013, 2015  . COLONOSCOPY WITH PROPOFOL N/A 07/31/2015   Procedure: COLONOSCOPY WITH PROPOFOL;  Surgeon: Manya Silvas, MD;  Location: [facility] ENDOSCOPY;  Service: Endoscopy;  Laterality: N/A;  . COLONOSCOPY, ESOPHAGOGASTRODUODENOSCOPY (EGD) AND ESOPHAGEAL DILATION    . FLEXIBLE SIGMOIDOSCOPY    . LUMBAR LAMINECTOMY/DECOMPRESSION MICRODISCECTOMY N/A 06/20/2018   Procedure: LUMBAR LAMINECTOMY/DECOMPRESSION MICRODISCECTOMY 1 LEVEL-L-2-3;  Surgeon: Meade Maw, MD;  Location: [facility] ORS;  Service: Neurosurgery;  Laterality: N/A;  . SEPTOPLASTY    . TONSILLECTOMY           Family History  Problem Relation Age of Onset  . Hyperlipidemia Brother   . Hyperlipidemia Sister     Social History:  Lives alone and was working till a couple of weeks ago. He has been staying with brother for 2 weeks PTA due to falls/back issues. reports that he has never smoked. He has never  used smokeless tobacco. He reports that he does not drink alcohol or use drugs.    Allergies: No Known Allergies          Medications Prior to Admission  Medication Sig Dispense Refill  . aspirin 81 MG tablet Take 81 mg by mouth daily.    . celecoxib (CELEBREX) 200 MG capsule Take 200 mg by mouth 2 (two) times daily.    Marland Kitchen lisinopril-hydrochlorothiazide (PRINZIDE,ZESTORETIC) 10-12.5 MG tablet Take 1 tablet by mouth daily.     . Multiple Vitamin (MULTIVITAMIN WITH MINERALS) TABS  tablet Take 1 tablet by mouth daily.    Marland Kitchen omeprazole (PRILOSEC OTC) 20 MG tablet Take 20 mg by mouth daily.    Marland Kitchen OVER THE COUNTER MEDICATION Take 1.5 tablets by mouth daily as needed (constipation). PRUNEX    . simvastatin (ZOCOR) 40 MG tablet Take 40 mg by mouth daily.    . tamsulosin (FLOMAX) 0.4 MG CAPS capsule TAKE 1 CAPSULE DAILY 90 capsule 2  . traZODone (DESYREL) 100 MG tablet Take 100 mg by mouth at bedtime.     . cyclobenzaprine (FLEXERIL) 5 MG tablet Take 1 tablet (5 mg total) by mouth 3 (three) times daily as needed for muscle spasms. (Patient not taking: Reported on 06/15/2018) 15 tablet 0  . fluconazole (DIFLUCAN) 150 MG tablet Take 1 tablet (150 mg total) by mouth once a week. (Patient not taking: Reported on 06/15/2018) 6 tablet 0  . ketoconazole (NIZORAL) 2 % cream Apply 1 application topically daily. (Patient not taking: Reported on 06/15/2018) 30 g 1  . ketorolac (TORADOL) 10 MG tablet Take 1 tablet (10 mg total) by mouth every 8 (eight) hours. (Patient not taking: Reported on 06/15/2018) 15 tablet 0  . Omega-3 Fatty Acids (OMEGA 3 PO) Take 2 capsules by mouth 2 (two) times daily.    Marland Kitchen senna-docusate (SENOKOT-S) 8.6-50 MG tablet Take 1 tablet by mouth daily as needed for moderate constipation.     . sucralfate (CARAFATE) 1 G tablet Take 1.5 g by mouth daily as needed.       Drug Regimen Review  Drug regimen was reviewed and remains appropriate with no significant issues identified  Home: Home Living Family/patient expects to be discharged to:: Inpatient rehab Living Arrangements: Alone Available Help at Discharge: Family, Available 24 hours/day Type of Home: House Home Access: Stairs to enter [employer] of Steps: 4, landing, then threshold  Entrance Stairs-Rails: Can reach both, Right, Left Home Layout: One level Bathroom Shower/Tub: Tub/shower unit, Air cabin crew Accessibility: Yes Additional Comments: If he is not  Mod I upon dischrage then he can go to his brother's house for assist  Lives With: Alone   Functional History: Prior Function Level of Independence: Independent  Functional Status:  Mobility: Bed Mobility Overal bed mobility: Needs Assistance Bed Mobility: Supine to Sit Supine to sit: Mod assist Sit to supine: Max assist General bed mobility comments: vc's for logrolling; minimal assist for LE's and mod assist for trunk R sidelying to sitting Transfers Overall transfer level: Needs assistance Transfers: Sit to/from Stand, Lateral/Scoot Transfers Sit to Stand: Max assist, +2 physical assistance Stand pivot transfers: Min assist, Mod assist, Max assist, +2 physical assistance General transfer comment: attempted to stand with elevated bed height and L knee blocked using RW but unable for pt to achieve lifting pt's bottom off of bed with 1 assist; 2nd attempt with 2 assist with elevated bed height and B knees blocked and use of RW max assist x2 (  pt able to stand for a couple seconds with 2 assist)--vc's for UE and LE positioning required; practiced lateral scooting edge of bed x2 trials to R and x2 trials to L (mod assist to R and max assist to L with knee blocked) with visual demo and vc's for correct technique/positioning; min to mod assist x2 lateral scoot to R bed to recliner with armrest lowered (slight downhill transfer) with multiple attempts to perform distance and knee blocked to prevent scooting forward (vc's and demo required for technique) Ambulation/Gait Ambulation/Gait assistance: (Unable to perform.) General Gait Details: Not appropriate at this time to attempt  ADL: ADL Overall ADL's : Needs assistance/impaired Eating/Feeding: Independent Grooming: Set up Upper Body Bathing: Set up Lower Body Bathing: Moderate assistance Upper Body Dressing : Minimal assistance Lower Body Dressing: Maximal assistance  Cognition: Cognition Overall Cognitive Status: Within Functional  Limits for tasks assessed Orientation Level: Oriented X4 Cognition Arousal/Alertness: Awake/alert Behavior During Therapy: WFL for tasks assessed/performed Overall Cognitive Status: Within Functional Limits for tasks assessed General Comments: Consistently follows multi-step commands  Physical Exam: Blood pressure 119/65, pulse 65, temperature 98.5 F (36.9 C), temperature source Oral, resp. rate 20, height 5\' 8"  (1.727 m), weight 81.6 kg (180 lb), SpO2 97 %. Physical Exam  Nursing note and vitals reviewed. HENT:  Mouth/Throat: No oropharyngeal exudate.  Cardiovascular:  No murmur heard. Respiratory: No stridor. No respiratory distress. He has no wheezes.  GI: He exhibits no distension. There is no tenderness.  Musculoskeletal: He exhibits no edema or tenderness.  Healing abrasion left shin.   Neurological:  Speech clear. Able to follow basic commands without difficulty.   Skin: No rash noted.  Small incision mid back irritated looking but C/D/I    LabResultsLast48Hours        Results for orders placed or performed during the hospital encounter of 06/20/18 (from the past 48 hour(s))  Glucose, capillary     Status: Abnormal   Collection Time: 06/20/18 12:22 PM  Result Value Ref Range   Glucose-Capillary 120 (H) 70 - 99 mg/dL  ABO/Rh     Status: None   Collection Time: 06/20/18 12:53 PM  Result Value Ref Range   ABO/RH(D)      A POS Performed at [facility], [address] [place], [place] [address]   Glucose, capillary     Status: Abnormal   Collection Time: 06/20/18  6:35 PM  Result Value Ref Range   Glucose-Capillary 143 (H) 70 - 99 mg/dL      ImagingResults(Last48hours)  Dg Lumbar Spine 2-3 Views  Result Date: 06/20/2018 CLINICAL DATA:  Back surgery. EXAM: LUMBAR SPINE - 2-3 VIEW; DG C-ARM 61-120 MIN COMPARISON:  Lumbar MRI 06/13/2018 FINDINGS: Lateral C-arm images were obtained in the operating room. Lumbosacral junction not  included on the study. Based on the prior MRI, there is pedicle screw fusion L3 through S1. Surgical localizer instrument is present just above the L3 pedicle screws posteriorly. IMPRESSION: Surgical localization in the operating room. Electronically Signed   By: Franchot Gallo M.D.   On: 06/20/2018 16:16   Dg C-arm 1-60 Min  Result Date: 06/20/2018 CLINICAL DATA:  Back surgery. EXAM: LUMBAR SPINE - 2-3 VIEW; DG C-ARM 61-120 MIN COMPARISON:  Lumbar MRI 06/13/2018 FINDINGS: Lateral C-arm images were obtained in the operating room. Lumbosacral junction not included on the study. Based on the prior MRI, there is pedicle screw fusion L3 through S1. Surgical localizer instrument is present just above the L3 pedicle screws posteriorly. IMPRESSION: Surgical  localization in the operating room. Electronically Signed   By: Franchot Gallo M.D.   On: 06/20/2018 16:16        Medical Problem List and Plan: 1.  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  DVT Prophylaxis/Anticoagulation: Mechanical: Sequential compression devices, below knee Bilateral lower extremities 3. Pain Management: On celebrex bid with decadron every 6 hours-- 4. Mood: team to provide ego support. LCSW to follow for evaluation and support.  5. Neuropsych: This patient is capable of making decisions on his own behalf. 6. Skin/Wound Care: monitor wound for healing. Add protein supplement to promote wound healing.  7. Fluids/Electrolytes/Nutrition: Monitor I/O. Check post op labs.  8. Urinary retention/BPH: H/o hypospadias. Keep foley in place till 7/15 then start voiding trial. Continue foley.  9. HTN: Monitor BP bid--continue HCTZ and lisinopril.  10. Prediabetes: CM diet. 11. GERD: Add PPI due to h/o of gastritis and now with celebrex and decadron on board.       Meredith Staggers, MD, Mellody Drown  Bary Leriche, PA-C 06/22/2018

## 2018-06-23 NOTE — Progress Notes (Signed)
Physical Therapy Session Note  Patient Details  Name: Trevor [name] MRN: [id number] Date of Birth: May 28, 1946  Today's Date: 06/23/2018 PT Individual Time: 1330-1445 PT Individual Time Calculation (min): 75 min   Short Term Goals: Week 1:  PT Short Term Goal 1 (Week 1): Pt will perform bed mobility with min assist  PT Short Term Goal 2 (Week 1): [name] with min assist and LRAD  PT Short Term Goal 3 (Week 1): [name] PT Short Term Goal 4 (Week 1): [name] >2 minutes with min-mod assist PT Short Term Goal 5 (Week 1): [name] with min assist.   Skilled Therapeutic Interventions/Progress Updates:   Pt in w/c and agreeable to therapy. Pt self-propelled w/c around unit w/ supervision using BUEs in 50-150' bouts for overall endurance. Performed kinetron @ 50 cm/sec, 30-60 sec on and 10 sec off x10 bouts for BLE muscle activation and strengthening. No manual assistance needed, pt holding LEs in neutral sagittal plane alignment. Worked on sit<>stands in standing frame w/ emphasis on quad and glut activation. Performed sit<>stand x3 reps w/ max-total assist. Able to tolerate standing for 3-4 minutes at a time before needing seated rest break 2/2 fatigue. Worked on TKEs in standing w/ both LEs, manual and tactile cues for control of movement and to prevent hyperextension. Did not require sling and any manual assistance to maintain standing w/ UE support. Verbal cues for upright posture to protect back musculature. Returned to room and transferred to EOB w/ mod assist using slide board. Ended session in supine, call bell within reach and all needs met.   Therapy Documentation Precautions:  Precautions Precautions: Fall Precaution Comments: spinal precautions: no bending, lifting, twisting, arching Restrictions Weight Bearing Restrictions: No Other Position/Activity Restrictions: No brace needed per  orders  See Function Navigator for Current Functional Status.   Therapy/Group: Individual Therapy  [name] 06/23/2018, 4:04 PM

## 2018-06-23 NOTE — Evaluation (Signed)
Occupational Therapy Assessment and Plan  Patient Details  Name: Trevor [name] MRN: [id number] Date of Birth: [birth year]  OT Diagnosis: lumbago (low back pain), muscle weakness (generalized) and paraplegia Rehab Potential: Rehab Potential (ACUTE ONLY): Good ELOS: 14-16 days   Today's Date: 06/23/2018 OT Individual Time: 2751-7001 OT Individual Time Calculation (min): 57 min     Problem List:  Patient Active Problem List   Diagnosis Date Noted  . Herniation of lumbar intervertebral disc with radiculopathy 06/22/2018  . Lumbar radiculopathy 06/20/2018  . Nocturia 10/23/2015  . Absolute anemia 05/13/2014  . Benign essential HTN 05/13/2014  . Blood glucose elevated 05/13/2014  . LBP (low back pain) 05/13/2014  . HLD (hyperlipidemia) 05/13/2014  . Malignant neoplasm of lateral wall of urinary bladder ([place]) 09/19/2013  . Benign prostatic hypertrophy without urinary obstruction 12/10/2012  . Hypospadias 12/10/2012  . Microscopic hematuria 12/10/2012  . History of neoplasm of bladder 12/10/2012  . FOM (frequency of micturition) 12/10/2012    Past Medical History:  Past Medical History:  Diagnosis Date  . Acne   . Allergic rhinitis   . Anemia   . Anxiety   . BPH (benign prostatic hypertrophy) with urinary obstruction   . Cancer [facility])    bladder CA; remission since Oct 2014  . Carpal tunnel syndrome   . Cervicalgia   . Degenerative disc disease, cervical    neck and back;   . Depression    controlled;   Marland Kitchen Gastritis   . GERD (gastroesophageal reflux disease)   . Hemorrhoids   . History of malignant neoplasm of bladder   . Hyperlipidemia   . Hypertension    controlled with medication;   . Hypospadias   . Low back pain   . Mitral regurgitation   . Pre-diabetes   . Sleep apnea   . Urinary frequency    Past Surgical History:  Past Surgical History:  Procedure Laterality Date  . BLADDER SURGERY     2011, 2013, 2015  . COLONOSCOPY WITH PROPOFOL N/A 07/31/2015   Procedure: COLONOSCOPY WITH PROPOFOL;  Surgeon: Manya Silvas, MD;  Location: [facility] ENDOSCOPY;  Service: Endoscopy;  Laterality: N/A;  . COLONOSCOPY, ESOPHAGOGASTRODUODENOSCOPY (EGD) AND ESOPHAGEAL DILATION    . FLEXIBLE SIGMOIDOSCOPY    . LUMBAR LAMINECTOMY/DECOMPRESSION MICRODISCECTOMY N/A 06/20/2018   Procedure: LUMBAR LAMINECTOMY/DECOMPRESSION MICRODISCECTOMY 1 LEVEL-L-2-3;  Surgeon: Meade Maw, MD;  Location: [facility] ORS;  Service: Neurosurgery;  Laterality: N/A;  . SEPTOPLASTY    . TONSILLECTOMY      Assessment & Plan Clinical Impression: Trevor [name] is a 72 year old male with history of HTN, BPH, hypospadias, bladder cancer, depression/anxiety disorder, DDD with back pain with radiculopathyand LLE weaknesss/p lumbar decompression L3-S1 11/20/17 and was doing well he started developing recurrent pain with radiculopathy with BLE weakness and falls. 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. Post op course complicated by urinary retention and foley placed by GU on 7/11 with recommendations to keep foley in till 7/15 before removal/voiding trial. He has hadworsening of LLE weakenss withdifficulty standing and decadron wasadded for decline felt to bedue to traction responsefromsurgery. PT/OT evaluations done yesterday revealing significant decline in mobility as was ability to carry out ADLs. CIR was recommended due to functional decline.  Patient currently requires mod with basic self-care skills secondary to muscle weakness, decreased cardiorespiratoy endurance, decreased coordination and decreased standing balance, decreased postural control and decreased balance strategies.  Prior to hospitalization, patient could complete BADLs  with modified independent .  Patient will benefit from skilled intervention to increase independence with basic self-care skills prior to discharge home alone or home with brother + sister-in-law.   Anticipate patient will require intermittent supervision and follow up home health.  OT - End of Session Endurance Deficit: Yes OT Assessment Rehab Potential (ACUTE ONLY): Good OT Barriers to Discharge: Lack of/limited family support;Medical stability OT Patient demonstrates impairments in the following area(s): Balance;Safety;Endurance;Motor OT Basic ADL's Functional Problem(s): Grooming;Bathing;Dressing;Toileting OT Advanced ADL's Functional Problem(s): Simple Meal Preparation OT Transfers Functional Problem(s): Toilet;Tub/Shower OT Additional Impairment(s): None OT Plan OT Intensity: Minimum of 1-2 x/day, 45 to 90 minutes OT Duration/Estimated Length of Stay: 14-16 days OT Treatment/Interventions: Balance/vestibular training;Community reintegration;Disease mangement/prevention;Functional electrical stimulation;Neuromuscular re-education;Patient/family education;Self Care/advanced ADL retraining;Splinting/orthotics;Therapeutic Exercise;UE/LE Coordination activities;Wheelchair propulsion/positioning;Visual/perceptual remediation/compensation;UE/LE Strength taining/ROM;Therapeutic Activities;Skin care/wound managment;Psychosocial support;Pain management;Functional mobility training;Cognitive remediation/compensation;Discharge planning;DME/adaptive equipment instruction OT Self Feeding Anticipated Outcome(s): No goal OT Basic Self-Care Anticipated Outcome(s): Supervision-Mod I  OT Toileting Anticipated Outcome(s): Mod I  OT Bathroom Transfers Anticipated Outcome(s): Min A-Mod I  OT Recommendation Recommendations for Other Services: Therapeutic Recreation consult Therapeutic Recreation Interventions: Pet therapy Patient destination: Home Follow Up Recommendations: Home health OT Equipment Recommended: To be determined  Skilled Therapeutic Intervention Pt greeted up in w/c with no c/o pain. Skilled OT session completed with focus on initial evaluation, education on OT role/POC, and  establishment of patient-centered goals. He completed bathing/dressing w/c level sit<stand with Stedy. Pt required overall Mod A due to back precautions. He was able to achieve figure 4 position using both UEs to lift and one arm to maintain position. Mod A sit<stand in Sims with pts knees buckling x2 while OT completed pericare and lifted pants over hips. At end of session pt was left in w/c with all needs within reach.   OT Evaluation Precautions/Restrictions  Precautions Precautions: Fall Precaution Comments: spinal precautions: no bending, lifting, twisting, arching Restrictions Weight Bearing Restrictions: No Other Position/Activity Restrictions: No brace needed per orders General Chart Reviewed: Yes Family/Caregiver Present: No Pain Pain Assessment Pain Scale: 0-10 Pain Score: 4  Pain Type: Surgical pain Pain Orientation: Right;Left Pain Descriptors / Indicators: Aching Pain Frequency: Intermittent Pain Onset: On-going Patients Stated Pain Goal: 2 Pain Intervention(s): Medication (See eMAR) Home Living/Prior Functioning Home Living Family/patient expects to be discharged to:: Private residence Living Arrangements: Alone Available Help at Discharge: Family, Available 24 hours/day Type of Home: House Home Access: Stairs to enter [employer] of Steps: 4, landing, then threshold  Entrance Stairs-Rails: Can reach both, Right, Left Home Layout: One level Bathroom Shower/Tub: Tub/shower unit, Air cabin crew Accessibility: Yes Additional Comments: If he is not Mod I upon dischrage then he can go to his brother's house for assist  Lives With: Alone, Family(May d/c home or with brother and sister-in-law depending on CIR progress) IADL History Homemaking Responsibilities: (IADL roles may be delegated to family if he d/c with brother) Occupation: Full time employment Type of Occupation: works for [employer] Leisure and Hobbies: Texting and  playing games on tablet Prior Function Level of Independence: Independent with gait, Independent with transfers, Independent with basic ADLs, Independent with homemaking with ambulation  Able to Take Stairs?: Yes Driving: Yes ADL ADL ADL Comments: Please see functional navigator for ADL status Vision Baseline Vision/History: Wears glasses Wears Glasses: At all times Patient Visual Report: No change from baseline Perception  Perception: Within Functional Limits Praxis Praxis: Intact Cognition Overall Cognitive Status: Within Functional Limits for tasks assessed Arousal/Alertness: Awake/alert Orientation Level:  Person;Place;Situation, time Person: Oriented Place: Oriented Situation: Oriented Year: 2019 Month: July Day of Week: Correct Memory: Appears intact Immediate Memory Recall: Sock;Blue;Bed Memory Recall: Sock;Blue;Bed Memory Recall Sock: Without Cue Memory Recall Blue: Without Cue Memory Recall Bed: Without Cue Attention: Sustained Sustained Attention: Appears intact Awareness: Appears intact Problem Solving: Appears intact Safety/Judgment: Appears intact Sensation Sensation Light Touch: Appears Intact Hot/Cold: Appears Intact Proprioception: Appears Intact Coordination Gross Motor Movements are Fluid and Coordinated: No Fine Motor Movements are Fluid and Coordinated: Yes Coordination and Movement Description: limited coordination in BLE due to strength deficits at the hip and knee.  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  Balance Balance Balance Assessed: Yes Dynamic Sitting Balance Sitting balance - Comments: Steady assist dynamic sitting while bathing in w/c  Dynamic standing comments: Mod A while assisting with elevating pants over hips (in [place]) Extremity/Trunk Assessment RUE Assessment RUE Assessment: Within Functional Limits(4+/5 proximal to distal) Active Range of  Motion (AROM) Comments: No functional deficits observed LUE Assessment LUE Assessment: Within Functional Limits(4+/5 proximal to distal) Active Range of Motion (AROM) Comments: No functional deficit observed   See Function Navigator for Current Functional Status.   Refer to Care Plan for Long Term Goals  Recommendations for other services: Therapeutic Recreation  Pet therapy   Discharge Criteria: Patient will be discharged from OT if patient refuses treatment 3 consecutive times without medical reason, if treatment goals not met, if there is a change in medical status, if patient makes no progress towards goals or if patient is discharged from hospital.  The above assessment, treatment plan, treatment alternatives and goals were discussed and mutually agreed upon: by patient  Skeet Simmer 06/23/2018, 1:01 PM

## 2018-06-23 NOTE — Anesthesia Postprocedure Evaluation (Signed)
Anesthesia Post Note  Patient: Trevor [name]  Procedure(s) Performed: LUMBAR LAMINECTOMY/DECOMPRESSION MICRODISCECTOMY 1 LEVEL-L-2-3 (N/A Back)  Patient location during evaluation: PACU Anesthesia Type: General Level of consciousness: awake and alert Pain management: pain level controlled Vital Signs Assessment: post-procedure vital signs reviewed and stable Respiratory status: spontaneous breathing, nonlabored ventilation, respiratory function stable and patient connected to nasal cannula oxygen Cardiovascular status: blood pressure returned to baseline and stable Postop Assessment: no apparent nausea or vomiting Anesthetic complications: no     Last Vitals:  Vitals:   06/22/18 1217 06/22/18 1219  BP: 127/67   Pulse: 73   Resp:  20  Temp: 36.7 C   SpO2: 97%     Last Pain:  Vitals:   06/22/18 1217  TempSrc: Oral  PainSc:                  Martha Clan

## 2018-06-24 ENCOUNTER — Inpatient Hospital Stay (HOSPITAL_COMMUNITY): Payer: Medicare Other

## 2018-06-24 ENCOUNTER — Inpatient Hospital Stay (HOSPITAL_COMMUNITY): Payer: Medicare Other | Admitting: Occupational Therapy

## 2018-06-24 DIAGNOSIS — G9581 Conus medullaris syndrome: Secondary | ICD-10-CM

## 2018-06-24 MED ORDER — DEXAMETHASONE 4 MG PO TABS
4.0000 mg | ORAL_TABLET | Freq: Three times a day (TID) | ORAL | Status: DC
  Administered 2018-06-24 – 2018-06-28 (×12): 4 mg via ORAL
  Filled 2018-06-24 (×12): qty 1

## 2018-06-24 NOTE — Progress Notes (Signed)
Physical Therapy Session Note  Patient Details  Name: Trevor [name] MRN: [id number] Date of Birth: [birth year]  Today's Date: 06/24/2018 PT Individual Time: 1300-1400 PT Individual Time Calculation (min): 60 min   Short Term Goals: Week 1:  PT Short Term Goal 1 (Week 1): Pt will perform bed mobility with min assist  PT Short Term Goal 2 (Week 1): [name] with min assist and LRAD  PT Short Term Goal 3 (Week 1): [name] PT Short Term Goal 4 (Week 1): [name] >2 minutes with min-mod assist PT Short Term Goal 5 (Week 1): [name] with min assist.   Skilled Therapeutic Interventions/Progress Updates:    w/c propulsion using bil UEs on level tile x 150' with extra time, supervision.  Seated neuromuscular re-education via multimodal cues and visual feedback for isolated muscle activation : 2 x 10 Bil heel raises/toe raises, bil hip adduction, resisted bil hip abduction, .  Sit> stand in [place] with max assist.  Reviewed back precautions as they relate to technique for sit>< stand with pt verbalizing understanding.  Therapeutic activity in sitting to reach forward via trunk flexion, to match cards x 9 , with good form with min cues.  Once sitting on high seat of [place], sit<> stand x 9 to match playing cards iwith decreased reliance on bil UEs 4/9 trials.  Reciprocal scooting forward/backward with bil UE use > fading to 0UE use, with mod cues for sufficient wt shifting. In sitting with feet supported, R/L lateral leans x 5 each.  From raised mat, with mod assist, pt elevated hips off of mat x 5.  Pt demonstrates poor loading of bil LEs, with LLE flexing at the hip, and L PF with effort.   Pt left resting in w/c with seat alarm set and all needs within reach.   Therapy Documentation Precautions:  Precautions Precautions: Fall Precaution Comments: spinal precautions: no bending, lifting, twisting,  arching Restrictions Weight Bearing Restrictions: No Other Position/Activity Restrictions: No brace needed per orders  Pain: pt denies       see Function Navigator for Current Functional Status.   Therapy/Group: Individual Therapy  Mariesa Grieder 06/24/2018, 2:43 PM

## 2018-06-24 NOTE — Progress Notes (Signed)
Slept good. Incision to back OTA, slightly red, no drainage. PRN oxy IR 5mg  given at 2228, along with scheduled trazodone. Trevor [name] A

## 2018-06-24 NOTE — Progress Notes (Signed)
[facility] PMR Admission Coordinator Pre-Admission Assessment  Patient: Trevor [name] is an 72 y.o., male MRN: [id number] DOB: [birth year] Height: 5' 8"  (172.7 cm) Weight: 81.6 kg (180 lb)  Insurance Information HMO:     PPO: X     PCP:      IPA:      80/20:      OTHER:  PRIMARY: U[employer] Medicare      Policy#: [id number]      Subscriber: Self CM Name: Vevelyn Royals       Phone#: [phone]     Fax#: [phone] Pre-Cert#: [id number] given 06/22/18 by Izora Gala (phone:[phone]) for 7 days with follow up due to Vevelyn Royals 06/28/18      Employer: Full Time Benefits:  Phone #: [phone]     Name: Verified online at [email or web address] Eff. Date: 12/12/17     Deduct: $300      Out of Pocket Max: $1700      Life Max: N/A CIR: 90%/10%      SNF: 90%/10% for up to $20 per day, days 1-20; 90%/10% for up to $172 a day, days 21-120 Outpatient: Necessity, 80%     Co-Pay: 20% Home Health: Necessity, 100%      Co-Pay: $0 DME: 80%     Co-Pay: 20% Providers: In-network   SECONDARY: None        Emergency Contact Information         Contact Information    Name Relation Home Work [place] Brother [phone]  [phone]   [place], [place] [phone]        Current Medical History  Patient Admitting Diagnosis: POD2s/p L2/3 lumbar microdiscectomy for lumbar radiculopathy.  History of Present Illness: Trevor [name] is a 72 year old male with history of HTN, BPH, hypospadias, bladder cancer, depression/anxiety disorder, DDD with back pain with radiculopathy s/p lumbar decompression L3-S1 and ACDF 11/20/17 and was doing well he started developing recurrent pain with radiculopathy with BLE weakness and falls. 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. Post op course complicated by urinary retention and foley placed by GU on 7/11 with recommendations to keep foley in till 7/15 before removal/voiding trial. He has had difficulty standing  with pain and decadron added for decline due to traction response due to surgery. PT/OT evaluations done yesterday revealing significant decline in mobility as was ability to carry out ADLs. CIR was recommended due to functional decline.  Patient's medical record from [facility] has been reviewed by the rehabilitation admission coordinator and physician.  Past Medical History      Past Medical History:  Diagnosis Date  . Acne   . Allergic rhinitis   . Anemia   . Anxiety   . BPH (benign prostatic hypertrophy) with urinary obstruction   . Cancer [facility])    bladder CA; remission since Oct 2014  . Carpal tunnel syndrome   . Cervicalgia   . Degenerative disc disease, cervical    neck and back;   . Depression    controlled;   Marland Kitchen GERD (gastroesophageal reflux disease)   . Hemorrhoids   . History of malignant neoplasm of bladder   . Hyperlipidemia   . Hypertension    controlled with medication;   . Hypospadias   . Low back pain   . Mitral regurgitation   . Pre-diabetes   . Sleep apnea   . Urinary frequency     Family History   family  history includes Hyperlipidemia in his brother and sister.  Prior Rehab/Hospitalizations Has the patient had major surgery during 100 days prior to admission? No              Current Medications Refer to [facility] from [facility] record   Patients Current Diet:  Heart Healthy Diet Restrictions   Precautions / Restrictions Precautions Precautions: Fall Precautions/Special Needs: Hip/Back Precaution Comments: spinal precautions: no bending, lifting, twisting, arching Restrictions Weight Bearing Restrictions: No Other Position/Activity Restrictions: No brace needed per orders   Has the patient had 2 or more falls or a fall with injury in the past year?Yes  Prior Activity Level Community (5-7x/wk): Prior to the onset of his decline in mid May patient was fully  independnet, living alone, and workign full time.  Since this time patient progressed to using a rolling walker and had moved in with his brother and sister since they had more modifications in their home; he was Mod I at this time.  Prior Functional Level Self Care: Did the patient need help bathing, dressing, using the toilet or eating? Independent  Indoor Mobility: Did the patient need assistance with walking from room to room (with or without device)? Independent  Stairs: Did the patient need assistance with internal or external stairs (with or without device)? Independent  Functional Cognition: Did the patient need help planning regular tasks such as shopping or remembering to take medications? [place] / Equipment Home Assistive Devices/Equipment: Other (Comment), Cane (specify quad or straight), Walker (specify type)(shoe horn; reacher; both; standard)  Prior Device Use: Indicate devices/aids used by the patient prior to current illness, exacerbation or injury? Walker, Rolling walker   Prior Functional Level Current Functional Level  Bed Mobility  Independent   Mod A   Transfers  Independent   Min A-Max A+2   Mobility - Walk/Wheelchair  Independent   TBA   Upper Body Dressing  Independent   Min A   Lower Body Dressing  Independent   Max A   Grooming  Independent   Set-up assist   Eating/Drinking  Independent   Independent    Toilet Transfer  Independent   TBA   Bladder Continence   Independent    Total A with internal foley due to acute urinary retention    Bowel Management  Independent    TBA, none since admission on 06/20/18   Stair Climbing  Independent    TBA   Communication  Independent    Independent    Memory  Independent    Independent    Cooking/Meal Prep  Independent       Housework  Independent     Money Management  Independent     Driving   Independent       Special needs/care consideration BiPAP/CPAP: Patient reports that he has CPAP at home, but did not use it CPM: No Continuous Drip IV: No Dialysis: No        Life Vest: No Oxygen: No Special Bed: No Trach Size: No Wound Vac (area): No       Skin: Monitoring back surgical incision; bruising to right inside of upper arm                                Bowel mgmt: None since admission  Bladder mgmt: Acute urinary retention with internal foley in place  Diabetic mgmt: Reports being pre-diabetic   Previous  Home Environment Living Arrangements: Alone  Lives With: Alone Available Help at Discharge: Family, Available 24 hours/day Type of Home: House Home Layout: One level Home Access: Stairs to enter Entrance Stairs-Rails: Can reach both, Right, Left Entrance Stairs-Number of Steps: 4, landing, then threshold  Bathroom Shower/Tub: Tub/shower unit, Architectural technologist: Standard Bathroom Accessibility: Yes How Accessible: Accessible via walker [place]: No Additional Comments: If he is not Mod I upon dischrage then he can go to his brother's house for assist  Discharge Living Setting Plans for Discharge Living Setting: Other (Comment)(If not Mod I then his brother's house ) Type of Home at Discharge: House Discharge Home Layout: One level Discharge Home Access: Stairs to enter Entrance Stairs-Rails: Right, Left, Can reach both Entrance Stairs-Number of Steps: 4 Discharge Bathroom Shower/Tub: Walk-in shower Discharge Bathroom Toilet: Standard Discharge Bathroom Accessibility: Yes How Accessible: Accessible via walker Does the patient have any problems obtaining your medications?: No  Social/Family/Support Systems Patient Roles: Other (Comment)(Family member, brother ) Contact Information: Brother and sister-in-law: [place] and Aggie Anticipated Caregiver: Sister-in-law Aggie Anticipated Caregiver's Contact Information: Cell:  [phone] Ability/Limitations of Caregiver: None anticipated at this time Caregiver Availability: 24/7 Discharge Plan Discussed with Primary Caregiver: Yes Is Caregiver In Agreement with Plan?: Yes Does Caregiver/Family have Issues with Lodging/Transportation while Pt is in Rehab?: No  Goals/Additional Needs Patient/Family Goal for Rehab: PT/OT: Mod I-Supervision Expected length of stay: 11-16 days Cultural Considerations: [employer]  Dietary Needs: Heart Healthy diet restrictions  Equipment Needs: TBD Pt/Family Agrees to Admission and willing to participate: Yes Program Orientation Provided & Reviewed with Pt/Caregiver Including Roles  & Responsibilities: Yes  Patient Condition: I have met with the patient at bedside and reviewed his medical record from [facility].  He was independent, active, and working following his back surgery in December up until May when he started to decline.  Since this time he has had to move in with his brother and sister-in-law and is unable to work a full day.  Post op he has had more of a functional decline, continued pain, and acute urinary retention and requires the coordinated medical and rehabilitation that CIR offers in order to maximize his functional independence and return home.    Preadmission Screen Completed By:  Gunnar Fusi, 06/22/2018 10:24 AM ______________________________________________________________________   Discussed status with Dr. Naaman Plummer on 06/22/18 at 3:00 PM and received telephone approval for admission today.  Admission Coordinator:  Gunnar Fusi, time 3:00 /Date 06/22/18   Assessment/Plan: Diagnosis: lumbar radiculopathy, HNP 1. Does the need for close, 24 hr/day  Medical supervision in concert with the patient's rehab needs make it unreasonable for this patient to be served in a less intensive setting? Yes 2. Co-Morbidities requiring supervision/potential complications: HTN, BPH, bladder/anxiety,  3. Due to bladder management,  bowel management, safety, skin/wound care, disease management, medication administration, pain management and patient education, does the patient require 24 hr/day rehab nursing? Yes 4. Does the patient require coordinated care of a physician, rehab nurse, PT (1-2 hrs/day, 5 days/week) and OT (1-2 hrs/day, 5 days/week) to address physical and functional deficits in the context of the above medical diagnosis(es)? Yes Addressing deficits in the following areas: balance, endurance, locomotion, strength, transferring, bowel/bladder control, bathing, dressing, feeding, grooming and psychosocial support 5. Can the patient actively participate in an intensive therapy program of at least 3 hrs of therapy 5 days a week? Yes 6. The potential for patient to make measurable gains while on inpatient rehab is excellent 7. Anticipated functional outcomes  upon discharge from inpatients are: modified independent and supervision PT, modified independent and supervision OT, n/a SLP 8. Estimated rehab length of stay to reach the above functional goals is: 11-16 days 9. Does the patient have adequate social supports to accommodate these discharge functional goals? Yes 10. Anticipated D/C setting: Home 11. Anticipated post D/C treatments: HH therapy and Outpatient therapy 12. Overall Rehab/Functional Prognosis: excellent    RECOMMENDATIONS: This patient's condition is appropriate for continued rehabilitative care in the following setting: CIR Patient has agreed to participate in recommended program. Yes Note that insurance prior authorization may be required for reimbursement for recommended care.  Comment: admit to inpatient rehab today  Meredith Staggers, MD, [facility] 06/22/2018    Gunnar Fusi 06/22/2018          Revision History

## 2018-06-24 NOTE — Progress Notes (Signed)
Foley removed at 0900 for voiding trial.

## 2018-06-24 NOTE — Progress Notes (Signed)
[place] PHYSICAL MEDICINE & REHABILITATION     PROGRESS NOTE    Subjective/Complaints: Sore from therapies yesterday but feels that he did fairly well.  Able to sleep last night.  ROS: Patient denies fever, rash, sore throat, blurred vision, nausea, vomiting, diarrhea, cough, shortness of breath or chest pain, joint or back pain, headache, or mood change.   Objective:  No results found. Recent Labs    06/23/18 0526  WBC 12.3*  HGB 11.4*  HCT 34.7*  PLT 126*   Recent Labs    06/23/18 0526  NA 136  K 4.2  CL 100  GLUCOSE 131*  BUN 31*  CREATININE 0.99  CALCIUM 9.1   CBG (last 3)  No results for input(s): GLUCAP in the last 72 hours.  Wt Readings from Last 3 Encounters:  06/22/18 82.7 kg (182 lb 4.8 oz)  06/22/18 81.6 kg (180 lb)  06/18/18 81.6 kg (180 lb)     Intake/Output Summary (Last 24 hours) at 06/24/2018 0807 Last data filed at 06/24/2018 0500 Gross per 24 hour  Intake 1070 ml  Output 1600 ml  Net -530 ml    Vital Signs: Blood pressure 132/77, pulse (!) 56, temperature 98.6 F (37 C), temperature source Oral, resp. rate (!) 21, height 5\' 8"  (1.727 m), weight 82.7 kg (182 lb 4.8 oz), SpO2 97 %. Physical Exam:  Constitutional: No distress . Vital signs reviewed. HEENT: EOMI, oral membranes moist Neck: supple Cardiovascular: RRR without murmur. No JVD    Respiratory: CTA Bilaterally without wheezes or rales. Normal effort    GI: BS +, non-tender, non-distended   Musculoskeletal: He exhibits noedemaor tenderness. Healing abrasion left shin. Neurological: Speech clear. Able to follow basic commands without difficulty.UE 5/5. HAB 3+ to 4/5. ADF 4-/5. APF 4+/5. HF 2- 3/5. KE 2-3/5. No consistent sensory findings for LT/pain Skin:No rashnoted.  Surgical incision CDI Psych: pleasant and appropriate    Assessment/Plan: 1. 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 Physiatrist is providing close team supervision and 24 hour management of active medical problems listed below. Physiatrist and rehab team continue to assess barriers to discharge/monitor patient progress toward functional and medical goals.  Function:  Bathing Bathing position   Position: Wheelchair/chair at sink  Bathing parts Body parts bathed by patient: Right arm, Left arm, Chest, Abdomen, Front perineal area, Right upper leg, Left upper leg, Right lower leg, Left lower leg Body parts bathed by helper: Buttocks, Back  Bathing assist Assist Level: (Mod A sit<stand in [place] for pericare)      Upper Body Dressing/Undressing Upper body dressing   What is the patient wearing?: Pull over shirt/dress     Pull over shirt/dress - Perfomed by patient: Thread/unthread right sleeve, Thread/unthread left sleeve, Put head through opening, Pull shirt over trunk          Upper body assist Assist Level: Set up      Lower Body Dressing/Undressing Lower body dressing   What is the patient wearing?: Pants, Non-skid slipper socks       Pants- Performed by helper: Thread/unthread right pants leg, Thread/unthread left pants leg, Pull pants up/down   Non-skid slipper socks- Performed by helper: Don/doff right sock, Don/doff left sock                  Lower body assist Assist for lower body dressing: (Mod A sit<stand in [place])  Toileting Toileting Toileting activity did not occur: No continent bowel/bladder event   Toileting steps completed by helper: Adjust clothing prior to toileting, Adjust clothing after toileting, Performs perineal hygiene    Toileting assist     Transfers Chair/bed transfer   Chair/bed transfer method: Squat pivot Chair/bed transfer assist level: Maximal assist (Pt 25 - 49%/lift and lower) Chair/bed transfer assistive device: Armrests     Locomotion Ambulation Ambulation activity did not occur:  Safety/medical concerns         Wheelchair   Type: Manual Max wheelchair distance: 180ft  Assist Level: Touching or steadying assistance (Pt > 75%)  Cognition Comprehension Comprehension assist level: Understands basic 90% of the time/cues < 10% of the time  Expression Expression assist level: Expresses basic 75 - 89% of the time/requires cueing 10 - 24% of the time. Needs helper to occlude trach/needs to repeat words.  Social Interaction Social Interaction assist level: Interacts appropriately 90% of the time - Needs monitoring or encouragement for participation or interaction.  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 He has multiple level muscle weakness from his hip flexors distally with associated neurogenic bowel and bladder (bladder more affected) -continue therapies 2. DVT Prophylaxis/Anticoagulation: Mechanical:Sequential compression devices, below kneeBilateral lower extremities 3. Pain Management:On celebrex bid with decadron every 6 hours--           -appears controlled at present 4. Mood:team to provide ego support. LCSW to follow for evaluation and support. 5. Neuropsych: This patientiscapable of making decisions on hisown behalf. 6. Skin/Wound Care:monitor wound for healing. Add protein supplement to promote wound healing. 7. Fluids/Electrolytes/Nutrition:Monitor I/O. Check post op labs.  8. Urinary retention/BPH:H/o hypospadias.  -Remove Foley today and begin voiding trial.  Use coud tip catheter for catheterizations 9. HTN: Monitor BP bid--continue HCTZ and lisinopril.  10. Prediabetes: CM  diet. 11. GERD: Added PPI due to h/o of gastritis and now with celebrex and decadron on board.    LOS (Days) [place] EVALUATION WAS PERFORMED  Meredith Staggers, MD 06/24/2018 8:07 AM

## 2018-06-25 ENCOUNTER — Inpatient Hospital Stay (HOSPITAL_COMMUNITY): Payer: Medicare Other

## 2018-06-25 ENCOUNTER — Ambulatory Visit: Payer: Medicare Other

## 2018-06-25 LAB — BASIC METABOLIC PANEL
Anion gap: 10 (ref 5–15)
BUN: 43 mg/dL — AB (ref 8–23)
CO2: 27 mmol/L (ref 22–32)
CREATININE: 1.12 mg/dL (ref 0.61–1.24)
Calcium: 9.1 mg/dL (ref 8.9–10.3)
Chloride: 98 mmol/L (ref 98–111)
Glucose, Bld: 184 mg/dL — ABNORMAL HIGH (ref 70–99)
POTASSIUM: 4.8 mmol/L (ref 3.5–5.1)
SODIUM: 135 mmol/L (ref 135–145)

## 2018-06-25 NOTE — Progress Notes (Signed)
Physical Therapy Session Note  Patient Details  Name: Trevor [name] MRN: [id number] Date of Birth: December 19, 1945  Today's Date: 06/25/2018 PT Individual Time: 1000-1115 PT Individual Time Calculation (min): 75 min   Short Term Goals: Week 1:  PT Short Term Goal 1 (Week 1): Pt will perform bed mobility with min assist  PT Short Term Goal 2 (Week 1): [name] with min assist and LRAD  PT Short Term Goal 3 (Week 1): [name] PT Short Term Goal 4 (Week 1): [name] >2 minutes with min-mod assist PT Short Term Goal 5 (Week 1): [name] with min assist.   Skilled Therapeutic Interventions/Progress Updates:    Pt able to come to EOB with use of bedrails with supervision and cues for back precautions. PT assisted with donning of shoes to prepare for OOB. D/c planning discussed as pt reports even going home with brother, he needs to be as independent as possible due to sister in law is primary caregiver for brother (has had h/o of CVA and requires hands on assist). Michaelyn Barter for transfer to w/c with mod assist for initial sit <> stand with heavy reliance on UE's, cues for upright posture and head to aid with trunk extension. Min assist slideboard transfer <> Nustep with cues for technique. Nustep x 8 min on level 4 with BUE and BLE for reciprocal movement pattern retraining and functional strengthening/muscular endurance with focus on decreasing reliance on UE's. Performed min assist slideboard transfers to mat with cues for sequencing and techniques - assist for board placement needed. Donned maxi sky sling and using RW, neuro re-ed for sit <> stand re-training for correct technique (using UE to push up instead of attempting to pull with RW and cues for anterior weightshift) requiring max assist for transitional movements. Pt able to gait x 2 trials of 10' each time with maxi sky supporting body weight - pt advancing  LE's independently with cues for sequencing and placement of RW during gait trials. When LLE buckles or attempting to return to seated position, pt requires total assist for eccentric control with sling for support. Focused on pt attempting to reach back with 1 UE support to allow for more controlled descent. May benefit from lite gait sling style better due to more support at hips/pelvis vs overhead at neck. PT changed w/c to more narrow w/c for improved biomechanical efficiency of propulsion and added J2 cushion and basic back for improved sitting tolerance.  Therapy Documentation Precautions:  Precautions Precautions: Fall Precaution Comments: spinal precautions: no bending, lifting, twisting, arching Restrictions Weight Bearing Restrictions: No Other Position/Activity Restrictions: No brace needed per orders  Pain: Denies pain.  See Function Navigator for Current Functional Status.   Therapy/Group: Individual Therapy  Canary Brim Ivory Broad, PT, DPT  06/25/2018, 11:19 AM

## 2018-06-25 NOTE — Progress Notes (Signed)
Social Work  Social Work Assessment and Plan  Patient Details  Name: Trevor [name] MRN: [id number] Date of Birth: 12-Oct-1946  Today's Date: 06/25/2018  Problem List:  Patient Active Problem List   Diagnosis Date Noted  . Herniation of lumbar intervertebral disc with radiculopathy 06/22/2018  . Lumbar radiculopathy 06/20/2018  . Nocturia 10/23/2015  . Absolute anemia 05/13/2014  . Benign essential HTN 05/13/2014  . Blood glucose elevated 05/13/2014  . LBP (low back pain) 05/13/2014  . HLD (hyperlipidemia) 05/13/2014  . Malignant neoplasm of lateral wall of urinary bladder ([place]) 09/19/2013  . Benign prostatic hypertrophy without urinary obstruction 12/10/2012  . Hypospadias 12/10/2012  . Microscopic hematuria 12/10/2012  . History of neoplasm of bladder 12/10/2012  . FOM (frequency of micturition) 12/10/2012   Past Medical History:  Past Medical History:  Diagnosis Date  . Acne   . Allergic rhinitis   . Anemia   . Anxiety   . BPH (benign prostatic hypertrophy) with urinary obstruction   . Cancer [facility])    bladder CA; remission since Oct 2014  . Carpal tunnel syndrome   . Cervicalgia   . Degenerative disc disease, cervical    neck and back;   . Depression    controlled;   Marland Kitchen Gastritis   . GERD (gastroesophageal reflux disease)   . Hemorrhoids   . History of malignant neoplasm of bladder   . Hyperlipidemia   . Hypertension    controlled with medication;   . Hypospadias   . Low back pain   . Mitral regurgitation   . Pre-diabetes   . Sleep apnea   . Urinary frequency    Past Surgical History:  Past Surgical History:  Procedure Laterality Date  . BLADDER SURGERY     2011, 2013, 2015  . COLONOSCOPY WITH PROPOFOL N/A 07/31/2015   Procedure: COLONOSCOPY WITH PROPOFOL;  Surgeon: Manya Silvas, MD;  Location: [facility] ENDOSCOPY;  Service: Endoscopy;  Laterality: N/A;  . COLONOSCOPY, ESOPHAGOGASTRODUODENOSCOPY (EGD) AND ESOPHAGEAL DILATION    . FLEXIBLE SIGMOIDOSCOPY     . LUMBAR LAMINECTOMY/DECOMPRESSION MICRODISCECTOMY N/A 06/20/2018   Procedure: LUMBAR LAMINECTOMY/DECOMPRESSION MICRODISCECTOMY 1 LEVEL-L-2-3;  Surgeon: Meade Maw, MD;  Location: [facility] ORS;  Service: Neurosurgery;  Laterality: N/A;  . SEPTOPLASTY    . TONSILLECTOMY     Social History:  reports that he has never smoked. He has never used smokeless tobacco. He reports that he does not drink alcohol or use drugs.  Family / Support Systems Marital Status: (P) Single Patient Roles: Other (Comment)(brother) Other Supports: (P) brother, [name]) @ (H) [phone] or (C) [phone];  brother, [name] ([place]) @ (C) [phone];  sister, [name] [facility] Anticipated Caregiver: Sister-in-law Aggie Ability/Limitations of Caregiver: Pt notes that his sister-in-law is "already taking care of my brother (Reggie)" and is reluctant to place additional burden on her. Caregiver Availability: 24/7 Family Dynamics: (P) Pt describes his family all as very supportive. Notes he had been staying with brother and sister-in-law PTA due to better home accessibility.  As noted, he appreciates sister-in-law's willingness to assist but reluctant to put additional burden.  Social History Preferred language: English Religion: Baptist Cultural Background: (P) NA Education: (P) HS Read: (P) Yes Write: (P) Yes Employment Status: Employed Name of Employer: [employer] Length of Employment: 8(+ yrs) Return to Work Plans: Pt hopeful he can return to his job.  Currently received STD. Legal Hisotry/Current Legal Issues: None Guardian/Conservator: None - per MD, pt is capable of making decisions on  his own behalf.   Abuse/Neglect Abuse/Neglect Assessment Can Be Completed: (P) Yes Physical Abuse: Denies Verbal Abuse: Denies Sexual Abuse: Denies Exploitation of patient/patient's resources: Denies Self-Neglect: Denies  Emotional Status Pt's affect, behavior adn adjustment  status: Pt pleasant and motivated for therapies today.  Admits frustration with limitations and notes "they're worse than last time (prior surgery)".  Denies any significant emotional distress - will monitor. Recent Psychosocial Issues: Declinine function since May. Pyschiatric History: Pt reports that he "went through a rough time when my mother died 03/24/10)... 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   Substance Abuse History: see above  Patient / Family Perceptions, Expectations & Goals Pt/Family understanding of illness & functional limitations: Pt and family with good understanding of surgery performed and resulting functional limitations/ need for CIR. Premorbid pt/family roles/activities: Was using walker just PTA but overall independent. Anticipated changes in roles/activities/participation: Per goals of Mod ind w/c - mod assist, sister-in-law may need to provide more physical assistance upon d/c. Pt/family expectations/goals: "I just want to get as independent as I can and go back to my own home if I can."  [employer]: None Premorbid Home Care/DME Agencies: Other (Comment)(SNF at [facility] after prior back surgery) Transportation available at discharge: yes  Discharge Planning Living Arrangements: Alone Support Systems: Other relatives Type of Residence: Private residence Insurance Resources: [employer] Financial Resources: Social Security, Employment Financial Screen Referred: No Living Expenses: Own Money Management: Patient Does the patient have any problems obtaining your medications?: No Home Management: Pt  Patient/Family Preliminary Plans: Pt hopes to be able to return to his home.  "Back up" plan is to return to brother's home. Social Work Anticipated Follow Up Needs: HH/OP Expected length of stay: 18-21 days  Clinical Impression Pleasant  gentleman here following lumbar surgery and with significant LE limitations.  Motivated for CIR therapies and hopeful he can regain enough independence to return to his own home.  Notes he does have option to stay with brother and sister-in-law if more assist needed.  Pt admits frustration with limitations, however, denies any significant emotional distress.  Will follow for support and d/c planning needs.  Sutton Hirsch 06/25/2018, 11:06 AM

## 2018-06-25 NOTE — IPOC Note (Signed)
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  Hospital Problems: Active Problems:   Herniation of lumbar intervertebral disc with radiculopathy     Functional Problem List: Nursing Bladder, Bowel, Edema, Endurance, Pain, Safety  PT Balance, Edema, Endurance, Motor, Pain, Safety, Skin Integrity  OT Balance, Safety, Endurance, Motor  SLP    TR         Basic ADL's: OT Grooming, Bathing, Dressing, Toileting     Advanced  ADL's: OT Simple Meal Preparation     Transfers: PT Bed Mobility, Bed to Chair, Car, Furniture, Floor  OT Toilet, Tub/Shower     Locomotion: PT Ambulation, Emergency planning/management officer, Stairs     Additional Impairments: OT None  SLP        TR      Anticipated Outcomes Item Anticipated Outcome  Self Feeding No goal  Swallowing      Basic self-care  Supervision-Mod I   Toileting  Mod I    Bathroom Transfers Mod I   Bowel/Bladder  Mod I with bowel and bladder continence  Transfers  Mod I with LRAD   Locomotion  Mod I at [facility] level. Moderate assist ambulation with LRAD for short distances.   Communication     Cognition     Pain  <3 on a 0-10 pain scale  Safety/Judgment  mod assist with transfers and safety    Therapy Plan: PT Intensity: Minimum of 1-2 x/day ,45 to 90 minutes PT Frequency: 5 out of 7 days PT Duration Estimated Length of Stay: 12-15 days  OT Intensity: Minimum of 1-2 x/day, 45 to 90 minutes OT Duration/Estimated Length of Stay: 14-16 days      Team Interventions: Nursing Interventions Patient/Family Education, Pain Management, Bladder Management, Bowel Management, Medication Management, Discharge Planning, Disease Management/Prevention, Psychosocial Support  PT interventions Ambulation/gait training, Training and development officer, Community reintegration, Discharge planning, Disease  management/prevention, DME/adaptive equipment instruction, Functional electrical stimulation, Functional mobility training, Neuromuscular re-education, Pain management, Patient/family education, Psychosocial support, Skin care/wound management, Splinting/orthotics, Therapeutic Activities, Stair training, UE/LE Coordination activities, Visual/perceptual remediation/compensation, Wheelchair propulsion/positioning, UE/LE Strength taining/ROM, Therapeutic Exercise  OT Interventions Training and development officer, Community reintegration, Disease mangement/prevention, Functional electrical stimulation, Neuromuscular re-education, Patient/family education, Self Care/advanced ADL retraining, Splinting/orthotics, Therapeutic Exercise, UE/LE Coordination activities, Wheelchair propulsion/positioning, Visual/perceptual remediation/compensation, UE/LE Strength taining/ROM, Therapeutic Activities, Skin care/wound managment, Psychosocial support, Pain management, Functional mobility training, Cognitive remediation/compensation, Discharge planning, DME/adaptive equipment instruction  SLP Interventions    TR Interventions    SW/CM Interventions Discharge Planning, Psychosocial Support, Patient/Family Education   Barriers to Discharge MD  Medical stability  Nursing Decreased caregiver support, Incontinence, Neurogenic Bowel & Bladder, Lack of/limited family support, Home environment access/layout    PT Inaccessible home environment, Decreased caregiver support, Home environment access/layout    OT Lack of/limited family support, Medical stability    SLP      SW       Team Discharge Planning: Destination: PT-Home ,OT- Home , SLP-  Projected Follow-up: PT-Home health PT, OT-  Home health OT, SLP-  Projected Equipment Needs: PT-Wheelchair cushion (measurements), Wheelchair (measurements), Rolling walker with 5" wheels, Sliding board, OT- To be determined, SLP-  Equipment Details: PT- , OT-  Patient/family involved  in discharge planning: PT- Patient,  OT-Patient, SLP-   MD ELOS: 15-17 days Medical Rehab Prognosis:  Excellent Assessment: The patient has been admitted for CIR therapies with the diagnosis of lumbar HNP, conus medullaris syndrome. The  team will be addressing functional mobility, strength, stamina, balance, safety, adaptive techniques and equipment, self-care, bowel and bladder mgt, patient and caregiver education, NMR, orthotics, pain control, post-op precautions, community reentry. Goals have been set at mod I for mobility and self-care.    Meredith Staggers, MD, FAAPMR      See Team Conference Notes for weekly updates to the plan of care

## 2018-06-25 NOTE — Progress Notes (Signed)
Dulcolax supp. Given this AM per patient's request. No stool felt in rectum. Trevor [name] A

## 2018-06-25 NOTE — Progress Notes (Signed)
PRN Oxy IR 5mg ,  given along with scheduled meds at HS. Up to BR x 2, attempting to have BM. LBM 07/13, requesting PRN supp. this AM at 0630.Patrici Ranks A

## 2018-06-25 NOTE — Progress Notes (Signed)
Physical Therapy Session Note  Patient Details  Name: Trevor [name] MRN: [id number] Date of Birth: May 30, 1946  Today's Date: 06/25/2018 PT Individual Time: 1330-1430 PT Individual Time Calculation (min): 60 min   Short Term Goals: Week 1:  PT Short Term Goal 1 (Week 1): Pt will perform bed mobility with min assist  PT Short Term Goal 2 (Week 1): [name] with min assist and LRAD  PT Short Term Goal 3 (Week 1): [name] PT Short Term Goal 4 (Week 1): [name] >2 minutes with min-mod assist PT Short Term Goal 5 (Week 1): [name] with min assist.   Skilled Therapeutic Interventions/Progress Updates:    Pt stated that he was very tired, from gait training in harness with RW,  this AM.  W/c propulsion x 300' on [address]. [place], up/down 3% ramp with supervision for techniques.  Pt propelled in congested home-like setting of lobby, bumping 3 areas in a 15' tight circle, approximately.  Reviewed diaphragmatic breathing when experiencing muscle fatigue or anxiety.    neuromuscular re-education via forced use, multimodal cues for alternating reciprocal movement x bil LEs, seated in w/c at [facility] at 40 cm/sec resistance, x 25cycles x 2 focusing on quadriceps muscles, at 60 cm/sec x 20 cycles x 1 focusing on gluteal muscles. Supine 10 x 1 bil lower trunk rotation with Theraband around thighs for eccentric control, 10 x 2 bil bridging with assistance for L pelvic elevation, improving with practice.  Seated 10 x 1 bil scapular adduction to open chest and strengthen upper back.   Pt stated he was exhausted and asked to get back to bed. Slide board transfer to L to return to bed.  PT doffed pt's shoes as he sat EOB. Pt donned non slip socks in sitting with supervision.    Sit> supine with supervision/  Min assist to straighten LEs.   Pt left resting in bed with needs at hand, and alarm set.   Therapy  Documentation Precautions:  Precautions Precautions: Fall Precaution Comments: spinal precautions: no bending, lifting, twisting, arching Restrictions Weight Bearing Restrictions: No Other Position/Activity Restrictions: No brace needed per orders  Pain: Pain Assessment 0/10    See Function Navigator for Current Functional Status.   Therapy/Group: Individual Therapy  Zak Gondek 06/25/2018, 3:27 PM

## 2018-06-25 NOTE — Progress Notes (Signed)
Patient information reviewed and entered into eRehab system by Kyleah Pensabene, RN, CRRN, PPS Coordinator.  Information including medical coding and functional independence measure will be reviewed and updated through discharge.     Per nursing patient was given "Data Collection Information Summary for Patients in Inpatient Rehabilitation Facilities with attached "Privacy Act Statement-Health Care Records" upon admission.  

## 2018-06-25 NOTE — Care Management (Signed)
[facility] Individual Statement of Services  Patient Name:  Trevor [name]  Date:  06/25/2018  Welcome to the [facility].  Our goal is to provide you with an individualized program based on your diagnosis and situation, designed to meet your specific needs.  With this comprehensive rehabilitation program, you will be expected to participate in at least 3 hours of rehabilitation therapies Monday-Friday, with modified therapy programming on the weekends.  Your rehabilitation program will include the following services:  Physical Therapy (PT), Occupational Therapy (OT), 24 hour per day rehabilitation nursing, Therapeutic Recreaction (TR), Case Management (Social Worker), Rehabilitation Medicine, Nutrition Services and Pharmacy Services  Weekly team conferences will be held on Tuesdays to discuss your progress.  Your Social Worker will talk with you frequently to get your input and to update you on team discussions.  Team conferences with you and your family in attendance may also be held.  Expected length of stay: 14-16 days   Overall anticipated outcome: modified independent/ supervision  Depending on your progress and recovery, your program may change. Your Social Worker will coordinate services and will keep you informed of any changes. Your Social Worker's name and contact numbers are listed  below.  The following services may also be recommended but are not provided by the [facility] will be made to provide these services after discharge if needed.  Arrangements include referral to agencies that provide these services.  Your insurance has been verified to be:  [facility] Medicare Your primary doctor is:  Pharmacist, community  Pertinent information will be shared with your doctor and your insurance  company.  Social Worker:  [place], [place] or ([phone]   Information discussed with and copy given to patient by: Lennart Pall, 06/25/2018, 9:45 AM

## 2018-06-25 NOTE — Progress Notes (Signed)
Trevor [name] PHYSICAL MEDICINE & REHABILITATION     PROGRESS NOTE    Subjective/Complaints: Up with OT. No new issues this morning.  Appears to have emptied bladder without needing cath. constipated  ROS: Patient denies fever, rash, sore throat, blurred vision, nausea, vomiting, diarrhea, cough, shortness of breath or chest pain, joint or back pain, headache, or mood change.   Objective:  No results found. Recent Labs    06/23/18 0526  WBC 12.3*  HGB 11.4*  HCT 34.7*  PLT 126*   Recent Labs    06/23/18 0526  NA 136  K 4.2  CL 100  GLUCOSE 131*  BUN 31*  CREATININE 0.99  CALCIUM 9.1   CBG (last 3)  No results for input(s): GLUCAP in the last 72 hours.  Wt Readings from Last 3 Encounters:  06/22/18 82.7 kg (182 lb 4.8 oz)  06/22/18 81.6 kg (180 lb)  06/18/18 81.6 kg (180 lb)     Intake/Output Summary (Last 24 hours) at 06/25/2018 0846 Last data filed at 06/24/2018 2200 Gross per 24 hour  Intake 680 ml  Output 100 ml  Net 580 ml    Vital Signs: Blood pressure 130/70, pulse 62, temperature 98 F (36.7 C), resp. rate 16, height 5\' 8"  (1.727 m), weight 82.7 kg (182 lb 4.8 oz), SpO2 98 %. Physical Exam:  Constitutional: No distress . Vital signs reviewed. HEENT: EOMI, oral membranes moist Neck: supple Cardiovascular: RRR without murmur. No JVD    Respiratory: CTA Bilaterally without wheezes or rales. Normal effort    GI: BS +, non-tender, non-distended  Musculoskeletal: He exhibits noedemaor tenderness. Left shin abrasion Neurological: Speech clear. Able to follow basic commands without difficulty.UE 5/5. HAB 3+ to 4/5. ADF 4-/5. APF 4+/5. HF 2- 3/5. KE 2-3/5. No consistent sensory findings for LT/pain although some proprioceptive loss Skin:No rashnoted.  Surgical incision remains CDI Psych: pleasant and appropriate    Assessment/Plan: 1. 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 Physiatrist is providing close team supervision and 24 hour management of active medical problems listed below. Physiatrist and rehab team continue to assess barriers to discharge/monitor patient progress toward functional and medical goals.  Function:  Bathing Bathing position   Position: Shower  Bathing parts Body parts bathed by patient: Right arm, Left arm, Chest, Abdomen, Front perineal area, Right upper leg, Left upper leg, Right lower leg, Left lower leg, Buttocks Body parts bathed by helper: Buttocks, Back  Bathing assist Assist Level: Supervision or verbal cues(set up, from seated level)      Upper Body Dressing/Undressing Upper body dressing   What is the patient wearing?: Pull over shirt/dress     Pull over shirt/dress - Perfomed by patient: Thread/unthread right sleeve, Thread/unthread left sleeve, Put head through opening, Pull shirt over trunk          Upper body assist Assist Level: Set up      Lower Body Dressing/Undressing Lower body dressing   What is the patient wearing?: Pants, Non-skid slipper socks, Ted Hose     Pants- Performed by patient: Pull pants up/down Pants- Performed by helper: Thread/unthread right pants leg, Thread/unthread left pants leg Non-skid slipper socks- Performed by patient: Don/doff right sock, Don/doff left sock Non-skid slipper socks- Performed by helper: Don/doff right sock, Don/doff left sock               TED Hose - Performed by helper: Don/doff  right TED hose, Don/doff left TED hose  Lower body assist Assist for lower body dressing: Touching or steadying assistance (Pt > 75%)      Toileting Toileting Toileting activity did not occur: No continent bowel/bladder event   Toileting steps completed by helper: Adjust clothing prior to toileting, Adjust clothing after toileting, Performs perineal hygiene    Toileting assist     Transfers Chair/bed transfer   Chair/bed  transfer method: Lateral scoot Chair/bed transfer assist level: Touching or steadying assistance (Pt > 75%) Chair/bed transfer assistive device: Sliding board     Locomotion Ambulation Ambulation activity did not occur: Safety/medical concerns         Wheelchair   Type: Manual Max wheelchair distance: 112ft  Assist Level: Supervision or verbal cues  Cognition Comprehension Comprehension assist level: Understands basic 90% of the time/cues < 10% of the time  Expression Expression assist level: Expresses basic 75 - 89% of the time/requires cueing 10 - 24% of the time. Needs helper to occlude trach/needs to repeat words.  Social Interaction Social Interaction assist level: Interacts appropriately 90% of the time - Needs monitoring or encouragement for participation or interaction.  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 Pt with conus medullaris syndrome -continue therapies 2. DVT Prophylaxis/Anticoagulation: Mechanical:Sequential compression devices, below kneeBilateral lower extremities 3. Pain Management:On celebrex bid with decadron every 8 hours--           -appears controlled at present 4. Mood:team to provide ego support. LCSW to follow for evaluation and support. 5. Neuropsych: This patientiscapable of making decisions on hisown behalf. 6. Skin/Wound Care:monitor wound for healing. Add protein supplement to promote wound healing. 7. Fluids/Electrolytes/Nutrition:BUN 31  -push fluids  -I personally reviewed the patient's labs today.   8. Urinary retention/BPH:H/o hypospadias.  -continue voiding trial 9. HTN: Monitor BP bid--continue HCTZ and lisinopril.  10. Prediabetes: CM diet. 11. GERD: Added  PPI due to h/o of gastritis and now with celebrex and decadron on board. 12. Leukocytosis; Likely steroid induced  -monitor  -tapering steroids    LOS (Days) 3 A FACE TO FACE EVALUATION WAS PERFORMED  Meredith Staggers, MD 06/25/2018 8:46 AM

## 2018-06-25 NOTE — Progress Notes (Signed)
Occupational Therapy Session Note  Patient Details  Name: [name] MRN: [id number] Date of Birth: 11-15-1946  Today's Date: 06/25/2018 OT Individual Time: 7096-2836 OT Individual Time Calculation (min): 59 min    Short Term Goals: Week 1:  OT Short Term Goal 1 (Week 1): Pt will complete 1/3 components of donning pants with AE as needed OT Short Term Goal 2 (Week 1): Pt will complete toileting with LRAD and 1 helper OT Short Term Goal 3 (Week 1): Pt will complete 1 grooming task in supported standing   Skilled Therapeutic Interventions/Progress Updates:    Pt received supine in bed agreeable to therapy and c/o pain as described below. Session focused on b/d tasks, ADL transfers, and functional activity tolerance. Vc only for technique provided during transition to EOB from supine. Pt completed sit <> stand in stedy with CGA throughout session. Set up and vc for technique provided during seated level UB/LB bathing from shower level. Pt edu re use of DME/AD to assist with ADLs at home. Pt attempted to stand at sink and was unable to clear hips from w/c. Pt able to push up from w/c and lift hips enough for OT to pull up pants. Pt edu and verbalized back precautions. Pt able to perform figure 4 technique to don socks B. Pt completed 200 ft of w/c propulsion with 2 rest breaks d/t fatigue, in order to increase functional endurance and B UE strength. Pt requested to return to room urgently d/t BM. Pt left sitting on BSC over toilet, RN alerted to pt position and pt verbalized understanding of pulling call bell before standing up.   Therapy Documentation Precautions:  Precautions Precautions: Fall Precaution Comments: spinal precautions: no bending, lifting, twisting, arching Restrictions Weight Bearing Restrictions: No Other Position/Activity Restrictions: No brace needed per orders   Vital Signs: Therapy Vitals Temp: 98 F (36.7 C) Pulse Rate: 62 Resp: 16 BP: 130/70 Patient Position  (if appropriate): Lying Oxygen Therapy SpO2: 98 % O2 Device: Room Air Pain: Pain Assessment Pain Scale: 0-10 Pain Score: 4  Pain Location: Back Pain Orientation: Lower;Medial Pain Descriptors / Indicators: Aching;Sore Pain Onset: On-going Patients Stated Pain Goal: 2 Pain Intervention(s): Medication (See eMAR);Repositioned ADL: ADL ADL Comments: Please see functional navigator for ADL status  See Function Navigator for Current Functional Status.   Therapy/Group: Individual Therapy  Curtis Sites 06/25/2018, 9:02 AM

## 2018-06-26 ENCOUNTER — Inpatient Hospital Stay (HOSPITAL_COMMUNITY): Payer: Medicare Other | Admitting: Occupational Therapy

## 2018-06-26 ENCOUNTER — Inpatient Hospital Stay (HOSPITAL_COMMUNITY): Payer: Medicare Other

## 2018-06-26 ENCOUNTER — Inpatient Hospital Stay (HOSPITAL_COMMUNITY): Payer: Medicare Other | Admitting: Physical Therapy

## 2018-06-26 NOTE — Progress Notes (Signed)
Occupational Therapy Session Note  Patient Details  Name: [name] MRN: [id number] Date of Birth: March 30, 1946  Today's Date: 06/26/2018 OT Individual Time: 4656-8127 OT Individual Time Calculation (min): 60 min    Short Term Goals: Week 1:  OT Short Term Goal 1 (Week 1): Pt will complete 1/3 components of donning pants with AE as needed OT Short Term Goal 2 (Week 1): Pt will complete toileting with LRAD and 1 helper OT Short Term Goal 3 (Week 1): Pt will complete 1 grooming task in supported standing   Skilled Therapeutic Interventions/Progress Updates:    Pt seen this session for ADL training of shower and dressing with a focus on sit to stand in the [facility] lift and active use of his LEs.  Pt stood with CGA to stedy lift and was transferred to tub bench.  He was able to pull his pants down before sitting.  In shower, used crossed legs to wash feet and lateral leans for his bottom.  Transferred back to w/c to dress. Again used crossed legs method for donning pants and shoes and then lateral leans in w/c with touching A to pull pants over hips. Pt then worked on LE strengthening with hip adduction with ball squeezes between knees with B legs and then with focus just on L leg. AROM of LE with knee flex/ext rolling ball in and out with foot focusing on controlled movement patterns.  Pushing foot down on ball as if he was pushing on a gas pedal with min A to control foot.  Pt tolerated exercises well. Resting in w/c with all needs met.  Therapy Documentation Precautions:  Precautions Precautions: Fall Precaution Comments: spinal precautions: no bending, lifting, twisting, arching Restrictions Weight Bearing Restrictions: No Other Position/Activity Restrictions: No brace needed per orders  Pain: Pain Assessment Pain Scale: 0-10 Pain Score: 4  Pain Location: Leg Pain Orientation: Right;Left Patients Stated Pain Goal: 2 Pain Intervention(s): Medication (See eMAR) ADL: ADL ADL  Comments: Please see functional navigator for ADL status  See Function Navigator for Current Functional Status.   Therapy/Group: Individual Therapy  [place] 06/26/2018, 10:27 AM

## 2018-06-26 NOTE — Progress Notes (Signed)
Physical Therapy Session Note  Patient Details  Name: [name] MRN: [id number] Date of Birth: 24-Jan-1946  Today's Date: 06/26/2018 PT Individual Time: 1120-1150 PT Individual Time Calculation (min): 30 min   Short Term Goals: Week 1:  PT Short Term Goal 1 (Week 1): Pt will perform bed mobility with min assist  PT Short Term Goal 2 (Week 1): [name] with min assist and LRAD  PT Short Term Goal 3 (Week 1): [name] PT Short Term Goal 4 (Week 1): [name] >2 minutes with min-mod assist PT Short Term Goal 5 (Week 1): [name] with min assist.   Skilled Therapeutic Interventions/Progress Updates:    Pt asleep upon PT arrival and reports being worn out from AM therapies. Agreeable to session and monitored tolerance throughout. Pt propelled w/c for functional mobility and overall general strengthening and endurance to/from therapy gym. Nustep for reciprocal movement pattern retraining and motor activation using BUE to assist with BLE x 10 min on level 4 with rest breaks as needed due to increased fatigue. Cues for focusing on position of LE's to decrease tendency for abduction. Min assist for slideboard transfer on/off with assist for set-up and placement of board. Using [place], transferred to/from elevated toilet seat with mod assist and min assist for standing balance while performing clothing management - required assist for pulling pants back up. Transferred back to bed via Stedy and required mod assist to manage BLE to get back into the bed into supine.   Therapy Documentation Precautions:  Precautions Precautions: Fall Precaution Comments: spinal precautions: no bending, lifting, twisting, arching Restrictions Weight Bearing Restrictions: No Other Position/Activity Restrictions: No brace needed per orders  Pain: Pain Assessment Pain Scale: 0-10 Pain Score: 4  Pain Location: Leg Pain  Orientation: Right;Left Patients Stated Pain Goal: 2 Pain Intervention(s): Medication (See eMAR)   See Function Navigator for Current Functional Status.   Therapy/Group: Individual Therapy  Canary Brim Ivory Broad, PT, DPT  06/26/2018, 12:03 PM

## 2018-06-26 NOTE — Progress Notes (Signed)
Physical Therapy Session Note  Patient Details  Name: [name] MRN: [id number] Date of Birth: August 06, 1946  Today's Date: 06/26/2018 PT Individual Time: 0830-0930 PT Individual Time Calculation (min): 60 min   Short Term Goals: Week 1:  PT Short Term Goal 1 (Week 1): Pt will perform bed mobility with min assist  PT Short Term Goal 2 (Week 1): [name] with min assist and LRAD  PT Short Term Goal 3 (Week 1): [name] PT Short Term Goal 4 (Week 1): [name] >2 minutes with min-mod assist PT Short Term Goal 5 (Week 1): [name] with min assist.   Skilled Therapeutic Interventions/Progress Updates:    pt performs supine to sit with min/mod A for trunk and LE control, cues for log roll technique.  Sliding board transfers throughout session with min A with assist to place board.  Attempt sit <> stand from elevated mat with RW and max A, pt unable to complete safely this session- increased forward wt shift and unable to take wt on LEs.  Pt performs supine glute sets, heel slides and hip abduction AAROM.  Standing frame mini squats with focus on glute and quad strengthening 6 x 10 reps. Pt performs standing in standing frame a total of 3 x 5 mins.  Pt propels w/c throughout unit with supervision. Pt left in w/c with needs at hand.  Therapy Documentation Precautions:  Precautions Precautions: Fall Precaution Comments: spinal precautions: no bending, lifting, twisting, arching Restrictions Weight Bearing Restrictions: No Other Position/Activity Restrictions: No brace needed per orders Pain: Pt c/o pain in bilat LEs with activity, eases with rest. Pain meds given prior to session   Therapy/Group: Individual Therapy  Deshan Hemmelgarn 06/26/2018, 9:30 AM

## 2018-06-26 NOTE — Progress Notes (Signed)
Occupational Therapy Session Note  Patient Details  Name: [name] MRN: [id number] Date of Birth: 08/09/1946  Today's Date: 06/26/2018 OT Individual Time: 7092-9574 OT Individual Time Calculation (min): 24 min  and Today's Date: 06/26/2018 OT Missed Time: 20 Minutes Missed Time Reason: Other (comment)(family present and MD arrived to provide education)   Short Term Goals: Week 1:  OT Short Term Goal 1 (Week 1): Pt will complete 1/3 components of donning pants with AE as needed OT Short Term Goal 2 (Week 1): Pt will complete toileting with LRAD and 1 helper OT Short Term Goal 3 (Week 1): Pt will complete 1 grooming task in supported standing   Skilled Therapeutic Interventions/Progress Updates:    Upon entering the room, pt supine in bed with brother and sister in law present. Pt with c/o fatigue from prior therapy sessions. Pt also requesting information regarding discharge date with social worker arriving and providing pt with this information. Pt becoming very upset regarding discharge date, goals being at wheelchair level, and with "lack of education" regarding current situation. OT attempting to provide pt with information and answer questions regarding current care as well as explaining need for wheelchair level goals at this time for safety and increased independence. OT also providing pt with paper handout regarding energy conservation for self care tasks, general principles, and community mobility. OT provided several examples of situations pt needs to plan in order to conserve energy after discharge. MD arriving to provide pt and caregivers with education. OT exiting the room and pt missing 20 minutes of skilled OT intervention.   Therapy Documentation Precautions:  Precautions Precautions: Fall Precaution Comments: spinal precautions: no bending, lifting, twisting, arching Restrictions Weight Bearing Restrictions: No Other Position/Activity Restrictions: No brace needed per  orders General: General OT Amount of Missed Time: 20 Minutes Vital Signs: Therapy Vitals Temp: (!) 97.5 F (36.4 C) Temp Source: Oral Pulse Rate: 64 Resp: 18 BP: 126/62 Patient Position (if appropriate): Lying Oxygen Therapy SpO2: 97 % O2 Device: Room Air ADL: ADL ADL Comments: Please see functional navigator for ADL status  See Function Navigator for Current Functional Status.   Therapy/Group: Individual Therapy  Gypsy Decant 06/26/2018, 4:14 PM

## 2018-06-26 NOTE — Progress Notes (Signed)
[place] PHYSICAL MEDICINE & REHABILITATION     PROGRESS NOTE    Subjective/Complaints: Pt without new complaints. Emptying bladder.   ROS: Patient denies fever, rash, sore throat, blurred vision, nausea, vomiting, diarrhea, cough, shortness of breath or chest pain, joint or back pain, headache, or mood change.   Objective:  No results found. No results for input(s): WBC, HGB, HCT, PLT in the last 72 hours. Recent Labs    06/25/18 0847  NA 135  K 4.8  CL 98  GLUCOSE 184*  BUN 43*  CREATININE 1.12  CALCIUM 9.1   CBG (last 3)  No results for input(s): GLUCAP in the last 72 hours.  Wt Readings from Last 3 Encounters:  06/22/18 82.7 kg (182 lb 4.8 oz)  06/22/18 81.6 kg (180 lb)  06/18/18 81.6 kg (180 lb)     Intake/Output Summary (Last 24 hours) at 06/26/2018 0847 Last data filed at 06/25/2018 1834 Gross per 24 hour  Intake 480 ml  Output -  Net 480 ml    Vital Signs: Blood pressure (!) 148/76, pulse (!) 55, temperature 97.7 F (36.5 C), temperature source Oral, resp. rate 18, height 5\' 8"  (1.727 m), weight 82.7 kg (182 lb 4.8 oz), SpO2 98 %. Physical Exam:  Constitutional: No distress . Vital signs reviewed. HEENT: EOMI, oral membranes moist Neck: supple Cardiovascular: RRR without murmur. No JVD    Respiratory: CTA Bilaterally without wheezes or rales. Normal effort    GI: BS +, non-tender, non-distended  Musculoskeletal: He exhibits noedemaor tenderness. Left shin abrasion Neurological: Speech clear. Able to follow basic commands without difficulty.UE 5/5. HAB 3+ to 4/5. ADF 3+/5. APF 4+/5. HF 2/5. KE 2-3/5. No consistent sensory findings for LT/pain although some proprioceptive loss evident Skin:No rashnoted.  Surgical incision remains CDI Psych: pleasant and appropriate    Assessment/Plan: 1. 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 Physiatrist is providing close team supervision and 24 hour management of active medical problems listed below. Physiatrist and rehab team continue to assess barriers to discharge/monitor patient progress toward functional and medical goals.  Function:  Bathing Bathing position   Position: Shower  Bathing parts Body parts bathed by patient: Right arm, Left arm, Chest, Abdomen, Front perineal area, Right upper leg, Left upper leg, Right lower leg, Left lower leg, Buttocks Body parts bathed by helper: Buttocks, Back  Bathing assist Assist Level: Supervision or verbal cues(set up, from seated level)      Upper Body Dressing/Undressing Upper body dressing   What is the patient wearing?: Pull over shirt/dress     Pull over shirt/dress - Perfomed by patient: Thread/unthread right sleeve, Thread/unthread left sleeve, Put head through opening, Pull shirt over trunk          Upper body assist Assist Level: Set up      Lower Body Dressing/Undressing Lower body dressing   What is the patient wearing?: Pants, Non-skid slipper socks, Ted Hose     Pants- Performed by patient: Pull pants up/down Pants- Performed by helper: Thread/unthread right pants leg, Thread/unthread left pants leg Non-skid slipper socks- Performed by patient: Don/doff right sock, Don/doff left sock Non-skid slipper socks- Performed by helper: Don/doff right sock, Don/doff left sock               TED Hose - Performed by helper: Don/doff right TED hose, Don/doff left TED hose  Lower body assist Assist for lower body  dressing: Touching or steadying assistance (Pt > 75%)      Toileting Toileting Toileting activity did not occur: No continent bowel/bladder event Toileting steps completed by patient: Adjust clothing prior to toileting, Performs perineal hygiene Toileting steps completed by helper: Adjust clothing after toileting Toileting Assistive Devices: Grab bar or rail  Toileting assist Assist  level: Set up/obtain supplies, Touching or steadying assistance (Pt.75%)   Transfers Chair/bed transfer   Chair/bed transfer method: Lateral scoot Chair/bed transfer assist level: Set up only Chair/bed transfer assistive device: Sliding board Mechanical lift: Stedy   Locomotion Ambulation Ambulation activity did not occur: Safety/medical concerns   Max distance: 10' Assist level: Total assist (Pt < 25%)   Wheelchair   Type: Manual Max wheelchair distance: 200 Assist Level: Supervision or verbal cues  Cognition Comprehension Comprehension assist level: Follows basic conversation/direction with no assist  Expression Expression assist level: Expresses basic needs/ideas: With no assist  Social Interaction Social Interaction assist level: Interacts appropriately 90% of the time - Needs monitoring or encouragement for participation or interaction.  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 Pt with conus medullaris syndrome -continue therapies 2. DVT Prophylaxis/Anticoagulation: Mechanical:Sequential compression devices, below kneeBilateral lower extremities 3. Pain Management:On celebrex bid with decadron every 8 hours--           -appears controlled at present 4. Mood:team to provide ego support. LCSW to follow for evaluation and support. 5. Neuropsych: This patientiscapable of making decisions on hisown behalf. 6. Skin/Wound Care:monitor wound for healing. Add protein supplement to promote wound healing. 7. Fluids/Electrolytes/Nutrition:BUN 31  -push fluids.   8. Urinary retention/BPH/neurogenic bladder:H/o hypospadias.  -voiding fairly well at present 9. HTN: Monitor BP bid--continue HCTZ and lisinopril.   10. Prediabetes: CM diet. 11. GERD:  continue PPI due to h/o of gastritis and now with celebrex and decadron on board. 12. Leukocytosis; Likely steroid induced  -monitor  -tapering steroids    LOS (Days) 4 A FACE TO FACE EVALUATION WAS PERFORMED  Meredith Staggers, MD 06/26/2018 8:47 AM

## 2018-06-27 ENCOUNTER — Inpatient Hospital Stay (HOSPITAL_COMMUNITY): Payer: Medicare Other | Admitting: *Deleted

## 2018-06-27 ENCOUNTER — Inpatient Hospital Stay (HOSPITAL_COMMUNITY): Payer: Medicare Other | Admitting: Occupational Therapy

## 2018-06-27 ENCOUNTER — Inpatient Hospital Stay (HOSPITAL_COMMUNITY): Payer: Medicare Other | Admitting: Physical Therapy

## 2018-06-27 MED ORDER — SORBITOL 70 % SOLN
60.0000 mL | Status: AC
  Filled 2018-06-27: qty 60

## 2018-06-27 NOTE — Evaluation (Signed)
Recreational Therapy Assessment and Plan  Patient Details  Name: Trevor [name] MRN: [id number] Date of Birth: [birth year] Today's Date: 06/27/2018  Rehab Potential:  Good  ELOS:   2 weeks Assessment   Problem List:      Patient Active Problem List   Diagnosis Date Noted  . Herniation of lumbar intervertebral disc with radiculopathy 06/22/2018  . Lumbar radiculopathy 06/20/2018  . Nocturia 10/23/2015  . Absolute anemia 05/13/2014  . Benign essential HTN 05/13/2014  . Blood glucose elevated 05/13/2014  . LBP (low back pain) 05/13/2014  . HLD (hyperlipidemia) 05/13/2014  . Malignant neoplasm of lateral wall of urinary bladder ([name]) 09/19/2013  . Benign prostatic hypertrophy without urinary obstruction 12/10/2012  . Hypospadias 12/10/2012  . Microscopic hematuria 12/10/2012  . History of neoplasm of bladder 12/10/2012  . FOM (frequency of micturition) 12/10/2012    Past Medical History:  Past Medical History:  Diagnosis Date  . Acne   . Allergic rhinitis   . Anemia   . Anxiety   . BPH (benign prostatic hypertrophy) with urinary obstruction   . Cancer [name])    bladder CA; remission since Oct 2014  . Carpal tunnel syndrome   . Cervicalgia   . Degenerative disc disease, cervical    neck and back;   . Depression    controlled;   Marland Kitchen Gastritis   . GERD (gastroesophageal reflux disease)   . Hemorrhoids   . History of malignant neoplasm of bladder   . Hyperlipidemia   . Hypertension    controlled with medication;   . Hypospadias   . Low back pain   . Mitral regurgitation   . Pre-diabetes   . Sleep apnea   . Urinary frequency    Past Surgical History:       Past Surgical History:  Procedure Laterality Date  . BLADDER SURGERY     2011, 2013, 2015  . COLONOSCOPY WITH PROPOFOL N/A 07/31/2015   Procedure: COLONOSCOPY WITH PROPOFOL;  Surgeon: Trevor Silvas, MD;  Location: [name];  Service: Endoscopy;  Laterality: N/A;  .  COLONOSCOPY, ESOPHAGOGASTRODUODENOSCOPY (EGD) AND ESOPHAGEAL DILATION    . FLEXIBLE SIGMOIDOSCOPY    . LUMBAR LAMINECTOMY/DECOMPRESSION MICRODISCECTOMY N/A 06/20/2018   Procedure: LUMBAR LAMINECTOMY/DECOMPRESSION MICRODISCECTOMY 1 LEVEL-L-2-3;  Surgeon: Trevor Maw, MD;  Location: [name];  Service: Neurosurgery;  Laterality: N/A;  . SEPTOPLASTY    . TONSILLECTOMY      Assessment & Plan Clinical Impression: Patient is a 72 year old male with history of HTN, BPH, hypospadias, bladder cancer, depression/anxiety disorder, DDD with back pain with radiculopathyand LLE weaknesss/p lumbar decompression L3-S1 11/20/17 and was doing well he started developing recurrent pain with radiculopathy with BLE weakness and falls. 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 Izora [name]. Post op course complicated by urinary retention and foley placed by GU on 7/11 with recommendations to keep foley in till 7/15 before removal/voiding trial. He has hadworsening of LLE weakenss withdifficulty standing and decadron wasadded for decline felt to bedue to traction responsefromsurgery. PT/OT evaluations done yesterday revealing significant decline in mobility as was ability to carry out ADLs.  Patient transferred to CIR on 06/22/2018 .   Pt presents with decreased activity tolerance, decreased functional mobility, decreased balance, decreased coordinaiton Limiting pt's independence with leisure/community pursuits.  Plan  Min 1 TR session > 20 minutes during LOS with focus on community reintegration  Recommendations for other services: None   Discharge Criteria: Patient will be discharged  from TR if patient refuses treatment 3 consecutive times without medical reason.  If treatment goals not met, if there is a change in medical status, if patient makes no progress towards goals or if patient is discharged from hospital.  The above assessment, treatment plan,  treatment alternatives and goals were discussed and mutually agreed upon: by patient  Trevor [name] 06/27/2018, 3:52 PM

## 2018-06-27 NOTE — Progress Notes (Signed)
Occupational Therapy Session Note  Patient Details  Name: Trevor [name] MRN: [id number] Date of Birth: 24-Oct-1946  Today's Date: 06/27/2018 OT Individual Time: 0900-1015 OT Individual Time Calculation (min): 75 min   Short Term Goals: Week 1:  OT Short Term Goal 1 (Week 1): Pt will complete 1/3 components of donning pants with AE as needed OT Short Term Goal 2 (Week 1): Pt will complete toileting with LRAD and 1 helper OT Short Term Goal 3 (Week 1): Pt will complete 1 grooming task in supported standing   Skilled Therapeutic Interventions/Progress Updates:    Pt greeted semi-reclined in bed and agreeable to OT treatment session. Pt requesting to shower this session. Stedy used to transfer pt from bed>tub bench>wc with overall min A to stand with Stedy blocking knees. Providing pt with LH sponge for increased access to LB bathing. Utilized leaning method to wash buttocks, but needed OT assist to ensure thoroughness. Dressing completed wc at the sink with pt able to achieve figure 4 position to thread pant legs. He was then able to push up on arm rests while OT assisted with pulling pants up over hips. Educated pt on modified strategy to don TED hose using friction reducing bag with pt able to complete with min A. Pt propelled wc to therapy gym and continued working on sit<>stand in standing frame with focus on decreased use of UEs to achieve stand. Pt completed 5 sit<>stands with min/mod A. Pt propelled wc back to room at end of session and left seated in wc with chair alarm on and needs met.   Therapy Documentation Precautions:  Precautions Precautions: Fall Precaution Comments: spinal precautions: no bending, lifting, twisting, arching Restrictions Weight Bearing Restrictions: No Other Position/Activity Restrictions: No brace needed per orders Pain: Pain Assessment Pain Scale: 0-10 Pain Score: 1  Pain Type: Acute pain Pain Location: Leg Pain Orientation: Right;Left Pain Descriptors /  Indicators: Aching;Discomfort Pain Frequency: Constant Pain Onset: On-going Patients Stated Pain Goal: 0 Pain Intervention(s): Repositioned ADL: ADL ADL Comments: Please see functional navigator for ADL status  See Function Navigator for Current Functional Status.   Therapy/Group: Individual Therapy  Valma Cava 06/27/2018, 12:51 PM

## 2018-06-27 NOTE — Progress Notes (Signed)
Social Work Patient ID: [name], male   DOB: 1946/08/28, 72 y.o.   MRN: [id number]    Met with pt and brother yesterday afternoon to review team conference.  Both aware and agreeable with targeted d/c date of 8/2 and supervision/ mod ind goals, however, they are surprised to learn that they are set for w/c level.  Both began asking questions about the medical reasoning for expecting w/c level and Dr. Naaman Plummer was able to follow up after my discussion to address these questions.   Pt's d/c plan/ location still TBD based on functional gains.  Will continue to follow.  Arlean Thies, LCSW

## 2018-06-27 NOTE — Progress Notes (Signed)
Occupational Therapy Session Note  Patient Details  Name: Trevor [name] MRN: [id number] Date of Birth: 12/21/1945  Today's Date: 06/27/2018 OT Individual Time: 2620-3559 OT Individual Time Calculation (min): 30 min    Short Term Goals: Week 1:  OT Short Term Goal 1 (Week 1): Pt will complete 1/3 components of donning pants with AE as needed OT Short Term Goal 2 (Week 1): Pt will complete toileting with LRAD and 1 helper OT Short Term Goal 3 (Week 1): Pt will complete 1 grooming task in supported standing   Skilled Therapeutic Interventions/Progress Updates:    Pt completed sit to stand and standing intervals during session.  First had pt utilize the [place] to pull up on with min assist to complete sit to stand.  Once standing he was able to maintain with min assist as well.  Next several attempts were completed with therapist in front stabilizing the left knee.  Mod assist needed to complete sit to stand with increased knee buckling noted at time with static standing.  Had pt take small steps forward and backwards with the RLE while standing.  Also had pt complete squat to stand transitions with UEs supported on therapist shoulders with mod to max assist for balance and left knee facilitation.  Finished session with pt in wheelchair and call button and phone in reach.  No report of pain during session.    Therapy Documentation Precautions:  Precautions Precautions: Fall Precaution Comments: spinal precautions: no bending, lifting, twisting, arching Restrictions Weight Bearing Restrictions: No Other Position/Activity Restrictions: No brace needed per orders  Pain: Pain Assessment Pain Scale: Faces Pain Score: 1  Faces Pain Scale: No hurt ADL: See Function Navigator for Current Functional Status.   Therapy/Group: Individual Therapy  Raylei Losurdo OTR/L 06/27/2018, 12:49 PM

## 2018-06-27 NOTE — Patient Care Conference (Signed)
Inpatient RehabilitationTeam Conference and Plan of Care Update Date: 06/26/2018   Time: 2:35  PM    Patient Name: Trevor [name]      Medical Record Number: [id number]  Date of Birth: 01/12/1946 Sex: Male         Room/Bed: 4W11C/4W11C-01 Payor Info: Payor: [place] / Plan: [facility] MEDICARE / Product Type: *No Product type* /    Admitting Diagnosis: Lumbar Radic  Admit Date/Time:  06/22/2018  6:15 PM Admission Comments: No comment available   Primary Diagnosis:  <principal problem not specified> Principal Problem: <principal problem not specified>  Patient Active Problem List   Diagnosis Date Noted  . Herniation of lumbar intervertebral disc with radiculopathy 06/22/2018  . Lumbar radiculopathy 06/20/2018  . Nocturia 10/23/2015  . Absolute anemia 05/13/2014  . Benign essential HTN 05/13/2014  . Blood glucose elevated 05/13/2014  . LBP (low back pain) 05/13/2014  . HLD (hyperlipidemia) 05/13/2014  . Malignant neoplasm of lateral wall of urinary bladder ([place]) 09/19/2013  . Benign prostatic hypertrophy without urinary obstruction 12/10/2012  . Hypospadias 12/10/2012  . Microscopic hematuria 12/10/2012  . History of neoplasm of bladder 12/10/2012  . FOM (frequency of micturition) 12/10/2012    Expected Discharge Date: Expected Discharge Date: 07/13/18  Team Members Present: Physician leading conference: Dr. 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 bladder now emptying. continent of bowels  improve functional mobility  education, bowel and bladder mgt   Bowel/Bladder   continent of bowel and bladder LBM 07/15  pt remain continent of b/b with normal bowel function  assist with toileting  administer laxatives prn   Swallow/Nutrition/ Hydration             ADL's   Mod A sit<>stand w/Stedy, Min/mod A SB transfers, Mod A BADLs  Supervision, Mod I  sit<>stand, modified bathing/dressing, ADL AE, back precautions, pt/family education, general strengthening   Mobility   min assist slideboard, mod assist Stedy transfers; total assist gait/standing with lift equipment; supervision w/c mobility  mod I w/c level transfers; min assist standing balance; mod assist controlled environment gait; mod I w/c mobility  NMR for BLE and balance, strengthening, transfers, endurance, gait with lift equipment, d/c planning   Communication             Safety/Cognition/ Behavioral Observations            Pain   pt pain managed with current plan of care pt has oxycodone 5-10 mg prn Robaxin prn   pts pain <= 2/10   assess patient for pain qshift and prn medicate as directed   Skin   Pt has ecchymosis BUE, right groin/buttock excoriation.   Pt will have no further breakdown above issues will resolve will be free of infection  assess pts skin qshift and prn    Rehab Goals Patient on target to meet rehab goals: Yes *See Care Plan and progress notes for long and short-term goals.     Barriers to Discharge  Current Status/Progress Possible Resolutions Date Resolved   Physician    Medical stability;Neurogenic Bowel & Bladder        see progress notes      Nursing  PT  Decreased caregiver support  sister in law is caregiver for pt's brother who requires physical assist              OT                  SLP                SW                Discharge Planning/Teaching Needs:  Pt has options to d/c to his own home vs to his brother's home (more accessible) dependent on assistance needs.  Teaching needs TBD   Team Discussion:  MD notes pt with conus syndrome;  B/b beginning to show initiation.  Pain is controlled.  Slide board with min assist but sit-stand at max assist.  Mod  assist b/d.  Goals supervision to mod ind.  Pt very motivated and hopeful he will regain enough independence to return to his own home.    Revisions to Treatment Plan:  NA    Continued Need for Acute Rehabilitation Level of Care: The patient requires daily medical management by a physician with specialized training in physical medicine and rehabilitation for the following conditions: Daily direction of a multidisciplinary physical rehabilitation program to ensure safe treatment while eliciting the highest outcome that is of practical value to the patient.: Yes Daily medical management of patient stability for increased activity during participation in an intensive rehabilitation regime.: Yes Daily analysis of laboratory values and/or radiology reports with any subsequent need for medication adjustment of medical intervention for : Neurological problems;Urological problems  Trevor [name] 06/27/2018, 5:10 PM

## 2018-06-27 NOTE — Progress Notes (Addendum)
[facility] PHYSICAL MEDICINE & REHABILITATION     PROGRESS NOTE    Subjective/Complaints: No new complaints this morning other than being constipated. +flatus  ROS: Patient denies fever, rash, sore throat, blurred vision, nausea, vomiting, diarrhea, cough, shortness of breath or chest pain, joint or back pain, headache, or mood change.    Objective:  No results found. No results for input(s): WBC, HGB, HCT, PLT in the last 72 hours. Recent Labs    06/25/18 0847  NA 135  K 4.8  CL 98  GLUCOSE 184*  BUN 43*  CREATININE 1.12  CALCIUM 9.1   CBG (last 3)  No results for input(s): GLUCAP in the last 72 hours.  Wt Readings from Last 3 Encounters:  06/22/18 82.7 kg (182 lb 4.8 oz)  06/22/18 81.6 kg (180 lb)  06/18/18 81.6 kg (180 lb)     Intake/Output Summary (Last 24 hours) at 06/27/2018 0758 Last data filed at 06/26/2018 1700 Gross per 24 hour  Intake 720 ml  Output -  Net 720 ml    Vital Signs: Blood pressure 131/75, pulse (!) 53, temperature 97.8 F (36.6 C), temperature source Oral, resp. rate 18, height 5\' 8"  (1.727 m), weight 82.7 kg (182 lb 4.8 oz), SpO2 97 %. Physical Exam:  Constitutional: No distress . Vital signs reviewed. HEENT: EOMI, oral membranes moist Neck: supple Cardiovascular: RRR without murmur. No JVD    Respiratory: CTA Bilaterally without wheezes or rales. Normal effort    GI: BS +, non-tender, non-distended  Musculoskeletal: He exhibits noedemaor tenderness. Left shin abrasionhealing Neurological: Speech clear. Able to follow basic commands without difficulty.UE 5/5. HAB 3+ to 4/5. ADF 3+/5. APF 4+/5. HF 2/5. KE 2-3/5. No consistent sensory findings for LT/pain although some proprioceptive loss evident--motor and sensory exam stable Skin:No rashnoted.  Surgical incision remains CDI Psych: pleasant and appropriate    Assessment/Plan: 1. 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 Physiatrist is providing close team supervision and 24 hour management of active medical problems listed below. Physiatrist and rehab team continue to assess barriers to discharge/monitor patient progress toward functional and medical goals.  Function:  Bathing Bathing position   Position: Shower  Bathing parts Body parts bathed by patient: Right arm, Left arm, Chest, Abdomen, Front perineal area, Right upper leg, Left upper leg, Right lower leg, Left lower leg, Buttocks Body parts bathed by helper: Back  Bathing assist Assist Level: Supervision or verbal cues      Upper Body Dressing/Undressing Upper body dressing   What is the patient wearing?: Pull over shirt/dress     Pull over shirt/dress - Perfomed by patient: Thread/unthread right sleeve, Thread/unthread left sleeve, Put head through opening, Pull shirt over trunk          Upper body assist Assist Level: Set up      Lower Body Dressing/Undressing Lower body dressing   What is the patient wearing?: Pants, Ted Hose, Shoes     Pants- Performed by patient: Pull pants up/down, Thread/unthread right pants leg, Thread/unthread left pants leg Pants- Performed by helper: Thread/unthread right pants leg, Thread/unthread left pants leg Non-skid slipper socks- Performed by patient: Don/doff right sock, Don/doff left sock Non-skid slipper socks- Performed by helper: Don/doff right sock, Don/doff left sock               TED Hose - Performed by helper: Don/doff right TED hose, Don/doff left TED hose  Lower body assist Assist for lower body dressing: Touching or steadying assistance (Pt > 75%)      Toileting Toileting Toileting activity did not occur: No continent bowel/bladder event Toileting steps completed by patient: Performs perineal hygiene Toileting steps completed by helper: Adjust clothing prior to toileting, Adjust clothing after  toileting Toileting Assistive Devices: Grab bar or rail  Toileting assist Assist level: Two helpers   Transfers Chair/bed transfer   Chair/bed transfer method: Other Chair/bed transfer assist level: Moderate assist (Pt 50 - 74%/lift or lower) Chair/bed transfer assistive device: Mechanical lift Mechanical lift: Stedy   Locomotion Ambulation Ambulation activity did not occur: Safety/medical concerns   Max distance: 10' Assist level: Total assist (Pt < 25%)   Wheelchair   Type: Manual Max wheelchair distance: 200 Assist Level: Supervision or verbal cues  Cognition Comprehension Comprehension assist level: Follows complex conversation/direction with no assist  Expression Expression assist level: Expresses complex ideas: With no assist  Social Interaction Social Interaction assist level: Interacts appropriately with others - No medications needed.  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 Pt with conus medullaris syndrome -continue therapies  -had discussion with patient and brother (HCPOA) regarding dx and prognosis. All asked appropriate questions and are realistic 2. DVT Prophylaxis/Anticoagulation: Mechanical:Sequential compression devices, below kneeBilateral lower extremities 3. Pain Management:On celebrex bid with decadron every 8 hours--           -appears controlled at present 4. Mood:team to provide ego support. LCSW to follow for evaluation and support. 5. Neuropsych: This patientiscapable of making decisions on hisown behalf. 6. Skin/Wound Care:monitor wound for healing. Add protein supplement to promote wound healing. 7. Fluids/Electrolytes/Nutrition:BUN 42  -  push fluids.   -check labs tomorrow  8. Urinary retention/BPH/neurogenic  bladder:H/o hypospadias.  -voiding fairly well at present 9. HTN: Monitor BP bid--continue HCTZ and lisinopril.  10. Prediabetes: CM diet. 11. GERD:  continue PPI due to h/o of gastritis and now with celebrex and decadron on board. 12. Leukocytosis; Likely steroid induced  -monitor  -tapering steroids 13. Neurogenic bowel: advance regminen     LOS (Days) [place] EVALUATION WAS PERFORMED  Meredith Staggers, MD 06/27/2018 7:58 AM

## 2018-06-27 NOTE — Progress Notes (Signed)
Physical Therapy Session Note  Patient Details  Name: Trevor [name] MRN: [id number] Date of Birth: [birth year]  Today's Date: 06/27/2018 PT Individual Time: 4818-5631 PT Individual Time Calculation (min): 85 min   Short Term Goals: Week 1:  PT Short Term Goal 1 (Week 1): Pt will perform bed mobility with min assist  PT Short Term Goal 2 (Week 1): [name] with min assist and LRAD  PT Short Term Goal 3 (Week 1): [name] PT Short Term Goal 4 (Week 1): [name] >2 minutes with min-mod assist PT Short Term Goal 5 (Week 1): [name] with min assist.   Skilled Therapeutic Interventions/Progress Updates: Pt presented in bed agreeable to therapy. Pt performed bed mobility with supervision and use of features. Pt able to place LE in figure 4 position to don shoes. Performed SB transfer to w/c and participated in w/c mobility initially via obstacle course then transporting pt to gift shop. Pt able to negotiate through narrow spaces and around objects with supervision. Pt transported to front of hospital then participated in w/c mobility in front entrance around patio, provided pt instruction in more effieient use of turns for energy conservation. Pt patrtially propelled back to unit with PTA assisting intermittently. After extended rest at rehab gym pt transferred to mat and participated in supine LE strengthening. Pt performed glute sets, qs sets, AA SAQ, AA hip abd/add, isometric adduction, modified bridges with knees over bolster, AA SLR, AA heel slides. All activities performed in sets of 5 varying to fatigue (avg 2-3 sets). Pt requiring breaks between sets due to fatigue. Once completed pt returned to sitting with steadying assist and heavy use of BUE. Pt performed SB transfer to return to w/c with supervision and pt propelled back to room. Pt requesting to remain in w/c at end of session with call bell within reach  and chair alarm set.      Therapy Documentation Precautions:  Precautions Precautions: Fall Precaution Comments: spinal precautions: no bending, lifting, twisting, arching Restrictions Weight Bearing Restrictions: No Other Position/Activity Restrictions: No brace needed per orders General:   Vital Signs: Therapy Vitals Temp: 98 F (36.7 C) Temp Source: Oral Pulse Rate: 61 Resp: 18 BP: 122/66 Patient Position (if appropriate): Lying Oxygen Therapy SpO2: 99 % O2 Device: Room Air Pain: Pain Assessment Pain Scale: 0-10 Pain Score: 4  Pain Type: Acute pain Pain Location: Leg Pain Orientation: Right;Left Pain Descriptors / Indicators: Aching;Discomfort Pain Frequency: Constant Pain Onset: On-going Patients Stated Pain Goal: 0 Pain Intervention(s): Medication (See eMAR) See Function Navigator for Current Functional Status.   Therapy/Group: Individual Therapy  Ashelyn Mccravy  Arsal Tappan, PTA  06/27/2018, 4:15 PM

## 2018-06-28 ENCOUNTER — Inpatient Hospital Stay (HOSPITAL_COMMUNITY): Payer: Medicare Other | Admitting: Physical Therapy

## 2018-06-28 ENCOUNTER — Inpatient Hospital Stay (HOSPITAL_COMMUNITY): Payer: Medicare Other | Admitting: Occupational Therapy

## 2018-06-28 ENCOUNTER — Encounter: Payer: Self-pay | Admitting: Urology

## 2018-06-28 DIAGNOSIS — E875 Hyperkalemia: Secondary | ICD-10-CM

## 2018-06-28 DIAGNOSIS — R7989 Other specified abnormal findings of blood chemistry: Secondary | ICD-10-CM

## 2018-06-28 LAB — BASIC METABOLIC PANEL
Anion gap: 9 (ref 5–15)
BUN: 42 mg/dL — AB (ref 8–23)
CO2: 28 mmol/L (ref 22–32)
Calcium: 9.1 mg/dL (ref 8.9–10.3)
Chloride: 98 mmol/L (ref 98–111)
Creatinine, Ser: 0.9 mg/dL (ref 0.61–1.24)
GFR calc Af Amer: >60 mL/min (ref >60)
GLUCOSE: 137 mg/dL — AB (ref 70–99)
POTASSIUM: 5.3 mmol/L — AB (ref 3.5–5.1)
Sodium: 135 mmol/L (ref 135–145)

## 2018-06-28 MED ORDER — DEXAMETHASONE 4 MG PO TABS
4.0000 mg | ORAL_TABLET | Freq: Two times a day (BID) | ORAL | Status: DC
  Administered 2018-06-28 – 2018-07-01 (×6): 4 mg via ORAL
  Filled 2018-06-28 (×6): qty 1

## 2018-06-28 MED ORDER — SODIUM POLYSTYRENE SULFONATE PO POWD
15.0000 g | Freq: Once | ORAL | Status: AC
  Administered 2018-06-28: 15 g via ORAL
  Filled 2018-06-28 (×2): qty 15

## 2018-06-28 MED ORDER — CELECOXIB 200 MG PO CAPS: 200.0000 mg | ORAL_CAPSULE | Freq: Every day | ORAL | Status: DC

## 2018-06-28 NOTE — Progress Notes (Addendum)
Physical Therapy Weekly Progress Note  Patient Details  Name: Trevor [name] MRN: [id number] Date of Birth: 1946-11-12  Beginning of progress report period: June 23, 2018 End of progress report period: June 28, 2018  Today's Date: 06/28/2018 PT Individual Time: 0827-0923 and 1015-1056 PT Individual Time Calculation (min): 56 min and 41 min  Pt in w/c agreeable to therapy. Pt able to propel w/c throughout unit with supervision.  Continues to require min A for w/c parts management. Pt provided with w/c gloves to decrease hand pain with w/c propulsion.  Sliding board transfers throughout session with set up assist.  Seated wt shifts and partial stands with focus on technique and bilat LE wt bearing.  Pt able to perform sets of 4-5 before fatigue.  Seated adduction squeeze and knee flex/ext with tactile cues to improve mm recruitment.   Standing frame 2 x 5 minutes with mini squats and heel raises with focus on decreasing reliance on UEs and improving LE strength.  Pt left in room with alarm set, needs at hand.  Session 2:  Pt with no c/o pain. Session focused on w/c mobility in community setting in the gift shop.  Problem solved carrying items, elevator negotiation and gift shop negotiation.  Pt able to carry items on lap and maneuver through gift shop with supervision.  Pt performed putting clothes in washer from seated position, requires assist to start washing machine.  Pt left in w/c in room with needs at hand, alarm set.  Patient has met 3 of 5 short term goals.  Pt is progressing well with transfers and w/c mobility, continues to require max A to stand while pulling up, unable to stand with RW at this time.   Patient continues to demonstrate the following deficits muscle weakness and decreased standing balance, decreased postural control and decreased balance strategies and therefore will continue to benefit from skilled PT intervention to increase functional independence with mobility.  Patient  progressing toward long term goals..  Continue plan of care.  PT Short Term Goals Week 1:  PT Short Term Goal 1 (Week 1): Pt will perform bed mobility with min assist  PT Short Term Goal 1 - Progress (Week 1): Met PT Short Term Goal 2 (Week 1): [name] with min assist and LRAD  PT Short Term Goal 2 - Progress (Week 1): Met PT Short Term Goal 3 (Week 1): [name] PT Short Term Goal 3 - Progress (Week 1): Met PT Short Term Goal 4 (Week 1): [name] >2 minutes with min-mod assist PT Short Term Goal 4 - Progress (Week 1): Progressing toward goal PT Short Term Goal 5 (Week 1): [name] with min assist.  PT Short Term Goal 5 - Progress (Week 1): Met Week 2:  PT Short Term Goal 1 (Week 2): pt will perform functional transfers with supervision PT Short Term Goal 2 (Week 2): pt will perform sit <> stand with max A with RW PT Short Term Goal 3 (Week 2): pt will be supervision with w/c parts management  Skilled Therapeutic Interventions/Progress Updates:  Ambulation/gait training;Balance/vestibular training;Community reintegration;Discharge planning;Disease management/prevention;DME/adaptive equipment instruction;Functional electrical stimulation;Functional mobility training;Neuromuscular re-education;Pain management;Patient/family education;Psychosocial support;Skin care/wound management;Splinting/orthotics;Therapeutic Activities;Stair training;UE/LE Coordination activities;Visual/perceptual remediation/compensation;Wheelchair propulsion/positioning;UE/LE Strength taining/ROM;Therapeutic Exercise   Therapy Documentation Precautions:  Precautions Precautions: Fall Precaution Comments: spinal precautions: no bending, lifting, twisting, arching Restrictions Weight Bearing Restrictions: No Other Position/Activity Restrictions: No brace needed per orders Pain:  Pain Assessment Pain Scale: 0-10 Pain Score: 0-No  pain  Therapy/Group: Individual Therapy  Davanta Meuser 06/28/2018, 9:21 AM

## 2018-06-28 NOTE — Progress Notes (Signed)
[place] PHYSICAL MEDICINE & REHABILITATION     PROGRESS NOTE    Subjective/Complaints: Pt feeling well. Moved bowels yesterday. Pain seems controlled  ROS: Patient denies fever, rash, sore throat, blurred vision, nausea, vomiting, diarrhea, cough, shortness of breath or chest pain, joint or back pain, headache, or mood change.     Objective:  No results found. No results for input(s): WBC, HGB, HCT, PLT in the last 72 hours. Recent Labs    06/28/18 0548  NA 135  K 5.3*  CL 98  GLUCOSE 137*  BUN 42*  CREATININE 0.90  CALCIUM 9.1   CBG (last 3)  No results for input(s): GLUCAP in the last 72 hours.  Wt Readings from Last 3 Encounters:  06/22/18 82.7 kg (182 lb 4.8 oz)  06/22/18 81.6 kg (180 lb)  06/18/18 81.6 kg (180 lb)     Intake/Output Summary (Last 24 hours) at 06/28/2018 1017 Last data filed at 06/28/2018 0626 Gross per 24 hour  Intake 1020 ml  Output -  Net 1020 ml    Vital Signs: Blood pressure 140/78, pulse (!) 53, temperature 97.6 F (36.4 C), temperature source Oral, resp. rate 17, height 5\' 8"  (1.727 m), weight 82.7 kg (182 lb 4.8 oz), SpO2 98 %. Physical Exam:  Constitutional: No distress . Vital signs reviewed. HEENT: EOMI, oral membranes moist Neck: supple Cardiovascular: RRR without murmur. No JVD    Respiratory: CTA Bilaterally without wheezes or rales. Normal effort    GI: BS +, non-tender, non-distended  Musculoskeletal: He exhibits noedemaor tenderness. Left shin abrasionhealed Neurological: Speech clear. Able to follow basic commands without difficulty.UE 5/5. HAB 3+ to 4/5. ADF 3+/5. APF 4+/5. HF 2/5. KE 2-3/5.--stable. No consistent sensory findings for LT/pain although some proprioceptive loss evident-  Skin:No rashnoted.  Surgical incision remains CDI Psych: pleasant and appropriate    Assessment/Plan: 1. 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 Physiatrist is providing close team supervision and 24 hour management of active medical problems listed below. Physiatrist and rehab team continue to assess barriers to discharge/monitor patient progress toward functional and medical goals.  Function:  Bathing Bathing position   Position: Shower  Bathing parts Body parts bathed by patient: Right arm, Left arm, Chest, Abdomen, Front perineal area, Left upper leg, Right lower leg, Left lower leg, Right upper leg Body parts bathed by helper: Back, Buttocks  Bathing assist Assist Level: Touching or steadying assistance(Pt > 75%)      Upper Body Dressing/Undressing Upper body dressing   What is the patient wearing?: Pull over shirt/dress     Pull over shirt/dress - Perfomed by patient: Thread/unthread right sleeve, Thread/unthread left sleeve, Put head through opening, Pull shirt over trunk          Upper body assist Assist Level: Set up      Lower Body Dressing/Undressing Lower body dressing   What is the patient wearing?: Pants, Ted Hose, Shoes     Pants- Performed by patient: Thread/unthread right pants leg, Thread/unthread left pants leg Pants- Performed by helper: Pull pants up/down Non-skid slipper socks- Performed by patient: Don/doff right sock, Don/doff left sock Non-skid slipper socks- Performed by helper: Don/doff right sock, Don/doff left sock     Shoes - Performed by patient: Don/doff right shoe, Don/doff left shoe, Fasten right, Fasten left         TED Hose - Performed by helper: Don/doff left TED hose,  Don/doff right TED hose  Lower body assist Assist for lower body dressing: Touching or steadying assistance (Pt > 75%)      Toileting Toileting Toileting activity did not occur: No continent bowel/bladder event Toileting steps completed by patient: Performs perineal hygiene Toileting steps completed by helper: Adjust clothing prior to toileting, Adjust  clothing after toileting Toileting Assistive Devices: Grab bar or rail  Toileting assist Assist level: Two helpers   Transfers Chair/bed transfer   Chair/bed transfer method: Other Chair/bed transfer assist level: Moderate assist (Pt 50 - 74%/lift or lower) Chair/bed transfer assistive device: Mechanical lift Mechanical lift: Stedy   Locomotion Ambulation Ambulation activity did not occur: Safety/medical concerns   Max distance: 10' Assist level: Total assist (Pt < 25%)   Wheelchair   Type: Manual Max wheelchair distance: 200 Assist Level: Supervision or verbal cues  Cognition Comprehension Comprehension assist level: Follows complex conversation/direction with no assist  Expression Expression assist level: Expresses complex ideas: With no assist  Social Interaction Social Interaction assist level: Interacts appropriately with others - No medications needed.  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 Pt with conus medullaris syndrome -continue therapies 2. DVT Prophylaxis/Anticoagulation: Mechanical:Sequential compression devices, below kneeBilateral lower extremities 3. Pain Management:On celebrex bid with decadron every 8 hours--           -decreased meds per below 4. Mood:team to provide ego support. LCSW to follow for evaluation and support. 5. Neuropsych: This patientiscapable of making decisions on hisown behalf. 6. Skin/Wound Care:monitor wound for healing. Add protein supplement to promote wound healing. 7. Fluids/Electrolytes/Nutrition:BUN 42 (43 yesterday)  - continue to push fluids.   - potassium elevated (5.3), 15g kayexalate today  -decreased celebrex to qd  -recheck in AM 8. Urinary retention/BPH/neurogenic bladder:H/o  hypospadias.  -voiding fairly well at present 9. HTN: Monitor BP bid--  and lisinopril. HCTZ stopped 10. Prediabetes: CM diet. 11. GERD:  continue PPI due to h/o of gastritis and now with celebrex and decadron on board. 12. Leukocytosis; Likely steroid induced  -monitor  -tapering steroids, weaning decadron to q12 13. Neurogenic bowel: advanced regminen with results    LOS (Days) [place] EVALUATION WAS PERFORMED  Meredith Staggers, MD 06/28/2018 10:17 AM

## 2018-06-28 NOTE — Progress Notes (Signed)
Occupational Therapy Session Note  Patient Details  Name: Trevor [name] MRN: [id number] Date of Birth: Feb 18, 1946  Today's Date: 06/28/2018  Session 1 OT Individual Time: 3112-1624 OT Individual Time Calculation (min): 45 min   Session 2 OT Individual Time: 4695-0722 OT Individual Time Calculation (min): 60 min   Short Term Goals: Week 1:  OT Short Term Goal 1 (Week 1): Pt will complete 1/3 components of donning pants with AE as needed OT Short Term Goal 2 (Week 1): Pt will complete toileting with LRAD and 1 helper OT Short Term Goal 3 (Week 1): Pt will complete 1 grooming task in supported standing   Skilled Therapeutic Interventions/Progress Updates:    Session 1 Pt greeted sitting in wc and agreeable to OT treatment session. Stedy used to transfer in/out of shower with mod A to come to standing initially from lower wc surface. Bathing completed with min A to wash buttocks using leaning method and verbal cues to maintain back precautions and not bend to wash feet. Dressing completed wc level at the sink with pt able to complete all UB dressing with set-up A. Verbal cues for dressing techniques for threading B LEs into pants. Pt then able to push up using arm rests and clear bottom from chair for OT to help pull pants over hips. Pt left seated in wc at end of session with lunch tray present and needs met.  Session 2 Pt declined needing to use the bathroom and propelled wc to laundry room. Pt came into standing with OT blocking LLE and pulling up on washing machine. Pt needed mod A to maintain standing while transferring laundry over to dryer. Pt then propelled wc to therapy gym and educated pt on wc functions and how to position wc for safety with transfers. Pt needed demonstration for how to swing out leg rests, but was then able to demonstrate understanding. Assistance to place slideboard, then pt able to transfer to therapy mat with min A. LB strengthening with 3 sets of 10 hip abduction,  adduction, flexion, knee extension using ball and orange thera-band for hip abduction/adduction. Pt returned to room and folded laundry from wc level. Educated pt on wc positioning with dresser drawer to place clothing which pt demonstrated understanding. Pt left seated in wc at end of session with needs met.    Therapy Documentation Precautions:  Precautions Precautions: Fall Precaution Comments: spinal precautions: no bending, lifting, twisting, arching Restrictions Weight Bearing Restrictions: No Other Position/Activity Restrictions: No brace needed per orders Pain:   none/denies pain ADL: ADL ADL Comments: Please see functional navigator for ADL status  See Function Navigator for Current Functional Status.   Therapy/Group: Individual Therapy  Valma Cava 06/28/2018, 2:19 PM

## 2018-06-28 NOTE — Plan of Care (Signed)
  Problem: Consults Goal: RH SPINAL CORD INJURY PATIENT EDUCATION Description  See Patient Education module for education specifics.  Outcome: Progressing   Problem: SCI BOWEL ELIMINATION Goal: RH STG MANAGE BOWEL WITH ASSISTANCE Description STG Manage Bowel with mod Assistance.  Outcome: Progressing   Problem: SCI BLADDER ELIMINATION Goal: RH STG MANAGE BLADDER WITH ASSISTANCE Description STG Manage Bladder With mod Assistance  Outcome: Progressing   Problem: RH SKIN INTEGRITY Goal: RH STG MAINTAIN SKIN INTEGRITY WITH ASSISTANCE Description STG Maintain Skin Integrity With mod I Assistance.  Outcome: Progressing   Problem: RH SAFETY Goal: RH STG ADHERE TO SAFETY PRECAUTIONS W/ASSISTANCE/DEVICE Description STG Adhere to Safety Precautions With min Assistance and appropriate assistive Device.  Outcome: Progressing Goal: RH STG DECREASED RISK OF FALL WITH ASSISTANCE Description STG Decreased Risk of Fall With toileting min Assistance.   Outcome: Progressing   Problem: RH PAIN MANAGEMENT Goal: RH STG PAIN MANAGED AT OR BELOW PT'S PAIN GOAL Description <3 on a 0-10 pain scale  Outcome: Progressing

## 2018-06-29 ENCOUNTER — Inpatient Hospital Stay (HOSPITAL_COMMUNITY): Payer: Medicare Other | Admitting: Physical Therapy

## 2018-06-29 ENCOUNTER — Inpatient Hospital Stay (HOSPITAL_COMMUNITY): Payer: Medicare Other | Admitting: Occupational Therapy

## 2018-06-29 LAB — BASIC METABOLIC PANEL
Anion gap: 8 (ref 5–15)
BUN: 40 mg/dL — AB (ref 8–23)
CO2: 27 mmol/L (ref 22–32)
CREATININE: 0.91 mg/dL (ref 0.61–1.24)
Calcium: 8.6 mg/dL — ABNORMAL LOW (ref 8.9–10.3)
Chloride: 98 mmol/L (ref 98–111)
GFR calc Af Amer: >60 mL/min (ref >60)
Glucose, Bld: 138 mg/dL — ABNORMAL HIGH (ref 70–99)
POTASSIUM: 4.4 mmol/L (ref 3.5–5.1)
SODIUM: 133 mmol/L — AB (ref 135–145)

## 2018-06-29 LAB — CBC
HEMATOCRIT: 40.3 % (ref 39.0–52.0)
Hemoglobin: 13.5 g/dL (ref 13.0–17.0)
MCH: 29.2 pg (ref 26.0–34.0)
MCHC: 33.5 g/dL (ref 30.0–36.0)
MCV: 87.2 fL (ref 78.0–100.0)
PLATELETS: 197 10*3/uL (ref 150–400)
RBC: 4.62 MIL/uL (ref 4.22–5.81)
RDW: 12 % (ref 11.5–15.5)
WBC: 14.5 10*3/uL — AB (ref 4.0–10.5)

## 2018-06-29 MED ORDER — TRAZODONE HCL 50 MG PO TABS
50.0000 mg | ORAL_TABLET | Freq: Every evening | ORAL | Status: DC | PRN
  Administered 2018-06-30 – 2018-07-11 (×6): 50 mg via ORAL
  Filled 2018-06-29 (×9): qty 1

## 2018-06-29 MED ORDER — TRAZODONE HCL 50 MG PO TABS
100.0000 mg | ORAL_TABLET | Freq: Every day | ORAL | Status: DC
  Administered 2018-06-29 – 2018-07-12 (×14): 100 mg via ORAL
  Filled 2018-06-29 (×14): qty 2

## 2018-06-29 NOTE — Progress Notes (Addendum)
[place] PHYSICAL MEDICINE & REHABILITATION     PROGRESS NOTE    Subjective/Complaints: Didn't sleep well last night despite trazodone.  Making progress with therapies  ROS: Patient denies fever, rash, sore throat, blurred vision, nausea, vomiting, diarrhea, cough, shortness of breath or chest pain, joint or back pain, headache, or mood change.   Objective:  No results found. Recent Labs    06/29/18 0543  WBC 14.5*  HGB 13.5  HCT 40.3  PLT 197   Recent Labs    06/28/18 0548 06/29/18 0543  NA 135 133*  K 5.3* 4.4  CL 98 98  GLUCOSE 137* 138*  BUN 42* 40*  CREATININE 0.90 0.91  CALCIUM 9.1 8.6*   CBG (last 3)  No results for input(s): GLUCAP in the last 72 hours.  Wt Readings from Last 3 Encounters:  06/22/18 82.7 kg (182 lb 4.8 oz)  06/22/18 81.6 kg (180 lb)  06/18/18 81.6 kg (180 lb)     Intake/Output Summary (Last 24 hours) at 06/29/2018 1017 Last data filed at 06/29/2018 0830 Gross per 24 hour  Intake 840 ml  Output -  Net 840 ml    Vital Signs: Blood pressure 128/68, pulse (!) 57, temperature 97.9 F (36.6 C), temperature source Oral, resp. rate 18, height 5\' 8"  (1.727 m), weight 82.7 kg (182 lb 4.8 oz), SpO2 98 %. Physical Exam:  Constitutional: No distress . Vital signs reviewed. HEENT: EOMI, oral membranes moist Neck: supple Cardiovascular: RRR without murmur. No JVD    Respiratory: CTA Bilaterally without wheezes or rales. Normal effort    GI: BS +, non-tender, non-distended  Musculoskeletal: He exhibits noedemaor tenderness. Left shin abrasionhealed Neurological: Speech clear. Able to follow basic commands without difficulty.UE 5/5. HAB 3+ to 4/5. ADF 3+/5. APF 4+/5. HF 2/5. KE 2-3/5.--stable exam Proprioceptive deficits in legs. Skin:No rashnoted.  Surgical incision CDI Psych: pleasant and appropriate    Assessment/Plan: 1. 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 Physiatrist is providing close team supervision and 24 hour management of active medical problems listed below. Physiatrist and rehab team continue to assess barriers to discharge/monitor patient progress toward functional and medical goals.  Function:  Bathing Bathing position   Position: Shower  Bathing parts Body parts bathed by patient: Right arm, Left arm, Chest, Abdomen, Front perineal area, Left upper leg, Right lower leg, Left lower leg, Right upper leg Body parts bathed by helper: Back, Buttocks  Bathing assist Assist Level: Touching or steadying assistance(Pt > 75%)      Upper Body Dressing/Undressing Upper body dressing   What is the patient wearing?: Pull over shirt/dress     Pull over shirt/dress - Perfomed by patient: Thread/unthread right sleeve, Thread/unthread left sleeve, Put head through opening, Pull shirt over trunk          Upper body assist Assist Level: Set up      Lower Body Dressing/Undressing Lower body dressing   What is the patient wearing?: Pants, Ted Hose, Shoes     Pants- Performed by patient: Thread/unthread right pants leg, Thread/unthread left pants leg Pants- Performed by helper: Pull pants up/down Non-skid slipper socks- Performed by patient: Don/doff right sock, Don/doff left sock Non-skid slipper socks- Performed by helper: Don/doff right sock, Don/doff left sock     Shoes - Performed by patient: Don/doff right shoe, Don/doff left shoe, Fasten right, Fasten left  TED Hose - Performed by helper: Don/doff left TED hose, Don/doff right TED hose  Lower body assist Assist for lower body dressing: Touching or steadying assistance (Pt > 75%)      Toileting Toileting Toileting activity did not occur: No continent bowel/bladder event Toileting steps completed by patient: Performs perineal hygiene Toileting steps completed by helper: Adjust clothing prior to  toileting, Adjust clothing after toileting Toileting Assistive Devices: Grab bar or rail  Toileting assist Assist level: Two helpers   Transfers Chair/bed transfer   Chair/bed transfer method: Other Chair/bed transfer assist level: Moderate assist (Pt 50 - 74%/lift or lower) Chair/bed transfer assistive device: Mechanical lift Mechanical lift: Stedy   Locomotion Ambulation Ambulation activity did not occur: Safety/medical concerns   Max distance: 10' Assist level: Total assist (Pt < 25%)   Wheelchair   Type: Manual Max wheelchair distance: 200 Assist Level: Supervision or verbal cues  Cognition Comprehension Comprehension assist level: (P) Follows complex conversation/direction with no assist  Expression Expression assist level: (P) Expresses complex ideas: With no assist  Social Interaction Social Interaction assist level: (P) Interacts appropriately with others - No medications needed.  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 Pt with conus medullaris/cauda equina syndrome -continue therapies 2. DVT Prophylaxis/Anticoagulation: Mechanical:Sequential compression devices, below kneeBilateral lower extremities 3. Pain Management:On celebrex bid with decadron every 8 hours--           -decreased meds per below 4. Mood:team to provide ego support. LCSW to follow for evaluation and support.  -sl increase trazodone for insomnia  -encouraged him to use later if needed. 5. Neuropsych: This patientiscapable of making decisions on hisown behalf. 6. Skin/Wound Care:monitor wound for healing. Add protein supplement to promote wound healing. 7. Fluids/Electrolytes/Nutrition:BUN trending down 43--42--40 7/19  - continue to push fluids.   - potassium  elevated (5.3), 15g kayexalate ---> 4.4 today  -decreased celebrex to qd  -recheck labs Monday 8. Urinary retention/BPH/neurogenic bladder:H/o hypospadias.  -voiding fairly well at present 9. HTN: Monitor BP bid--  and lisinopril. HCTZ stopped 10. Prediabetes: CM diet. 11. GERD:  continue PPI due to h/o of gastritis and now with celebrex and decadron on board. 12. Leukocytosis; Likely steroid induced  -monitor  -tapering steroids, decreased decadron to q12--taper further Monday  13. Neurogenic bowel: advanced regminen with results    LOS (Days) [place] EVALUATION WAS PERFORMED  Meredith Staggers, MD 06/29/2018 10:17 AM

## 2018-06-29 NOTE — Progress Notes (Signed)
Occupational Therapy Weekly Progress Note  Patient Details  Name: Trevor [name] MRN: [id number] Date of Birth: [birth year]  Beginning of progress report period: June 22, 2018 End of progress report period: June 29, 2018  Today's Date: 06/29/2018 OT Individual Time: 1478-2956 OT Individual Time Calculation (min): 54 min   Patient has met 3 of 3 short term goals. Pt is making progress towards OT goals at this time. He has improved sit<>stands and can sit<>stand with min A in Stedy and mod A with L knee block without Stedy, but has to pull up. Slideboard transfers are min A with assistance to place board, and we are working toward squat-pivot transfer. Pt is more independent with BADL tasks and is able to achieve figure 4 position for LB dressing tasks. Continue current POC.  Patient continues to demonstrate the following deficits: muscle weakness and decreased sitting balance, decreased standing balance, decreased postural control, decreased balance strategies and difficulty maintaining precautions and therefore will continue to benefit from skilled OT intervention to enhance overall performance with BADL.  Patient progressing toward long term goals..  Continue plan of care.  OT Short Term Goals Week 1:  OT Short Term Goal 1 (Week 1): Pt will complete 1/3 components of donning pants with AE as needed OT Short Term Goal 1 - Progress (Week 1): Met OT Short Term Goal 2 (Week 1): Pt will complete toileting with LRAD and 1 helper OT Short Term Goal 2 - Progress (Week 1): Met OT Short Term Goal 3 (Week 1): Pt will complete 1 grooming task in supported standing  OT Short Term Goal 3 - Progress (Week 1): Progressing toward goal Week 2:  OT Short Term Goal 1 (Week 2): Pt will complete toilet/BSC transfer with no more than Mod A OT Short Term Goal 2 (Week 2): Pt will utilize leaning method in shower to wash buttocks OT Short Term Goal 3 (Week 2): Pt will complete 1 grooming task in supported standing   OT Short Term Goal 4 (Week 2): Pt will follow back precautions within BADL tasks with no more than min questioning cues  Skilled Therapeutic Interventions/Progress Updates:    Pt greeted seated in wc with nursing present. Pt states he did not get any sleep last night and was not feeling well today, but agreeable to try to participate in therapy. Stedy used to transfer pt into shower with CGA to stand in supported [place]. Increased independence with bathing tasks as pt was able to wash buttocks today using leaning method. Dressing completed wc level at the sink with increased time to thread LEs in figure 4 position. Sit<>stand without stedy with OT blocking L knee and pulling up on sink with mod A. Pt then able to remove unilateral UE to pull up pants with OT provided min/mod A for balance. Pt took seated rest break, then agreeable to stand again for grooming task of combing hair. Pt then propelled wc to dayroom and tolerated 5 mins standing in standing frame while participating in connect 4 activity. Pt requests to return to room 2/2 needing to go to the bathroom. Stedy used for toilet transfer with min A. Pt left seated on toilet with nurse tech notified.   Therapy Documentation Precautions:  Precautions Precautions: Fall Precaution Comments: spinal precautions: no bending, lifting, twisting, arching Restrictions Weight Bearing Restrictions: No Other Position/Activity Restrictions: No brace needed per orders Pain: Pain Assessment Pain Scale: 0-10 Pain Score: 0-No pain ADL: ADL ADL Comments: Please see functional navigator for ADL  status  See Function Navigator for Current Functional Status.   Therapy/Group: Individual Therapy  Valma Cava 06/29/2018, 8:00 AM

## 2018-06-29 NOTE — Progress Notes (Signed)
Occupational Therapy Session Note  Patient Details  Name: Trevor [name] MRN: [id number] Date of Birth: 24-Nov-1946  Today's Date: 06/29/2018 OT Individual Time: 1350-1450 OT Individual Time Calculation (min): 60 min    Short Term Goals: Week 1:  OT Short Term Goal 1 (Week 1): Pt will complete 1/3 components of donning pants with AE as needed OT Short Term Goal 1 - Progress (Week 1): Met OT Short Term Goal 2 (Week 1): Pt will complete toileting with LRAD and 1 helper OT Short Term Goal 2 - Progress (Week 1): Met OT Short Term Goal 3 (Week 1): Pt will complete 1 grooming task in supported standing  OT Short Term Goal 3 - Progress (Week 1): Met Week 2:  OT Short Term Goal 1 (Week 2): Pt will complete toilet/BSC transfer with no more than Mod A OT Short Term Goal 2 (Week 2): Pt will utilize leaning method in shower to wash buttocks OT Short Term Goal 3 (Week 2): Pt will complete 2 grooming tasks in supported standing  OT Short Term Goal 4 (Week 2): Pt will follow back precautions within BADL tasks with no more than min questioning cues  Skilled Therapeutic Interventions/Progress Updates:    1:1 Pt in bed when arrived. Focus on bed mobility to come to EOB with setup and pulling on pants to manage left> right LEs. Pt perform slide board transfer with setup and contact guard to supervision. Performed slide board transfer from w/c to padded tub bench over the commode with setup and contact guard. Pt able to perform lateral leans to pull pants pants with steadying A. Pt able to pull them back up with min A again with lateral leans. Return to w/c via slide board with contact guard. Pt able to wash hands at sink mod I.  transitioned to gym and transfer to mat again with contact guard and transitioned to supine with min A. Perform LE exercises with aim to activate hip abduction/ adduction with control and hip flexion and extension with control with AAROM and with maxi slides. Also trial of use of leg loops  with our sample pair to increased management of LEs during ADL (especially if not wearing sweat pants. Pt able to place board on his own with extra time and transfer back into w/c with close supervision. Setup up in room with call bell and left up in w/c.  Therapy Documentation Precautions:  Precautions Precautions: Fall Precaution Comments: spinal precautions: no bending, lifting, twisting, arching Restrictions Weight Bearing Restrictions: No Other Position/Activity Restrictions: No brace needed per orders Pain:  Some mm discomfort in thighs with exercises but went away with rest breaks ADL: ADL ADL Comments: Please see functional navigator for ADL status  See Function Navigator for Current Functional Status.   Therapy/Group: Individual Therapy  Willeen Cass [facility] 06/29/2018, 3:20 PM

## 2018-06-29 NOTE — Progress Notes (Signed)
Physical Therapy Session Note  Patient Details  Name: Trevor [name] MRN: [id number] Date of Birth: [birth year]  Today's Date: 06/29/2018 PT Individual Time: 0915-1023 PT Individual Time Calculation (min): 68 min   Short Term Goals: Week 2:  PT Short Term Goal 1 (Week 2): pt will perform functional transfers with supervision PT Short Term Goal 2 (Week 2): pt will perform sit <> stand with max A with RW PT Short Term Goal 3 (Week 2): pt will be supervision with w/c parts management  Skilled Therapeutic Interventions/Progress Updates:    pt in w/c agreeable to therapy.  Pt c/o "soreness" from previous days activity, but no c/o pain.  Pt propels w/c throughout unit with supervision.  Pt able to perform w/c parts management with supervision cuing.  Pt provided with basic cushion as pt c/o posterior pelvic tilt with gel cushion, pt with improved posture and sitting tolerance with basic cushion.  Sliding board transfers throughout session with set up assist.  nustep x 5 minutes level 4 with focus on decreasing reliance on UEs to improve LE strength.  Supine therex AAROM for SAQ, hip adduction squeeze, glute sets, heel slides.  All therex in sets of 5 due to pt tolerance. Supine shoulder flexion 3 x 10.  Pt able to perform supine <> sit from mat with min A.  Pt given HEP handout of supine and seated therex for continued LE strengthening.  Pt left in chair with alarm set, needs at hand.  Therapy Documentation Precautions:  Precautions Precautions: Fall Precaution Comments: spinal precautions: no bending, lifting, twisting, arching Restrictions Weight Bearing Restrictions: No Other Position/Activity Restrictions: No brace needed per orders Pain: Pain Assessment Pain Scale: 0-10 Pain Score: 0-No pain   Therapy/Group: Individual Therapy  DONAWERTH,KAREN 06/29/2018, 10:24 AM

## 2018-06-30 ENCOUNTER — Inpatient Hospital Stay (HOSPITAL_COMMUNITY): Payer: Medicare Other | Admitting: Occupational Therapy

## 2018-06-30 DIAGNOSIS — G834 Cauda equina syndrome: Secondary | ICD-10-CM

## 2018-06-30 DIAGNOSIS — D72829 Elevated white blood cell count, unspecified: Secondary | ICD-10-CM

## 2018-06-30 NOTE — Progress Notes (Signed)
Occupational Therapy Session Note  Patient Details  Name: TRYSTYN DOLLEY MRN: [id number] Date of Birth: [birth year]  Today's Date: 06/30/2018 OT Group Time: 1100-1200 OT Group Time Calculation (min): 60 min   Skilled Therapeutic Interventions/Progress Updates:    Pt participated in therapeutic w/c level dance group with focus on UE/LE strengthening, activity tolerance, and social participation for carryover during self care tasks. Pt was guided through various dance-based exercises involving UB/LB and trunk. Emphasis placed on LE mgt/NMR, with pt requiring bilateral UE support to move them and achieve seated figure 4. Pt requested several songs during session and conversed with other members. At end of tx he self propelled back to room.   Therapy Documentation Precautions:  Precautions Precautions: Fall Precaution Comments: spinal precautions: no bending, lifting, twisting, arching Restrictions Weight Bearing Restrictions: No Other Position/Activity Restrictions: No brace needed per orders  Pain: No c/o pain during tx   ADL: ADL ADL Comments: Please see functional navigator for ADL status     See Function Navigator for Current Functional Status.   Therapy/Group: Group Therapy  Mitch Arquette A Kloie Whiting 06/30/2018, 12:41 PM

## 2018-06-30 NOTE — Progress Notes (Signed)
Occupational Therapy Session Note  Patient Details  Name: Trevor [name] MRN: [id number] Date of Birth: [birth year]  Today's Date: 06/30/2018 OT Individual Time: 2706-2376 OT Individual Time Calculation (min): 49 min    Short Term Goals: Week 2:  OT Short Term Goal 1 (Week 2): Pt will complete toilet/BSC transfer with no more than Mod A OT Short Term Goal 2 (Week 2): Pt will utilize leaning method in shower to wash buttocks OT Short Term Goal 3 (Week 2): Pt will complete 2 grooming tasks in supported standing  OT Short Term Goal 4 (Week 2): Pt will follow back precautions within BADL tasks with no more than min questioning cues  Skilled Therapeutic Interventions/Progress Updates:    Pt completed transfer from wheelchair to the tub bench with total assist using the [facility].  He was able to pull up on the [facility] for standing however with min assist.  He then completed doffing clothing sit to stand on the tub seat with min assist sit to stand using the [facility] for support as well.  Bathing completed at supervision in sitting with use of the LH sponge for washing his back and feet.  Lateral leans and use of cutout on the bench for washing his buttocks with supervision. Transfer from the shower also completed with use of the [place].  He completed LB dressing sit to stand with use of the [place] as well.  Therapist applied barrier cream to pt's buttocks in standing as he reported irritation.  He was able to self manage his LEs and cross one over the opposite knee for all LB dressing including donning TEDs.  Finished session with sliding board transfer from wheelchair to the bed with min assist.  Pt transitioned to supine with supervision.  Call button and phone in reach.    Therapy Documentation Precautions:  Precautions Precautions: Fall Precaution Comments: spinal precautions: no bending, lifting, twisting, arching Restrictions Weight Bearing Restrictions: No Other Position/Activity Restrictions: No brace  needed per orders  Pain: Pain Assessment Pain Score: 0-No pain ADL: See Function Navigator for Current Functional Status.   Therapy/Group: Individual Therapy  Rashaad Hallstrom OTR/L 06/30/2018, 3:46 PM

## 2018-06-30 NOTE — Progress Notes (Signed)
[place] PHYSICAL MEDICINE & REHABILITATION     PROGRESS NOTE    Subjective/Complaints: Since slept better last night no significant back pain this morning.  He states that he mainly has problems moving his upper legs but he can move his foot and ankle okay, no bowel or bladder issues  ROS: Denies chest pain, shortness of breath, nausea vomiting diarrhea constipation Objective:  No results found. Recent Labs    06/29/18 0543  WBC 14.5*  HGB 13.5  HCT 40.3  PLT 197   Recent Labs    06/28/18 0548 06/29/18 0543  NA 135 133*  K 5.3* 4.4  CL 98 98  GLUCOSE 137* 138*  BUN 42* 40*  CREATININE 0.90 0.91  CALCIUM 9.1 8.6*   CBG (last 3)  No results for input(s): GLUCAP in the last 72 hours.  Wt Readings from Last 3 Encounters:  06/22/18 82.7 kg (182 lb 4.8 oz)  06/22/18 81.6 kg (180 lb)  06/18/18 81.6 kg (180 lb)     Intake/Output Summary (Last 24 hours) at 06/30/2018 1128 Last data filed at 06/29/2018 1900 Gross per 24 hour  Intake 480 ml  Output -  Net 480 ml    Vital Signs: Blood pressure 115/66, pulse (!) 53, temperature 97.7 F (36.5 C), temperature source Oral, resp. rate 18, height 5\' 8"  (1.727 m), weight 82.7 kg (182 lb 4.8 oz), SpO2 99 %. Physical Exam:  Constitutional: No distress . Vital signs reviewed. HEENT: EOMI, oral membranes moist Neck: supple Cardiovascular: RRR without murmur. No JVD    Respiratory: CTA Bilaterally without wheezes or rales. Normal effort    GI: BS +, non-tender, non-distended  Musculoskeletal: He exhibits noedemaor tenderness. Left shin abrasionhealed Neurological: Speech clear. Able to follow basic commands without difficulty.UE 5/5. HAB 3+ to 4/5. ADF 3+/5. APF 4+/5. HF 2/5. KE 2-3/5.--stable exam Proprioceptive deficits in legs. Skin:No rashnoted.  Surgical incision CDI Psych: pleasant and appropriate    Assessment/Plan: 1. 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 Physiatrist is providing close team supervision and 24 hour management of active medical problems listed below. Physiatrist and rehab team continue to assess barriers to discharge/monitor patient progress toward functional and medical goals.  Function:  Bathing Bathing position   Position: Shower  Bathing parts Body parts bathed by patient: Left arm, Right arm, Abdomen, Chest, Front perineal area, Buttocks, Right upper leg, Left upper leg, Right lower leg, Left lower leg Body parts bathed by helper: Back, Buttocks  Bathing assist Assist Level: Assistive device, Supervision or verbal cues Assistive Device Comment: LH sponge    Upper Body Dressing/Undressing Upper body dressing   What is the patient wearing?: Pull over shirt/dress     Pull over shirt/dress - Perfomed by patient: Thread/unthread right sleeve, Thread/unthread left sleeve, Put head through opening, Pull shirt over trunk          Upper body assist Assist Level: Set up      Lower Body Dressing/Undressing Lower body dressing   What is the patient wearing?: Pants, Non-skid slipper socks     Pants- Performed by patient: Thread/unthread right pants leg, Thread/unthread left pants leg Pants- Performed by helper: Pull pants up/down Non-skid slipper socks- Performed by patient: Don/doff left sock, Don/doff right sock Non-skid slipper socks- Performed by helper: Don/doff right sock, Don/doff left sock     Shoes - Performed by patient: Don/doff right shoe, Don/doff left shoe, Fasten  right, Fasten left         TED Hose - Performed by helper: Don/doff left TED hose, Don/doff right TED hose  Lower body assist Assist for lower body dressing: Touching or steadying assistance (Pt > 75%)      Toileting Toileting Toileting activity did not occur: No continent bowel/bladder event Toileting steps completed by patient: Performs perineal  hygiene Toileting steps completed by helper: Adjust clothing prior to toileting, Adjust clothing after toileting Toileting Assistive Devices: Grab bar or rail  Toileting assist Assist level: Two helpers   Transfers Chair/bed transfer   Chair/bed transfer method: Other Chair/bed transfer assist level: Moderate assist (Pt 50 - 74%/lift or lower) Chair/bed transfer assistive device: Mechanical lift Mechanical lift: Stedy   Locomotion Ambulation Ambulation activity did not occur: Safety/medical concerns   Max distance: 10' Assist level: Total assist (Pt < 25%)   Wheelchair   Type: Manual Max wheelchair distance: 200 Assist Level: Supervision or verbal cues  Cognition Comprehension Comprehension assist level: Follows complex conversation/direction with no assist  Expression Expression assist level: Expresses complex ideas: With no assist  Social Interaction Social Interaction assist level: Interacts appropriately with others - No medications needed.  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 DVT Prophylaxis/Anticoagulation: Mechanical:Sequential compression devices, below kneeBilateral lower extremities 3. Pain Management:On celebrex bid with decadron every 8 hours--           -decreased meds per below 4. Mood:team to provide ego support. LCSW to follow for evaluation and support.  -sl increase trazodone for insomnia  -encouraged him to use later if needed. 5. Neuropsych: This patientiscapable of making decisions on hisown behalf. 6. Skin/Wound Care:monitor wound for healing. Add protein supplement to promote wound healing. 7. Fluids/Electrolytes/Nutrition:BUN trending down  43--42--40 7/19  - continue to push fluids.   - potassium elevated (5.3), 15g kayexalate ---> 4.4 today  -decreased celebrex to qd  -recheck labs Monday 8. Urinary retention/BPH/neurogenic bladder:H/o hypospadias.  -Retention has resolved 9. HTN: Monitor BP bid--  and lisinopril. HCTZ stopped Vitals:   06/30/18 0557 06/30/18 0824  BP: 135/80 115/66  Pulse: (!) 53   Resp: 18   Temp: 97.7 F (36.5 C)   SpO2: 99%   Blood pressure well controlled 7/20/ 2019 10. Prediabetes: CM diet. 11. GERD:  continue PPI due to h/o of gastritis and now with celebrex and decadron on board. 12. Leukocytosis; Likely steroid induced, 14.5K on 06/29/2018  -monitor  -tapering steroids, decreased decadron to q12--taper further Monday 7/22 13. Neurogenic bowel: advanced regminen with results    LOS (Days) [place] EVALUATION WAS PERFORMED  Charlett Blake, MD 06/30/2018 11:28 AM

## 2018-07-01 ENCOUNTER — Inpatient Hospital Stay (HOSPITAL_COMMUNITY): Payer: Medicare Other

## 2018-07-01 MED ORDER — DEXAMETHASONE 2 MG PO TABS
2.0000 mg | ORAL_TABLET | Freq: Three times a day (TID) | ORAL | Status: DC
  Administered 2018-07-01 – 2018-07-04 (×9): 2 mg via ORAL
  Filled 2018-07-01 (×8): qty 1

## 2018-07-01 NOTE — Progress Notes (Signed)
Physical Therapy Session Note  Patient Details  Name: Trevor [name] MRN: [id number] Date of Birth: 12-09-1946  Today's Date: 07/01/2018 PT Individual Time: 0845-1000 PT Individual Time Calculation (min): 75 min   Short Term Goals: Week 2:  PT Short Term Goal 1 (Week 2): pt will perform functional transfers with supervision PT Short Term Goal 2 (Week 2): pt will perform sit <> stand with max A with RW PT Short Term Goal 3 (Week 2): pt will be supervision with w/c parts management  Skilled Therapeutic Interventions/Progress Updates:    w/c propulsion for functional strengthening and endurance to/from therapy mod I. Performed w/c -> mat transfers with slideboard with set-up assist only for getting slideboard out but pt able to place it and completed at supervision level including w/c parts management with 1 cue for removal of R legrest. NMR using Maxi sky for body weight support and safety for gait training, lateral sidestepping, and standing strengthening exercises. Pt able to gait x 30' x 2 trials, sidestepping 15' each direction with support of maxi sky and noted knee buckling with sling supporting pt when this occurs and heavy reliance on UE's. Pt with poor eccentric control when returning to seated position requiring total assist. In standing completed heel raises x 20, marches x 20, and attempts for mini squats but unable to perform without legs giving out but then able to attempt with just relaxing at knees and straightening back up x 20 reps.Pt able to perform sit <> stands from elevated surface with mod assist and cues for hand placement, total assist from normal seated position on mat. Stand pivot transfer with maxi sky and RW back to w/c at end of session.   Therapy Documentation Precautions:  Precautions Precautions: Fall Precaution Comments: spinal precautions: no bending, lifting, twisting, arching Restrictions Weight Bearing Restrictions: No Other Position/Activity Restrictions: No  brace needed per orders Pain: Denies pain.      See Function Navigator for Current Functional Status.   Therapy/Group: Individual Therapy  Canary Brim Ivory Broad, PT, DPT  07/01/2018, 10:02 AM

## 2018-07-01 NOTE — Progress Notes (Signed)
[place] PHYSICAL MEDICINE & REHABILITATION     PROGRESS NOTE    Subjective/Complaints: Seen in gym working with light gait on side to side stepping  ROS: Denies chest pain, shortness of breath, nausea vomiting diarrhea constipation Objective:  No results found. Recent Labs    06/29/18 0543  WBC 14.5*  HGB 13.5  HCT 40.3  PLT 197   Recent Labs    06/29/18 0543  NA 133*  K 4.4  CL 98  GLUCOSE 138*  BUN 40*  CREATININE 0.91  CALCIUM 8.6*   CBG (last 3)  No results for input(s): GLUCAP in the last 72 hours.  Wt Readings from Last 3 Encounters:  06/22/18 82.7 kg (182 lb 4.8 oz)  06/22/18 81.6 kg (180 lb)  06/18/18 81.6 kg (180 lb)     Intake/Output Summary (Last 24 hours) at 07/01/2018 1052 Last data filed at 07/01/2018 0700 Gross per 24 hour  Intake 920 ml  Output -  Net 920 ml    Vital Signs: Blood pressure 118/69, pulse (!) 54, temperature 97.9 F (36.6 C), temperature source Oral, resp. rate 18, height 5\' 8"  (1.727 m), weight 82.7 kg (182 lb 4.8 oz), SpO2 100 %. Physical Exam:  Constitutional: No distress . Vital signs reviewed. HEENT: EOMI, oral membranes moist Neck: supple Cardiovascular: RRR without murmur. No JVD    Respiratory: CTA Bilaterally without wheezes or rales. Normal effort    GI: BS +, non-tender, non-distended  Musculoskeletal: He exhibits noedemaor tenderness. Left shin abrasionhealed Neurological: Speech clear. Able to follow basic commands without difficulty.UE 5/5. HAB  4/5.  Hip adduction 2/5 ADF 4/5. APF 4+/5. HF 2/5. KE 2-3/5.--stable exam Proprioceptive deficits in legs. Skin:No rashnoted.  Surgical incision CDI Psych: pleasant and appropriate    Assessment/Plan: 1. 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 Physiatrist is providing close team supervision and 24 hour management of active  medical problems listed below. Physiatrist and rehab team continue to assess barriers to discharge/monitor patient progress toward functional and medical goals.  Function:  Bathing Bathing position   Position: Shower  Bathing parts Body parts bathed by patient: Left arm, Right arm, Abdomen, Chest, Front perineal area, Buttocks, Right upper leg, Left upper leg, Right lower leg, Left lower leg, Back Body parts bathed by helper: Back, Buttocks  Bathing assist Assist Level: Supervision or verbal cues Assistive Device Comment: LH sponge    Upper Body Dressing/Undressing Upper body dressing   What is the patient wearing?: Pull over shirt/dress     Pull over shirt/dress - Perfomed by patient: Thread/unthread right sleeve, Thread/unthread left sleeve, Put head through opening, Pull shirt over trunk          Upper body assist Assist Level: Set up      Lower Body Dressing/Undressing Lower body dressing   What is the patient wearing?: Pants, Non-skid slipper socks, Ted Hose     Pants- Performed by patient: Thread/unthread right pants leg, Thread/unthread left pants leg Pants- Performed by helper: Pull pants up/down Non-skid slipper socks- Performed by patient: Don/doff left sock, Don/doff right sock Non-skid slipper socks- Performed by helper: Don/doff right sock, Don/doff left sock     Shoes - Performed by patient: Don/doff right shoe, Don/doff left shoe, Fasten right, Fasten left       TED Hose - Performed by patient: Don/doff right TED hose, Don/doff left TED hose TED Hose - Performed by helper: Don/doff  left TED hose, Don/doff right TED hose  Lower body assist Assist for lower body dressing: Touching or steadying assistance (Pt > 75%)      Toileting Toileting Toileting activity did not occur: No continent bowel/bladder event Toileting steps completed by patient: Performs perineal hygiene Toileting steps completed by helper: Adjust clothing prior to toileting, Adjust clothing  after toileting Toileting Assistive Devices: Grab bar or rail  Toileting assist Assist level: Touching or steadying assistance (Pt.75%)   Transfers Chair/bed transfer   Chair/bed transfer method: Lateral scoot Chair/bed transfer assist level: Supervision or verbal cues Chair/bed transfer assistive device: Sliding board, Armrests Mechanical lift: Stedy   Locomotion Ambulation Ambulation activity did not occur: Safety/medical concerns   Max distance: 30' Assist level: Total assist (Pt < 25%)   Wheelchair   Type: Manual Max wheelchair distance: 150' Assist Level: No help, No cues, assistive device, takes more than reasonable amount of time  Cognition Comprehension Comprehension assist level: Follows complex conversation/direction with no assist  Expression Expression assist level: Expresses complex ideas: With no assist  Social Interaction Social Interaction assist level: Interacts appropriately with others - No medications needed.  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 Pt with conus medullaris/cauda equina syndrome, most of his weakness is actually at the L2-L3 level -continue therapies, PT OT  2. DVT Prophylaxis/Anticoagulation: Mechanical:Sequential compression devices, below kneeBilateral lower extremities 3. Pain Management:On celebrex bid with decadron every 8 hours--           -decreased meds per below 4. Mood:team to provide ego support. LCSW to follow for evaluation and support.  -sl increase trazodone for insomnia  -encouraged him to use later if needed. 5. Neuropsych: This patientiscapable of making decisions on hisown behalf. 6. Skin/Wound Care:monitor wound for healing. Add protein supplement to promote wound healing. 7.  Fluids/Electrolytes/Nutrition:BUN trending down 43--42--40 7/19  - continue to push fluids.   - potassium elevated (5.3), 15g kayexalate ---> 4.4 today  -decreased celebrex to qd  -recheck labs Monday 8. Urinary retention/BPH/neurogenic bladder:H/o hypospadias.  -Retention has resolved 9. HTN: Monitor BP bid--  and lisinopril. HCTZ stopped Vitals:   07/01/18 0541 07/01/18 0726  BP: (!) 111/58 118/69  Pulse: (!) 54   Resp: 18   Temp: 97.9 F (36.6 C)   SpO2: 100%   Blood pressure well controlled 7/21/ 2019 10. Prediabetes: CM diet. 11. GERD:  continue PPI due to h/o of gastritis and now with celebrex and decadron on board. 12. Leukocytosis; Likely steroid induced, 14.5K on 06/29/2018  -monitor  -tapering steroids, decreased decadron to q12--taper further Sunday 7/21 13. Neurogenic bowel: advanced regminen with results    LOS (Days) [place] E Lucas Winograd, MD 07/01/2018 10:52 AM

## 2018-07-02 ENCOUNTER — Inpatient Hospital Stay (HOSPITAL_COMMUNITY): Payer: Medicare Other | Admitting: Physical Therapy

## 2018-07-02 ENCOUNTER — Inpatient Hospital Stay (HOSPITAL_COMMUNITY): Payer: Medicare Other | Admitting: Occupational Therapy

## 2018-07-02 DIAGNOSIS — R739 Hyperglycemia, unspecified: Secondary | ICD-10-CM

## 2018-07-02 DIAGNOSIS — T380X5A Adverse effect of glucocorticoids and synthetic analogues, initial encounter: Secondary | ICD-10-CM

## 2018-07-02 DIAGNOSIS — I10 Essential (primary) hypertension: Secondary | ICD-10-CM

## 2018-07-02 DIAGNOSIS — G479 Sleep disorder, unspecified: Secondary | ICD-10-CM

## 2018-07-02 DIAGNOSIS — D72829 Elevated white blood cell count, unspecified: Secondary | ICD-10-CM

## 2018-07-02 DIAGNOSIS — R7303 Prediabetes: Secondary | ICD-10-CM

## 2018-07-02 LAB — BASIC METABOLIC PANEL
Anion gap: 9 (ref 5–15)
BUN: 29 mg/dL — ABNORMAL HIGH (ref 8–23)
CALCIUM: 9 mg/dL (ref 8.9–10.3)
CO2: 29 mmol/L (ref 22–32)
Chloride: 97 mmol/L — ABNORMAL LOW (ref 98–111)
Creatinine, Ser: 0.89 mg/dL (ref 0.61–1.24)
GFR calc Af Amer: >60 mL/min (ref >60)
Glucose, Bld: 123 mg/dL — ABNORMAL HIGH (ref 70–99)
Potassium: 4.4 mmol/L (ref 3.5–5.1)
SODIUM: 135 mmol/L (ref 135–145)

## 2018-07-02 NOTE — Progress Notes (Signed)
Physical Therapy Session Note  Patient Details  Name: Trevor [name] MRN: [id number] Date of Birth: 1946-04-12  Today's Date: 07/02/2018 PT Individual Time: 8416-6063 PT Individual Time Calculation (min): 57 min   Short Term Goals: Week 2:  PT Short Term Goal 1 (Week 2): pt will perform functional transfers with supervision PT Short Term Goal 2 (Week 2): pt will perform sit <> stand with max A with RW PT Short Term Goal 3 (Week 2): pt will be supervision with w/c parts management  Skilled Therapeutic Interventions/Progress Updates:    Pt performed w/c mobility and parts management with supervision throughout session.  Standing in parallel bars for LE strengthening with pt able to stand with min A by pulling on parallel bars, standing tolerance with wt shifts with min A. Pt able to stand 4 x 1 minute without UE support in parallel bars with min A, cues for glute activation to promote upright posture.  Nustep for continued LE strengthening 2 x 4 minutes level 4.  Sliding board transfers to/from nustep with set up assist only. Pt requests to do laundry. Pt able to propel w/c with laundry bag in lap and able to put laundry in washer with supervision. Pt requires assist to open washer and push buttons due to fatigue and unable to stand at end of session. Pt left in room with needs at hand, alarm set.  Therapy Documentation Precautions:  Precautions Precautions: Fall Precaution Comments: spinal precautions: no bending, lifting, twisting, arching Restrictions Weight Bearing Restrictions: No Other Position/Activity Restrictions: No brace needed per orders Pain: Pain Assessment Pain Scale: 0-10 Pain Score: 0-No pain  Therapy/Group: Individual Therapy  Piper Hassebrock 07/02/2018, 9:45 AM

## 2018-07-02 NOTE — Progress Notes (Signed)
Occupational Therapy Session Note  Patient Details  Name: Trevor [name] MRN: [id number] Date of Birth: 1946/02/28  Today's Date: 07/02/2018 OT Individual Time: 1002-1030 OT Individual Time Calculation (min): 28 min   Short Term Goals: Week 2:  OT Short Term Goal 1 (Week 2): Pt will complete toilet/BSC transfer with no more than Mod A OT Short Term Goal 2 (Week 2): Pt will utilize leaning method in shower to wash buttocks OT Short Term Goal 3 (Week 2): Pt will complete 2 grooming tasks in supported standing  OT Short Term Goal 4 (Week 2): Pt will follow back precautions within BADL tasks with no more than min questioning cues     Skilled Therapeutic Interventions/Progress Updates:    Pt greeted in w/c with no c/o pain. Tx focus placed on pressure relief education. Had him lean Lt<Rt onto bed and chair using props to increase comfort. Pt able to clear buttocks on both sides with instruction. He reported feeling immediate relief from buttocks and held positions for 3-4 minutes. Discussed sleep hygiene strategies to implement to improve sleep quality at this time. Also educated pt on w/c push ups for tricep strengthening, LE weightbearing, and pressure relief. He was able to complete 10 reps for 10 second holds without pain. Discussed completing pressure relief techniques throughout the day for skin integrity purposes. Pt reported gratefulness regarding education. At end of session pt was left with all needs within reach.   Therapy Documentation Precautions:  Precautions Precautions: Fall Precaution Comments: spinal precautions: no bending, lifting, twisting, arching Restrictions Weight Bearing Restrictions: No Other Position/Activity Restrictions: No brace needed per orders Pain: No c/o pain during tx    ADL: ADL ADL Comments: Please see functional navigator for ADL status     See Function Navigator for Current Functional Status.   Therapy/Group: Individual Therapy  Lan Entsminger A  Tamra Koos 07/02/2018, 12:29 PM

## 2018-07-02 NOTE — Progress Notes (Signed)
Occupational Therapy Session Note  Patient Details  Name: Trevor [name] MRN: [id number] Date of Birth: Jul 28, 1946  Today's Date: 07/02/2018 OT Individual Time: 1002-1030 OT Individual Time Calculation (min): 28 min    Short Term Goals: Week 2:  OT Short Term Goal 1 (Week 2): Pt will complete toilet/BSC transfer with no more than Mod A OT Short Term Goal 2 (Week 2): Pt will utilize leaning method in shower to wash buttocks OT Short Term Goal 3 (Week 2): Pt will complete 2 grooming tasks in supported standing  OT Short Term Goal 4 (Week 2): Pt will follow back precautions within BADL tasks with no more than min questioning cues  Skilled Therapeutic Interventions/Progress Updates:    Upon entering the room, pt seated in wheelchair awaiting therapist arrival. Pt with no c/o pain this session and agreeable to OT intervention. Pt requesting to shower and stood into STEDY with min A from wheelchair. OT assisted pt into bathroom and transferred onto TTB via STEDY with min A. Pt bathing self with supervision and LH sponge to increase independence with self care. Pt performed lateral leans to wash buttocks and peri area from seated position. Pt exiting the shower in same manner and returning to wheelchair. Pt utilizing figure four position to thread clothing onto B feet and socks as well. Pt standing into STEDY with min A for standing balance while he pulled pants over B hips. Pt seated in wheelchair at sink for grooming tasks at mod I level. Pt propelled wheelchair 75' to laundry room with supervision and pt utilized reacher to place items from washing machine into dryer while maintaining precautions. Pt propelled wheelchair back to room and set up lunch tray as therapist exited the room. Call bell and all needed items within reach upon exiting the room.   Therapy Documentation Precautions:  Precautions Precautions: Fall Precaution Comments: spinal precautions: no bending, lifting, twisting,  arching Restrictions Weight Bearing Restrictions: No Other Position/Activity Restrictions: No brace needed per orders ADL: ADL ADL Comments: Please see functional navigator for ADL status  See Function Navigator for Current Functional Status.   Therapy/Group: Individual Therapy  Gypsy Decant 07/02/2018, 12:31 PM

## 2018-07-02 NOTE — Progress Notes (Signed)
[facility]     PROGRESS NOTE    Subjective/Complaints: Patient seen sitting up in his chair this morning. He states he did not sleep well overnight, but  Does not sleep well in the hospital. He states he had a good weekend. He has not had a bowel movement in 2 days but notes that this is sometimes common for him.  ROS: denies CP, SOB, nausea, vomiting, diarrhea.  Objective:  No results found. No results for input(s): WBC, HGB, HCT, PLT in the last 72 hours. Recent Labs    07/02/18 0636  NA 135  K 4.4  CL 97*  GLUCOSE 123*  BUN 29*  CREATININE 0.89  CALCIUM 9.0   CBG (last 3)  No results for input(s): GLUCAP in the last 72 hours.  Wt Readings from Last 3 Encounters:  06/22/18 82.7 kg (182 lb 4.8 oz)  06/22/18 81.6 kg (180 lb)  06/18/18 81.6 kg (180 lb)     Intake/Output Summary (Last 24 hours) at 07/02/2018 0951 Last data filed at 07/02/2018 0700 Gross per 24 hour  Intake 684 ml  Output -  Net 684 ml    Vital Signs: Blood pressure 121/74, pulse (!) 53, temperature 97.8 F (36.6 C), temperature source Oral, resp. rate 17, height 5\' 8"  (1.727 m), weight 82.7 kg (182 lb 4.8 oz), SpO2 100 %. Physical Exam:  Constitutional: No distress . Vital signs reviewed. HENT: Normocephalic.  Atraumatic. Eyes: EOMI. No discharge. Cardiovascular: RRR. No JVD. Respiratory: CTA Bilaterally. Normal effort. GI: BS +. Non-distended. Musc: No edema or tenderness in extremities. Neurological:alert Speech clear.  Able to follow basic commands without difficulty. Motor: B/l 5/5.  B/l LE: Hip flexion 2/5, knee extension 3+/5, ankle dorsiflexion 4 -/5 (right stronger than left) Sensation intact light touch Skin:No rashnoted. Surgical incision CDI Psych: pleasant and appropriate  Assessment/Plan: 1. 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 Physiatrist is providing close team supervision and 24 hour management of active medical problems listed below. Physiatrist and rehab team continue to assess barriers to discharge/monitor patient progress toward functional and medical goals.  Function:  Bathing Bathing position   Position: Shower  Bathing parts Body parts bathed by patient: Left arm, Right arm, Abdomen, Chest, Front perineal area, Buttocks, Right upper leg, Left upper leg, Right lower leg, Left lower leg, Back Body parts bathed by helper: Back, Buttocks  Bathing assist Assist Level: Supervision or verbal cues Assistive Device Comment: LH sponge    Upper Body Dressing/Undressing Upper body dressing   What is the patient wearing?: Pull over shirt/dress     Pull over shirt/dress - Perfomed by patient: Thread/unthread right sleeve, Thread/unthread left sleeve, Put head through opening, Pull shirt over trunk          Upper body assist Assist Level: Set up      Lower Body Dressing/Undressing Lower body dressing   What is the patient wearing?: Pants, Non-skid slipper socks, Ted Hose     Pants- Performed by patient: Thread/unthread right pants leg, Thread/unthread left pants leg Pants- Performed by helper: Pull pants up/down Non-skid slipper socks- Performed by patient: Don/doff left sock, Don/doff right sock Non-skid slipper socks- Performed by helper: Don/doff right sock, Don/doff left sock     Shoes - Performed by patient: Don/doff right shoe, Don/doff left shoe, Fasten right, Fasten left       TED Hose - Performed by  patient: Don/doff right TED hose, Don/doff left TED hose TED Hose - Performed by helper: Don/doff left TED hose, Don/doff right TED hose  Lower body assist Assist for lower body dressing: Touching or steadying assistance (Pt > 75%)      Toileting Toileting Toileting activity did not occur: No continent bowel/bladder event Toileting steps completed by patient: Adjust clothing  prior to toileting, Performs perineal hygiene, Adjust clothing after toileting Toileting steps completed by helper: Performs perineal hygiene Toileting Assistive Devices: Grab bar or rail  Toileting assist Assist level: Touching or steadying assistance (Pt.75%)   Transfers Chair/bed transfer   Chair/bed transfer method: Lateral scoot Chair/bed transfer assist level: Supervision or verbal cues Chair/bed transfer assistive device: Sliding board, Armrests Mechanical lift: Stedy   Locomotion Ambulation Ambulation activity did not occur: Safety/medical concerns   Max distance: 30' Assist level: Total assist (Pt < 25%)   Wheelchair   Type: Manual Max wheelchair distance: 150' Assist Level: No help, No cues, assistive device, takes more than reasonable amount of time  Cognition Comprehension Comprehension assist level: Follows complex conversation/direction with no assist  Expression Expression assist level: Expresses complex ideas: With no assist  Social Interaction Social Interaction assist level: Interacts appropriately with others - No medications needed.  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   Cont CIR  Notes reviewed - falls due to herniated lumbar disc, images reviewed-L2-L3 herniated disc, labs reviewed 2. DVT Prophylaxis/Anticoagulation: Mechanical:Sequential compression devices, below kneeBilateral lower extremities 3. Pain Management:Celebrex d/ced   Decadron taper, last decreased on 7/21  Decreased meds per below 4. Mood:team to provide ego support. LCSW to follow for evaluation and support.  -sl increase trazodone for sleep disturbance  -encouraged him to use later if needed. 5. Neuropsych: This patientiscapable of making decisions on hisown behalf. 6.  Skin/Wound Care:monitor wound for healing. Added protein supplement to promote wound healing. 7. Fluids/Electrolytes/Nutrition:  -continue to push fluids.   -potassium 4.4 on 7/22  -decreased celebrex to qd 8. Urinary retention/BPH/neurogenic bladder:H/o hypospadias.  -Retention has resolved 9. HTN: Monitor BP bid--  and lisinopril. HCTZ stopped Vitals:   07/02/18 0540 07/02/18 0759  BP: 116/65 121/74  Pulse: (!) 53   Resp: 17   Temp: 97.8 F (36.6 C)   SpO2: 100%    Controlled on 7/22 10. Steroid induced hypoglycemia on Prediabetes: CM diet. 11. GERD:  continue PPI due to h/o of gastritis and now decadron on board. 12. Leukocytosis; Likely steroid induced,   WBCs 14.5K on 06/29/2018  Continue to monitor 13. Neurogenic bowel: advanced regminen with results  LOS (Days) 10 A FACE TO FACE EVALUATION WAS PERFORMED  Vieva Brummitt Lorie Phenix, MD 07/02/2018 9:51 AM

## 2018-07-02 NOTE — Progress Notes (Signed)
Occupational Therapy Session Note  Patient Details  Name: Trevor [name] MRN: [id number] Date of Birth: 07-31-1946  Today's Date: 07/02/2018 OT Individual Time: 1400-1500 OT Individual Time Calculation (min): 60 min    Short Term Goals: Week 2:  OT Short Term Goal 1 (Week 2): Pt will complete toilet/BSC transfer with no more than Mod A OT Short Term Goal 2 (Week 2): Pt will utilize leaning method in shower to wash buttocks OT Short Term Goal 3 (Week 2): Pt will complete 2 grooming tasks in supported standing  OT Short Term Goal 4 (Week 2): Pt will follow back precautions within BADL tasks with no more than min questioning cues  Skilled Therapeutic Interventions/Progress Updates:    Pt seen for OT session focusing on functional transfers, mobility, and LE strengthening/coordination. Pt in supine upon arrival, voiced increased fatigue from previous sessions but willing to attempt therapy as able. He transferred to EOB with supervision using hospital bed functions. Sliding board transfer to w/c with assist to place board.  He self propelled w/c throughout unit mod I.  Addressed functional transfers introducing squat pivot. Pt able to complete with min A- occasional mod during 4 attempts throughout session though with decreased clearance of buttock during transfer. Education provided regarding importance of maintaining skin integrity with importance of clearance during transfers.  Partial stands from elevated EOM, B UE support on arm rests of standard chair placed in front of him with max A to clear buttock from mat.  Lateral scoots  R<>L across mat to promote head/hip relationship, anterior weight shift and LE WBing/ strengthening. Seated EOM, Towel pushes with R LE, able to complete flex and extend using R LE, however, unable to extend with L LE, able to flex however. Completed x15 on R and x8 with assist on L.  Pt returned to w/c at end of session, returned to room and left seated in w/c with all  needs in reach.   Therapy Documentation Precautions:  Precautions Precautions: Fall Precaution Comments: spinal precautions: no bending, lifting, twisting, arching Restrictions Weight Bearing Restrictions: No Other Position/Activity Restrictions: No brace needed per orders Pain:   No/denies pain ADL: ADL ADL Comments: Please see functional navigator for ADL status  See Function Navigator for Current Functional Status.   Therapy/Group: Individual Therapy  Devery Murgia L 07/02/2018, 7:23 AM

## 2018-07-03 ENCOUNTER — Inpatient Hospital Stay (HOSPITAL_COMMUNITY): Payer: Medicare Other | Admitting: Occupational Therapy

## 2018-07-03 ENCOUNTER — Inpatient Hospital Stay (HOSPITAL_COMMUNITY): Payer: Medicare Other

## 2018-07-03 ENCOUNTER — Inpatient Hospital Stay (HOSPITAL_COMMUNITY): Payer: Medicare Other | Admitting: Physical Therapy

## 2018-07-03 NOTE — Progress Notes (Signed)
Physical Therapy Session Note  Patient Details  Name: Trevor [name] MRN: [id number] Date of Birth: 1946/07/07  Today's Date: 07/03/2018 PT Individual Time: 1000-1100 PT Individual Time Calculation (min): 60 min   Short Term Goals: Week 2:  PT Short Term Goal 1 (Week 2): pt will perform functional transfers with supervision PT Short Term Goal 2 (Week 2): pt will perform sit <> stand with max A with RW PT Short Term Goal 3 (Week 2): pt will be supervision with w/c parts management  Skilled Therapeutic Interventions/Progress Updates: Pt received seated in w/c, denies pain and agreeable to treatment. W/c propulsion to/from gym modI with BUE. Sit <>stand in parallel bars minA, however heavy reliance on UEs. Donned ace wraps to B knees to improve proprioception and reduce buckling; pt reports moderate improvement throughout gait trials with ace wraps compared to without. Pre-gait forward/backward steps in parallel bars, x2 steps progressed to 10' forward/backwards in bars with min guard overall, tactile cues for glute activation/hip extension in stance. Educated pt regarding importance of maintaining glute activation to facilitate normalized gait mechanics, despite position increasing knee flexion moment and increased likelihood of buckling; therapist will assist as needed to maintain safety.  Pre-gait at hall rail to reduce reliance on UEs; again performed forward/backward steps in place with minA overall, rare buckling requiring mod/maxA. Forward gait x30' with R hall rail, minA overall except when buckling requiring up to maxA, w/c follow for safety. Squat pivot w/c <>mat table with S. Sit<>supine with S, increased time and use of UEs to maneuver LEs. Pt requesting printed HEP to perform independently later in the day. Unable to perform both long and short arc quads BLE; performed quad sets as simplified version d/t strength/NM deficits. Glute bridges on bolster, hip abduction/adduction and heel slides with  maxislide to reduce resistance; all performed 3x10-15 reps. Provided handout with pictures of exercises. Educated pt re: reps/sets for NMR vs general strengthening and goal of continuing reps to fatigue or form failure. Returned to w/c as above; w/c propulsion to room modI. Remained seated in w/c, all needs in reach.      Therapy Documentation Precautions:  Precautions Precautions: Fall Precaution Comments: spinal precautions: no bending, lifting, twisting, arching Restrictions Weight Bearing Restrictions: No Other Position/Activity Restrictions: No brace needed per orders   See Function Navigator for Current Functional Status.   Therapy/Group: Individual Therapy  Corliss Skains 07/03/2018, 11:27 AM

## 2018-07-03 NOTE — Progress Notes (Signed)
Occupational Therapy Session Note  Patient Details  Name: Trevor [name] MRN: [id number] Date of Birth: Jan 28, 1946  Today's Date: 07/03/2018 OT Individual Time: 1100-1156 OT Individual Time Calculation (min): 56 min   Short Term Goals: Week 2:  OT Short Term Goal 1 (Week 2): Pt will complete toilet/BSC transfer with no more than Mod A OT Short Term Goal 2 (Week 2): Pt will utilize leaning method in shower to wash buttocks OT Short Term Goal 3 (Week 2): Pt will complete 2 grooming tasks in supported standing  OT Short Term Goal 4 (Week 2): Pt will follow back precautions within BADL tasks with no more than min questioning cues  Skilled Therapeutic Interventions/Progress Updates:    Pt greeted sitting in wc after finishing 2 recent PT sessions. Discussed with pt on progressing transfers and working on squat-pivots to get in/out of shower. Pt stated he was too tired to work on squat-pivots today and requested to use the Stedy to transfer in/out of shower. Bathing completed at overall supervision level. Pt propelled wc to dresser and picked out clothing for the day. UB dressing completed without assistance. Pt needed min A to place R LE into figure 4 position. Sit<>stand at the sink with OT providing knee block to L side and verbal cues for hand placement to stand with mod A. Pt then able to manage clothing while OT stayed on his knee. Pt propelled wc to therapy gym and completed toe taps on small cones with assistance for hip flexion on L side. Pt then brought to outside rail of parallel bars and completed 5 sit<>stands with focus on pushing up with one hand on arm rests of wc. With OT blocking L knee, he only needed min A for 3/5 stands. Pt returned to room and set-up with lunch tray.   Therapy Documentation Precautions:  Precautions Precautions: Fall Precaution Comments: spinal precautions: no bending, lifting, twisting, arching Restrictions Weight Bearing Restrictions: No Other  Position/Activity Restrictions: No brace needed per orders Pain:  none/denies pain ADL: ADL ADL Comments: Please see functional navigator for ADL status  See Function Navigator for Current Functional Status.   Therapy/Group: Individual Therapy  Valma Cava 07/03/2018, 11:17 AM

## 2018-07-03 NOTE — Patient Care Conference (Signed)
Inpatient RehabilitationTeam Conference and Plan of Care Update Date: 07/03/2018   Time: 10:45 AM    Patient Name: Trevor [name]      Medical Record Number: [id number]  Date of Birth: September 09, 1946 Sex: Male         Room/Bed: 4W11C/4W11C-01 Payor Info: Payor: [place] / Plan: [facility] MEDICARE / Product Type: *No Product type* /    Admitting Diagnosis: Lumbar Radic  Admit Date/Time:  06/22/2018  6:15 PM Admission Comments: No comment available   Primary Diagnosis:  <principal problem not specified> Principal Problem: <principal problem not specified>  Patient Active Problem List   Diagnosis Date Noted  . Sleep disturbance   . Essential hypertension   . Steroid-induced hyperglycemia   . Prediabetes   . Leukocytosis   . Herniation of lumbar intervertebral disc with radiculopathy 06/22/2018  . Lumbar radiculopathy 06/20/2018  . Nocturia 10/23/2015  . Absolute anemia 05/13/2014  . Benign essential HTN 05/13/2014  . Blood glucose elevated 05/13/2014  . LBP (low back pain) 05/13/2014  . HLD (hyperlipidemia) 05/13/2014  . 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 Benign prostatic hypertrophy without urinary obstruction 12/10/2012  . Hypospadias 12/10/2012  . Microscopic hematuria 12/10/2012  . History of neoplasm of bladder 12/10/2012  . FOM (frequency of micturition) 12/10/2012    Expected Discharge Date: Expected Discharge Date: 07/13/18  Team Members Present: Physician leading conference: Dr. 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  Imporve mobility, endurance  See above   Bowel/Bladder   continent of bowel and bladder, LBM  07/02/2018   pt to remain continent of B/B with normal bowel function  assit with toileting and admin laxatives as needed    Swallow/Nutrition/ Hydration             ADL's   MinA/supervision SB transfers, Min A stand in [place], [place] A BADLs  Supervision/Mod I  LB strengtening, modified bathing/dressing, back precautions, dc planning, sit<>stand   Mobility   supervision transfers and w/c mobility including parts management  supervision overall w/c level  strength, activity tolerance, d/c planning   Communication             Safety/Cognition/ Behavioral Observations            Pain   pt pain managed with current plan of care pt has oxycodone 5-10 mg & Robaxin PRN   Pts pain was 6/10   assess patient for pain q shift and medicate PRN as directed    Skin   Pt has an ecchymosis to left upper extremity, right groin/buttock excoriation   Pt will remain free from any further breakdowns; above issues will be free from infection   assess patient q shift and PRN     Rehab Goals Patient on target to meet rehab goals: Yes Rehab Goals Revised: none *See Care Plan and progress notes for long and short-term goals.     Barriers to Discharge  Current Status/Progress Possible Resolutions Date Resolved   Physician    Medical stability     See above  Therapies, steroid taper, follow BP, follow labs      Nursing  PT                    OT                  SLP                SW                Discharge Planning/Teaching Needs:  Pt has options to d/c to his own home vs to his brother's home (more accessible) dependent on assistance needs.  Pt will need ramp at either location.  Teaching needs TBD based on where he decides to d/c   Team Discussion:  MD is watching pt's blood pressure.  Pt is on steroid taper over the next 2 weeks.  Pt has a dressing to his back.  OT stated that pt needs cues to remember to use back precautions with ADLS.  He can use modifications and is min A to S  with slide board, still using stedy some.  PT is starting squat pivot transfers and pt is getting stronger.  He will still need a slide board for car transfers.  Pt is doing will with w/c and will d/c at w/c level due to standing and walking will not be at functional level by d/c.  Pt wants to go home, but will be alone and this is not our recommendation.  He will have his sister-in-law if he goes to his brother's home.  Revisions to Treatment Plan:  none    Continued Need for Acute Rehabilitation Level of Care: The patient requires daily medical management by a physician with specialized training in physical medicine and rehabilitation for the following conditions: Daily direction of a multidisciplinary physical rehabilitation program to ensure safe treatment while eliciting the highest outcome that is of practical value to the patient.: Yes Daily medical management of patient stability for increased activity during participation in an intensive rehabilitation regime.: Yes Daily analysis of laboratory values and/or radiology reports with any subsequent need for medication adjustment of medical intervention for : Neurological problems;Blood pressure problems;Other  Umaima Scholten, Silvestre Mesi 07/03/2018, 12:18 PM

## 2018-07-03 NOTE — Progress Notes (Signed)
Physical Therapy Session Note  Patient Details  Name: Trevor [name] MRN: [id number] Date of Birth: 1946-07-14  Today's Date: 07/03/2018 PT Individual Time: 1415-1440 PT Individual Time Calculation (min): 25 min   Short Term Goals: Week 2:  PT Short Term Goal 1 (Week 2): pt will perform functional transfers with supervision PT Short Term Goal 2 (Week 2): pt will perform sit <> stand with max A with RW PT Short Term Goal 3 (Week 2): pt will be supervision with w/c parts management  Skilled Therapeutic Interventions/Progress Updates:    Pt reports being exhausted from therapies today. Focused on functional w/c mobility for functional mobility training, endurance/strengthening, and parts management to set up for transfers. Min assist for scoot pivot transfer w/c <> nustep with cues for increased clearance of bottom but pt unable to due to fatigue. Nustep for NMR for reciprocal movement pattern re-training and motor activation in BLE x 10 min on level 4 with BUE/BLE.  Therapy Documentation Precautions:  Precautions Precautions: Fall Precaution Comments: spinal precautions: no bending, lifting, twisting, arching Restrictions Weight Bearing Restrictions: No Other Position/Activity Restrictions: No brace needed per orders Pain:  Denies pain. Just reports some general soreness today from activity and fatigue.   See Function Navigator for Current Functional Status.   Therapy/Group: Individual Therapy  Canary Brim Ivory Broad, PT, DPT  07/03/2018, 2:59 PM

## 2018-07-03 NOTE — Progress Notes (Signed)
[facility]IL[facility]     PROGRESS NOTE    Subjective/Complaints: Patient seen sitting up in his chair grooming this morning. He states he slept much better last night.  ROS: denies CP, SOB, nausea, vomiting, diarrhea.  Objective:  No results found. No results for input(s): WBC, HGB, HCT, PLT in the last 72 hours. Recent Labs    07/02/18 0636  NA 135  K 4.4  CL 97*  GLUCOSE 123*  BUN 29*  CREATININE 0.89  CALCIUM 9.0   CBG (last 3)  No results for input(s): GLUCAP in the last 72 hours.  Wt Readings from Last 3 Encounters:  06/22/18 82.7 kg (182 lb 4.8 oz)  06/22/18 81.6 kg (180 lb)  06/18/18 81.6 kg (180 lb)     Intake/Output Summary (Last 24 hours) at 07/03/2018 0909 Last data filed at 07/02/2018 1700 Gross per 24 hour  Intake 240 ml  Output -  Net 240 ml    Vital Signs: Blood pressure 127/67, pulse (!) 51, temperature 97.8 F (36.6 C), temperature source Oral, resp. rate 20, height 5\' 8"  (1.727 m), weight 82.7 kg (182 lb 4.8 oz), SpO2 99 %. Physical Exam:  Constitutional: No distress . Vital signs reviewed. HENT: Normocephalic.  Atraumatic. Eyes: EOMI. No discharge. Cardiovascular: RRR. No JVD. Respiratory: CTA bilaterally. Normal effort. GI: BS +. Non-distended. Musc: No edema or tenderness in extremities. Neurological:alert Speech clear.  Able to follow basic commands without difficulty. Motor: B/l 5/5 (stable).  B/l LE: Hip flexion 2/5, knee extension 3+/5, ankle dorsiflexion 4 -/5 (right stronger than left, stable) Sensation intact light touch Skin:No rashnoted. Surgical incision CDI Psych: pleasant and appropriate  Assessment/Plan: 1. 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 Physiatrist is providing close team supervision and 24 hour management of active medical problems listed below. Physiatrist  and rehab team continue to assess barriers to discharge/monitor patient progress toward functional and medical goals.  Function:  Bathing Bathing position   Position: Shower  Bathing parts Body parts bathed by patient: Left arm, Right arm, Abdomen, Chest, Front perineal area, Buttocks, Right upper leg, Left upper leg, Right lower leg, Left lower leg, Back Body parts bathed by helper: Back, Buttocks  Bathing assist Assist Level: Supervision or verbal cues Assistive Device Comment: LH sponge    Upper Body Dressing/Undressing Upper body dressing   What is the patient wearing?: Pull over shirt/dress     Pull over shirt/dress - Perfomed by patient: Thread/unthread right sleeve, Thread/unthread left sleeve, Put head through opening, Pull shirt over trunk          Upper body assist Assist Level: Set up   Set up : To obtain clothing/put away  Lower Body Dressing/Undressing Lower body dressing   What is the patient wearing?: Pants, Non-skid slipper socks     Pants- Performed by patient: Thread/unthread right pants leg, Thread/unthread left pants leg, Pull pants up/down Pants- Performed by helper: Pull pants up/down Non-skid slipper socks- Performed by patient: Don/doff left sock, Don/doff right sock Non-skid slipper socks- Performed by helper: Don/doff right sock, Don/doff left sock     Shoes - Performed by patient: Don/doff right shoe, Don/doff left shoe, Fasten right, Fasten left       TED Hose - Performed by patient: Don/doff right TED hose, Don/doff left TED hose TED Hose - Performed by helper: Don/doff left TED hose, Don/doff right TED hose  Lower body  assist Assist for lower body dressing: Touching or steadying assistance (Pt > 75%)      Toileting Toileting Toileting activity did not occur: No continent bowel/bladder event Toileting steps completed by patient: Adjust clothing prior to toileting, Performs perineal hygiene, Adjust clothing after toileting Toileting steps  completed by helper: Performs perineal hygiene Toileting Assistive Devices: Grab bar or rail  Toileting assist Assist level: Supervision or verbal cues   Transfers Chair/bed transfer   Chair/bed transfer method: Lateral scoot Chair/bed transfer assist level: Supervision or verbal cues Chair/bed transfer assistive device: Sliding board, Armrests Mechanical lift: Stedy   Locomotion Ambulation Ambulation activity did not occur: Safety/medical concerns   Max distance: 30' Assist level: Total assist (Pt < 25%)   Wheelchair   Type: Manual Max wheelchair distance: 150' Assist Level: No help, No cues, assistive device, takes more than reasonable amount of time  Cognition Comprehension Comprehension assist level: Follows complex conversation/direction with no assist  Expression Expression assist level: Expresses complex ideas: With no assist  Social Interaction Social Interaction assist level: Interacts appropriately with others - No medications needed.  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   Cont CIR 2. DVT Prophylaxis/Anticoagulation: Mechanical:Sequential compression devices, below kneeBilateral lower extremities 3. Pain Management:Celebrex d/ced   Decadron taper, last decreased on 7/21 4. Mood:team to provide ego support. LCSW to follow for evaluation and support.  -sl increase trazodone for sleep disturbance  -encouraged him to use later if needed. 5. Neuropsych: This patientiscapable of making decisions on hisown behalf. 6. Skin/Wound Care:monitor wound for healing. Added protein supplement to promote wound healing. 7. Fluids/Electrolytes/Nutrition:  -continue to push fluids.   -potassium 4.4 on 7/22  -celebrex d/ced 8. Urinary retention/BPH/neurogenic  bladder:H/o hypospadias.  -Retention has resolved 9. HTN: Monitor BP bid--  and lisinopril. HCTZ stopped Vitals:   07/02/18 1928 07/03/18 0603  BP: (!) 105/59 127/67  Pulse: 72 (!) 51  Resp: 17 20  Temp: 98.2 F (36.8 C) 97.8 F (36.6 C)  SpO2: 96% 99%   Controlled on 7/23 10. Steroid induced hypoglycemia on Prediabetes: CM diet. 11. GERD:  continue PPI due to h/o of gastritis and now on decadron. 12. Leukocytosis; Likely steroid induced,   WBCs 14.5K on 06/29/2018  Will order follow-up labs this week.  Continue to monitor 13. Neurogenic bowel: advanced regminen with results  LOS (Days) 11 A FACE TO FACE EVALUATION WAS PERFORMED  Ankit Lorie Phenix, MD 07/03/2018 9:09 AM

## 2018-07-03 NOTE — Progress Notes (Signed)
Physical Therapy Session Note  Patient Details  Name: Trevor [name] MRN: [id number] Date of Birth: [birth year]  Today's Date: 07/03/2018 PT Individual Time: 0830-0912 PT Individual Time Calculation (min): 42 min   Short Term Goals: Week 2:  PT Short Term Goal 1 (Week 2): pt will perform functional transfers with supervision PT Short Term Goal 2 (Week 2): pt will perform sit <> stand with max A with RW PT Short Term Goal 3 (Week 2): pt will be supervision with w/c parts management  Skilled Therapeutic Interventions/Progress Updates:    Pt performs w/c mobility throughout unit with mod I.  Squat pivot transfer training blocked practice to various height surfaces with steadying assist. Pt able to place feet and UEs and perform wt shifts without cuing.  Sit to stands from elevated mat with pt able to perform x 3 with mod A.  Pt with improved LE strength and able to push to stand.  Pt able to stand and perform wt shfits with min/mod A 3 x 1 minute.  kinetron in sitting x 4 minutes with rest breaks with pt with improved LE strength noted.  Therapy Documentation Precautions:  Precautions Precautions: Fall Precaution Comments: spinal precautions: no bending, lifting, twisting, arching Restrictions Weight Bearing Restrictions: No Other Position/Activity Restrictions: No brace needed per orders Pain:  no c/o pain   Therapy/Group: Individual Therapy  Dayshia Ballinas 07/03/2018, 9:13 AM

## 2018-07-04 ENCOUNTER — Inpatient Hospital Stay (HOSPITAL_COMMUNITY): Payer: Medicare Other | Admitting: Occupational Therapy

## 2018-07-04 ENCOUNTER — Inpatient Hospital Stay (HOSPITAL_COMMUNITY): Payer: Medicare Other | Admitting: Physical Therapy

## 2018-07-04 MED ORDER — DEXAMETHASONE 2 MG PO TABS
2.0000 mg | ORAL_TABLET | Freq: Every day | ORAL | Status: AC
  Administered 2018-07-07 – 2018-07-09 (×3): 2 mg via ORAL
  Filled 2018-07-04 (×3): qty 1

## 2018-07-04 MED ORDER — DEXAMETHASONE 2 MG PO TABS
2.0000 mg | ORAL_TABLET | Freq: Two times a day (BID) | ORAL | Status: AC
  Administered 2018-07-04 – 2018-07-06 (×5): 2 mg via ORAL
  Filled 2018-07-04 (×4): qty 1

## 2018-07-04 NOTE — Progress Notes (Signed)
Social Work Patient ID: Trevor [name], male   DOB: August 27, 1946, 72 y.o.   MRN: [id number]   CSW met with pt to update him on team conference discussion.  Pt's d/c date remains 07-13-18, but pt is still unsure where he will go at d/c.  Two family members have offered for pt to come to their home, but pt feels he will be a burden to them and he does not want to do that.  Pt will need a ramped entrance wherever he goes and CSW reiterated this several times.  Pt is still contemplating care after CIR and may even consider SNF care.  This CSW will discuss with pt's usual CSW, Lennart Pall, upon her return tomorrow so that she is aware of pt's concerns and thoughts regarding d/c disposition.  CSW dept remains available as needed.

## 2018-07-04 NOTE — Progress Notes (Signed)
Physical Therapy Session Note  Patient Details  Name: Trevor [name] MRN: [id number] Date of Birth: 13-Jul-1946  Today's Date: 07/04/2018 PT Individual Time: 6606-3016 PT Individual Time Calculation (min): 40 min   Short Term Goals: Week 2:  PT Short Term Goal 1 (Week 2): pt will perform functional transfers with supervision PT Short Term Goal 2 (Week 2): pt will perform sit <> stand with max A with RW PT Short Term Goal 3 (Week 2): pt will be supervision with w/c parts management  Skilled Therapeutic Interventions/Progress Updates:    Pt performs w/c mobility with mod I throughout unit.  Gait at parallel bars 3 x 10' with heavy reliance on UEs, min A.  2 episodes of knees buckling requiring max A to correct.  Knee buckling mostly during eccentric movements in preparation to sit.  Seated balance training with ball toss with 1 episode of LOB with reaching forward requiring min A to correct.  Seated LE therex 2 x 15 hip abd, add ball squeeze, knee flex/ext with pillowcase under foot.  Pt improve hip and knee strength.  Pt left in room with needs at hand.  Therapy Documentation Precautions:  Precautions Precautions: Fall Precaution Comments: spinal precautions: no bending, lifting, twisting, arching Restrictions Weight Bearing Restrictions: No Other Position/Activity Restrictions: No brace needed per orders Pain:  no c/o pain   Therapy/Group: Individual Therapy  Sabreena Vogan 07/04/2018, 10:27 AM

## 2018-07-04 NOTE — Discharge Instructions (Signed)
Inpatient Rehab Discharge Instructions  QUAMERE MUSSELL Discharge date and time:    Activities/Precautions/ Functional Status: Activity: no lifting, driving, or strenuous exercise till cleared by MD.  Diet: regular diet Wound Care: keep wound clean and dry    Functional status:   ___ No restrictions     ___ Walk up steps independently _X__ 24/7 supervision/assistance   ___ Walk up steps with assistance ___ Intermittent supervision/assistance  ___ Bathe/dress independently ___ Walk with walker     _X__ Bathe/dress with assistance ___ Walk Independently    ___ Shower independently ___ Walk with assistance    ___ Shower with assistance _X__ No alcohol     ___ Return to work/school ________  Special Instructions:    My questions have been answered and I understand these instructions. I will adhere to these goals and the provided educational materials after my discharge from the hospital.  Patient/Caregiver Signature _______________________________ Date __________  Clinician Signature _______________________________________ Date __________  Please bring this form and your medication list with you to all your follow-up doctor's appointments.      (Dr Izora Ribas can be reached at [phone].  Estill Bamberg,  [place] can be reached mon-fri at [phone])

## 2018-07-04 NOTE — Progress Notes (Signed)
Physical Therapy Session Note  Patient Details  Name: Trevor [name] MRN: [id number] Date of Birth: 01-04-1946  Today's Date: 07/04/2018 PT Individual Time: 1545-1630 PT Individual Time Calculation (min): 45 min   Short Term Goals: Week 2:  PT Short Term Goal 1 (Week 2): pt will perform functional transfers with supervision PT Short Term Goal 2 (Week 2): pt will perform sit <> stand with max A with RW PT Short Term Goal 3 (Week 2): pt will be supervision with w/c parts management  Skilled Therapeutic Interventions/Progress Updates:    Pt reported 2/10 back pain prior to treatment session onset which therapist monitored throughout treatment duration. Pt propelled w/c from room <> treatment gym for total of 150 feet. Pt performed slide board transfer from w/c> mat with proper chair alignment and min A for board placement. Pt performed transfer with touching assist and went from sitting edge of mat > supine with CGA. Pt performed LE supine strengthening including isometric glut and quad holds and heel slides for 3x/10 reps. Pt required assist for L LE to perform sets of heel slides indicating increased pain to 4/10 on 3rd set due to straining with observable facial grimacing. Pt required frequent rest breaks during pain onset but symptoms subsided with rest. Pt performed supine>sitting edge of mat with CGA and requested squat pivot back to chair. Pt performed squat pivot with mod A for boosting from mat> chair. Pt propelled w/c back to room with tray table and call bell in reach and all needs met.   Therapy Documentation Precautions:  Precautions Precautions: Fall Precaution Comments: spinal precautions: no bending, lifting, twisting, arching Restrictions Weight Bearing Restrictions: No Other Position/Activity Restrictions: No brace needed per orders   See Function Navigator for Current Functional Status.   Therapy/Group: Individual Therapy  Floreen Comber 07/04/2018, 4:53 PM

## 2018-07-04 NOTE — Progress Notes (Signed)
[facility]IC[facility] RE[facility]ABILITATION     PROGRESS NOTE    Subjective/Complaints: Patient seen ing up in his his morning. He states he night. He notes some mild back pain after therapies yesterday.  ROS: denies CP, SOB, nausea, vomiting, diarrhea.  Objective:  No results found. No results for input(s): WBC, HGB, HCT, PLT in the last 72 hours. Recent Labs    07/02/18 0636  NA 135  K 4.4  CL 97*  GLUCOSE 123*  BUN 29*  CREATININE 0.89  CALCIUM 9.0   CBG (last 3)  No results for input(s): GLUCAP in the last 72 hours.  Wt Readings from Last 3 Encounters:  06/22/18 82.7 kg (182 lb 4.8 oz)  06/22/18 81.6 kg (180 lb)  06/18/18 81.6 kg (180 lb)     Intake/Output Summary (Last 24 hours) at 07/04/2018 1117 Last data filed at 07/04/2018 0900 Gross per 24 hour  Intake 360 ml  Output -  Net 360 ml    Vital Signs: Blood pressure (!) 145/66, pulse (!) 55, temperature (!) 97.5 F (36.4 C), temperature source Oral, resp. rate 17, height 5\' 8"  (1.727 m), weight 82.7 kg (182 lb 4.8 oz), SpO2 100 %. Physical Exam:  Constitutional: No distress . Vital signs reviewed. HENT: Normocephalic.  Atraumatic. Eyes: EOMI. No discharge. Cardiovascular: RRR. No JVD. Respiratory: CTA bilaterally. Normal effort. GI: BS +. Non-distended. Musc: mild tenderness around surgical site. Neurological:alert Speech clear.  Able to follow basic commands without difficulty. Motor: B/l 5/5 (unchanged).  B/l LE: Hip flexion 2/5, knee extension 3+/5, ankle dorsiflexion 4 -/5 (right stronger than left, unchanged) Sensation intact light touch Skin:No rashnoted. Surgical incision healing Psych: pleasant and appropriate  Assessment/Plan: 1. 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 Physiatrist is providing close team supervision and 24 hour management of active medical  problems listed below. Physiatrist and rehab team continue to assess barriers to discharge/monitor patient progress toward functional and medical goals.  Function:  Bathing Bathing position   Position: Shower  Bathing parts Body parts bathed by patient: Left arm, Right arm, Abdomen, Chest, Front perineal area, Buttocks, Right upper leg, Left upper leg, Right lower leg, Left lower leg, Back Body parts bathed by helper: Back, Buttocks  Bathing assist Assist Level: Supervision or verbal cues Assistive Device Comment: LH sponge    Upper Body Dressing/Undressing Upper body dressing   What is the patient wearing?: Pull over shirt/dress     Pull over shirt/dress - Perfomed by patient: Thread/unthread right sleeve, Thread/unthread left sleeve, Put head through opening, Pull shirt over trunk          Upper body assist Assist Level: Set up   Set up : To obtain clothing/put away  Lower Body Dressing/Undressing Lower body dressing   What is the patient wearing?: Pants, Non-skid slipper socks     Pants- Performed by patient: Thread/unthread right pants leg, Thread/unthread left pants leg, Pull pants up/down Pants- Performed by helper: Pull pants up/down Non-skid slipper socks- Performed by patient: Don/doff right sock, Don/doff left sock Non-skid slipper socks- Performed by helper: Don/doff right sock, Don/doff left sock     Shoes - Performed by patient: Don/doff right shoe, Don/doff left shoe, Fasten right, Fasten left       TED Hose - Performed by patient: Don/doff right TED hose, Don/doff left TED hose TED Hose - Performed by helper: Don/doff left TED hose, Don/doff right TED  hose  Lower body assist Assist for lower body dressing: Touching or steadying assistance (Pt > 75%)      Toileting Toileting Toileting activity did not occur: No continent bowel/bladder event Toileting steps completed by patient: Adjust clothing prior to toileting, Performs perineal hygiene, Adjust clothing  after toileting Toileting steps completed by helper: Performs perineal hygiene Toileting Assistive Devices: Grab bar or rail  Toileting assist Assist level: Supervision or verbal cues   Transfers Chair/bed transfer   Chair/bed transfer method: Squat pivot Chair/bed transfer assist level: Supervision or verbal cues Chair/bed transfer assistive device: Armrests Mechanical lift: Stedy   Locomotion Ambulation Ambulation activity did not occur: Safety/medical concerns   Max distance: 30 Assist level: 2 helpers(modA, w/c follow)   Wheelchair   Type: Manual Max wheelchair distance: 150' Assist Level: No help, No cues, assistive device, takes more than reasonable amount of time  Cognition Comprehension Comprehension assist level: Follows complex conversation/direction with no assist  Expression Expression assist level: Expresses complex ideas: With no assist  Social Interaction Social Interaction assist level: Interacts appropriately with others - No medications needed.  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   Cont CIR 2. DVT Prophylaxis/Anticoagulation: Mechanical:Sequential compression devices, below kneeBilateral lower extremities 3. Pain Management:Celebrex d/ced   Decadron taper, last decreased on 7/24  Kpad for back 4. Mood:team to provide ego support. LCSW to follow for evaluation and support.  -sl increase trazodone for sleep disturbance  -encouraged him to use later if needed. 5. Neuropsych: This patientiscapable of making decisions on hisown behalf. 6. Skin/Wound Care:monitor wound for healing. Added protein supplement to promote wound healing. 7. Fluids/Electrolytes/Nutrition:  -continue to push fluids.   -potassium 4.4 on 7/22  -celebrex d/ced 8.  Urinary retention/BPH/neurogenic bladder:H/o hypospadias.  -Retention has resolved 9. HTN: Monitor BP bid--  and lisinopril. HCTZ stopped Vitals:   07/03/18 2000 07/04/18 0407  BP: 138/69 (!) 145/66  Pulse: 63 (!) 55  Resp: 18 17  Temp: 97.7 F (36.5 C) (!) 97.5 F (36.4 C)  SpO2: 100% 100%   Relatively controlled on 7/24 10. Steroid induced hypoglycemia on Prediabetes: CM diet. 11. GERD:  continue PPI due to h/o of gastritis and now on decadron. 12. Leukocytosis; Likely steroid induced,   WBCs 14.5K on 06/29/2018  Labs ordered for tomorrow  Continue to monitor 13. Neurogenic bowel: advanced regminen with results  LOS (Days) 12 A FACE TO FACE EVALUATION WAS PERFORMED  Ankit Lorie Phenix, MD 07/04/2018 11:17 AM

## 2018-07-04 NOTE — Progress Notes (Signed)
Occupational Therapy Session Note  Patient Details  Name: Trevor [name] MRN: [id number] Date of Birth: Jun 25, 1946  Today's Date: 07/04/2018  Session 1 OT Individual Time: 1115-1200 OT Individual Time Calculation (min): 45 min   Session 2 OT Individual Time: 1300-1400 OT Individual Time Calculation (min): 60 min    Short Term Goals: Week 2:  OT Short Term Goal 1 (Week 2): Pt will complete toilet/BSC transfer with no more than Mod A OT Short Term Goal 2 (Week 2): Pt will utilize leaning method in shower to wash buttocks OT Short Term Goal 3 (Week 2): Pt will complete 2 grooming tasks in supported standing  OT Short Term Goal 4 (Week 2): Pt will follow back precautions within BADL tasks with no more than min questioning cues  Skilled Therapeutic Interventions/Progress Updates:    Session 1 Pt greeted seated in wc and agreeable to OT treatment session. Reviewed body mechanics for squat-pivot transfer in and out of shower using tub bench-pt verbalized understanding. Pt needed assistance to position wc 2/2 space constraints. Squat-pivot transfer completed onto tub bench with mod A. Pt able to utilize leaning method to remove pants from hips. Bathing completed at overall set-up/supervision level with leaning method to wash buttocks. Squat-pivot out of shower with mod A to the L. Dressing completed wc level with ADL training using Scribner reacher. Pt demonstrated ability to thread B LEs into pants using reacher with min verbal cues. Pt unable to advance pants over hips using leaning method in wc. Pt did wc push up to lift LEs and OT assisted with pulling pants over hips. Discussed option of LB dressing completed in bed with rolling to clear bottom to pull up pants. Grooming tasks completed wc level at the sink without assistance. Pt left seated in wc with chair alarm on and lunch set-up.   Session 2 Pt greeted seated in wc and agreeable to OT treatment session. Pt propelled wc to therapy gym and worked on  Paramedic and home Medical illustrator. Pt able to negotiate computer with min verbal cues to learn unfamiliar computer functions. Able to successfully pay bills without assist. BUE strength/coordination with wc propulsion to [employer]. Educated pt on Land and community access from wc level Publishing copy access. Practiced propelling wc on uneven surfaces outside. 2 sets of 10 LB there ex- hip adduction, hip abduction, hip flexion, knee flex/ext, and wc push-ups. Pt returned to room and completed lateral scoot back to bed with min A. Sit>supine min A. Pt left with bed alarm on and needs met.   Therapy Documentation Precautions:  Precautions Precautions: Fall Precaution Comments: spinal precautions: no bending, lifting, twisting, arching Restrictions Weight Bearing Restrictions: No Other Position/Activity Restrictions: No brace needed per orders Pain:  none/denies pain ADL: ADL ADL Comments: Please see functional navigator for ADL status  See Function Navigator for Current Functional Status.   Therapy/Group: Individual Therapy  Valma Cava 07/04/2018, 2:02 PM

## 2018-07-05 ENCOUNTER — Inpatient Hospital Stay (HOSPITAL_COMMUNITY): Payer: Medicare Other | Admitting: Occupational Therapy

## 2018-07-05 ENCOUNTER — Inpatient Hospital Stay (HOSPITAL_COMMUNITY): Payer: Medicare Other | Admitting: Physical Therapy

## 2018-07-05 ENCOUNTER — Inpatient Hospital Stay (HOSPITAL_COMMUNITY): Payer: Medicare Other

## 2018-07-05 DIAGNOSIS — D62 Acute posthemorrhagic anemia: Secondary | ICD-10-CM

## 2018-07-05 DIAGNOSIS — D696 Thrombocytopenia, unspecified: Secondary | ICD-10-CM

## 2018-07-05 LAB — CBC WITH DIFFERENTIAL/PLATELET
Abs Immature Granulocytes: 0.1 10*3/uL (ref 0.0–0.1)
BASOS PCT: 0 %
Basophils Absolute: 0 10*3/uL (ref 0.0–0.1)
EOS ABS: 0 10*3/uL (ref 0.0–0.7)
Eosinophils Relative: 0 %
HCT: 38.2 % — ABNORMAL LOW (ref 39.0–52.0)
Hemoglobin: 12.4 g/dL — ABNORMAL LOW (ref 13.0–17.0)
IMMATURE GRANULOCYTES: 1 %
Lymphocytes Relative: 7 %
Lymphs Abs: 0.8 10*3/uL (ref 0.7–4.0)
MCH: 29.2 pg (ref 26.0–34.0)
MCHC: 32.5 g/dL (ref 30.0–36.0)
MCV: 89.9 fL (ref 78.0–100.0)
Monocytes Absolute: 1.1 10*3/uL — ABNORMAL HIGH (ref 0.1–1.0)
Monocytes Relative: 9 %
NEUTROS PCT: 83 %
Neutro Abs: 10.3 10*3/uL — ABNORMAL HIGH (ref 1.7–7.7)
PLATELETS: 126 10*3/uL — AB (ref 150–400)
RBC: 4.25 MIL/uL (ref 4.22–5.81)
RDW: 12.2 % (ref 11.5–15.5)
WBC: 12.4 10*3/uL — AB (ref 4.0–10.5)

## 2018-07-05 NOTE — Plan of Care (Signed)
  Problem: Consults Goal: RH SPINAL CORD INJURY PATIENT EDUCATION Description  See Patient Education module for education specifics.  Outcome: Progressing   Problem: SCI BOWEL ELIMINATION Goal: RH STG MANAGE BOWEL WITH ASSISTANCE Description STG Manage Bowel with mod Assistance.  Outcome: Progressing   Problem: SCI BLADDER ELIMINATION Goal: RH STG MANAGE BLADDER WITH ASSISTANCE Description STG Manage Bladder With mod Assistance  Outcome: Progressing   Problem: RH SKIN INTEGRITY Goal: RH STG MAINTAIN SKIN INTEGRITY WITH ASSISTANCE Description STG Maintain Skin Integrity With mod I Assistance.  Outcome: Progressing   Problem: RH SAFETY Goal: RH STG ADHERE TO SAFETY PRECAUTIONS W/ASSISTANCE/DEVICE Description STG Adhere to Safety Precautions With min Assistance and appropriate assistive Device.  Outcome: Progressing Goal: RH STG DECREASED RISK OF FALL WITH ASSISTANCE Description STG Decreased Risk of Fall With toileting min Assistance.   Outcome: Progressing   Problem: RH PAIN MANAGEMENT Goal: RH STG PAIN MANAGED AT OR BELOW PT'S PAIN GOAL Description <3 on a 0-10 pain scale  Outcome: Progressing

## 2018-07-05 NOTE — Progress Notes (Signed)
Physical Therapy Weekly Progress Note  Patient Details  Name: Trevor [name] MRN: [id number] Date of Birth: 07-25-1946  Beginning of progress report period: June 28, 2018 End of progress report period: July 05, 2018  Today's Date: 07/05/2018 PT Individual Time: 0730-0825 PT Individual Time Calculation (min): 55 min   Pt performs w/c mobility and parts management with mod I throughout session.  Standing in parallel bars with min A for sit to stand with use of bars, gait training fwd/bkwd stepping 3 x 10' with min A, pt able to prevent buckling by heavy reliance on UEs.  Sit to stand training in stedy with focus on quad and glute strengthening with pt with improvements in LE strength but continues to rely heavily on UEs.  Nustep for continued LE strengthening level 4 x 8 minutes.  Seated partial crunches for abdominal strengthening with pt reclined and sitting up to 90 degrees (due to back precautions) with pt with notable ab weakness, requires frequent rests. Pt left in room with needs at hand.  Patient has met 2 of 3 short term goals.  Pt progressing towards supervision with transfers. Pt improving LE strength to max A with sit to stand from elevated surface, pt mod I with w/c management.  Patient continues to demonstrate the following deficits muscle weakness, unbalanced muscle activation and decreased sitting balance, decreased standing balance, decreased postural control, decreased balance strategies and difficulty maintaining precautions and therefore will continue to benefit from skilled PT intervention to increase functional independence with mobility.  Patient progressing toward long term goals..  Continue plan of care.  PT Short Term Goals Week 2:  PT Short Term Goal 1 (Week 2): pt will perform functional transfers with supervision PT Short Term Goal 1 - Progress (Week 2): Progressing toward goal PT Short Term Goal 2 (Week 2): pt will perform sit <> stand with max A with RW PT Short Term  Goal 2 - Progress (Week 2): Met PT Short Term Goal 3 (Week 2): pt will be supervision with w/c parts management PT Short Term Goal 3 - Progress (Week 2): Met Week 3:  PT Short Term Goal 1 (Week 3): = LTG  Skilled Therapeutic Interventions/Progress Updates:  Ambulation/gait training;Balance/vestibular training;Community reintegration;Discharge planning;Disease management/prevention;DME/adaptive equipment instruction;Functional electrical stimulation;Functional mobility training;Neuromuscular re-education;Pain management;Patient/family education;Psychosocial support;Skin care/wound management;Splinting/orthotics;Therapeutic Activities;Stair training;UE/LE Coordination activities;Visual/perceptual remediation/compensation;Wheelchair propulsion/positioning;UE/LE Strength taining/ROM;Therapeutic Exercise   Therapy Documentation Precautions:  Precautions Precautions: Fall Precaution Comments: spinal precautions: no bending, lifting, twisting, arching Restrictions Weight Bearing Restrictions: No Other Position/Activity Restrictions: No brace needed per orders Pain:  pt c/o pain in low back with activity, eases with rest, pt refuses pain meds at this time  Therapy/Group: Individual Therapy  DONAWERTH,KAREN 07/05/2018, 8:23 AM

## 2018-07-05 NOTE — Progress Notes (Signed)
[facility] PHYSICAL MEDICINE & REHABILITATION     PROGRESS NOTE    Subjective/Complaints: Patient seen sitting up in his chair this morning. He states he slept well overnight. He notes he had back pain after therapies yesterday, but this was improved with medications. He denies back pain this morning.  ROS: denies CP, SOB, nausea, vomiting, diarrhea.  Objective:  No results found. Recent Labs    07/05/18 0501  WBC 12.4*  HGB 12.4*  HCT 38.2*  PLT 126*   No results for input(s): NA, K, CL, GLUCOSE, BUN, CREATININE, CALCIUM in the last 72 hours.  Invalid input(s): CO CBG (last 3)  No results for input(s): GLUCAP in the last 72 hours.  Wt Readings from Last 3 Encounters:  06/22/18 82.7 kg (182 lb 4.8 oz)  06/22/18 81.6 kg (180 lb)  06/18/18 81.6 kg (180 lb)     Intake/Output Summary (Last 24 hours) at 07/05/2018 0947 Last data filed at 07/04/2018 1700 Gross per 24 hour  Intake 480 ml  Output -  Net 480 ml    Vital Signs: Blood pressure 135/61, pulse (!) 55, temperature 97.8 F (36.6 C), temperature source Oral, resp. rate 18, height 5\' 8"  (1.727 m), weight 82.7 kg (182 lb 4.8 oz), SpO2 98 %. Physical Exam:  Constitutional: No distress . Vital signs reviewed. HENT: Normocephalic.  Atraumatic. Eyes: EOMI. No discharge. Cardiovascular: RRR. No JVD. Respiratory: CTA bilaterally. Normal effort. GI: BS +. Non-distended. Musc: mild tenderness around surgical site. Neurological:alert Speech clear.  Able to follow basic commands without difficulty. Motor: B/l 5/5 (unchanged).  B/l LE: Hip flexion 2/5, knee extension 3+/5, ankle dorsiflexion 4 -/5 (right stronger than left, stable) Sensation intact light touch Skin:No rashnoted. Surgical incision healing, with mild surrounding edema, no erythema, no warmth Psych: Pleasant and appropriate  Assessment/Plan: 1. 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 Physiatrist is providing close team supervision and 24 hour management of active medical problems listed below. Physiatrist and rehab team continue to assess barriers to discharge/monitor patient progress toward functional and medical goals.  Function:  Bathing Bathing position   Position: Shower  Bathing parts Body parts bathed by patient: Left arm, Right arm, Abdomen, Chest, Front perineal area, Buttocks, Right upper leg, Left upper leg, Right lower leg, Left lower leg, Back Body parts bathed by helper: Back, Buttocks  Bathing assist Assist Level: Supervision or verbal cues Assistive Device Comment: LH sponge    Upper Body Dressing/Undressing Upper body dressing   What is the patient wearing?: Pull over shirt/dress     Pull over shirt/dress - Perfomed by patient: Thread/unthread right sleeve, Thread/unthread left sleeve, Put head through opening, Pull shirt over trunk          Upper body assist Assist Level: More than reasonable time   Set up : To obtain clothing/put away  Lower Body Dressing/Undressing Lower body dressing   What is the patient wearing?: Pants, Non-skid slipper socks     Pants- Performed by patient: Thread/unthread right pants leg, Thread/unthread left pants leg, Pull pants up/down Pants- Performed by helper: Pull pants up/down Non-skid slipper socks- Performed by patient: Don/doff left sock, Don/doff right sock Non-skid slipper socks- Performed by helper: Don/doff right sock, Don/doff left sock     Shoes - Performed by patient: Don/doff right shoe, Don/doff left shoe, Fasten right, Fasten left       TED Hose - Performed by patient:  Don/doff right TED hose, Don/doff left TED hose TED Hose - Performed by helper: Don/doff left TED hose, Don/doff right TED hose  Lower body assist Assist for lower body dressing: Touching or steadying assistance (Pt > 75%)      Toileting Toileting  Toileting activity did not occur: No continent bowel/bladder event Toileting steps completed by patient: Adjust clothing prior to toileting, Performs perineal hygiene, Adjust clothing after toileting Toileting steps completed by helper: Performs perineal hygiene Toileting Assistive Devices: Grab bar or rail  Toileting assist Assist level: Supervision or verbal cues   Transfers Chair/bed transfer   Chair/bed transfer method: Lateral scoot Chair/bed transfer assist level: Touching or steadying assistance (Pt > 75%) Chair/bed transfer assistive device: Armrests(slide board) Mechanical lift: Stedy   Locomotion Ambulation Ambulation activity did not occur: Safety/medical concerns   Max distance: 30 Assist level: 2 helpers(modA, w/c follow)   Wheelchair   Type: Manual Max wheelchair distance: 150 Assist Level: No help, No cues, assistive device, takes more than reasonable amount of time  Cognition Comprehension Comprehension assist level: Follows complex conversation/direction with no assist  Expression Expression assist level: Expresses complex ideas: With no assist  Social Interaction Social Interaction assist level: Interacts appropriately with others - No medications needed.  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   Cont CIR 2. DVT Prophylaxis/Anticoagulation: Mechanical:Sequential compression devices, below kneeBilateral lower extremities 3. Pain Management:Celebrex d/ced   Decadron taper, last decreased on 7/24  Kpad for back ordered 4. Mood:team to provide ego support. LCSW to follow for evaluation and support.  -sl increase trazodone for sleep disturbance  -encouraged him to use later if needed. 5. Neuropsych: This patientiscapable of making decisions on hisown  behalf. 6. Skin/Wound Care:monitor wound for healing. Added protein supplement to promote wound healing. 7. Fluids/Electrolytes/Nutrition:  -continue to push fluids.   -potassium 4.4 on 7/22  -celebrex d/ced 8. Urinary retention/BPH/neurogenic bladder:H/o hypospadias.  -Retention has resolved 9. HTN: Monitor BP bid--  and lisinopril. HCTZ stopped Vitals:   07/04/18 2039 07/05/18 0437  BP: 136/79 135/61  Pulse: 60 (!) 55  Resp: 17 18  Temp: 97.9 F (36.6 C) 97.8 F (36.6 C)  SpO2: 99% 98%   Relatively controlled on 7/25 10. Steroid induced hypoglycemia on Prediabetes: CM diet. 11. GERD:  continue PPI due to h/o of gastritis and now on decadron. 12. Leukocytosis; Likely steroid induced,   WBCs 12.4 on 7/25  Continue to monitor 13. Neurogenic bowel: advanced regminen with results 14. Acute blood loss anemia  Hemoglobin 12.17/25  Cont to monitor 15. Thrombocytopenia  Platelets 126 on 7/25  Continue to monitor  LOS (Days) 13 A FACE TO FACE EVALUATION WAS PERFORMED  Freddie Dymek Lorie Phenix, MD 07/05/2018 9:47 AM

## 2018-07-05 NOTE — NC FL2 (Signed)
[place] LEVEL OF CARE SCREENING TOOL     IDENTIFICATION  Patient Name: Trevor [name] Birthdate: October 29, 1946 Sex: male Admission Date (Current Location): 06/22/2018  [facility] and [place] Number:  Herbalist and Address:  [facility], [place] [address] [place], [place] [address]      Provider Number: [id number]  Attending Physician Name and Address:  Meredith Staggers, MD  Relative Name and Phone Number:       Current Level of Care: Other (Comment)(Acute Inpatient Rehabilitation) Recommended Level of Care: [place] Prior Approval Number:    Date Approved/Denied:   PASRR Number: [id number]  Discharge Plan: SNF    Current Diagnoses: Patient Active Problem List   Diagnosis Date Noted  . Thrombocytopenia ([place])   . Acute blood loss anemia   . Sleep disturbance   . Essential hypertension   . Steroid-induced hyperglycemia   . Prediabetes   . Leukocytosis   . Herniation of lumbar intervertebral disc with radiculopathy 06/22/2018  . Lumbar radiculopathy 06/20/2018  . Nocturia 10/23/2015  . Absolute anemia 05/13/2014  . Benign essential HTN 05/13/2014  . Blood glucose elevated 05/13/2014  . LBP (low back pain) 05/13/2014  . HLD (hyperlipidemia) 05/13/2014  . 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 Benign prostatic hypertrophy without urinary obstruction 12/10/2012  . Hypospadias 12/10/2012  . Microscopic hematuria 12/10/2012  . History of neoplasm of bladder 12/10/2012  . FOM (frequency of micturition) 12/10/2012    Orientation RESPIRATION BLADDER Height & Weight     Self, Time, Situation, Place  Normal Continent Weight: 82.7 kg (182 lb 4.8 oz) Height:  5\' 8"  (172.7 cm)  BEHAVIORAL SYMPTOMS/MOOD NEUROLOGICAL BOWEL NUTRITION STATUS      Continent Diet(carb modified)  AMBULATORY STATUS COMMUNICATION OF NEEDS Skin   Total Care(wheelchair level mobility) Verbally Normal                        Personal Care Assistance Level of Assistance  Bathing, Feeding Bathing Assistance: Limited assistance Feeding assistance: Independent Dressing Assistance: Limited assistance     Functional Limitations Info             SPECIAL CARE FACTORS FREQUENCY  PT (By licensed PT), OT (By licensed OT)     PT Frequency: 5x/wk OT Frequency: 5x/wk            Contractures Contractures Info: Not present    Additional Factors Info  Code Status Code Status Info: Full             Current Medications (07/05/2018):  This is the current hospital active medication list Current Facility-Administered Medications  Medication Dose Route Frequency Provider Last Rate Last Dose  . acetaminophen (TYLENOL) tablet 325-650 mg  325-650 mg Oral Q4H PRN Bary Leriche, PA-C   650 mg at 07/05/18 0846  . alum & mag hydroxide-simeth (MAALOX/MYLANTA) 200-200-20 MG/5ML suspension 30 mL  30 mL Oral Q4H PRN Love, Pamela S, PA-C      . bisacodyl (DULCOLAX) suppository 10 mg  10 mg Rectal Daily PRN Bary Leriche, PA-C   10 mg at 06/25/18 7867  . dexamethasone (DECADRON) tablet 2 mg  2 mg Oral Q12H Love, Pamela S, PA-C   2 mg at 07/05/18 6720   Followed by  . [START ON 07/07/2018] dexamethasone (DECADRON) tablet 2 mg  2 mg Oral Daily Love, Pamela S, PA-C      .  diphenhydrAMINE (BENADRYL) 12.5 MG/5ML elixir 12.5-25 mg  12.5-25 mg Oral Q6H PRN Love, Pamela S, PA-C      . guaiFENesin-dextromethorphan (ROBITUSSIN DM) 100-10 MG/5ML syrup 5-10 mL  5-10 mL Oral Q6H PRN Love, Pamela S, PA-C      . lisinopril (PRINIVIL,ZESTRIL) tablet 10 mg  10 mg Oral Daily Bary Leriche, PA-C   10 mg at 07/05/18 5784  . menthol-cetylpyridinium (CEPACOL) lozenge 3 mg  1 lozenge Oral PRN Love, Pamela S, PA-C       Or  . phenol (CHLORASEPTIC) mouth spray 1 spray  1 spray Mouth/Throat PRN Love, Pamela S, PA-C      . methocarbamol (ROBAXIN) tablet 500 mg  500 mg Oral Q6H PRN Love, Pamela S, PA-C       Or  .  methocarbamol (ROBAXIN) 500 mg in dextrose 5 % 50 mL IVPB  500 mg Intravenous Q6H PRN Love, Pamela S, PA-C      . multivitamin with minerals tablet 1 tablet  1 tablet Oral Daily Bary Leriche, PA-C   1 tablet at 07/05/18 [id number]  . ondansetron (ZOFRAN) tablet 4 mg  4 mg Oral Q6H PRN Love, Pamela S, PA-C       Or  . ondansetron (ZOFRAN) injection 4 mg  4 mg Intravenous Q6H PRN Love, Pamela S, PA-C      . oxyCODONE (Oxy IR/ROXICODONE) immediate release tablet 10 mg  10 mg Oral Q3H PRN Bary Leriche, PA-C   10 mg at 07/04/18 2257  . oxyCODONE (Oxy IR/ROXICODONE) immediate release tablet 5 mg  5 mg Oral Q3H PRN Bary Leriche, PA-C   5 mg at 07/02/18 2209  . pantoprazole (PROTONIX) EC tablet 40 mg  40 mg Oral Daily Bary Leriche, PA-C   40 mg at 07/05/18 9528  . polyethylene glycol (MIRALAX / GLYCOLAX) packet 17 g  17 g Oral Daily PRN Love, Pamela S, PA-C      . prochlorperazine (COMPAZINE) tablet 5-10 mg  5-10 mg Oral Q6H PRN Love, Pamela S, PA-C       Or  . prochlorperazine (COMPAZINE) injection 5-10 mg  5-10 mg Intramuscular Q6H PRN Love, Pamela S, PA-C       Or  . prochlorperazine (COMPAZINE) suppository 12.5 mg  12.5 mg Rectal Q6H PRN Love, Pamela S, PA-C      . senna-docusate (Senokot-S) tablet 1 tablet  1 tablet Oral BID PRN Bary Leriche, PA-C   1 tablet at 07/05/18 0846  . simvastatin (ZOCOR) tablet 40 mg  40 mg Oral Daily Bary Leriche, PA-C   40 mg at 07/05/18 4132  . sodium phosphate (FLEET) 7-19 GM/118ML enema 1 enema  1 enema Rectal Once PRN Love, Pamela S, PA-C      . sucralfate (CARAFATE) tablet 1.5 g  1.5 g Oral Daily PRN Love, Pamela S, PA-C   1.5 g at 07/05/18 1222  . tamsulosin (FLOMAX) capsule 0.4 mg  0.4 mg Oral Daily Bary Leriche, PA-C   0.4 mg at 07/05/18 0842  . traMADol (ULTRAM) tablet 50 mg  50 mg Oral Q6H PRN Bary Leriche, PA-C   50 mg at 07/04/18 2257  . traZODone (DESYREL) tablet 100 mg  100 mg Oral QHS Meredith Staggers, MD   100 mg at 07/04/18 2257  . traZODone  (DESYREL) tablet 50 mg  50 mg Oral QHS PRN Meredith Staggers, MD   50 mg at 07/03/18 0257     Discharge Medications:  Please see discharge summary for a list of discharge medications.  Relevant Imaging Results:  Relevant Lab Results:   Additional Information ss: [id number]  Jazari Ober, LCSW

## 2018-07-05 NOTE — Progress Notes (Addendum)
Physical Therapy Session Note  Patient Details  Name: Trevor [name] MRN: [id number] Date of Birth: 1946/11/14  Today's Date: 07/05/2018 PT Individual Time: 1400-1453 PT Individual Time Calculation (min): 53 min   Short Term Goals:  Week 2:  PT Short Term Goal 1 (Week 2): pt will perform functional transfers with supervision PT Short Term Goal 1 - Progress (Week 2): Progressing toward goal PT Short Term Goal 2 (Week 2): pt will perform sit <> stand with max A with RW PT Short Term Goal 2 - Progress (Week 2): Met PT Short Term Goal 3 (Week 2): pt will be supervision with w/c parts management PT Short Term Goal 3 - Progress (Week 2): Met  Skilled Therapeutic Interventions/Progress Updates:   Slide board transfer w/c> mat with cues for set-up, and min guard assist as pt moved, due to board sliding under him.    R/L side lying for L/R clam shell abductions ( in approx 75 degrees bil hip flexion), x 10 x 1, x 5 x 1.  Use of powder board for gravity eliminated 12 x 2 each : L hip flexion/extension and L knee flexion/extension with gluteal isometric holds at end range of extension.  With 7# on ankle and 8# on distal thigh, 10 x 1 each L knee flexion/extension and L hip flexion/extension.  In supine, R active assistive unilateral bridging, L hip adduction ( limited range), 2 x 10 each.  L side lying> sit with mod cues for sequencing, on firm mat.   Sitting EOB, pt attempted to elevate hips, but pt over-relies on UEs, and substitutes gastrocs for quads with inability to elevate hips.  Sit> stand from raised seat of Stedy (Airex mat on seat of Stedy to make it higher). Using bil UEs to pull up, fading to 1 UE to pull up, fading to 0UE to pull up, with mod assist for erect standing.  Pt transferred back to w/c via Stedy, and pushed himself back to room independently.      Therapy Documentation Precautions:  Precautions Precautions: Fall Precaution Comments: spinal precautions: no bending,  lifting, twisting, arching Restrictions Weight Bearing Restrictions: No Other Position/Activity Restrictions: No brace needed per orders   Pain: Denies pain      See Function Navigator for Current Functional Status.   Therapy/Group: Individual Therapy  Tippi Mccrae 07/05/2018, 4:38 PM

## 2018-07-05 NOTE — Progress Notes (Signed)
Occupational Therapy Session Note  Patient Details  Name: Trevor [name] MRN: [id number] Date of Birth: [birth year]  Today's Date: 07/05/2018 OT Individual Time: 1330-1400 OT Individual Time Calculation (min): 30 min     Skilled Therapeutic Interventions/Progress Updates:    1:1 Focus standing tolerance in the standing frame. Able to tolerate it for ~20 min while engaged in game of [place] using only one UE for support. No c/o discomfort.  Therapy Documentation Precautions:  Precautions Precautions: Fall Precaution Comments: spinal precautions: no bending, lifting, twisting, arching Restrictions Weight Bearing Restrictions: No Other Position/Activity Restrictions: No brace needed per orders    Vital Signs: Therapy Vitals Temp: 98.2 F (36.8 C) Temp Source: Oral Pulse Rate: 73 Resp: 18 BP: (!) 94/57 Patient Position (if appropriate): Sitting Oxygen Therapy SpO2: 98 % O2 Device: Room Air Pain:  no c/o pain in session- discussed with pt if back pain returns  ADL: ADL ADL Comments: Please see functional navigator for ADL status  See Function Navigator for Current Functional Status.   Therapy/Group: Individual Therapy  Willeen Cass [facility] 07/05/2018, 3:37 PM

## 2018-07-05 NOTE — Progress Notes (Signed)
Occupational Therapy Session Note  Patient Details  Name: Trevor [name] MRN: [id number] Date of Birth: [birth year]  Today's Date: 07/05/2018 OT Individual Time: 0904-1000 OT Individual Time Calculation (min): 56 min   Short Term Goals: Week 2:  OT Short Term Goal 1 (Week 2): Pt will complete toilet/BSC transfer with no more than Mod A OT Short Term Goal 2 (Week 2): Pt will utilize leaning method in shower to wash buttocks OT Short Term Goal 3 (Week 2): Pt will complete 2 grooming tasks in supported standing  OT Short Term Goal 4 (Week 2): Pt will follow back precautions within BADL tasks with no more than min questioning cues  Skilled Therapeutic Interventions/Progress Updates:    Pt greeted sitting in wc and agreeable to OT treatment session. Discussed POC and dc plan with pt. As of now, he has no one to assist him at home and he does not want to put a burden on his brother and sister-in-law. Pt is now thinking he may need SNF at dc or continued therapy prior to going home. Pt collected clothing at wc level, then propelled wc into bathroom. Min A to position wc at tub bench. Pt able to negotiate arm, rest, then completed lateral scoot onto tub bench with poor clearance of buttocks. Bathing completed at supervision level with set-up A to obtain supplies. Placed towel on slideboard, and positioned for pt, then he was able to transfer out of shower to wc with CGA and improved clearance. Discussed ways to increase independence at home for LB ADLs. Dressing completed wc at the sink. Min A with L knee block to stand, then CGA for balance while maintaining knee block for pt to pull up pants. Socks and shoes donned with increased time and set-up A in figure 4 position. Pt then propelled wc to therapy gym and worked on standing endurance while participating in [employer] Pt tolerated standing for almost 5 minutes while using B UEs to sort cards. Intermittent rest breaks to lean onto table. Pt returned  to room and left seated in wc with needs met.   Therapy Documentation Precautions:  Precautions Precautions: Fall Precaution Comments: spinal precautions: no bending, lifting, twisting, arching Restrictions Weight Bearing Restrictions: No Other Position/Activity Restrictions: No brace needed per orders Pain: Pain Assessment Pain Scale: 0-10 Pain Score: 4  Pain Type: Acute pain Pain Location: Back Pain Orientation: Lower;Mid;Left Pain Descriptors / Indicators: Aching Pain Frequency: Intermittent Pain Onset: On-going Patients Stated Pain Goal: 0 Pain Intervention(s): Medication (See eMAR) ADL: ADL ADL Comments: Please see functional navigator for ADL status  See Function Navigator for Current Functional Status.   Therapy/Group: Individual Therapy  Valma Cava 07/05/2018, 10:03 AM

## 2018-07-05 NOTE — Progress Notes (Signed)
Social Work Patient ID: Trevor [name], male   DOB: 02/19/1946, 72 y.o.   MRN: [id number]  Have discussed d/c plans with patient and brother + sis-in-law.  All are in agreement that they would like me to pursue SNF placement and will begin bed search now.  Have stressed to ALL of them that this is a short-term plan and that we would recommend that they continue to work on ramp access and determine assist they can provide longer term.    Leon Goodnow, LCSW

## 2018-07-06 ENCOUNTER — Inpatient Hospital Stay (HOSPITAL_COMMUNITY): Payer: Medicare Other | Admitting: Physical Therapy

## 2018-07-06 ENCOUNTER — Encounter
Admission: RE | Admit: 2018-07-06 | Discharge: 2018-07-06 | Disposition: A | Discharge status: Home / Self Care | Payer: Medicare Other | Source: Ambulatory Visit | Attending: Internal Medicine | Admitting: Internal Medicine

## 2018-07-06 ENCOUNTER — Inpatient Hospital Stay (HOSPITAL_COMMUNITY): Payer: Medicare Other | Admitting: Occupational Therapy

## 2018-07-06 DIAGNOSIS — K592 Neurogenic bowel, not elsewhere classified: Secondary | ICD-10-CM

## 2018-07-06 LAB — BASIC METABOLIC PANEL
Anion gap: 9 (ref 5–15)
BUN: 25 mg/dL — AB (ref 8–23)
CALCIUM: 8.7 mg/dL — AB (ref 8.9–10.3)
CO2: 27 mmol/L (ref 22–32)
Chloride: 96 mmol/L — ABNORMAL LOW (ref 98–111)
Creatinine, Ser: 0.78 mg/dL (ref 0.61–1.24)
GFR calc Af Amer: >60 mL/min (ref >60)
GLUCOSE: 120 mg/dL — AB (ref 70–99)
Potassium: 4.6 mmol/L (ref 3.5–5.1)
Sodium: 132 mmol/L — ABNORMAL LOW (ref 135–145)

## 2018-07-06 LAB — CBC
HEMATOCRIT: 36.8 % — AB (ref 39.0–52.0)
Hemoglobin: 12.1 g/dL — ABNORMAL LOW (ref 13.0–17.0)
MCH: 29.5 pg (ref 26.0–34.0)
MCHC: 32.9 g/dL (ref 30.0–36.0)
MCV: 89.8 fL (ref 78.0–100.0)
Platelets: 121 10*3/uL — ABNORMAL LOW (ref 150–400)
RBC: 4.1 MIL/uL — ABNORMAL LOW (ref 4.22–5.81)
RDW: 12.2 % (ref 11.5–15.5)
WBC: 11.2 10*3/uL — ABNORMAL HIGH (ref 4.0–10.5)

## 2018-07-06 MED ORDER — POLYETHYLENE GLYCOL 3350 17 G PO PACK
17.0000 g | PACK | Freq: Two times a day (BID) | ORAL | Status: DC
  Administered 2018-07-06 – 2018-07-13 (×13): 17 g via ORAL
  Filled 2018-07-06 (×13): qty 1

## 2018-07-06 NOTE — Progress Notes (Addendum)
Physical Therapy Session Note  Patient Details  Name: Trevor [name] MRN: [id number] Date of Birth: [birth year]  Today's Date: 07/06/2018 PT Individual Time: 0930-1010 PT Individual Time Calculation (min): 40 min   Short Term Goals: Week 2:  PT Short Term Goal 1 (Week 2): pt will perform functional transfers with supervision PT Short Term Goal 1 - Progress (Week 2): Progressing toward goal PT Short Term Goal 2 (Week 2): pt will perform sit <> stand with max A with RW PT Short Term Goal 2 - Progress (Week 2): Met PT Short Term Goal 3 (Week 2): pt will be supervision with w/c parts management PT Short Term Goal 3 - Progress (Week 2): Met  Skilled Therapeutic Interventions/Progress Updates:    pt initially refused therapy due to nausea/vomiting.  Pt then agreeable to "light" therapy this session.  Pt performed seated pelvic tilt initially with tactile cues, fading to visual cues to decrease posterior tilt in sitting.  Seated therex for LAQ, hip add squeeze and glute squeezes with pt making improvements in LE mm strength.  nustep x 8 minutes for LE strengthening, level 4.  Squat pivot transfers throughout session with min guard assist.  PT reviewed written HEP for pt's LE and core strengthening exercises, pt verbalizes understanding.  Pt left in room with needs at hand. Missed 15 minutes due to n/v.  Therapy Documentation Precautions:  Precautions Precautions: Fall Precaution Comments: spinal precautions: no bending, lifting, twisting, arching Restrictions Weight Bearing Restrictions: No Other Position/Activity Restrictions: No brace needed per orders General: PT Amount of Missed Time (min): 15 Minutes PT Missed Treatment Reason: Patient ill (Comment)(n/v) Pain:  no c/o pain  Therapy/Group: Individual Therapy  DONAWERTH,KAREN 07/06/2018, 10:18 AM

## 2018-07-06 NOTE — Progress Notes (Signed)
[facility] PHYSICAL MEDICINE & REHABILITATION     PROGRESS NOTE    Subjective/Complaints: Patient seen sitting up in his chair this morning. He states he slept well overnight. He complains of mild back pain. He also notes constipation.  ROS: + mild back pain, constipation. denies CP, SOB, nausea, vomiting, diarrhea.  Objective:  No results found. Recent Labs    07/05/18 0501 07/06/18 0604  WBC 12.4* 11.2*  HGB 12.4* 12.1*  HCT 38.2* 36.8*  PLT 126* 121*   Recent Labs    07/06/18 0604  NA 132*  K 4.6  CL 96*  GLUCOSE 120*  BUN 25*  CREATININE 0.78  CALCIUM 8.7*   CBG (last 3)  No results for input(s): GLUCAP in the last 72 hours.  Wt Readings from Last 3 Encounters:  06/22/18 82.7 kg (182 lb 4.8 oz)  06/22/18 81.6 kg (180 lb)  06/18/18 81.6 kg (180 lb)     Intake/Output Summary (Last 24 hours) at 07/06/2018 1015 Last data filed at 07/05/2018 1813 Gross per 24 hour  Intake 480 ml  Output -  Net 480 ml    Vital Signs: Blood pressure (!) 116/59, pulse (!) 56, temperature 98 F (36.7 C), temperature source Oral, resp. rate 18, height 5\' 8"  (1.727 m), weight 82.7 kg (182 lb 4.8 oz), SpO2 98 %. Physical Exam:  Constitutional: No distress . Vital signs reviewed. HENT: Normocephalic.  Atraumatic. Eyes: EOMI. No discharge. Cardiovascular: RRR. No JVD. Respiratory: CTA bilaterally. Normal effort. GI: BS +. Non-distended. Musc: mild tenderness around surgical site. Neurological:alert Speech clear.  Able to follow basic commands without difficulty. Motor: B/l 5/5 (stable).  B/l LE: Hip flexion 2/5, knee extension 3+/5, ankle dorsiflexion 4 -/5 (right stronger than left, unchanged) Sensation intact light touch Skin:No rashnoted. Surgical incision healing, with mild surrounding induration, no erythema, no warmth-stable Psych: Pleasant and appropriate  Assessment/Plan: 1. 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 Physiatrist is providing close team supervision and 24 hour management of active medical problems listed below. Physiatrist and rehab team continue to assess barriers to discharge/monitor patient progress toward functional and medical goals.  Function:  Bathing Bathing position   Position: Shower  Bathing parts Body parts bathed by patient: Left arm, Right arm, Abdomen, Chest, Front perineal area, Buttocks, Right upper leg, Left upper leg, Right lower leg, Left lower leg, Back Body parts bathed by helper: Back, Buttocks  Bathing assist Assist Level: Supervision or verbal cues Assistive Device Comment: LH sponge    Upper Body Dressing/Undressing Upper body dressing   What is the patient wearing?: Pull over shirt/dress     Pull over shirt/dress - Perfomed by patient: Thread/unthread right sleeve, Thread/unthread left sleeve, Put head through opening, Pull shirt over trunk          Upper body assist Assist Level: More than reasonable time   Set up : To obtain clothing/put away  Lower Body Dressing/Undressing Lower body dressing   What is the patient wearing?: Pants, Non-skid slipper socks     Pants- Performed by patient: Thread/unthread right pants leg, Thread/unthread left pants leg, Pull pants up/down Pants- Performed by helper: Pull pants up/down Non-skid slipper socks- Performed by patient: Don/doff left sock, Don/doff right sock Non-skid slipper socks- Performed by helper: Don/doff right sock, Don/doff left sock     Shoes - Performed by patient: Don/doff right shoe, Don/doff left shoe, Fasten right, Fasten left  TED Hose - Performed by patient: Don/doff right TED hose, Don/doff left TED hose TED Hose - Performed by helper: Don/doff left TED hose, Don/doff right TED hose  Lower body assist Assist for lower body dressing: Touching or steadying assistance (Pt > 75%)       Toileting Toileting Toileting activity did not occur: No continent bowel/bladder event Toileting steps completed by patient: Adjust clothing prior to toileting, Performs perineal hygiene, Adjust clothing after toileting Toileting steps completed by helper: Performs perineal hygiene Toileting Assistive Devices: Grab bar or rail  Toileting assist Assist level: Supervision or verbal cues   Transfers Chair/bed transfer   Chair/bed transfer method: Lateral scoot Chair/bed transfer assist level: Touching or steadying assistance (Pt > 75%) Chair/bed transfer assistive device: Armrests, Sliding board Mechanical lift: Stedy   Locomotion Ambulation Ambulation activity did not occur: Safety/medical concerns   Max distance: 30 Assist level: 2 helpers(modA, w/c follow)   Wheelchair   Type: Manual Max wheelchair distance: 150 Assist Level: No help, No cues, assistive device, takes more than reasonable amount of time  Cognition Comprehension Comprehension assist level: Follows complex conversation/direction with no assist  Expression Expression assist level: Expresses complex ideas: With no assist  Social Interaction Social Interaction assist level: Interacts appropriately with others - No medications needed.  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   Cont CIR 2. DVT Prophylaxis/Anticoagulation: Mechanical:Sequential compression devices, below kneeBilateral lower extremities 3. Pain Management:Celebrex d/ced   Decadron taper, last decreased on 7/24-plan to decrease again tomorrow  Kpad for back ordered 4. Mood:team to provide ego support. LCSW to follow for evaluation and support.  -sl increase trazodone for sleep disturbance  -encouraged him to use later if needed. 5. Neuropsych:  This patientiscapable of making decisions on hisown behalf. 6. Skin/Wound Care:monitor wound for healing. Added protein supplement to promote wound healing. 7. Fluids/Electrolytes/Nutrition:  -continue to push fluids.   -potassium 4.6 on 7/26  -celebrex d/ced 8. Urinary retention/BPH/neurogenic bladder:H/o hypospadias.  -Retention has resolved 9. HTN: Monitor BP bid--  and lisinopril. HCTZ stopped Vitals:   07/06/18 0555 07/06/18 0834  BP: 129/61 (!) 116/59  Pulse: (!) 56   Resp: 18   Temp: 98 F (36.7 C)   SpO2: 98%    Relatively controlled on 7/26 10. Steroid induced hypoglycemia on Prediabetes: CM diet. 11. GERD:  continue PPI due to h/o of gastritis and now on decadron. 12. Leukocytosis; Likely steroid induced,   WBCs 11.2 on 7/26, improving  Continue to monitor 13. Neurogenic bowel:   Bowel regimen increased on 7/26 14. Acute blood loss anemia  Hemoglobin 12.1 on 7/26  Cont to monitor 15. Thrombocytopenia  Platelets 121 on 7/26-? Labile at baseline  Labs ordered for Monday  Continue to monitor  LOS (Days) 14 A FACE TO FACE EVALUATION WAS PERFORMED  Devlyn Parish Lorie Phenix, MD 07/06/2018 10:15 AM

## 2018-07-06 NOTE — Progress Notes (Signed)
Occupational Therapy Session Note  Patient Details  Name: Trevor [name] MRN: [id number] Date of Birth: [birth year]  Today's Date: 07/06/2018  Session 1 OT Individual Time: 1100-1155 OT Individual Time Calculation (min): 55 min   Session 2 OT Individual Time: 9528-4132 OT Individual Time Calculation (min): 70 min    Short Term Goals: Week 2:  OT Short Term Goal 1 (Week 2): Pt will complete toilet/BSC transfer with no more than Mod A OT Short Term Goal 2 (Week 2): Pt will utilize leaning method in shower to wash buttocks OT Short Term Goal 3 (Week 2): Pt will complete 2 grooming tasks in supported standing  OT Short Term Goal 4 (Week 2): Pt will follow back precautions within BADL tasks with no more than min questioning cues  Skilled Therapeutic Interventions/Progress Updates:    Session 1 Pt greeted seated in wc and agreeable to OT. Pt states his nausea had improved since this morning. Pt requests to shower today. Pt able to complete lateral transfer into shower with min cues for wc positioning and close supervision. Bathing completed with set-up A only. Slideboard transfer out of shower with towel placed on board and assist to place board. Assistance to stabilize wc but pt able to manage transfer with supervision. Dressing completed wc at the sink using figure 4 method to access LB. Worked on standing at the sink, with OT blocking weaker L knee, but upon stand- pt's R LE buckled and OT had to lower pt safely to the floor. Called for assistance and nurse and nurse tech brought in sling and maximove. Maximove used to lift pt off the floor and back into wc. Pt with some swelling in LEs so TED hose donned using friction reducing device with verbal cues. Pt left seated in wc at end of session with lunch tray set-up.   Session 2 Pt greeted sitting in wc and agreeable to OT treatment session. Pt propelled wc to dayroom and worked on sit<>stand and standing endurance in standing frame. Pt tolerated  standing for 5-7 mins x3 with extended rest breaks in between. Participated in black jack card game while standing. LB there-ex focused on hip flexion with toe taps on medium cones. 3 sets of 10. Returned to room to collect clothing for laundry. Pt declined trying to stand at washer to load 2/2 fatigue. Pt requested to return to bed at end of session and completed slideboard transfer with assistance to place board and supervision to transfer. Pt left semi-reclined with bed alarm on and nursing present to administer pain meds.    Therapy Documentation Precautions:  Precautions Precautions: Fall Precaution Comments: spinal precautions: no bending, lifting, twisting, arching Restrictions Weight Bearing Restrictions: No Other Position/Activity Restrictions: No brace needed per orders Pain: Pain Assessment Pain Scale: 0-10 Pain Score: 5  Pain Type: Acute pain Pain Location: Back Pain Descriptors / Indicators: Aching Pain Frequency: Constant Pain Onset: On-going Patients Stated Pain Goal: 2 Pain Intervention(s): Reposition, notified RN ADL: ADL ADL Comments: Please see functional navigator for ADL status  See Function Navigator for Current Functional Status.   Therapy/Group: Individual Therapy  Valma Cava 07/06/2018, 3:27 PM

## 2018-07-06 NOTE — Progress Notes (Signed)
   07/06/18 1145  What Happened  Was fall witnessed? Yes  Who witnessed fall? Grayland Ormond Doe, OT  Patients activity before fall other (comment) (Standing at sink with OT)  Point of contact buttocks  Was patient injured? No (patient was assisted to the floor)  Follow Up  MD notified No  Family notified No- patient refusal  Additional tests No  Simple treatment Other (comment) (patient declined)  Progress note created (see row info) Yes  Adult Fall Risk Assessment  Risk Factor Category (scoring not indicated) Fall has occurred during this admission (document High fall risk)  Patient's Fall Risk High Fall Risk (>13 points)  Adult Fall Risk Interventions  Required Bundle Interventions *See Row Information* High fall risk - low, moderate, and high requirements implemented  Additional Interventions Use of appropriate toileting equipment (bedpan, BSC, etc.)  Screening for Fall Injury Risk (To be completed on HIGH fall risk patients) - Assessing Need for Low Bed  Risk For Fall Injury- Low Bed Criteria None identified - Continue screening  Screening for Fall Injury Risk (To be completed on HIGH fall risk patients who do not meet crieteria for Low Bed) - Assessing Need for Floor Mats Only  Risk For Fall Injury- Criteria for Floor Mats None identified - No additional interventions needed  Pain Assessment  Pain Scale 0-10  Pain Score 0  Neurological  Neuro (WDL) WDL  R Foot Dorsiflexion Moderate  L Foot Dorsiflexion Weak  R Foot Plantar Flexion Moderate  L Foot Plantar Flexion Weak  RLE Motor Response Purposeful movement  RLE Sensation Decreased  RLE Motor Strength 4  LLE Motor Response Purposeful movement  LLE Sensation Decreased  LLE Motor Strength 3  Neuro Symptoms None  Musculoskeletal Details  RLE Weakness  LLE Weakness  Integumentary  Integumentary (WDL) WDL (no new skin issues from assisted fall)

## 2018-07-06 NOTE — Progress Notes (Signed)
   07/06/18 1232  Vitals  BP (!) 105/59  MAP (mmHg) 72  BP Location Left Arm  BP Method Automatic  Patient Position (if appropriate) Sitting  Pulse Rate 71  Oxygen Therapy  SpO2 99 %  O2 Device Room Air   Post fall Vital Signs

## 2018-07-07 ENCOUNTER — Inpatient Hospital Stay (HOSPITAL_COMMUNITY): Payer: Medicare Other | Admitting: Occupational Therapy

## 2018-07-07 ENCOUNTER — Inpatient Hospital Stay (HOSPITAL_COMMUNITY): Payer: Medicare Other

## 2018-07-07 DIAGNOSIS — T8131XA Disruption of external operation (surgical) wound, not elsewhere classified, initial encounter: Secondary | ICD-10-CM

## 2018-07-07 DIAGNOSIS — G8918 Other acute postprocedural pain: Secondary | ICD-10-CM

## 2018-07-07 DIAGNOSIS — R52 Pain, unspecified: Secondary | ICD-10-CM

## 2018-07-07 DIAGNOSIS — R509 Fever, unspecified: Secondary | ICD-10-CM

## 2018-07-07 LAB — URINALYSIS, COMPLETE (UACMP) WITH MICROSCOPIC
Bacteria, UA: NONE SEEN
Glucose, UA: NEGATIVE mg/dL
Hgb urine dipstick: NEGATIVE
Ketones, ur: NEGATIVE mg/dL
Leukocytes, UA: NEGATIVE
Nitrite: NEGATIVE
PH: 6 (ref 5.0–8.0)
Protein, ur: NEGATIVE mg/dL
SPECIFIC GRAVITY, URINE: 1.025 (ref 1.005–1.030)

## 2018-07-07 MED ORDER — SENNOSIDES-DOCUSATE SODIUM 8.6-50 MG PO TABS
2.0000 | ORAL_TABLET | Freq: Two times a day (BID) | ORAL | Status: DC
  Administered 2018-07-07 – 2018-07-08 (×2): 2 via ORAL
  Administered 2018-07-08: 1 via ORAL
  Administered 2018-07-09 – 2018-07-13 (×8): 2 via ORAL
  Filled 2018-07-07 (×11): qty 2

## 2018-07-07 NOTE — Progress Notes (Signed)
RN called to room. Pt complained he is shaking and feels cold; 5/10 pain in his incision. Vital signs checked. Muscle relaxant, pain med and suppository given. Charge RN informed. Temp recheck was 99.6/oral. Rapid response RN was called by Agricultural consultant. Rectal temp was 100.97F

## 2018-07-07 NOTE — Progress Notes (Signed)
Occupational Therapy Session Note  Patient Details  Name: Trevor [name] MRN: [id number] Date of Birth: 01-24-1946  Today's Date: 07/07/2018 OT Individual Time: 5883-2549 OT Individual Time Calculation (min): 13 min  and Today's Date: 07/07/2018 OT Missed Time: 47 Minutes Missed Time Reason: Patient ill (comment);Patient fatigue; Pt off unit to x-ray  Short Term Goals: Week 2:  OT Short Term Goal 1 (Week 2): Pt will complete toilet/BSC transfer with no more than Mod A OT Short Term Goal 2 (Week 2): Pt will utilize leaning method in shower to wash buttocks OT Short Term Goal 3 (Week 2): Pt will complete 2 grooming tasks in supported standing  OT Short Term Goal 4 (Week 2): Pt will follow back precautions within BADL tasks with no more than min questioning cues  Skilled Therapeutic Interventions/Progress Updates:    Pt initially off the unit for x-ray. OT went to see pt after returning to room. Pt explained to OT that he has had a rough day, with pain, fever, and chills. Pt requests to brush his teeth so OT set pt up with toothbrush, toothpaste, and spit cup for pt to brush teeth without assistance. Pt declined further OT treatment session despite encouragement. Will follow up per plan of care.   Therapy Documentation Precautions:  Precautions Precautions: Fall Precaution Comments: spinal precautions: no bending, lifting, twisting, arching Restrictions Weight Bearing Restrictions: No Other Position/Activity Restrictions: No brace needed per orders General: General OT Amount of Missed Time: 47 Minutes Pain: Pain Assessment Pain Scale: 0-10 Pain Score: 3  Pain Type: Acute pain Pain Location: Back Pain Orientation: Lower Pain Descriptors / Indicators: Aching Pain Onset: On-going Pain Intervention(s): Repositioned ADL: ADL ADL Comments: Please see functional navigator for ADL status  See Function Navigator for Current Functional Status.   Therapy/Group: Individual  Therapy  Valma Cava 07/07/2018, 1:25 PM

## 2018-07-07 NOTE — Progress Notes (Signed)
[facility] PHYSICAL MEDICINE & REHABILITATION     PROGRESS NOTE    Subjective/Complaints: Patient seen lying in bed this morning. He states he did not sleep well overnight because he had chills earlier this morning with low-grade temperature. He states he feels better present. Discussed with nursing as well.  ROS: Denies CP, SOB, nausea, vomiting, diarrhea.  Objective:  No results found. Recent Labs    07/05/18 0501 07/06/18 0604  WBC 12.4* 11.2*  HGB 12.4* 12.1*  HCT 38.2* 36.8*  PLT 126* 121*   Recent Labs    07/06/18 0604  NA 132*  K 4.6  CL 96*  GLUCOSE 120*  BUN 25*  CREATININE 0.78  CALCIUM 8.7*   CBG (last 3)  No results for input(s): GLUCAP in the last 72 hours.  Wt Readings from Last 3 Encounters:  06/22/18 82.7 kg (182 lb 4.8 oz)  06/22/18 81.6 kg (180 lb)  06/18/18 81.6 kg (180 lb)     Intake/Output Summary (Last 24 hours) at 07/07/2018 1045 Last data filed at 07/07/2018 0109 Gross per 24 hour  Intake 840 ml  Output -  Net 840 ml    Vital Signs: Blood pressure (!) 100/55, pulse 65, temperature 99 F (37.2 C), resp. rate 20, height 5\' 8"  (1.727 m), weight 82.7 kg (182 lb 4.8 oz), SpO2 98 %. Physical Exam:  Constitutional: No distress . Vital signs reviewed. HENT: Normocephalic.  Atraumatic. Eyes: EOMI. No discharge. Cardiovascular: RRR. No JVD. Respiratory: CTA bilaterally. Normal effort. GI: BS +. Non-distended. Musc: minimal tenderness around surgical site. Neurological:alert Speech clear.  Able to follow basic commands without difficulty. Motor: B/l 5/5 (stable).  B/l LE: Hip flexion 2/5, knee extension 3+/5, ankle dorsiflexion 4 -/5 (right stronger than left, table) Sensation intact light touch Skin:No rashnoted. Surgical incision healing, with mild surrounding induration, no erythema, no warmth-unchanged Psych: Pleasant and appropriate  Assessment/Plan: 1. 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 Physiatrist is providing close team supervision and 24 hour management of active medical problems listed below. Physiatrist and rehab team continue to assess barriers to discharge/monitor patient progress toward functional and medical goals.  Function:  Bathing Bathing position   Position: Shower  Bathing parts Body parts bathed by patient: Left arm, Right arm, Abdomen, Chest, Front perineal area, Buttocks, Right upper leg, Left upper leg, Right lower leg, Left lower leg, Back Body parts bathed by helper: Back, Buttocks  Bathing assist Assist Level: Supervision or verbal cues Assistive Device Comment: LH sponge    Upper Body Dressing/Undressing Upper body dressing   What is the patient wearing?: Pull over shirt/dress     Pull over shirt/dress - Perfomed by patient: Thread/unthread right sleeve, Thread/unthread left sleeve, Put head through opening, Pull shirt over trunk          Upper body assist Assist Level: More than reasonable time   Set up : To obtain clothing/put away  Lower Body Dressing/Undressing Lower body dressing   What is the patient wearing?: Pants, Non-skid slipper socks     Pants- Performed by patient: Thread/unthread right pants leg, Thread/unthread left pants leg, Pull pants up/down Pants- Performed by helper: Pull pants up/down Non-skid slipper socks- Performed by patient: Don/doff left sock, Don/doff right sock Non-skid slipper socks- Performed by helper: Don/doff right sock, Don/doff left sock     Shoes - Performed by patient: Don/doff right shoe, Don/doff left shoe, Fasten right, [employer]  left       TED Hose - Performed by patient: Don/doff right TED hose, Don/doff left TED hose TED Hose - Performed by helper: Don/doff left TED hose, Don/doff right TED hose  Lower body assist Assist for lower body dressing: Touching or steadying assistance (Pt > 75%)       Toileting Toileting Toileting activity did not occur: No continent bowel/bladder event Toileting steps completed by patient: Adjust clothing prior to toileting, Performs perineal hygiene, Adjust clothing after toileting Toileting steps completed by helper: Performs perineal hygiene Toileting Assistive Devices: Grab bar or rail  Toileting assist Assist level: Supervision or verbal cues   Transfers Chair/bed transfer   Chair/bed transfer method: Lateral scoot Chair/bed transfer assist level: Touching or steadying assistance (Pt > 75%) Chair/bed transfer assistive device: Armrests, Sliding board Mechanical lift: Stedy   Locomotion Ambulation Ambulation activity did not occur: Safety/medical concerns   Max distance: 30 Assist level: 2 helpers(modA, w/c follow)   Wheelchair   Type: Manual Max wheelchair distance: 150 Assist Level: No help, No cues, assistive device, takes more than reasonable amount of time  Cognition Comprehension Comprehension assist level: Follows complex conversation/direction with no assist  Expression Expression assist level: Expresses complex ideas: With no assist  Social Interaction Social Interaction assist level: Interacts appropriately with others - No medications needed.  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   Cont CIR 2. DVT Prophylaxis/Anticoagulation: Mechanical:Sequential compression devices, below kneeBilateral lower extremities 3. Pain Management:Celebrex d/ced   Decadron taper, last decreased on 7/27  Kpad for back ordered 4. Mood:team to provide ego support. LCSW to follow for evaluation and support.  -sl increase trazodone for sleep disturbance  -encouraged him to use later if needed. 5. Neuropsych: This patientiscapable of  making decisions on hisown behalf. 6. Skin/Wound Care:monitor wound for healing. Added protein supplement to promote wound healing. 7. Fluids/Electrolytes/Nutrition:  -continue to push fluids.   -potassium 4.6 on 7/26  -celebrex d/ced 8. Urinary retention/BPH/neurogenic bladder:H/o hypospadias.  -Retention has resolved 9. HTN: Monitor BP bid--  and lisinopril. HCTZ stopped Vitals:   07/07/18 0633 07/07/18 0900  BP:  (!) 100/55  Pulse:    Resp:    Temp: 99 F (37.2 C) 99 F (37.2 C)  SpO2:     Labile on 7/27 10. Steroid induced hypoglycemia on Prediabetes: CM diet. 11. GERD:  continue PPI due to h/o of gastritis and now on decadron. 12. Leukocytosis; Likely steroid induced,   WBCs 11.2 on 7/26, improving  Continue to monitor  Labs ordered for Monday 13. Neurogenic bowel:   Bowel regimen increased on 7/26 14. Acute blood loss anemia  Hemoglobin 12.1 on 7/26  Cont to monitor 15. Thrombocytopenia  Platelets 121 on 7/26-? Labile at baseline  Labs ordered for Monday  Continue to monitor 16. Low-grade fever  Lumbar films ordered  UA/U culture ordered  Blood cultures ordered   LOS (Days) 15 A FACE TO FACE EVALUATION WAS PERFORMED  Ankit Lorie Phenix, MD 07/07/2018 10:45 AM

## 2018-07-07 NOTE — Progress Notes (Signed)
Dr. Posey Pronto informed of patient's status. Instructed RN to give him a call if pt become febrile.

## 2018-07-07 NOTE — Progress Notes (Signed)
Rapid Response RN at bedside. Pt given tylenol per Rapid RRN instruction. Will monitor. Awaiting further instruction.

## 2018-07-08 ENCOUNTER — Inpatient Hospital Stay (HOSPITAL_COMMUNITY): Payer: Medicare Other | Admitting: Occupational Therapy

## 2018-07-08 ENCOUNTER — Inpatient Hospital Stay (HOSPITAL_COMMUNITY): Payer: Medicare Other

## 2018-07-08 DIAGNOSIS — T8131XA Disruption of external operation (surgical) wound, not elsewhere classified, initial encounter: Secondary | ICD-10-CM | POA: Insufficient documentation

## 2018-07-08 LAB — BLOOD CULTURE ID PANEL (REFLEXED)
Acinetobacter baumannii: NOT DETECTED
CARBAPENEM RESISTANCE: NOT DETECTED
Candida albicans: NOT DETECTED
Candida glabrata: NOT DETECTED
Candida krusei: NOT DETECTED
Candida parapsilosis: NOT DETECTED
Candida tropicalis: NOT DETECTED
ENTEROBACTERIACEAE SPECIES: DETECTED — AB
ENTEROCOCCUS SPECIES: NOT DETECTED
Enterobacter cloacae complex: NOT DETECTED
Escherichia coli: NOT DETECTED
HAEMOPHILUS INFLUENZAE: NOT DETECTED
Klebsiella oxytoca: NOT DETECTED
Klebsiella pneumoniae: NOT DETECTED
LISTERIA MONOCYTOGENES: NOT DETECTED
NEISSERIA MENINGITIDIS: NOT DETECTED
PSEUDOMONAS AERUGINOSA: NOT DETECTED
Proteus species: DETECTED — AB
STAPHYLOCOCCUS SPECIES: NOT DETECTED
STREPTOCOCCUS PNEUMONIAE: NOT DETECTED
STREPTOCOCCUS PYOGENES: NOT DETECTED
STREPTOCOCCUS SPECIES: NOT DETECTED
Serratia marcescens: NOT DETECTED
Staphylococcus aureus (BCID): NOT DETECTED
Streptococcus agalactiae: NOT DETECTED

## 2018-07-08 LAB — URINE CULTURE

## 2018-07-08 MED ORDER — DOXYCYCLINE HYCLATE 100 MG PO TABS
100.0000 mg | ORAL_TABLET | Freq: Two times a day (BID) | ORAL | Status: DC
  Administered 2018-07-08: 100 mg via ORAL
  Filled 2018-07-08 (×2): qty 1

## 2018-07-08 MED ORDER — SODIUM CHLORIDE 0.9 % IV SOLN
2.0000 g | INTRAVENOUS | Status: AC
  Administered 2018-07-08 – 2018-07-10 (×3): 2 g via INTRAVENOUS
  Filled 2018-07-08 (×4): qty 20

## 2018-07-08 NOTE — Progress Notes (Signed)
Brief Note:  Informed by nursing regarding surgical wound dehiscence with drainage.  Wound culture order abx started.  Will monitor and consider surgery follow up tomorrow.

## 2018-07-08 NOTE — Discharge Summary (Addendum)
Physician Discharge Summary  Patient ID: Trevor [name] MRN: [id number] DOB/AGE: [birth year] 72 y.o.  Admit date: 06/22/2018 Discharge date: 07/13/2018  Discharge Diagnoses:  Principal Problem:   Herniation of lumbar intervertebral disc with radiculopathy Active Problems:   Sleep disturbance   Essential hypertension   Prediabetes   Leukocytosis   Thrombocytopenia (HCC)   Acute blood loss anemia   Neurogenic bowel   Postoperative pain   Wound dehiscence, surgical   Herpes stomatitis   Postoperative wound infection   Bacteremia   Discharged Condition: stable   Significant Diagnostic Studies: Mr Lumbar Spine W Wo Contrast  Result Date: 07/10/2018 CLINICAL DATA:  Bacteremia with severe lower back pain EXAM: MRI LUMBAR SPINE WITHOUT AND WITH CONTRAST TECHNIQUE: Multiplanar and multiecho pulse sequences of the lumbar spine were obtained without and with intravenous contrast. CONTRAST:  9mL MULTIHANCE GADOBENATE DIMEGLUMINE 529 MG/ML IV SOLN COMPARISON:  06/13/2018 FINDINGS: Segmentation:  5 lumbar type vertebral bodies Alignment: These mild scoliotic curvature better seen by radiography Vertebrae: Recent laminotomy at L2-3 for decompression. There is a rim enhancing fluid collection within the laminectomy defect, extending through the intrinsic back muscles to the subcutaneous space. The dominant deep collection measures 1 x 4 cm on axial slices. The extradural space is enhancing and thickened at L2-3 and there is radicular nerve root enhancement bilaterally extending cranially and caudally. The spinal canal is congenitally narrow and posterior element hypertrophy with small residual disc protrusion continues to cause severe spinal stenosis. Conus medullaris and cauda equina: Conus extends to the L1-2 level. Paraspinal and other soft tissues: As noted above Disc levels: T12- L1: Spondylosis.  Facet spurring.  No impingement L1-L2: Spondylosis and disc bulging with facet spurring. No impingement  L2-L3: Described above L3-L4: PLIF. Residual moderate spinal stenosis from short pedicles, posterior element hypertrophy, and mild residual ridging. The foramina appear patent L4-L5: PLIF. Bilateral subarticular recess narrowing and mild-to-moderate canal stenosis. The foramina are patent L5-S1:PLIF. The right L5 nerve root is difficult to appreciate separate from fibrosis and residual spurring. The canal is patent. IMPRESSION: 1. Fluid collection from subcutaneous space to the epidural space, sterility indeterminate in the setting of dehiscence. 2. Epidural enhancement at L2-3 level could be postoperative inflammation or infectious phlegmon. 3. Radiculitis with bilateral enhancing nerve roots centered at the postoperative level. 4. 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 Previously seen disc herniation has been decompressed but there is still severe spinal stenosis from posterior element hypertrophy, residual disc material, and the epidural soft tissue thickening. Electronically Signed   By: Monte Fantasia M.D.   On: 07/10/2018 09:47   Ct Aspiration  Result Date: 07/11/2018 INDICATION: Status post lumbar fusion on 06/20/2018 with draining lumbar wound and MRI demonstrating fluid collection in the subcutaneous space posterior to the L2-3 level. EXAM: CT-GUIDED ASPIRATION OF PARASPINOUS SOFT TISSUES MEDICATIONS: The patient is currently admitted to the hospital and receiving intravenous antibiotics. The antibiotics were administered within an appropriate time frame prior to the initiation of the procedure. ANESTHESIA/SEDATION: Fentanyl 50 mcg IV; Versed 2.0 mg IV Moderate Sedation Time:  10 minutes. The patient was continuously monitored during the procedure by the interventional radiology nurse under my direct supervision. COMPLICATIONS: None immediate. PROCEDURE: Informed written consent was obtained from the patient after a thorough discussion of the procedural risks, benefits and alternatives. All questions  were addressed. Maximal Sterile Barrier Technique was utilized including caps, mask, sterile gowns, sterile gloves, sterile drape, hand hygiene and skin antiseptic. A timeout was performed prior to the initiation of  the procedure. CT was performed in a prone position. Under CT guidance, an 18 gauge spinal needle was advanced into the posterior paraspinous soft tissues at the L2-3 level to the left of midline. Aspiration was performed. A fluid sample was sent for culture analysis. FINDINGS: Aspiration yielded approximately 1 mL of blood tinged fluid. IMPRESSION: CT-guided aspiration at the level of the draining wound in the subcutaneous paraspinal soft tissues. Aspiration yielded 1 mL of blood tinged fluid. This sample was sent for culture analysis. Electronically Signed   By: Aletta Edouard M.D.   On: 07/11/2018 15:36     Labs:  Basic Metabolic Panel: BMP Latest Ref Rng & Units 07/12/2018 07/10/2018 07/09/2018  Glucose 70 - 99 mg/dL 114(H) 99 109(H)  BUN 8 - 23 mg/dL 14 18 17   Creatinine 0.61 - 1.24 mg/dL 0.66 0.75 0.87  Sodium 135 - 145 mmol/L 136 134(L) 133(L)  Potassium 3.5 - 5.1 mmol/L 4.1 4.8 5.1  Chloride 98 - 111 mmol/L 101 98 95(L)  CO2 22 - 32 mmol/L 27 30 29   Calcium 8.9 - 10.3 mg/dL 8.7(L) 8.8(L) 8.7(L)    CBC: CBC Latest Ref Rng & Units 07/12/2018 07/10/2018 07/09/2018  WBC 4.0 - 10.5 K/uL 5.9 7.2 7.5  Hemoglobin 13.0 - 17.0 g/dL 11.8(L) 11.8(L) 11.5(L)  Hematocrit 39.0 - 52.0 % 36.1(L) 36.0(L) 35.6(L)  Platelets 150 - 400 K/uL 98(L) 85(L) 76(L)    CBG: No results for input(s): GLUCAP in the last 168 hours.  Brief HPI:   Trevor [name] is a 72 year old male with history of HTN, BPH, hypospadias, bladder cancer, DDD with radiculopathy and LLE weakness s/p lumbar decompression L3-S1 11/2017 and was doing well until he started having recurrent pain with radiculopathy bilateral lower extremity.  He was having problems with weakness and falls and was found to have acute L2/3 HNP.  He  underwent L2/3 discectomy on 06/20/2018 by Dr. Cari Caraway.   Postop course complicated by urinary retention requiring Foley placement by GU as well as worsening of left lower extremity weakness.  Decadron was added as decline felt to be due to traction response from surgery.  Therapy evaluations done with revealing significant decline in mobility and self-care tasks.  CIR was recommended for follow-up therapy   Hospital Course: Trevor [name] was admitted to rehab 06/22/2018 for inpatient therapies to consist of PT and OT at least three hours five days a week. Past admission physiatrist, therapy team and rehab RN have worked together to provide customized collaborative inpatient rehab.  Follow up labs showed pre renal azotemia and fluids were encouraged. Nutritional supplements were ordered to help promote wound healing. Bowel program was augmented to help manage constipation. Foley was removed on 7/14 and voiding function was monitored with PVR checks. He was voiding without difficulty.   Leucocytosis was felt to be due to to steroids and he was being monitored for signs of infection.  has been monitored and was noted to be resolving.   Celebrex was decreased to once a day at admission due to history of gastritis and GERD.  PPI was ordered for GI protection and renal status has been monitored.  He developed hyperkalemia therefore was treated with a dose of Kayexalate and Celebrex DC'd.  He developed fever with chills with increase in pain on 07/27 and was pan-cultured.  Urine culture was negative but blood cultures showed enterobacter and proteus species.  Wound culture done and was positive for Proteus mirabilis.  Lumbar films done 7/27 and showed post  op changes without no hardware failure.   He was started on IV doxycycline and IV Rocephin empirically.  MRI of the spine was ordered for work-up due to ongoing drainage and dehiscence of wound.  Infectious disease was disease was consulted for input and  recommended narrowing antibiotics of ceftriaxone as well as follow-up with surgeon for possible I&D.  Case was discussed with Dr. Cari Caraway as MRI showed evidence of fluid collection and he recommended consulting radiology for aspiration of fluid.  [facility] was unsuccessful and was only able to withdraw 1 cc of fluid.  Fluid sent for culture which is currently negative.  Wound care was consulted for input and recommended packing wound with iodoform gauze with twice daily dressing changes.  Serial CBC shows leukocytosis has resolved and thrombocytopenia is slowly improving.  No signs of bleeding noted.  He did have perioral rash question due to doxycycline.  He has also has multiple oral herpetic lesions and was started on Valtrex for treatment.  Lesions have crusted over with decrease in perioral pain.  Antibiotic regimen has been narrowed to amoxicillin per ID with recommendations to involve surgeon for surgical debridement.  Dr. Cari Caraway recommended transfer patient to [facility] for definitive treatment.  Arrangements were undertaken and patient was transferred to [facility] on 07/13/18.   Rehab course: During patient's stay in rehab weekly team conferences were held to monitor patient's progress, set goals and discuss barriers to discharge. At admission, patient required mod assist with basic self care task and he required max assist for bed mobility and squat pivot transfers. He has had improvement in activity tolerance, balance, postural control as well as ability to compensate for deficits. He has had improvement in functional use RLE  and LLE as well as improvement in awareness. He is able to complete ADL tasks with supervision and set up assist. He requires mod assist for sit to stand transfers and supervision with cues for SB transfers. He is able to ambulate 10' with +2 assist in parallel  bars.    Disposition: Transfer to [facility]  Diet: Carb Modified.   Wound Care: Cleanse with NS, pat gently  dry.  Fill defect with iodoform strip gauze (1/4 inch, Lawson # [id number]) for wicking of drainage.  Leave 2-inches of strip gauze rpotruding from wound for ease in removal. Top with folded gauze 4x4s, secure with medipore breathable tape. Change twice daily and PRN drainage strike through onto exterior of dressing.   Special Instructions: 1. Continue back  Precautions.  2. Continue Valtrex thorough 07/17/18 to complete 7 day treatment.   Allergies as of 07/13/2018      Reactions   Ceftriaxone       Medication List    STOP taking these medications   celecoxib 200 MG capsule Commonly known as:  CELEBREX   ketoconazole 2 % cream Commonly known as:  NIZORAL   lisinopril-hydrochlorothiazide 10-12.5 MG tablet Commonly known as:  PRINZIDE,ZESTORETIC   omeprazole 20 MG tablet Commonly known as:  PRILOSEC OTC Replaced by:  pantoprazole 40 MG tablet     TAKE these medications   acetaminophen 325 MG tablet Commonly known as:  TYLENOL Take 1-2 tablets (325-650 mg total) by mouth every 4 (four) hours as needed for mild pain.   acyclovir ointment 5 % Commonly known as:  ZOVIRAX Apply topically every 4 (four) hours for 5 days.   amoxicillin 500 MG capsule Commonly known as:  AMOXIL Take 1 capsule (500 mg total) by mouth every 8 (eight) hours.  lisinopril 10 MG tablet Commonly known as:  PRINIVIL,ZESTRIL Take 1 tablet (10 mg total) by mouth daily.   methocarbamol 500 MG tablet Commonly known as:  ROBAXIN Take 1 tablet (500 mg total) by mouth every 6 (six) hours as needed for muscle spasms.   multivitamin with minerals Tabs tablet Take 1 tablet by mouth daily.   OVER THE COUNTER MEDICATION Take 1.5 tablets by mouth daily as needed (constipation). PRUNEX   Oxycodone HCl 10 MG Tabs Take 1 tablet (10 mg total) by mouth every 4 (four) hours as needed. What changed:    medication strength  how much to take  when to take this  reasons to take this   pantoprazole 40 MG  tablet Commonly known as:  PROTONIX Take 1 tablet (40 mg total) by mouth daily. Replaces:  omeprazole 20 MG tablet   polyethylene glycol packet Commonly known as:  MIRALAX / GLYCOLAX Take 17 g by mouth 2 (two) times daily.   senna-docusate 8.6-50 MG tablet Commonly known as:  Senokot-S Take 2 tablets by mouth 2 (two) times daily. What changed:    how much to take  when to take this  reasons to take this   simvastatin 40 MG tablet Commonly known as:  ZOCOR Take 40 mg by mouth daily.   sucralfate 1 g tablet Commonly known as:  CARAFATE Take 1.5 g by mouth daily as needed.   tamsulosin 0.4 MG Caps capsule Commonly known as:  FLOMAX TAKE 1 CAPSULE DAILY   traMADol 50 MG tablet Commonly known as:  ULTRAM Take 1 tablet (50 mg total) by mouth every 6 (six) hours as needed for moderate pain.   traZODone 50 MG tablet Commonly known as:  DESYREL Take 1 tablet (50 mg total) by mouth at bedtime as needed for sleep. What changed:    medication strength  how much to take  when to take this  reasons to take this   triamcinolone 0.025 % cream Commonly known as:  KENALOG Apply topically 2 (two) times daily.   valACYclovir 500 MG tablet Commonly known as:  VALTREX Take 1 tablet (500 mg total) by mouth 3 (three) times daily.        Contact information for follow-up providers    Meredith Staggers, MD Follow up.   Specialty:  Physical Medicine and Rehabilitation Why: call as needed Contact information: [address] [place] [address] [phone]        Meade Maw, MD Follow up on 07/12/2018.   Specialty:  Neurosurgery Contact information: [place] [address] [phone]               Signed: Bary Leriche 07/13/2018, 12:26 PM

## 2018-07-08 NOTE — Progress Notes (Signed)
Physical Therapy Discharge Summary  Patient Details  Name: Trevor [name] MRN: [id number] Date of Birth: [birth year]  Date: 07/13/2018 PT Treatment time: 905-930 Calculated treatment time: 25 min   PT Treatment: Pt received sitting in WC and agreeable to PT. PT instructed pt in WC mobility in various environments including hall of rehab unit and through hospital gift shop Mod I in hall way and distant supervision assist from PT in simulated community environment for safety. Patient returned to room and left sitting in [facility] with call bell in reach and all needs met.      Patient has met 5 of 6 long term goals due to improved activity tolerance, improved balance, improved postural control, increased strength, ability to compensate for deficits, functional use of  right lower extremity and left lower extremity and improved coordination.  Patient to discharge at a wheelchair level supervision to contact guard assist.   Pt does not have support at home upon d/c for his current level and therefore will benefit from continued rehab at [facility]  Reasons goals not met: Gait goal not met due to pt still requires +2 assist for safety during gait trials. Using either parallel bars, rail for support or maxi sky for safety, pt able to advance BLE during gait with heavy reliance on UE's but demonstrates abrupt knee buckling at times.  Recommendation:  Patient will benefit from ongoing skilled PT services in skilled nursing facility setting to continue to advance safe functional mobility, address ongoing impairments in strength, balance, endurance, coordination, postural control, functional mobility, and minimize fall risk.  Equipment: No equipment provided. TBD at next venue of care.   Reasons for discharge: treatment goals met and discharge from hospital  Patient/family agrees with progress made and goals achieved: Yes  PT Discharge Precautions/Restrictions Precautions Precautions: Fall;Back Precaution  Comments: spinal precautions: no bending, lifting, twisting, arching Restrictions Weight Bearing Restrictions: No Vision/Perception  Perception Perception: Within Functional Limits Praxis Praxis: Intact  Cognition Overall Cognitive Status: Within Functional Limits for tasks assessed Memory: Impaired Memory Impairment: Decreased recall of new information Safety/Judgment: Appears intact Comments: still requires cues for recall of set-up and sequencing for transfers at times Sensation Sensation Light Touch: Appears Intact Proprioception: Appears Intact Coordination Gross Motor Movements are Fluid and Coordinated: No(BLE) Motor  Motor Motor: Paraplegia;Ataxia;Motor impersistence;Abnormal postural alignment and control Motor - Discharge Observations: incomplete paraplegia - LLE more involved than RLE  Mobility Bed Mobility Rolling Left: Supervision/Verbal cueing Supine to Sit: Supervision/Verbal cueing Sit to Supine: Supervision/Verbal cueing Transfers Transfers: Sit to Stand;Lateral/Scoot Transfers Sit to Stand: Moderate Assistance - Patient 50-74% Lateral/Scoot Transfers: Supervision/Verbal cueing Transfer (Assistive device): (slideboard) Locomotion  Gait Ambulation: Yes Gait Assistance: 2 Helpers Gait Distance (Feet): 10 Feet Assistive device: Parallel bars Gait Assistance Details: Verbal cues for precautions/safety;Verbal cues for gait pattern Gait Assistance Details: +2 needed for close w/c follow due to BLE buckling suddenly under patient. Requires min to mod assist for gait with parallel bars/rail or in maxi sky Stairs / Additional Locomotion Stairs: No Wheelchair Mobility Wheelchair Mobility: Yes Wheelchair Assistance: Independent with Camera operator: Both upper extremities Wheelchair Parts Management: Supervision/cueing Distance: >150'  Trunk/Postural Assessment  Cervical Assessment Cervical Assessment: Within Functional Limits Thoracic  Assessment Thoracic Assessment: Exceptions to WFL(back precautions) Lumbar Assessment Lumbar Assessment: Exceptions to WFL(back precautions) Postural Control Postural Control: Deficits on evaluation  Balance Balance Balance Assessed: Yes Static Sitting Balance Static Sitting - Level of Assistance: 6: Modified independent (Device/Increase time) Dynamic Sitting Balance Dynamic Sitting -  Level of Assistance: 6: Modified independent (Device/Increase time) Static Standing Balance Static Standing - Level of Assistance: 4: Min assist(with strong reliance on UE support) Dynamic Standing Balance Dynamic Standing - Level of Assistance: 3: Mod assist(heavy reliance on UE support) Extremity Assessment      RLE Assessment RLE Assessment: Exceptions to [facility] Passive Range of Motion (PROM) Comments: [facility] General Strength Comments: hip flexion 2-/5 and knee extension 3-/5; otherwise grossly 4/5 LLE Assessment LLE Assessment: Exceptions to [facility] Passive Range of Motion (PROM) Comments: [facility] General Strength Comments: hip flexion and knee extension 2-/5; 3+/5 otherwise   See Function Navigator for Current Functional Status.  Lars Masson, PT, DPT 07/08/2018, 12:14 PM

## 2018-07-08 NOTE — Progress Notes (Signed)
[facility]ICAL [facility]ICINE [facility] REHABILITATION     PROGRESS NOTE    Subjective/Complaints: Patient seen lying in bed this morning. He states he slept fairly overnight. He notes some pain this a.m., but has not asked for pain medications yet. He states he feels much better than yesterday.  ROS: denies CP, SOB, nausea, vomiting, diarrhea.  Objective:  Dg Lumbar Spine Complete  Result Date: 07/07/2018 CLINICAL DATA:  Low back pain EXAM: LUMBAR SPINE - COMPLETE 4+ VIEW COMPARISON:  06/20/2018 FINDINGS: There are changes consistent with prior fusion from L3-S1. No hardware failure is seen. No compression deformity is noted. Facet hypertrophic changes are seen. No acute abnormality noted. IMPRESSION: Postoperative and degenerative changes without acute abnormality. Electronically Signed   By: Inez Catalina M.D.   On: 07/07/2018 14:57   Recent Labs    07/06/18 0604  WBC 11.2*  HGB 12.1*  HCT 36.8*  PLT 121*   Recent Labs    07/06/18 0604  NA 132*  K 4.6  CL 96*  GLUCOSE 120*  BUN 25*  CREATININE 0.78  CALCIUM 8.7*   CBG (last 3)  No results for input(s): GLUCAP in the last 72 hours.  Wt Readings from Last 3 Encounters:  06/22/18 82.7 kg (182 lb 4.8 oz)  06/22/18 81.6 kg (180 lb)  06/18/18 81.6 kg (180 lb)     Intake/Output Summary (Last 24 hours) at 07/08/2018 0731 Last data filed at 07/08/2018 0616 Gross per 24 hour  Intake 840 ml  Output -  Net 840 ml    Vital Signs: Blood pressure 124/62, pulse 75, temperature 98.6 F (37 C), temperature source Oral, resp. rate 20, height 5\' 8"  (1.727 m), weight 82.7 kg (182 lb 4.8 oz), SpO2 99 %. Physical Exam:  Constitutional: No distress . Vital signs reviewed. HENT: Normocephalic.  Atraumatic. Eyes: EOMI. No discharge. Cardiovascular: RRR. No JVD. Respiratory: CTA bilaterally. Normal effort. GI: BS +. Non-distended. Musc: minimal tenderness around surgical site. Neurological:alert Speech clear.  Able to follow basic  commands without difficulty. Motor:  B/l LE: Hip flexion 2/5, knee extension 3+/5, ankle dorsiflexion 4 -/5 (right stronger than left, unchanged) Sensation intact light touch Skin:No rashnoted. Surgical incision healing, with mild surrounding induration, no erythema, no warmth-unchanged Psych: Pleasant and appropriate  Assessment/Plan: 1. 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 Physiatrist is providing close team supervision and 24 hour management of active medical problems listed below. Physiatrist and rehab team continue to assess barriers to discharge/monitor patient progress toward functional and medical goals.  Function:  Bathing Bathing position   Position: Shower  Bathing parts Body parts bathed by patient: Left arm, Right arm, Abdomen, Chest, Front perineal area, Buttocks, Right upper leg, Left upper leg, Right lower leg, Left lower leg, Back Body parts bathed by helper: Back, Buttocks  Bathing assist Assist Level: Supervision or verbal cues Assistive Device Comment: LH sponge    Upper Body Dressing/Undressing Upper body dressing   What is the patient wearing?: Pull over shirt/dress     Pull over shirt/dress - Perfomed by patient: Thread/unthread right sleeve, Thread/unthread left sleeve, Put head through opening, Pull shirt over trunk          Upper body assist Assist Level: More than reasonable time   Set up : To obtain clothing/put away  Lower Body Dressing/Undressing Lower body dressing   What is the patient wearing?: Pants, Non-skid slipper socks  Pants- Performed by patient: Thread/unthread right pants leg, Thread/unthread left pants leg, Pull pants up/down Pants- Performed by helper: Pull pants up/down Non-skid slipper socks- Performed by patient: Don/doff left sock, Don/doff right sock Non-skid slipper socks- Performed by helper:  Don/doff right sock, Don/doff left sock     Shoes - Performed by patient: Don/doff right shoe, Don/doff left shoe, Fasten right, Fasten left       TED Hose - Performed by patient: Don/doff right TED hose, Don/doff left TED hose TED Hose - Performed by helper: Don/doff left TED hose, Don/doff right TED hose  Lower body assist Assist for lower body dressing: Touching or steadying assistance (Pt > 75%)      Toileting Toileting Toileting activity did not occur: No continent bowel/bladder event Toileting steps completed by patient: Adjust clothing prior to toileting, Performs perineal hygiene, Adjust clothing after toileting Toileting steps completed by helper: Performs perineal hygiene, Adjust clothing after toileting Toileting Assistive Devices: Grab bar or rail  Toileting assist Assist level: Supervision or verbal cues   Transfers Chair/bed transfer   Chair/bed transfer method: Lateral scoot Chair/bed transfer assist level: Touching or steadying assistance (Pt > 75%) Chair/bed transfer assistive device: Armrests, Sliding board Mechanical lift: Stedy   Locomotion Ambulation Ambulation activity did not occur: Safety/medical concerns   Max distance: 30 Assist level: 2 helpers(modA, w/c follow)   Wheelchair   Type: Manual Max wheelchair distance: 150 Assist Level: No help, No cues, assistive device, takes more than reasonable amount of time  Cognition Comprehension Comprehension assist level: Follows complex conversation/direction with no assist  Expression Expression assist level: Expresses complex ideas: With no assist  Social Interaction Social Interaction assist level: Interacts appropriately with others - No medications needed.  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    Cont CIR 2. DVT Prophylaxis/Anticoagulation: Mechanical:Sequential compression devices, below kneeBilateral lower extremities 3. Pain Management:Celebrex d/ced   Decadron taper, last decreased on 7/27  Kpad for back ordered 4. Mood:team to provide ego support. LCSW to follow for evaluation and support.  -sl increase trazodone for sleep disturbance  -encouraged him to use later if needed. 5. Neuropsych: This patientiscapable of making decisions on hisown behalf. 6. Skin/Wound Care:monitor wound for healing. Added protein supplement to promote wound healing. 7. Fluids/Electrolytes/Nutrition:  -continue to push fluids.   -potassium 4.6 on 7/26  -celebrex d/ced 8. Urinary retention/BPH/neurogenic bladder:H/o hypospadias.  -Retention has resolved 9. HTN: Monitor BP bid--  and lisinopril. HCTZ stopped Vitals:   07/07/18 1941 07/08/18 0543  BP: 100/63 124/62  Pulse: 79 75  Resp:  20  Temp:  98.6 F (37 C)  SpO2:  99%   Controlled on 7/28 10. Steroid induced hypoglycemia on Prediabetes: CM diet. 11. GERD:  continue PPI due to h/o of gastritis and now on decadron. 12. Leukocytosis; Likely steroid induced,   WBCs 11.2 on 7/26, improving  Continue to monitor  Labs ordered for tomorrow 13. Neurogenic bowel:   Bowel regimen increased on 7/26 14. Acute blood loss anemia  Hemoglobin 12.1 on 7/26  Cont to monitor 15. Thrombocytopenia  Platelets 121 on 7/26-? Labile at baseline  Labs ordered for tomorrow  Continue to monitor 16. Low-grade fever with chills  Improving  Lumbar films reviewed, stable postoperative changes  UA negative, urine culture pending  Blood cultures pending   LOS (Days) 16 A FACE  TO FACE EVALUATION WAS PERFORMED  Elmor Kost Lorie Phenix, MD 07/08/2018 7:31 AM

## 2018-07-08 NOTE — Progress Notes (Signed)
Occupational Therapy Session Note  Patient Details  Name: Trevor [name] MRN: [id number] Date of Birth: January 24, 1946  Today's Date: 07/08/2018 OT Individual Time: 1335-1430 OT Individual Time Calculation (min): 55 min    Skilled Therapeutic Interventions/Progress Updates:    Pt greeted supine in bed with 2/10 pain in back. Premedicated. Fatigued but no nausea. He completed bathing/dressing EOB, using lateral leans for pericare and LB dressing with supervision/setup. Practiced slideboard transfers to drop arm commode and padded tub shower bench also. He required vcs and assist for board placement during transfers. Discussed advocating for drop arm commode if [name] is not available at next venue of care. That way he can retain functional independence with toilet transfers using slideboard. At end of session pt returned to bed and was left with all needs within reach.   Therapy Documentation Precautions:  Precautions Precautions: Fall, Back Precaution Comments: spinal precautions: no bending, lifting, twisting, arching Restrictions Weight Bearing Restrictions: No Other Position/Activity Restrictions: No brace needed per orders General: General Chart Reviewed: Yes PT Missed Treatment Reason: Pain;Patient ill (Comment);Patient fatigue Family/Caregiver Present: No Vital Signs: Therapy Vitals Temp: 99.1 F (37.3 C) Temp Source: Oral Pulse Rate: 98 Resp: 18 BP: 107/60 Patient Position (if appropriate): Lying Oxygen Therapy SpO2: 97 % O2 Device: Room Air Pain: He took 2 rest breaks in sidelying during session   ADL: ADL ADL Comments: Please see functional navigator for ADL status Vision Baseline Vision/History: Wears glasses Wears Glasses: At all times Patient Visual Report: No change from baseline Perception  Perception: Within Functional Limits Praxis Praxis: Intact     See Function Navigator for Current Functional Status.   Therapy/Group: Individual Therapy  Vartan Kerins A  Kennedi Lizardo 07/08/2018, 3:02 PM

## 2018-07-08 NOTE — Progress Notes (Signed)
Physical Therapy Session Note  Patient Details  Name: Trevor [name] MRN: [id number] Date of Birth: 1946-11-13  Today's Date: 07/08/2018 PT Individual Time: 1100-1150 PT Individual Time Calculation (min): 50 min   Short Term Goals: Week 3:  PT Short Term Goal 1 (Week 3): = LTG  Skilled Therapeutic Interventions/Progress Updates:    Pt reluctant but agreeable to participate in session today stating he feels better than yesterday but still not feeling well. Focused on w/c mobility on unit for functional mobility training at modified independent level, slideboard transfers at close supervision level with cues for set-up and requires some assist with correct slideboard placement, functional bed mobility re-training on flat surface with cues for technique to adhere to back precautions, NMR for sit <> stands and short distance gait training in parallel bars (min/mod assist overall for gait with PT blocking at knees and close w/c follow due to h/o of knees buckling suddenly x 10' forwards and backwards), and simulated car transfer to sedan height with slideboard with focus on technique and cues for safety requiring overall min assist due to uneven surface. End of session returned back to bed and pt missed last 10 min due to pain/fatigue/and nausea. RN aware.   Therapy Documentation Precautions:  Precautions Precautions: Fall, Back Precaution Comments: spinal precautions: no bending, lifting, twisting, arching Restrictions Weight Bearing Restrictions: No Other Position/Activity Restrictions: No brace needed per orders General: PT Amount of Missed Time (min): 10 Minutes PT Missed Treatment Reason: Pain;Patient ill (Comment);Patient fatigue Pain: C/o pain in back and stomach - RN aware and addressed with medication. Pt also somewhat nauseous still.    See Function Navigator for Current Functional Status.   Therapy/Group: Individual Therapy  Canary Brim Ivory Broad, PT,  DPT  07/08/2018, 12:02 PM

## 2018-07-08 NOTE — Plan of Care (Signed)
All LTGs achieved 07/08/18

## 2018-07-08 NOTE — Progress Notes (Signed)
CNA reported pt back was bleeding around 14:30. Nurse observed thin light tan drainage continuously coming from pt incision site. Pt stated the area is sore but not hurting as much as yesterday. Pt rated his current pain as a 4. Fluid was gently pushed out. Area was cleaned with NS and covered with dry dressing. No change in behavior or breathing. Pt continuous to be alert and orient to self, environment and staff. No c/o tremors or dizziness.  At 16:45 dressing and tee shirt was saturated with light tan fluids. Area was clean again with NS and dry dressing was applied. Dark yellow slough is present inside incision. Area is also now raised and slightly hard in the middle. Pt still denies and pain or changes. No foul odor present. MD will be contacted.

## 2018-07-08 NOTE — Progress Notes (Signed)
PHARMACY - PHYSICIAN COMMUNICATION CRITICAL VALUE ALERT - BLOOD CULTURE IDENTIFICATION (BCID)  Trevor [name] is an 72 y.o. male who presented to CIR on 06/22/2018 for functional decline s/p discectomy. Patient noted to have fever, chills, and surgical wound drainage. Cultures ordered. BCx now with 1/4 GNR and BCID Proteus species.  Name of physician (or Provider) ContactedPosey Pronto  Current antibiotics: PO doxycycline  Changes to prescribed antibiotics recommended:  Discontinue doxycycline and start ceftriaxone 2g IV q24h. Recommendations declined by provider - wants to continue doxycycline for MRSA coverage due to wound dehiscence. Will also start ceftriaxone to cover Proteus.   Results for orders placed or performed during the hospital encounter of 06/22/18  Blood Culture ID Panel (Reflexed) (Collected: 07/07/2018 11:10 AM)  Result Value Ref Range   Enterococcus species NOT DETECTED NOT DETECTED   Listeria monocytogenes NOT DETECTED NOT DETECTED   Staphylococcus species NOT DETECTED NOT DETECTED   Staphylococcus aureus NOT DETECTED NOT DETECTED   Streptococcus species NOT DETECTED NOT DETECTED   Streptococcus agalactiae NOT DETECTED NOT DETECTED   Streptococcus pneumoniae NOT DETECTED NOT DETECTED   Streptococcus pyogenes NOT DETECTED NOT DETECTED   Acinetobacter baumannii NOT DETECTED NOT DETECTED   Enterobacteriaceae species DETECTED (A) NOT DETECTED   Enterobacter cloacae complex NOT DETECTED NOT DETECTED   Escherichia coli NOT DETECTED NOT DETECTED   Klebsiella oxytoca NOT DETECTED NOT DETECTED   Klebsiella pneumoniae NOT DETECTED NOT DETECTED   Proteus species DETECTED (A) NOT DETECTED   Serratia marcescens NOT DETECTED NOT DETECTED   Carbapenem resistance NOT DETECTED NOT DETECTED   Haemophilus influenzae NOT DETECTED NOT DETECTED   Neisseria meningitidis NOT DETECTED NOT DETECTED   Pseudomonas aeruginosa NOT DETECTED NOT DETECTED   Candida albicans NOT DETECTED NOT DETECTED   Candida glabrata NOT DETECTED NOT DETECTED   Candida krusei NOT DETECTED NOT DETECTED   Candida parapsilosis NOT DETECTED NOT DETECTED   Candida tropicalis NOT DETECTED NOT DETECTED   Erin N. Gerarda Fraction, PharmD PGY2 Infectious Diseases Pharmacy Resident Phone: [phone] 07/08/2018  6:29 PM

## 2018-07-08 NOTE — Progress Notes (Signed)
Dr. Posey Pronto was called. Nurse was unaware that a message was allowed. Nurse will call MD back if no call back within the hour. Pt remains calm and shows no signs of distress.

## 2018-07-08 NOTE — Progress Notes (Signed)
Occupational Therapy Discharge Summary  Patient Details  Name: Trevor [name] MRN: [id number] Date of Birth: 03/26/1946  Patient has met 8 of 8 long term goals due to improved activity tolerance, improved balance, postural control, ability to compensate for deficits and improved awareness.  Patient to discharge at overall Supervision level.  Patient to d/c SNF for next venue of care.     All goals met.   Recommendation:  Patient will benefit from ongoing skilled OT services in assisted living facility to continue to advance functional skills in the area of BADL.  Equipment: No equipment provided  Reasons for discharge: treatment goals met and discharge from hospital  Patient/family agrees with progress made and goals achieved: Yes  OT Discharge Precautions/Restrictions  Precautions Precautions: Fall;Back Precaution Comments: spinal precautions: no bending, lifting, twisting, arching Restrictions Weight Bearing Restrictions: No Other Position/Activity Restrictions: No brace needed per orders Vital Signs Therapy Vitals Temp: 99.1 F (37.3 C) Temp Source: Oral Pulse Rate: 98 Resp: 18 BP: 107/60 Patient Position (if appropriate): Lying Oxygen Therapy SpO2: 97 % O2 Device: Room Air ADL ADL ADL Comments: Please see functional navigator for ADL status Vision Baseline Vision/History: Wears glasses Wears Glasses: At all times Patient Visual Report: No change from baseline Perception  Perception: Within Functional Limits Praxis Praxis: Intact Cognition Overall Cognitive Status: Within Functional Limits for tasks assessed Arousal/Alertness: Awake/alert Orientation Level: Oriented X4 Sustained Attention: Appears intact Memory: Impaired Memory Impairment: Decreased recall of new information Safety/Judgment: Appears intact Sensation Sensation Light Touch: Appears Intact Hot/Cold: Appears Intact Proprioception: Appears Intact Coordination Gross Motor Movements are Fluid  and Coordinated: No Fine Motor Movements are Fluid and Coordinated: Yes Motor  Motor Motor: Paraplegia;Ataxia;Abnormal postural alignment and control Motor - Discharge Observations: incomplete paraplegia - LLE more involved than RLE Trunk/Postural Assessment  Cervical Assessment Cervical Assessment: Within Functional Limits Thoracic Assessment Thoracic Assessment: Exceptions to WFL(N/A due to back precautions) Lumbar Assessment Lumbar Assessment: Exceptions to WFL(N/A due to back precautions) Postural Control Postural Control: Deficits on evaluation  Balance Balance Balance Assessed: Yes Dynamic Sitting Balance Dynamic Sitting - Balance Support: Feet supported;During functional activity(Bathing/dressing EOB) Dynamic Sitting - Level of Assistance: 6: Modified independent (Device/Increase time)  RUE Assessment RUE Assessment: Within Functional Limits LUE Assessment LUE Assessment: Within Functional Limits   See Function Navigator for Current Functional Status.  Michaela A Hoffman 07/08/2018, 2:54 PM

## 2018-07-09 DIAGNOSIS — I340 Nonrheumatic mitral (valve) insufficiency: Secondary | ICD-10-CM

## 2018-07-09 DIAGNOSIS — Z981 Arthrodesis status: Secondary | ICD-10-CM

## 2018-07-09 DIAGNOSIS — T8131XS Disruption of external operation (surgical) wound, not elsewhere classified, sequela: Secondary | ICD-10-CM

## 2018-07-09 DIAGNOSIS — Z8551 Personal history of malignant neoplasm of bladder: Secondary | ICD-10-CM

## 2018-07-09 DIAGNOSIS — N401 Enlarged prostate with lower urinary tract symptoms: Secondary | ICD-10-CM

## 2018-07-09 DIAGNOSIS — D72823 Leukemoid reaction: Secondary | ICD-10-CM

## 2018-07-09 DIAGNOSIS — T8149XA Infection following a procedure, other surgical site, initial encounter: Secondary | ICD-10-CM

## 2018-07-09 DIAGNOSIS — Z8739 Personal history of other diseases of the musculoskeletal system and connective tissue: Secondary | ICD-10-CM

## 2018-07-09 DIAGNOSIS — R350 Frequency of micturition: Secondary | ICD-10-CM

## 2018-07-09 DIAGNOSIS — R202 Paresthesia of skin: Secondary | ICD-10-CM

## 2018-07-09 LAB — CBC
HCT: 35.6 % — ABNORMAL LOW (ref 39.0–52.0)
Hemoglobin: 11.5 g/dL — ABNORMAL LOW (ref 13.0–17.0)
MCH: 29.4 pg (ref 26.0–34.0)
MCHC: 32.3 g/dL (ref 30.0–36.0)
MCV: 91 fL (ref 78.0–100.0)
PLATELETS: 76 10*3/uL — AB (ref 150–400)
RBC: 3.91 MIL/uL — ABNORMAL LOW (ref 4.22–5.81)
RDW: 12 % (ref 11.5–15.5)
WBC: 7.5 10*3/uL (ref 4.0–10.5)

## 2018-07-09 LAB — BASIC METABOLIC PANEL
Anion gap: 9 (ref 5–15)
BUN: 17 mg/dL (ref 8–23)
CHLORIDE: 95 mmol/L — AB (ref 98–111)
CO2: 29 mmol/L (ref 22–32)
CREATININE: 0.87 mg/dL (ref 0.61–1.24)
Calcium: 8.7 mg/dL — ABNORMAL LOW (ref 8.9–10.3)
GFR calc Af Amer: >60 mL/min (ref >60)
Glucose, Bld: 109 mg/dL — ABNORMAL HIGH (ref 70–99)
Potassium: 5.1 mmol/L (ref 3.5–5.1)
SODIUM: 133 mmol/L — AB (ref 135–145)

## 2018-07-09 MED ORDER — TRIAMCINOLONE ACETONIDE 0.025 % EX CREA
TOPICAL_CREAM | Freq: Two times a day (BID) | CUTANEOUS | Status: DC
  Administered 2018-07-09 – 2018-07-13 (×7): via TOPICAL
  Filled 2018-07-09: qty 15

## 2018-07-09 NOTE — Progress Notes (Signed)
[facility] PHYSICAL MEDICINE & REHABILITATION     PROGRESS NOTE    Subjective/Complaints: Had chills and rigors over weekend. Low grade temp. New drainage from wound  ROS: Patient denies fever, rash, sore throat, blurred vision, nausea, vomiting, diarrhea, cough, shortness of breath or chest pain, joint or back pain, headache, or mood change.   Objective:  Dg Lumbar Spine Complete  Result Date: 07/07/2018 CLINICAL DATA:  Low back pain EXAM: LUMBAR SPINE - COMPLETE 4+ VIEW COMPARISON:  06/20/2018 FINDINGS: There are changes consistent with prior fusion from L3-S1. No hardware failure is seen. No compression deformity is noted. Facet hypertrophic changes are seen. No acute abnormality noted. IMPRESSION: Postoperative and degenerative changes without acute abnormality. Electronically Signed   By: Inez Catalina M.D.   On: 07/07/2018 14:57   Recent Labs    07/09/18 0548  WBC 7.5  HGB 11.5*  HCT 35.6*  PLT 76*   Recent Labs    07/09/18 0548  NA 133*  K 5.1  CL 95*  GLUCOSE 109*  BUN 17  CREATININE 0.87  CALCIUM 8.7*   CBG (last 3)  No results for input(s): GLUCAP in the last 72 hours.  Wt Readings from Last 3 Encounters:  06/22/18 82.7 kg (182 lb 4.8 oz)  06/22/18 81.6 kg (180 lb)  06/18/18 81.6 kg (180 lb)     Intake/Output Summary (Last 24 hours) at 07/09/2018 0853 Last data filed at 07/08/2018 2200 Gross per 24 hour  Intake 1299.98 ml  Output -  Net 1299.98 ml    Vital Signs: Blood pressure 121/69, pulse 63, temperature 98 F (36.7 C), temperature source Oral, resp. rate 18, height 5\' 8"  (1.727 m), weight 82.7 kg (182 lb 4.8 oz), SpO2 99 %. Physical Exam:  Constitutional: No distress . Vital signs reviewed. HEENT: EOMI, oral membranes moist Neck: supple Cardiovascular: RRR without murmur. No JVD    Respiratory: CTA Bilaterally without wheezes or rales. Normal effort    GI: BS +, non-tender, non-distended  Musc: minimal tenderness around surgical  site. Neurological:alert Speech clear.  Able to follow basic commands without difficulty. Motor:  B/l LE: Hip flexion 2/5, knee extension 3+/5, ankle dorsiflexion 4 -/5 (right stronger than left, stable Sensation intact light touch Skin:surgical incision with dehiscence, about 1" long. Orange colored drainage, appears mostly serous from wound, no odor.  Psych: Pleasant and appropriate  Assessment/Plan: 1. 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 Physiatrist is providing close team supervision and 24 hour management of active medical problems listed below. Physiatrist and rehab team continue to assess barriers to discharge/monitor patient progress toward functional and medical goals.  Function:  Bathing Bathing position   Position: Sitting EOB  Bathing parts Body parts bathed by patient: Left arm, Right arm, Abdomen, Chest, Front perineal area, Buttocks, Right upper leg, Left upper leg, Right lower leg, Left lower leg, Back Body parts bathed by helper: Back, Buttocks  Bathing assist Assist Level: Supervision or verbal cues Assistive Device Comment: LH sponge    Upper Body Dressing/Undressing Upper body dressing   What is the patient wearing?: Pull over shirt/dress     Pull over shirt/dress - Perfomed by patient: Thread/unthread right sleeve, Thread/unthread left sleeve, Put head through opening, Pull shirt over trunk          Upper body assist Assist Level: More than reasonable time   Set up : To obtain clothing/put away  Lower  Body Dressing/Undressing Lower body dressing   What is the patient wearing?: Pants, Non-skid slipper socks     Pants- Performed by patient: Thread/unthread right pants leg, Thread/unthread left pants leg, Pull pants up/down Pants- Performed by helper: Pull pants up/down Non-skid slipper socks- Performed by patient: Don/doff  left sock, Don/doff right sock Non-skid slipper socks- Performed by helper: Don/doff right sock, Don/doff left sock     Shoes - Performed by patient: Don/doff right shoe, Don/doff left shoe, Fasten right, Fasten left       TED Hose - Performed by patient: Don/doff right TED hose, Don/doff left TED hose TED Hose - Performed by helper: Don/doff left TED hose, Don/doff right TED hose  Lower body assist Assist for lower body dressing: Supervision or verbal cues, Set up   Set up : To obtain clothing/put away  Toileting Toileting Toileting activity did not occur: No continent bowel/bladder event Toileting steps completed by patient: Adjust clothing prior to toileting, Performs perineal hygiene, Adjust clothing after toileting Toileting steps completed by helper: Adjust clothing after toileting Toileting Assistive Devices: Grab bar or rail  Toileting assist Assist level: Supervision or verbal cues   Transfers Chair/bed transfer   Chair/bed transfer method: Lateral scoot Chair/bed transfer assist level: Supervision or verbal cues Chair/bed transfer assistive device: Armrests, Sliding board Mechanical lift: Stedy   Locomotion Ambulation Ambulation activity did not occur: Safety/medical concerns   Max distance: 10' Assist level: 2 helpers(min/mod of 1 with +2 for close w/c follow)   Wheelchair   Type: Manual Max wheelchair distance: 150 Assist Level: No help, No cues, assistive device, takes more than reasonable amount of time  Cognition Comprehension Comprehension assist level: Follows complex conversation/direction with no assist  Expression Expression assist level: Expresses complex ideas: With no assist  Social Interaction Social Interaction assist level: Interacts appropriately with others - No medications needed.  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   Will need to hold on SNF transfer given wound issue 2. DVT Prophylaxis/Anticoagulation: Mechanical:Sequential compression devices, below kneeBilateral lower extremities 3. Pain Management:Celebrex d/ced   Decadron taper to off.  Kpad for back ordered 4. Mood:team to provide ego support. LCSW to follow for evaluation and support.  -sl increase trazodone for sleep disturbance  -encouraged him to use later if needed. 5. Neuropsych: This patientiscapable of making decisions on hisown behalf. 6.  Wound Care:Back/surgical wound dehiscence over weekend.   -wbc's normal, platelets down to 71k  -order MRI of spine  -on empiric doxycycline and ceftriaxone  -contact surgeon re: recs  -afebrile at present but low grade temps over weekend with chills and rigors  -recent plain films normal   -7. Fluids/Electrolytes/Nutrition:  -continue to push fluids.   -potassium 4.6 on 7/26  -celebrex d/ced 8. Urinary retention/BPH/neurogenic bladder:H/o hypospadias.  -Retention has resolved 9. HTN: Monitor BP bid--  and lisinopril. HCTZ stopped Vitals:   07/08/18 1959 07/09/18 0449  BP: (!) 94/54 121/69  Pulse: 82 63  Resp:  18  Temp: 98 F (36.7 C) 98 F (36.7 C)  SpO2: 98% 99%   Controlled on 7/28 10. Steroid induced hypoglycemia on Prediabetes: CM diet. 11. GERD:  continue PPI due to h/o of gastritis   13. Neurogenic bowel:   Bowel regimen increased on 7/26 14. Acute blood loss anemia  Hemoglobin 11.5 today  Cont to monitor  15. Thrombocytopenia  71k today,  16. Low-grade fever with chills   -see above    urine culture multispecies  Blood cultures pending   LOS (Days) [place] EVALUATION WAS PERFORMED  Meredith Staggers, MD 07/09/2018 8:53 AM

## 2018-07-09 NOTE — Consult Note (Signed)
[facility] for Infectious Disease    Date of Admission:  06/22/2018     Total days of antibiotics                Reason for Consult: Surgical Wound Infection  Referring Provider: Naaman Plummer Primary Care Provider: Derinda Late, MD   Assessment/Plan:  Trevor [name] is a 72 y/o male with previous medical history of degenerative disc disease and previous L3-S1 fusion surgery who underwent L2-L3 discectomy on 06/20/18. He has been in rehabilitation and noted to have increasing drainage from his surgical site with rigors/chills.   Surgical wound infection - Blood cultures with gram negative rods with identification pending. Urine culture with multiple species indicating likely contamination. Started on doxycycline and ceftriaxone yesterday. MRI has been ordered and pending. He currently has no signs of sepsis. He has been afebrile with no leukocytosis. Proteus has been noted on cultures. Discontinue doxycycline and continue ceftriaxone. Repeat blood cultures in the morning. Recommend follow up with surgeon for possible I&D. He is going to require prolonged course of therapy pending treatment plan and MRI results.    Lip tingling/swelling - New onset lip tingling in the setting of started ceftriaxone and doxycycline in the past 24 hours. There is some concern for antibiotic reaction. He has tolerated Cefazolin in the past and has no current allergies that he is aware of. Continue to monitor at this time.   Active Problems:   Herniation of lumbar intervertebral disc with radiculopathy   Sleep disturbance   Essential hypertension   Steroid-induced hyperglycemia   Prediabetes   Leukocytosis   Thrombocytopenia (HCC)   Acute blood loss anemia   Neurogenic bowel   Pain   Low grade fever   Postoperative pain   Rupture of operation wound   . dexamethasone  2 mg Oral Daily  . doxycycline  100 mg Oral Q12H  . lisinopril  10 mg Oral Daily  . multivitamin with minerals  1 tablet Oral Daily    . pantoprazole  40 mg Oral Daily  . polyethylene glycol  17 g Oral BID  . senna-docusate  2 tablet Oral BID  . simvastatin  40 mg Oral Daily  . tamsulosin  0.4 mg Oral Daily  . traZODone  100 mg Oral QHS     HPI: Trevor [name] is a 72 y.o. 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   Trevor [name] was initially admitted to [facility] for L2-L3 discectomy on 06/20/18 after having worsening lumbar stenosis resulting in weakness. He previously underwent L3-S1 fusion. Following surgery he was discharged to [facility].   He had an elevated temperature 100.3 on 07/07/18 otherwise he has remained afebrile. Urine and blood cultures were drawn with urine showing multiple species and the gram stain and cultures positive for gram negative rods. He was started on doxycycline and ceftriaxone. Blood pressures have been labile with most current being 121/69. MRI has been ordered and is pending.   Trevor [name] noted drainage starting either 1-2 days ago. He denies increased pain, fevers, or chills.   Review of Systems: Review of Systems  Constitutional: Positive for chills. Negative for fever and malaise/fatigue.  Respiratory: Negative for cough, sputum production, shortness of breath and wheezing.   Cardiovascular: Negative for chest pain and leg swelling.  Musculoskeletal: Negative for  back pain.  Skin: Negative for rash.  Neurological: Negative for tingling and weakness.     Past Medical History:  Diagnosis Date  . Acne   . Allergic rhinitis   . Anemia   . Anxiety   . BPH (benign prostatic hypertrophy) with urinary obstruction   . 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 Carpal tunnel syndrome   . Cervicalgia   . Degenerative disc disease, cervical    neck and back;   .  Depression    controlled;   Marland Kitchen Gastritis   . GERD (gastroesophageal reflux disease)   . Hemorrhoids   . History of malignant neoplasm of bladder   . Hyperlipidemia   . Hypertension    controlled with medication;   . Hypospadias   . Low back pain   . Mitral regurgitation   . Pre-diabetes   . Sleep apnea   . Urinary frequency     Social History   Tobacco Use  . Smoking status: Never Smoker  . Smokeless tobacco: Never Used  Substance Use Topics  . Alcohol use: No    Alcohol/week: 0.0 oz  . Drug use: No    Family History  Problem Relation Age of Onset  . Hyperlipidemia Brother   . Hyperlipidemia Sister   . Heart attack Father     No Known Allergies  OBJECTIVE: Blood pressure 121/69, pulse 63, temperature 98 F (36.7 C), temperature source Oral, resp. rate 18, height 5\' 8"  (1.727 m), weight 182 lb 4.8 oz (82.7 kg), SpO2 99 %.  Physical Exam  Constitutional: He is oriented to person, place, and time. He appears well-developed and well-nourished. No distress.  Cardiovascular: Normal rate, regular rhythm, normal heart sounds and intact distal pulses.  Pulmonary/Chest: Effort normal and breath sounds normal.  Musculoskeletal:  Low back - Dressing in place with shadowing present. Evaluation of the wound with drainage which appears purulent. No induration or tenderness. Distal pulses and sensation are intact and appropriate.   Neurological: He is alert and oriented to person, place, and time.  Skin: Skin is warm and dry.  Psychiatric: He has a normal mood and affect. His behavior is normal. Judgment and thought content normal.    Lab Results Lab Results  Component Value Date   WBC 7.5 07/09/2018   HGB 11.5 (L) 07/09/2018   HCT 35.6 (L) 07/09/2018   MCV 91.0 07/09/2018   PLT 76 (L) 07/09/2018    Lab Results  Component Value Date   CREATININE 0.87 07/09/2018   BUN 17 07/09/2018   NA 133 (L) 07/09/2018   K 5.1 07/09/2018   CL 95 (L) 07/09/2018   CO2 29 07/09/2018      Lab Results  Component Value Date   ALT 23 06/23/2018   AST 26 06/23/2018   ALKPHOS 61 06/23/2018   BILITOT 0.6 06/23/2018     Microbiology: Recent Results (from the past 240 hour(s))  Culture, blood (routine x 2)     Status: Abnormal (Preliminary result)   Collection Time: 07/07/18 11:10 AM  Result Value Ref Range Status   Specimen Description BLOOD LEFT ANTECUBITAL  Final   Special Requests   Final    BOTTLES DRAWN AEROBIC ONLY Blood Culture adequate volume   Culture  Setup Time   Final    GRAM NEGATIVE RODS AEROBIC BOTTLE ONLY CRITICAL RESULT CALLED TO, READ BACK BY AND VERIFIED WITH: Fayne Mediate [id number] MLM Performed at [facility], [address] [place], [place] [address]    Culture PROTEUS MIRABILIS (A)  Final   Report Status PENDING  Incomplete  Blood Culture ID Panel (Reflexed)     Status: Abnormal   Collection Time: 07/07/18 11:10 AM  Result Value Ref Range Status   Enterococcus species NOT DETECTED NOT DETECTED Final   Listeria monocytogenes NOT DETECTED NOT DETECTED Final   Staphylococcus species NOT DETECTED NOT DETECTED Final   Staphylococcus aureus NOT DETECTED NOT DETECTED Final   Streptococcus species NOT DETECTED NOT DETECTED Final   Streptococcus agalactiae NOT DETECTED NOT DETECTED Final   Streptococcus pneumoniae NOT DETECTED NOT DETECTED Final   Streptococcus pyogenes NOT DETECTED NOT DETECTED Final   Acinetobacter baumannii NOT DETECTED NOT DETECTED Final   Enterobacteriaceae species DETECTED (A) NOT DETECTED Final    Comment: Enterobacteriaceae represent a large family of gram-negative bacteria, not a single organism. CRITICAL RESULT CALLED TO, READ BACK BY AND VERIFIED WITH: PHARMD M BITONTI [id number] MLM    Enterobacter cloacae complex NOT DETECTED NOT DETECTED Final   Escherichia coli NOT DETECTED NOT DETECTED Final   Klebsiella oxytoca NOT DETECTED NOT DETECTED Final   Klebsiella pneumoniae NOT DETECTED NOT DETECTED Final    Proteus species DETECTED (A) NOT DETECTED Final    Comment: CRITICAL RESULT CALLED TO, READ BACK BY AND VERIFIED WITH: PHARMD M BITONTI [id number] MLM    Serratia marcescens NOT DETECTED NOT DETECTED Final   Carbapenem resistance NOT DETECTED NOT DETECTED Final   Haemophilus influenzae NOT DETECTED NOT DETECTED Final   Neisseria meningitidis NOT DETECTED NOT DETECTED Final   Pseudomonas aeruginosa NOT DETECTED NOT DETECTED Final   Candida albicans NOT DETECTED NOT DETECTED Final   Candida glabrata NOT DETECTED NOT DETECTED Final   Candida krusei NOT DETECTED NOT DETECTED Final   Candida parapsilosis NOT DETECTED NOT DETECTED Final   Candida tropicalis NOT DETECTED NOT DETECTED Final    Comment: Performed at [facility], [place] [address], [place], [place] [address]  Culture, blood (routine x 2)     Status: None (Preliminary result)   Collection Time: 07/07/18 11:15 AM  Result Value Ref Range Status   Specimen Description BLOOD LEFT ANTECUBITAL  Final   Special Requests   Final    BOTTLES DRAWN AEROBIC ONLY Blood Culture adequate volume   Culture  Setup Time   Final    GRAM NEGATIVE RODS AEROBIC BOTTLE ONLY Performed at [facility], [address] [place], [place] [address]    Culture GRAM NEGATIVE RODS  Final   Report Status PENDING  Incomplete  Urine Culture     Status: Abnormal   Collection Time: 07/07/18  3:07 PM  Result Value Ref Range Status   Specimen Description URINE, CLEAN CATCH  Final   Special Requests   Final    NONE Performed at [facility], [address], [place],  [address]    Culture MULTIPLE SPECIES PRESENT, SUGGEST RECOLLECTION (A)  Final   Report Status 07/08/2018 FINAL  Final  Aerobic Culture (superficial specimen)     Status: None (Preliminary result)   Collection Time: 07/08/18  6:10 PM  Result Value Ref Range Status   Specimen Description BACK  Final   Special Requests Normal  Final   Gram Stain   Final    NO WBC  SEEN NO ORGANISMS SEEN Performed at [facility], [place] [address], [place],  [address]    Culture PENDING  Incomplete  Report Status PENDING  Incomplete     Terri Piedra, NP [facility] [phone] Pager  07/09/2018  9:21 AM

## 2018-07-09 NOTE — Progress Notes (Signed)
Called MRI @ 2050 to check to see when they were going to be available to get patient. Tech was unsure stated that they had two patients ahead of him and he has been waiting all day.  Tech also stated that the other patients were on the broad since yesterday 7/28. Tech finally called back at 2055. But no update on status yet.

## 2018-07-09 NOTE — Progress Notes (Addendum)
   Discussed patient with [name] who felt that small dehisced area was not in continuity to hardware and may be able to be treated with antibiotics and wound VAC.  He will await MRI results prior to further recommendations.   Picture of wound taken for records:  tissue edges with fibronecrotic tissue. Able to probe centrally -- 8 mm tract noted and able to express stream of serosanguinous fluid. Dressing from this morning was soaked thorough--  Will order qid dressing changes.

## 2018-07-09 NOTE — Progress Notes (Signed)
Patient required 4 dressing changes today. Soaked with serous fluid 2 split gauze. Last dressing change 1845

## 2018-07-09 NOTE — Plan of Care (Signed)
Dehisced incision site

## 2018-07-09 NOTE — Progress Notes (Signed)
Social Work Patient ID: Trevor [name], male   DOB: 1946/09/11, 72 y.o.   MRN: [id number]  Informed by PA that pt with new medical issues and is not stable to transfer to SNF today as planned.  Have contacted the facility to cancel admission.  Pt and family aware will await medical clearance.  Embry Manrique, LCSW

## 2018-07-09 NOTE — Progress Notes (Signed)
Wound evaluated. Patient continues to have moderate amount of serosanguineous drainage from wound with mild circumferal erythema due to irritation. Able to express some fluid --no tenderness or pain elicited. Discussed concerns with patient as well as need to hold discharge today.   Discussed patient with Dr. Linus Salmons who recommended MRI spine for work up as well as input from surgeon regarding need for I & D due to ongoing drainage. Message left with Dr. Sheran Spine in procedure and will call back later for input.

## 2018-07-09 NOTE — Progress Notes (Signed)
Occupational Therapy Note  Patient Details  Name: Trevor [name] MRN: [id number] Date of Birth: [birth year]  Today's Date: 07/09/2018 OT Missed Time: 62 Minutes Missed Time Reason: Patient unwilling/refused to participate without medical reason  Pt was scheduled to d/c today, but due to medical issues, pt's d/c is held. Still awaiting MRI. Pt declines OT intervention at this time. Pt missed 60 mins of skilled OT.     Darleen Crocker P 07/09/2018, 1:23 PM

## 2018-07-09 NOTE — Progress Notes (Signed)
Physical Therapy Note  Patient Details  Name: Trevor [name] MRN: [id number] Date of Birth: [birth year] Today's Date: 07/09/2018    Pt was scheduled to d/c today, but due to medical issues, pt's d/c is held. Still awaiting MRI. Checked on pt and offered any bed level activities, declines stating he just doesn't feel up to it today and would like to defer tomorrow. Pt missed 120 min of skilled PT.   Lars Masson, PT, DPT  07/09/2018, 1:19 PM

## 2018-07-10 ENCOUNTER — Inpatient Hospital Stay (HOSPITAL_COMMUNITY): Payer: Medicare Other | Admitting: Occupational Therapy

## 2018-07-10 ENCOUNTER — Inpatient Hospital Stay (HOSPITAL_COMMUNITY): Payer: Medicare Other

## 2018-07-10 ENCOUNTER — Inpatient Hospital Stay (HOSPITAL_COMMUNITY): Payer: Medicare Other | Admitting: Physical Therapy

## 2018-07-10 DIAGNOSIS — T8130XD Disruption of wound, unspecified, subsequent encounter: Secondary | ICD-10-CM

## 2018-07-10 DIAGNOSIS — T8149XD Infection following a procedure, other surgical site, subsequent encounter: Secondary | ICD-10-CM

## 2018-07-10 DIAGNOSIS — R21 Rash and other nonspecific skin eruption: Secondary | ICD-10-CM

## 2018-07-10 DIAGNOSIS — R7881 Bacteremia: Secondary | ICD-10-CM

## 2018-07-10 LAB — BASIC METABOLIC PANEL
ANION GAP: 6 (ref 5–15)
BUN: 18 mg/dL (ref 8–23)
CHLORIDE: 98 mmol/L (ref 98–111)
CO2: 30 mmol/L (ref 22–32)
Calcium: 8.8 mg/dL — ABNORMAL LOW (ref 8.9–10.3)
Creatinine, Ser: 0.75 mg/dL (ref 0.61–1.24)
GFR calc Af Amer: >60 mL/min (ref >60)
GLUCOSE: 99 mg/dL (ref 70–99)
POTASSIUM: 4.8 mmol/L (ref 3.5–5.1)
Sodium: 134 mmol/L — ABNORMAL LOW (ref 135–145)

## 2018-07-10 LAB — CULTURE, BLOOD (ROUTINE X 2)
SPECIAL REQUESTS: ADEQUATE
Special Requests: ADEQUATE

## 2018-07-10 LAB — CBC WITH DIFFERENTIAL/PLATELET
Abs Immature Granulocytes: 0.1 10*3/uL (ref 0.0–0.1)
BASOS PCT: 0 %
Basophils Absolute: 0 10*3/uL (ref 0.0–0.1)
EOS ABS: 0.1 10*3/uL (ref 0.0–0.7)
Eosinophils Relative: 1 %
HCT: 36 % — ABNORMAL LOW (ref 39.0–52.0)
Hemoglobin: 11.8 g/dL — ABNORMAL LOW (ref 13.0–17.0)
Immature Granulocytes: 1 %
LYMPHS PCT: 14 %
Lymphs Abs: 1 10*3/uL (ref 0.7–4.0)
MCH: 29.5 pg (ref 26.0–34.0)
MCHC: 32.8 g/dL (ref 30.0–36.0)
MCV: 90 fL (ref 78.0–100.0)
Monocytes Absolute: 0.7 10*3/uL (ref 0.1–1.0)
Monocytes Relative: 10 %
NEUTROS ABS: 5.4 10*3/uL (ref 1.7–7.7)
Neutrophils Relative %: 74 %
PLATELETS: 85 10*3/uL — AB (ref 150–400)
RBC: 4 MIL/uL — AB (ref 4.22–5.81)
RDW: 11.9 % (ref 11.5–15.5)
WBC: 7.2 10*3/uL (ref 4.0–10.5)

## 2018-07-10 MED ORDER — AMOXICILLIN 250 MG PO CAPS
500.0000 mg | ORAL_CAPSULE | Freq: Three times a day (TID) | ORAL | Status: DC
  Administered 2018-07-11 – 2018-07-13 (×6): 500 mg via ORAL
  Filled 2018-07-10 (×6): qty 2

## 2018-07-10 MED ORDER — VALACYCLOVIR HCL 500 MG PO TABS
500.0000 mg | ORAL_TABLET | Freq: Three times a day (TID) | ORAL | Status: DC
  Administered 2018-07-10 – 2018-07-13 (×8): 500 mg via ORAL
  Filled 2018-07-10 (×11): qty 1

## 2018-07-10 MED ORDER — GADOBENATE DIMEGLUMINE 529 MG/ML IV SOLN
20.0000 mL | Freq: Once | INTRAVENOUS | Status: AC
  Administered 2018-07-10: 17 mL via INTRAVENOUS

## 2018-07-10 MED ORDER — ACYCLOVIR 5 % EX OINT
TOPICAL_OINTMENT | CUTANEOUS | Status: DC
  Administered 2018-07-10 – 2018-07-13 (×15): via TOPICAL
  Filled 2018-07-10: qty 15

## 2018-07-10 NOTE — Progress Notes (Signed)
[facility]   Reason for visit: Follow up on wound infection  Interval History: MRI with a fluid collection down to the epidural space associated with the dehiscence.  WBC 7.2, afebrile. Blood culture sensitivities noted and pansensitive.  No new complaints.    Physical Exam: Constitutional:  Vitals:   07/09/18 1934 07/10/18 0456  BP: (!) 108/58 127/68  Pulse: 76 60  Resp: 16 18  Temp: 98.4 F (36.9 C) 97.8 F (36.6 C)  SpO2: 98% 100%   patient appears in NAD HENT: less rash on face Respiratory: Normal respiratory effort; CTA B Cardiovascular: RRR GI: soft, nt, nd  Review of Systems: Constitutional: negative for fevers and chills Gastrointestinal: negative for nausea and diarrhea Integument/breast: negative for rash  Lab Results  Component Value Date   WBC 7.2 07/10/2018   HGB 11.8 (L) 07/10/2018   HCT 36.0 (L) 07/10/2018   MCV 90.0 07/10/2018   PLT 85 (L) 07/10/2018    Lab Results  Component Value Date   CREATININE 0.75 07/10/2018   BUN 18 07/10/2018   NA 134 (L) 07/10/2018   K 4.8 07/10/2018   CL 98 07/10/2018   CO2 30 07/10/2018    Lab Results  Component Value Date   ALT 23 06/23/2018   AST 26 06/23/2018   ALKPHOS 61 06/23/2018     Microbiology: Recent Results (from the past 240 hour(s))  Culture, blood (routine x 2)     Status: Abnormal   Collection Time: 07/07/18 11:10 AM  Result Value Ref Range Status   Specimen Description BLOOD LEFT ANTECUBITAL  Final   Special Requests   Final    BOTTLES DRAWN AEROBIC ONLY Blood Culture adequate volume   Culture  Setup Time   Final    GRAM NEGATIVE RODS AEROBIC BOTTLE ONLY CRITICAL RESULT CALLED TO, READ BACK BY AND VERIFIED WITH: Fayne Mediate [id number] MLM Performed at [facility], [address], [place], [place] [address]    Culture PROTEUS MIRABILIS (A)  Final   Report Status 07/10/2018 FINAL  Final   Organism ID, Bacteria PROTEUS MIRABILIS  Final   Susceptibility   Proteus mirabilis - MIC*    AMPICILLIN <=2 SENSITIVE Sensitive     CEFAZOLIN <=4 SENSITIVE Sensitive     CEFEPIME <=1 SENSITIVE Sensitive     CEFTAZIDIME <=1 SENSITIVE Sensitive     CEFTRIAXONE <=1 SENSITIVE Sensitive     CIPROFLOXACIN <=0.25 SENSITIVE Sensitive     GENTAMICIN <=1 SENSITIVE Sensitive     IMIPENEM 2 SENSITIVE Sensitive     TRIMETH/SULFA <=20 SENSITIVE Sensitive     AMPICILLIN/SULBACTAM <=2 SENSITIVE Sensitive     PIP/TAZO <=4 SENSITIVE Sensitive     * PROTEUS MIRABILIS  Blood Culture ID Panel (Reflexed)     Status: Abnormal   Collection Time: 07/07/18 11:10 AM  Result Value Ref Range Status   Enterococcus species NOT DETECTED NOT DETECTED Final   Listeria monocytogenes NOT DETECTED NOT DETECTED Final   Staphylococcus species NOT DETECTED NOT DETECTED Final   Staphylococcus aureus NOT DETECTED NOT DETECTED Final   Streptococcus species NOT DETECTED NOT DETECTED Final   Streptococcus agalactiae NOT DETECTED NOT DETECTED Final   Streptococcus pneumoniae NOT DETECTED NOT DETECTED Final   Streptococcus pyogenes NOT DETECTED NOT DETECTED Final   Acinetobacter baumannii NOT DETECTED NOT DETECTED Final   Enterobacteriaceae species DETECTED (A) NOT DETECTED Final    Comment: Enterobacteriaceae represent a large family of gram-negative bacteria, not a  single organism. CRITICAL RESULT CALLED TO, READ BACK BY AND VERIFIED WITH: PHARMD M BITONTI [id number] MLM    Enterobacter cloacae complex NOT DETECTED NOT DETECTED Final   Escherichia coli NOT DETECTED NOT DETECTED Final   Klebsiella oxytoca NOT DETECTED NOT DETECTED Final   Klebsiella pneumoniae NOT DETECTED NOT DETECTED Final   Proteus species DETECTED (A) NOT DETECTED Final    Comment: CRITICAL RESULT CALLED TO, READ BACK BY AND VERIFIED WITH: PHARMD M BITONTI [id number] MLM    Serratia marcescens NOT DETECTED NOT DETECTED Final   Carbapenem resistance NOT DETECTED NOT DETECTED Final   Haemophilus  influenzae NOT DETECTED NOT DETECTED Final   Neisseria meningitidis NOT DETECTED NOT DETECTED Final   Pseudomonas aeruginosa NOT DETECTED NOT DETECTED Final   Candida albicans NOT DETECTED NOT DETECTED Final   Candida glabrata NOT DETECTED NOT DETECTED Final   Candida krusei NOT DETECTED NOT DETECTED Final   Candida parapsilosis NOT DETECTED NOT DETECTED Final   Candida tropicalis NOT DETECTED NOT DETECTED Final    Comment: Performed at [facility], [place] [address], [place], [place] [address]  Culture, blood (routine x 2)     Status: Abnormal   Collection Time: 07/07/18 11:15 AM  Result Value Ref Range Status   Specimen Description BLOOD LEFT ANTECUBITAL  Final   Special Requests   Final    BOTTLES DRAWN AEROBIC ONLY Blood Culture adequate volume   Culture  Setup Time GRAM NEGATIVE RODS AEROBIC BOTTLE ONLY   Final   Culture (A)  Final    PROTEUS MIRABILIS SUSCEPTIBILITIES PERFORMED ON PREVIOUS CULTURE WITHIN THE LAST 5 DAYS. Performed at [facility], [place] [address], [place], [place] [address]    Report Status 07/10/2018 FINAL  Final  Urine Culture     Status: Abnormal   Collection Time: 07/07/18  3:07 PM  Result Value Ref Range Status   Specimen Description URINE, CLEAN CATCH  Final   Special Requests   Final    NONE Performed at [facility], [place] [address] [place], [place] [address]    Culture MULTIPLE SPECIES PRESENT, SUGGEST RECOLLECTION (A)  Final   Report Status 07/08/2018 FINAL  Final  Aerobic Culture (superficial specimen)     Status: None (Preliminary result)   Collection Time: 07/08/18  6:10 PM  Result Value Ref Range Status   Specimen Description BACK  Final   Special Requests Normal  Final   Gram Stain NO WBC SEEN NO ORGANISMS SEEN   Final   Culture   Final    MODERATE GRAM NEGATIVE RODS IDENTIFICATION AND SUSCEPTIBILITIES TO FOLLOW Performed at [facility], [place] [address], [place], [place] [address]    Report Status PENDING   Incomplete    Impression/Plan:  1. Wound infection - MRI findings noted.  Infection based on fluid collection on MRI, bacteremia and pus draining from dehisced incision.  I feel that debridement is indicated as it is an infection that goes down to the epidural space.   I will have him continue with amoxicillin  He will need to see his surgeon this week for consideration of surgical debridement Duration of antibiotics will depend on surgical intervention and findings.   Please refer to our clinic outpatient at that time or call ID after debridement or if inpatient again thanks  2.  Rash - some improvement.  Can use Valtrex for 3-5 days

## 2018-07-10 NOTE — Progress Notes (Signed)
Physical Therapy Session Note  Patient Details  Name: Trevor [name] MRN: [id number] Date of Birth: 07/25/1946  Today's Date: 07/10/2018 PT Individual Time: 1100-1200 PT Individual Time Calculation (min): 60 min   Short Term Goals: Week 3:  PT Short Term Goal 1 (Week 3): = LTG  Skilled Therapeutic Interventions/Progress Updates:   Pt reporting feeling tired but a little better today overall. Functional bed mobility for rolling and supine <> sit during session with overall supervision, verbal cues for adherence to precautions and technique. Pt able to maintain balance EOB with supervision while donning shoes using at times 1 UE support to stabilize. Performed slideboard transfers w/c <> bed with overall supervision for set-up of slideboard placement and for safety during transfer. Pt able to manage legrests without assist and correct sequencing. Pt request to do laundry and go to gift shop. W/c level able to load clothing into washing machine but requires assist with pushing buttons due to set-up of washer/dryer - pt maneuvered independently from w/c level. Off unit, engaged in community mobility w/c level down in gift shop to get items from hanging shelf and pay at cashier. Did require assist for higher items but able to problem solve that he would just ask for assistance in the community. NMR for sit <> stand re-training in parallel bars with mod assist for facilitation of weightshift, marching in place with min assist, and dynamic standing balance for reaching task with 1 UE support to R and L. Steadying assist for slideboard transfer on and off Nustep for reciprocal movement pattern retraining x 5 min on level 5. Pt request to return to bed end of session.   Therapy Documentation Precautions:  Precautions Precautions: Fall, Back Precaution Comments: spinal precautions: no bending, lifting, twisting, arching Restrictions Weight Bearing Restrictions: No Other Position/Activity Restrictions: No  brace needed per orders    Pain: Pain Assessment Pain Score: 6  Pain Type: Acute pain;Chronic pain Pain Location: Back(and headache) Pain Descriptors / Indicators: Aching;Discomfort Pain Intervention(s): Medication (See eMAR)   See Function Navigator for Current Functional Status.   Therapy/Group: Individual Therapy  Canary Brim Ivory Broad, PT, DPT  07/10/2018, 12:08 PM

## 2018-07-10 NOTE — Consult Note (Signed)
Chief Complaint: Patient was seen in consultation today for lumbar collection aspiration at the request of Dr Izora Ribas   Supervising Physician: Marybelle Killings  Patient Status: [facility] - In-pt  History of Present Illness: Trevor [name] is a 72 y.o. male   Lumbar 2-3 Discectomy surgery 06/20/18 Has had onset back pain and infected skin site since then Bacteremia; +proteus and enterobacter  Worsening back pain  New MRI:  IMPRESSION: 1. Fluid collection from subcutaneous space to the epidural space, sterility indeterminate in the setting of dehiscence. 2. Epidural enhancement at L2-3 level could be postoperative inflammation or infectious phlegmon. 3. Radiculitis with bilateral enhancing nerve roots centered at the postoperative level. 4. 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 Previously seen disc herniation has been decompressed but there is still severe spinal stenosis from posterior element hypertrophy, residual disc material, and the epidural soft tissue thickening.  Request now per Dr Izora Ribas for disc/ lumbar collection aspiration  Approved with Dr Barbie Banner   Past Medical History:  Diagnosis Date  . Acne   . Allergic rhinitis   . Anemia   . Anxiety   . BPH (benign prostatic hypertrophy) with urinary obstruction   . Cancer [facility])    bladder CA; remission since Oct 2014  . Carpal tunnel syndrome   . Cervicalgia   . Degenerative disc disease, cervical    neck and back;   . Depression    controlled;   Marland Kitchen Gastritis   . GERD (gastroesophageal reflux disease)   . Hemorrhoids   . History of malignant neoplasm of bladder   . Hyperlipidemia   . Hypertension    controlled with medication;   . Hypospadias   . Low back pain   . Mitral regurgitation   . Pre-diabetes   . Sleep apnea   . Urinary frequency     Past Surgical History:  Procedure Laterality Date  . BLADDER SURGERY     2011, 2013, 2015  . COLONOSCOPY WITH PROPOFOL N/A 07/31/2015   Procedure: COLONOSCOPY  WITH PROPOFOL;  Surgeon: Manya Silvas, MD;  Location: [facility] ENDOSCOPY;  Service: Endoscopy;  Laterality: N/A;  . COLONOSCOPY, ESOPHAGOGASTRODUODENOSCOPY (EGD) AND ESOPHAGEAL DILATION    . FLEXIBLE SIGMOIDOSCOPY    . LUMBAR LAMINECTOMY/DECOMPRESSION MICRODISCECTOMY N/A 06/20/2018   Procedure: LUMBAR LAMINECTOMY/DECOMPRESSION MICRODISCECTOMY 1 LEVEL-L-2-3;  Surgeon: Meade Maw, MD;  Location: [facility] ORS;  Service: Neurosurgery;  Laterality: N/A;  . SEPTOPLASTY    . TONSILLECTOMY      Allergies: Patient has no known allergies.  Medications: Prior to Admission medications   Medication Sig Start Date End Date Taking? Authorizing Provider  celecoxib (CELEBREX) 200 MG capsule Take 200 mg by mouth 2 (two) times daily. 06/13/18 07/13/18  [provider]  ketoconazole (NIZORAL) 2 % cream Apply 1 application topically daily. Patient not taking: Reported on 06/15/2018 04/07/18   Cuthriell, Charline Bills, PA-C  lisinopril-hydrochlorothiazide (PRINZIDE,ZESTORETIC) 10-12.5 MG tablet Take 1 tablet by mouth daily.  09/24/15   [provider]  methocarbamol (ROBAXIN) 500 MG tablet Take 1 tablet (500 mg total) by mouth every 6 (six) hours as needed for muscle spasms. 06/22/18   Marin Olp, PA-C  Multiple Vitamin (MULTIVITAMIN WITH MINERALS) TABS tablet Take 1 tablet by mouth daily.    [provider]  omeprazole (PRILOSEC OTC) 20 MG tablet Take 20 mg by mouth daily.    [provider]  OVER THE COUNTER MEDICATION Take 1.5 tablets by mouth daily as needed (constipation). [place]    [provider]  oxyCODONE (  OXY IR/ROXICODONE) 5 MG immediate release tablet Take 1 tablet (5 mg total) by mouth every 3 (three) hours as needed for moderate pain ((score 4 to 6)). 06/22/18   Marin Olp, PA-C  senna-docusate (SENOKOT-S) 8.6-50 MG tablet Take 1 tablet by mouth daily as needed for moderate constipation.  11/23/17 11/23/18  [provider]  simvastatin (ZOCOR) 40 MG  tablet Take 40 mg by mouth daily.    [provider]  sucralfate (CARAFATE) 1 G tablet Take 1.5 g by mouth daily as needed.     [provider]  tamsulosin (FLOMAX) 0.4 MG CAPS capsule TAKE 1 CAPSULE DAILY 12/12/17   McGowan, Larene Beach A, PA-C  traZODone (DESYREL) 100 MG tablet Take 100 mg by mouth at bedtime.  11/13/17   [provider]     Family History  Problem Relation Age of Onset  . Hyperlipidemia Brother   . Hyperlipidemia Sister   . Heart attack Father     Social History   Socioeconomic History  . Marital status: Single    Spouse name: Not on file  . Number of children: Not on file  . Years of education: Not on file  . Highest education level: Not on file  Occupational History  . Not on file  Social Needs  . Financial resource strain: Not on file  . Food insecurity:    Worry: Not on file    Inability: Not on file  . Transportation needs:    Medical: Not on file    Non-medical: Not on file  Tobacco Use  . Smoking status: Never Smoker  . Smokeless tobacco: Never Used  Substance and Sexual Activity  . Alcohol use: No    Alcohol/week: 0.0 oz  . Drug use: No  . Sexual activity: Not on file  Lifestyle  . Physical activity:    Days per week: Not on file    Minutes per session: Not on file  . Stress: Not on file  Relationships  . Social connections:    Talks on phone: Not on file    Gets together: Not on file    Attends religious service: Not on file    Active member of club or organization: Not on file    Attends meetings of clubs or organizations: Not on file    Relationship status: Not on file  Other Topics Concern  . Not on file  Social History Narrative  . Not on file    Review of Systems: A 12 point ROS discussed and pertinent positives are indicated in the HPI above.  All other systems are negative.  Review of Systems  Constitutional: Positive for activity change. Negative for fatigue and fever.  Respiratory: Negative for  cough and shortness of breath.   Cardiovascular: Negative for chest pain.  Gastrointestinal: Negative for abdominal pain.  Musculoskeletal: Positive for back pain and gait problem.  Neurological: Positive for weakness.  Psychiatric/Behavioral: Negative for behavioral problems and confusion.    Vital Signs: BP 98/61 (BP Location: Left Arm)   Pulse 84   Temp 99 F (37.2 C)   Resp 17   Ht 5\' 8"  (1.727 m)   Wt 182 lb 4.8 oz (82.7 kg)   SpO2 100%   BMI 27.72 kg/m   Physical Exam  Constitutional: He is oriented to person, place, and time.  Cardiovascular: Normal rate and regular rhythm.  Pulmonary/Chest: Effort normal and breath sounds normal.  Abdominal: Soft. Bowel sounds are normal.  Musculoskeletal: Normal range of motion.  Neurological: He is alert and oriented to person, place, and time.  Skin: Skin is warm and dry.  Reddened skin at surgical site   Psychiatric: He has a normal mood and affect. His behavior is normal. Judgment and thought content normal.  Nursing note and vitals reviewed.   Imaging: Dg Lumbar Spine 2-3 Views  Result Date: 06/20/2018 CLINICAL DATA:  Back surgery. EXAM: LUMBAR SPINE - 2-3 VIEW; DG C-ARM 61-120 MIN COMPARISON:  Lumbar MRI 06/13/2018 FINDINGS: Lateral C-arm images were obtained in the operating room. Lumbosacral junction not included on the study. Based on the prior MRI, there is pedicle screw fusion L3 through S1. Surgical localizer instrument is present just above the L3 pedicle screws posteriorly. IMPRESSION: Surgical localization in the operating room. Electronically Signed   By: Franchot Gallo M.D.   On: 06/20/2018 16:16   Dg Lumbar Spine Complete  Result Date: 07/07/2018 CLINICAL DATA:  Low back pain EXAM: LUMBAR SPINE - COMPLETE 4+ VIEW COMPARISON:  06/20/2018 FINDINGS: There are changes consistent with prior fusion from L3-S1. No hardware failure is seen. No compression deformity is noted. Facet hypertrophic changes are seen. No acute  abnormality noted. IMPRESSION: Postoperative and degenerative changes without acute abnormality. Electronically Signed   By: Inez Catalina M.D.   On: 07/07/2018 14:57   Mr Thoracic Spine Wo Contrast  Result Date: 06/13/2018 CLINICAL DATA:  Initial evaluation for multiple falls, recent worsening symptoms with gait abnormality, imbalance. History of bladder cancer, prior lumbar surgery in December 2018. EXAM: MRI THORACIC AND LUMBAR SPINE WITHOUT CONTRAST TECHNIQUE: Multiplanar and multiecho pulse sequences of the thoracic and lumbar spine were obtained without intravenous contrast. COMPARISON:  Previous MRI from 07/07/2017. FINDINGS: MRI THORACIC SPINE FINDINGS Alignment: Diffuse thoracic dextroscoliosis noted. Alignment otherwise within normal limits with preservation of the normal thoracic kyphosis. No listhesis. Vertebrae: Vertebral body heights maintained without evidence for acute or chronic fracture. Bone marrow signal intensity within normal limits. Few scattered benign hemangiomata noted. No worrisome osseous lesions. Chronic reactive endplate changes present about the right aspect of the T11-12 interspace due to scoliotic curvature. No abnormal marrow edema. Cord:  Signal intensity within the thoracic spinal cord is normal. Paraspinal and other soft tissues: Paraspinous soft tissues demonstrate no acute finding. Visualized lungs are grossly clear. Visualized visceral structures unremarkable. Disc levels: T1-2:  Unremarkable. T2-3: Unremarkable. T3-4:  Unremarkable. T4-5:  Mild disc bulge.  No stenosis. T5-6:  Minimal disc bulge.  No stenosis. T6-7: Tiny central disc protrusion minimally indents the ventral thecal sac. No stenosis or cord deformity. T7-8: Small central disc protrusion minimally indents the ventral thecal sac. No stenosis or cord deformity. T8-9:  Unremarkable. T9-10: Mild diffuse disc bulge. Mild posterior element hypertrophy. No significant stenosis. T10-11: Mild diffuse disc bulge.  Moderate facet hypertrophy. Resultant mild canal with bilateral foraminal narrowing, left slightly worse than right. T11-12: Diffuse disc bulge with intervertebral disc space narrowing and chronic reactive endplate changes. Mild facet hypertrophy. Resultant mild spinal stenosis. Mild right neural foraminal narrowing. T12-L1: Mild disc bulge. Right greater than left facet hypertrophy. No significant stenosis. MRI LUMBAR SPINE FINDINGS Segmentation: Normal segmentation. Lowest well-formed disc labeled the L5-S1 level. Alignment: Rotoscoliosis with mild straightening of the normal lumbar lordosis. Chronic 3 mm retrolisthesis of L5 on S1 is stable from previous. Vertebrae: Susceptibility artifact from interval posterior and interbody fusion present at L3 through S1. Vertebral body heights maintained without evidence for acute or interval fracture. Small chronic endplate Schmorl's node present at the superior endplate of L3.  Underlying bone marrow signal intensity within normal limits. No discrete or worrisome osseous lesions. No abnormal marrow edema. Conus medullaris and cauda equina: Conus extends to the L1-2 level. Conus and cauda equina appear normal. Paraspinal and other soft tissues: Normal expected postoperative changes present within the visualized visceral structures within normal limits. Disc levels: L1-2: Mild diffuse disc bulge with disc desiccation. Mild facet hypertrophy, greater on the right. No significant spinal stenosis mild right L1 foraminal narrowing, stable. L2-3: Diffuse disc bulge with disc desiccation. There is a new moderate subligamentous central disc extrusion indenting the ventral thecal sac. Slight cephalad and caudad migration of disc material. Disc extrusion measures approximately 9 x 17 x 21 mm (AP by transverse by craniocaudad). Superimposed moderate facet and ligament flavum hypertrophy. There is resultant severe spinal stenosis with impingement upon the nerve roots of the cauda  equina. Thecal sac nearly obliterated at this level, measuring approximately 2-3 mm in AP diameter at its most narrow point. Mild left L1 foraminal stenosis, stable. No significant right foraminal encroachment. L3-4: Sequelae of interval discectomy with posterior and interbody fusion with posterior decompression. Improved spinal stenosis, now mild to moderate in nature. Moderate left greater than right lateral recess narrowing also improved. No neural foraminal encroachment. L4-5: Sequelae of interval discectomy with interbody and posterior fusion, with posterior decompression. Improved thecal sac patency, with residual mild stenosis now seen. Persistent moderate right lateral recess narrowing, also improved. Foramina are patent. L5-S1: Sequelae of interval discectomy with posterior and interbody fusion with posterior decompression. No significant residual spinal stenosis. Moderate bilateral L5 foraminal narrowing due to residual reactive endplate changes and facet hypertrophy, slightly improved relative to previous exam. IMPRESSION: MR THORACIC SPINE IMPRESSION 1. No acute abnormality within the thoracic spine. 2. Multilevel degenerative spondylolysis with disc bulging and small disc protrusions. Resultant mild canal and bilateral foraminal stenosis at the T10-11 and T11-12 levels as above. 3. Multilevel facet hypertrophy within the lower thoracic spine. MR LUMBAR SPINE IMPRESSION 1. Postoperative changes from interval posterior and interbody fusion with posterior decompression at L3 through S1 with improved thecal sac patency as above. No adverse features. 2. 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 3. Residual moderate bilateral L5 foraminal stenosis related to reactive endplate changes and facet hypertrophy. Results were called by telephone at the time of interpretation on 06/13/2018 at  6:42 pm to Dr. Meade Maw , who verbally acknowledged these results. Electronically Signed   By: Jeannine Boga M.D.   On: 06/13/2018 18:46   Mr Lumbar Spine Wo Contrast  Result Date: 06/13/2018 CLINICAL DATA:  Initial evaluation for multiple falls, recent worsening symptoms with gait abnormality, imbalance. History of bladder cancer, prior lumbar surgery in December 2018. EXAM: MRI THORACIC AND LUMBAR SPINE WITHOUT CONTRAST TECHNIQUE: Multiplanar and multiecho pulse sequences of the thoracic and lumbar spine were obtained without intravenous contrast. COMPARISON:  Previous MRI from 07/07/2017. FINDINGS: MRI THORACIC SPINE FINDINGS Alignment: Diffuse thoracic dextroscoliosis noted. Alignment otherwise within normal limits with preservation of the normal thoracic kyphosis. No listhesis. Vertebrae: Vertebral body heights maintained without evidence for acute or chronic fracture. Bone marrow signal intensity within normal limits. Few scattered benign hemangiomata noted. No worrisome osseous lesions. Chronic reactive endplate changes present about the right aspect of the T11-12 interspace due to scoliotic curvature. No abnormal marrow edema. Cord:  Signal intensity within the thoracic spinal cord is normal. Paraspinal and other  soft tissues: Paraspinous soft tissues demonstrate no acute finding. Visualized lungs are grossly clear. Visualized visceral structures unremarkable. Disc levels: T1-2:  Unremarkable. T2-3: Unremarkable. T3-4:  Unremarkable. T4-5:  Mild disc bulge.  No stenosis. T5-6:  Minimal disc bulge.  No stenosis. T6-7: Tiny central disc protrusion minimally indents the ventral thecal sac. No stenosis or cord deformity. T7-8: Small central disc protrusion minimally indents the ventral thecal sac. No stenosis or cord deformity. T8-9:  Unremarkable. T9-10: Mild diffuse disc bulge. Mild posterior element hypertrophy. No significant stenosis. T10-11: Mild diffuse disc bulge. Moderate facet  hypertrophy. Resultant mild canal with bilateral foraminal narrowing, left slightly worse than right. T11-12: Diffuse disc bulge with intervertebral disc space narrowing and chronic reactive endplate changes. Mild facet hypertrophy. Resultant mild spinal stenosis. Mild right neural foraminal narrowing. T12-L1: Mild disc bulge. Right greater than left facet hypertrophy. No significant stenosis. MRI LUMBAR SPINE FINDINGS Segmentation: Normal segmentation. Lowest well-formed disc labeled the L5-S1 level. Alignment: Rotoscoliosis with mild straightening of the normal lumbar lordosis. Chronic 3 mm retrolisthesis of L5 on S1 is stable from previous. Vertebrae: Susceptibility artifact from interval posterior and interbody fusion present at L3 through S1. Vertebral body heights maintained without evidence for acute or interval fracture. Small chronic endplate Schmorl's node present at the superior endplate of L3. Underlying bone marrow signal intensity within normal limits. No discrete or worrisome osseous lesions. No abnormal marrow edema. Conus medullaris and cauda equina: Conus extends to the L1-2 level. Conus and cauda equina appear normal. Paraspinal and other soft tissues: Normal expected postoperative changes present within the visualized visceral structures within normal limits. Disc levels: L1-2: Mild diffuse disc bulge with disc desiccation. Mild facet hypertrophy, greater on the right. No significant spinal stenosis mild right L1 foraminal narrowing, stable. L2-3: Diffuse disc bulge with disc desiccation. There is a new moderate subligamentous central disc extrusion indenting the ventral thecal sac. Slight cephalad and caudad migration of disc material. Disc extrusion measures approximately 9 x 17 x 21 mm (AP by transverse by craniocaudad). Superimposed moderate facet and ligament flavum hypertrophy. There is resultant severe spinal stenosis with impingement upon the nerve roots of the cauda equina. Thecal sac  nearly obliterated at this level, measuring approximately 2-3 mm in AP diameter at its most narrow point. Mild left L1 foraminal stenosis, stable. No significant right foraminal encroachment. L3-4: Sequelae of interval discectomy with posterior and interbody fusion with posterior decompression. Improved spinal stenosis, now mild to moderate in nature. Moderate left greater than right lateral recess narrowing also improved. No neural foraminal encroachment. L4-5: Sequelae of interval discectomy with interbody and posterior fusion, with posterior decompression. Improved thecal sac patency, with residual mild stenosis now seen. Persistent moderate right lateral recess narrowing, also improved. Foramina are patent. L5-S1: Sequelae of interval discectomy with posterior and interbody fusion with posterior decompression. No significant residual spinal stenosis. Moderate bilateral L5 foraminal narrowing due to residual reactive endplate changes and facet hypertrophy, slightly improved relative to previous exam. IMPRESSION: MR THORACIC SPINE IMPRESSION 1. No acute abnormality within the thoracic spine. 2. Multilevel degenerative spondylolysis with disc bulging and small disc protrusions. Resultant mild canal and bilateral foraminal stenosis at the T10-11 and T11-12 levels as above. 3. Multilevel facet hypertrophy within the lower thoracic spine. MR LUMBAR SPINE IMPRESSION 1. Postoperative changes from interval posterior and interbody fusion with posterior decompression at L3 through S1 with improved thecal sac patency as above. No adverse features. 2. Adjacent segment disease with new moderate central disc extrusion at L2-3  with resultant severe spinal stenosis and impingement upon the nerve roots of the cauda equina. 3. Residual moderate bilateral L5 foraminal stenosis related to reactive endplate changes and facet hypertrophy. Results were called by telephone at the time of interpretation on 06/13/2018 at 6:42 pm to Dr.  Meade Maw , who verbally acknowledged these results. Electronically Signed   By: Jeannine Boga M.D.   On: 06/13/2018 18:46   Mr Lumbar Spine W Wo Contrast  Result Date: 07/10/2018 CLINICAL DATA:  Bacteremia with severe lower back pain EXAM: MRI LUMBAR SPINE WITHOUT AND WITH CONTRAST TECHNIQUE: Multiplanar and multiecho pulse sequences of the lumbar spine were obtained without and with intravenous contrast. CONTRAST:  58mL MULTIHANCE GADOBENATE DIMEGLUMINE 529 MG/ML IV SOLN COMPARISON:  06/13/2018 FINDINGS: Segmentation:  5 lumbar type vertebral bodies Alignment: These mild scoliotic curvature better seen by radiography Vertebrae: Recent laminotomy at L2-3 for decompression. There is a rim enhancing fluid collection within the laminectomy defect, extending through the intrinsic back muscles to the subcutaneous space. The dominant deep collection measures 1 x 4 cm on axial slices. The extradural space is enhancing and thickened at L2-3 and there is radicular nerve root enhancement bilaterally extending cranially and caudally. The spinal canal is congenitally narrow and posterior element hypertrophy with small residual disc protrusion continues to cause severe spinal stenosis. Conus medullaris and cauda equina: Conus extends to the L1-2 level. Paraspinal and other soft tissues: As noted above Disc levels: T12- L1: Spondylosis.  Facet spurring.  No impingement L1-L2: Spondylosis and disc bulging with facet spurring. No impingement L2-L3: Described above L3-L4: PLIF. Residual moderate spinal stenosis from short pedicles, posterior element hypertrophy, and mild residual ridging. The foramina appear patent L4-L5: PLIF. Bilateral subarticular recess narrowing and mild-to-moderate canal stenosis. The foramina are patent L5-S1:PLIF. The right L5 nerve root is difficult to appreciate separate from fibrosis and residual spurring. The canal is patent. IMPRESSION: 1. Fluid collection from subcutaneous space to  the epidural space, sterility indeterminate in the setting of dehiscence. 2. Epidural enhancement at L2-3 level could be postoperative inflammation or infectious phlegmon. 3. Radiculitis with bilateral enhancing nerve roots centered at the postoperative level. 4. 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 Previously seen disc herniation has been decompressed but there is still severe spinal stenosis from posterior element hypertrophy, residual disc material, and the epidural soft tissue thickening. Electronically Signed   By: Monte Fantasia M.D.   On: 07/10/2018 09:47   Dg C-arm 1-60 Min  Result Date: 06/20/2018 CLINICAL DATA:  Back surgery. EXAM: LUMBAR SPINE - 2-3 VIEW; DG C-ARM 61-120 MIN COMPARISON:  Lumbar MRI 06/13/2018 FINDINGS: Lateral C-arm images were obtained in the operating room. Lumbosacral junction not included on the study. Based on the prior MRI, there is pedicle screw fusion L3 through S1. Surgical localizer instrument is present just above the L3 pedicle screws posteriorly. IMPRESSION: Surgical localization in the operating room. Electronically Signed   By: Franchot Gallo M.D.   On: 06/20/2018 16:16    Labs:  CBC: Recent Labs    07/05/18 0501 07/06/18 0604 07/09/18 0548 07/10/18 0546  WBC 12.4* 11.2* 7.5 7.2  HGB 12.4* 12.1* 11.5* 11.8*  HCT 38.2* 36.8* 35.6* 36.0*  PLT 126* 121* 76* 85*    COAGS: Recent Labs    06/18/18 1415  INR 1.11  APTT 29    BMP: Recent Labs    07/02/18 0636 07/06/18 0604 07/09/18 0548 07/10/18 0546  NA 135 132* 133* 134*  K 4.4 4.6 5.1 4.8  CL  97* 96* 95* 98  CO2 29 27 29 30   GLUCOSE 123* 120* 109* 99  BUN 29* 25* 17 18  CALCIUM 9.0 8.7* 8.7* 8.8*  CREATININE 0.89 0.78 0.87 0.75  GFRNONAA >60 >60 >60 >60  GFRAA >60 >60 >60 >60    LIVER FUNCTION TESTS: Recent Labs    12/07/17 0840 06/23/18 0526  BILITOT 0.8 0.6  AST 29 26  ALT 23 23  ALKPHOS 156* 61  PROT 7.4 6.3*  ALBUMIN 3.8 3.3*    TUMOR MARKERS: No results for  input(s): AFPTM, CEA, CA199, CHROMGRNA in the last 8760 hours.  Assessment and Plan:  L 2-3 discectomy 06/20/18 Pain; infection Bacteremia MRI showing fluid collection Scheduled for lumbar collection aspiration Risks and benefits discussed with the patient including, but not limited to bleeding, infection, damage to adjacent structures or low yield requiring additional tests.  All of the patient's questions were answered, patient is agreeable to proceed. Consent signed and in chart.   Thank you for this interesting consult.  I greatly enjoyed meeting LANGLEY INGALLS and look forward to participating in their care.  A copy of this report was sent to the requesting provider on this date.  Electronically Signed: Lavonia Drafts, PA-C 07/10/2018, 3:35 PM   I spent a total of 40 Minutes    in face to face in clinical consultation, greater than 50% of which was counseling/coordinating care for lumbar collection aspiration

## 2018-07-10 NOTE — Consult Note (Signed)
[place] Nurse wound consult note Reason for Consult:Healing back incision with new onset drainage in large amount.  MRI this am identifies fluid collection. Wound type:Surgical Pressure Injury POA: NA Measurement: 2cm x 0.4cm x 3cm with yellow fibrinous slough at skin level Wound bed:As described above Drainage (amount, consistency, odor) light brown/tan exudate with musty odor Periwound:intact with induration to 2cm circumferentially. No erythema, warmth. Dressing procedure/placement/frequency: Recommend twice daily use of an antimicrobial gauze packing strip (iodoform) to act as a "wick". Changed twice daily and as needed for drainage strike through onto exterior of dressing. Guidance provided for Nursing staff to leave 2-inches of wick protruding from wound at skin level to promote wicking and for ease in retrieval. Discontinue when wound granulates to skin level, when wound contracts and does not allow for placing wick easily or when drainage diminishes.  Reduce length of wick accordingly.  [facility] nursing team will not follow, but will remain available to this patient, the nursing and medical teams.  Please re-consult if needed. Thanks, Maudie Flakes, MSN, RN, [place], Arther Abbott  Pager# [phone]

## 2018-07-10 NOTE — Progress Notes (Signed)
[facility] PHYSICAL MEDICINE & REHABILITATION     PROGRESS NOTE    Subjective/Complaints: No new problems over night per RN.   ROS: Patient denies fever, rash, sore throat, blurred vision, nausea, vomiting, diarrhea, cough, shortness of breath or chest pain, joint or back pain, headache, or mood change.    Objective:  No results found. Recent Labs    07/09/18 0548 07/10/18 0546  WBC 7.5 7.2  HGB 11.5* 11.8*  HCT 35.6* 36.0*  PLT 76* 85*   Recent Labs    07/09/18 0548 07/10/18 0546  NA 133* 134*  K 5.1 4.8  CL 95* 98  GLUCOSE 109* 99  BUN 17 18  CREATININE 0.87 0.75  CALCIUM 8.7* 8.8*   CBG (last 3)  No results for input(s): GLUCAP in the last 72 hours.  Wt Readings from Last 3 Encounters:  06/22/18 82.7 kg (182 lb 4.8 oz)  06/22/18 81.6 kg (180 lb)  06/18/18 81.6 kg (180 lb)     Intake/Output Summary (Last 24 hours) at 07/10/2018 0850 Last data filed at 07/09/2018 1753 Gross per 24 hour  Intake 720 ml  Output -  Net 720 ml    Vital Signs: Blood pressure 127/68, pulse 60, temperature 97.8 F (36.6 C), temperature source Oral, resp. rate 18, height 5\' 8"  (1.727 m), weight 82.7 kg (182 lb 4.8 oz), SpO2 100 %. Physical Exam:  Constitutional: No distress . Vital signs reviewed. HEENT: EOMI, oral membranes moist Neck: supple Cardiovascular: RRR without murmur. No JVD    Respiratory: CTA Bilaterally without wheezes or rales. Normal effort    GI: BS +, non-tender, non-distended  Musc: minimal tenderness around surgical site. Neurological:alert Speech clear.  Able to follow basic commands without difficulty. Motor:  B/l LE: Hip flexion 2/5, knee extension 3+/5, ankle dorsiflexion 4 -/5 (right stronger than left, stable Sensation intact light touch Skin:surgical incision with serous/orange tinted drainage  Psych: Pleasant and appropriate  Assessment/Plan: 1. 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 Physiatrist is providing close team supervision and 24 hour management of active medical problems listed below. Physiatrist and rehab team continue to assess barriers to discharge/monitor patient progress toward functional and medical goals.  Function:  Bathing Bathing position   Position: Sitting EOB  Bathing parts Body parts bathed by patient: Left arm, Right arm, Abdomen, Chest, Front perineal area, Buttocks, Right upper leg, Left upper leg, Right lower leg, Left lower leg, Back Body parts bathed by helper: Back, Buttocks  Bathing assist Assist Level: Supervision or verbal cues Assistive Device Comment: LH sponge    Upper Body Dressing/Undressing Upper body dressing   What is the patient wearing?: Pull over shirt/dress     Pull over shirt/dress - Perfomed by patient: Thread/unthread right sleeve, Thread/unthread left sleeve, Put head through opening, Pull shirt over trunk          Upper body assist Assist Level: More than reasonable time   Set up : To obtain clothing/put away  Lower Body Dressing/Undressing Lower body dressing   What is the patient wearing?: Pants, Non-skid slipper socks     Pants- Performed by patient: Thread/unthread right pants leg, Thread/unthread left pants leg, Pull pants up/down Pants- Performed by helper: Pull pants up/down Non-skid slipper socks- Performed by patient: Don/doff left sock, Don/doff right sock Non-skid slipper socks- Performed by helper: Don/doff right sock, Don/doff left sock     Shoes - Performed by  patient: Don/doff right shoe, Don/doff left shoe, Fasten right, Fasten left       TED Hose - Performed by patient: Don/doff right TED hose, Don/doff left TED hose TED Hose - Performed by helper: Don/doff left TED hose, Don/doff right TED hose  Lower body assist Assist for lower body dressing: Supervision or verbal cues, Set up   Set up : To obtain clothing/put  away  Toileting Toileting Toileting activity did not occur: No continent bowel/bladder event Toileting steps completed by patient: Adjust clothing prior to toileting, Performs perineal hygiene, Adjust clothing after toileting Toileting steps completed by helper: Adjust clothing after toileting Toileting Assistive Devices: Grab bar or rail  Toileting assist Assist level: Supervision or verbal cues   Transfers Chair/bed transfer   Chair/bed transfer method: Lateral scoot Chair/bed transfer assist level: Supervision or verbal cues Chair/bed transfer assistive device: Armrests, Sliding board Mechanical lift: Stedy   Locomotion Ambulation Ambulation activity did not occur: Safety/medical concerns   Max distance: 10' Assist level: 2 helpers(min/mod of 1 with +2 for close w/c follow)   Wheelchair   Type: Manual Max wheelchair distance: 150 Assist Level: No help, No cues, assistive device, takes more than reasonable amount of time  Cognition Comprehension Comprehension assist level: Follows complex conversation/direction with no assist  Expression Expression assist level: Expresses complex ideas: With no assist  Social Interaction Social Interaction assist level: Interacts appropriately with others - No medications needed.  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   Holding on SNF transfer given wound/ID issues 2. DVT Prophylaxis/Anticoagulation: Mechanical:Sequential compression devices, below kneeBilateral lower extremities 3. Pain Management:Celebrex d/ced   Decadron tapered to off.  Kpad for back ordered 4. Mood:team to provide ego support. LCSW to follow for evaluation and support.  -sl increase trazodone for sleep disturbance  -encouraged him to use later if needed. 5.  Neuropsych: This patientiscapable of making decisions on hisown behalf. 6.  Wound Care/ID:Back/surgical wound dehiscence over weekend.   -blood cx + for proteus, wound cx pending, Ucx with multispecies, Blood cx ID panel indicated enterobacter also  -wbc's normal, platelets holding at 85k  -MRI of lumbar spine just performed  -continue doxycycline and ceftriaxone, appreciate ID help  -surgery aware  -afebrile at present but low grade temps over weekend 7. Fluids/Electrolytes/Nutrition:  -continue to push fluids.   -potassium 4.6 on 7/26  -celebrex d/ced 8. Urinary retention/BPH/neurogenic bladder:H/o hypospadias.  -Retention has resolved 9. HTN: Monitor BP bid--  and lisinopril. HCTZ stopped Vitals:   07/09/18 1934 07/10/18 0456  BP: (!) 108/58 127/68  Pulse: 76 60  Resp: 16 18  Temp: 98.4 F (36.9 C) 97.8 F (36.6 C)  SpO2: 98% 100%   Controlled on 7/30 10. Steroid induced hypoglycemia on Prediabetes: CM diet. 11. GERD:  continue PPI due to h/o of gastritis   13. Neurogenic bowel:   Bowel regimen increased on 7/26 14. Acute blood loss anemia  Hemoglobin 11.8 today  Cont to monitor 15. Thrombocytopenia  85k today,     LOS (Days) [place] EVALUATION WAS PERFORMED  Meredith Staggers, MD 07/10/2018 8:50 AM

## 2018-07-10 NOTE — Progress Notes (Signed)
Results of MRI relayed to [name]. He recommends having IR drain as much of fluid collection as possible as well as antibiotic therapy. WOC recommendations relayed and he agrees with packing wound as per their recommendations.   Discussed Surgeon's recommendations with Dr. Linus Salmons who feels that patient needs I & D.

## 2018-07-10 NOTE — Progress Notes (Signed)
Occupational Therapy Note  Patient Details  Name: Trevor [name] MRN: [id number] Date of Birth: 05-14-1946  Today's Date: 07/10/2018 OT Missed Time: 75 Minutes Missed Time Reason: CT/MRI/pain  Pt off the floor to MRI during OT session. OT unable to return per schedule. Will follow up per plan of care.    Daneen Schick Tatyana Biber 07/10/2018, 3:37 PM

## 2018-07-10 NOTE — Progress Notes (Signed)
Occupational Therapy Session Note  Patient Details  Name: Trevor [name] MRN: [id number] Date of Birth: 1946-08-01  Today's Date: 07/10/2018 OT Individual Time: 8115-7262 OT Individual Time Calculation (min): 30 min    Short Term Goals: Week 2:  OT Short Term Goal 1 (Week 2): Pt will complete toilet/BSC transfer with no more than Mod A OT Short Term Goal 2 (Week 2): Pt will utilize leaning method in shower to wash buttocks OT Short Term Goal 3 (Week 2): Pt will complete 2 grooming tasks in supported standing  OT Short Term Goal 4 (Week 2): Pt will follow back precautions within BADL tasks with no more than min questioning cues  Skilled Therapeutic Interventions/Progress Updates:    Pt worked on sit to stand transitions and standing balance at the EOB during session.  Mod assist for sit to stand with use of the [facility] for UE support.  He completed several intervals with standing endurance lasting approximately 1 min each.  Decreased lumbar and hip extension noted in standing secondary to lower back pain.  Pt positioned back in bed a completion of intervals per his request as he stated he was not feeling great today.  Finished session with call button and phone in reach with bed alarm in place.  Therapy Documentation Precautions:  Precautions Precautions: Fall, Back Precaution Comments: spinal precautions: no bending, lifting, twisting, arching Restrictions Weight Bearing Restrictions: No Other Position/Activity Restrictions: No brace needed per orders   Pain: Pain Assessment Pain Scale: Faces Pain Score: 4  Faces Pain Scale: Hurts a little bit Pain Type: Surgical pain Pain Location: Back Pain Orientation: Lower Pain Descriptors / Indicators: Discomfort Pain Onset: With Activity Pain Intervention(s): Repositioned ADL: See Function Navigator for Current Functional Status.   Therapy/Group: Individual Therapy  Alaysiah Browder OTR/L 07/10/2018, 12:42 PM

## 2018-07-10 NOTE — Progress Notes (Signed)
Recreational Therapy Discharge Summary Patient Details  Name: Trevor [name] MRN: [id number] Date of Birth: [birth year] Today's Date: 07/10/2018   Reasons goals not met: TR Was following pt for participation in community reintegration with plans to be scheduled on an outing this week.  Pt with change in medical status and will not be participating in an outing as originally thought. Pt is being discharged from [facility]. Reasons for discharge: change in medical status  Patient/family agrees with progress made and goals achieved: Yes  [name] 07/10/2018, 3:47 PM

## 2018-07-10 NOTE — Progress Notes (Signed)
Physical Therapy Session Note  Patient Details  Name: MILFORD CILENTO MRN: [id number] Date of Birth: 01-16-1946  Today's Date: 07/10/2018 PT Individual Time: 1335-1415 PT Individual Time Calculation (min): 40 min   Short Term Goals: Week 3:  PT Short Term Goal 1 (Week 3): = LTG  Skilled Therapeutic Interventions/Progress Updates: Pt received supine in bed, c/o pain in back but does not rate, "twinge" in back with specific movements. Supine>sit with S. Lateral scoot with transfer board bed>w/c with S and cues for setup to recall moving close arm rest. W/c propulsion to/from gym modI with BUE. Standing pre-gait at hall rail with LUE around therapist's shoulder; performed x10 forward/backward steps BLE, one episode of buckling requiring modA to recover. Gait x30' with hall rails and minA overall, one buckle during gait trial; note shortened step length with locked LEs throughout, minimal knee flexion during swing likely d/t fear of buckling. Returned to bed with S slideboard transfer. Supine PNF D2 flexion/extension rhythmic initiation PROM>AROM>resisted AROM (RLE only resisted); performed sets of 2-5 reps at a time before requiring rest break. Positioned in sidelying, propped with pillows for comfort and skin protection; alarm intact and all needs in reach.      Therapy Documentation Precautions:  Precautions Precautions: Fall, Back Precaution Comments: spinal precautions: no bending, lifting, twisting, arching Restrictions Weight Bearing Restrictions: No Other Position/Activity Restrictions: No brace needed per orders   See Function Navigator for Current Functional Status.   Therapy/Group: Individual Therapy  Corliss Skains 07/10/2018, 2:35 PM

## 2018-07-10 NOTE — Progress Notes (Addendum)
Physical Therapy Session Note  Patient Details  Name: Trevor [name] MRN: [id number] Date of Birth: [birth year]  Today's Date: 07/10/2018 PT Individual Time: 1500-1523 PT Individual Time Calculation (min): 23 min   Skilled Therapeutic Interventions/Progress Updates:  Pt received in bed & agreeable to tx. Pt reports 2/10 back pain but states he's premedicated. Pt performed the following BLE strengthening exercises AROM/AAROM: ankle pumps, short arc quads, hip adduction pillow squeezes, hip abduction, and heel slides. Therapist provided cuing for proper technique. Pt left in bed with all needs within reach & alarm set.  Pt was able to independently scoot himself up in bed. Pt rolled to L sidelying without assistance and maintaining back precautions.  Addendum: Pt with increased pain when performing exercises with LLE vs RLE.  Therapy Documentation Precautions:  Precautions Precautions: Fall, Back Precaution Comments: spinal precautions: no bending, lifting, twisting, arching Restrictions Weight Bearing Restrictions: No Other Position/Activity Restrictions: No brace needed per orders   See Function Navigator for Current Functional Status.   Therapy/Group: Individual Therapy  Waunita Schooner 07/10/2018, 3:30 PM

## 2018-07-11 ENCOUNTER — Inpatient Hospital Stay (HOSPITAL_COMMUNITY): Payer: Medicare Other | Admitting: Occupational Therapy

## 2018-07-11 ENCOUNTER — Inpatient Hospital Stay (HOSPITAL_COMMUNITY): Payer: Medicare Other

## 2018-07-11 LAB — AEROBIC CULTURE  (SUPERFICIAL SPECIMEN)
GRAM STAIN: NONE SEEN
SPECIAL REQUESTS: NORMAL

## 2018-07-11 LAB — AEROBIC CULTURE W GRAM STAIN (SUPERFICIAL SPECIMEN)

## 2018-07-11 LAB — PROTIME-INR
INR: 1.24
Prothrombin Time: 15.5 seconds — ABNORMAL HIGH (ref 11.4–15.2)

## 2018-07-11 MED ORDER — LIDOCAINE HCL 1 % IJ SOLN
INTRAMUSCULAR | Status: AC
  Filled 2018-07-11: qty 20

## 2018-07-11 MED ORDER — FENTANYL CITRATE (PF) 100 MCG/2ML IJ SOLN
INTRAMUSCULAR | Status: AC
  Filled 2018-07-11: qty 2

## 2018-07-11 MED ORDER — MIDAZOLAM HCL 2 MG/2ML IJ SOLN
INTRAMUSCULAR | Status: AC
Start: 2018-07-11 — End: 2018-07-11
  Filled 2018-07-11: qty 4

## 2018-07-11 MED ORDER — FENTANYL CITRATE (PF) 100 MCG/2ML IJ SOLN
INTRAMUSCULAR | Status: AC | PRN
  Administered 2018-07-11: 50 ug via INTRAVENOUS

## 2018-07-11 MED ORDER — MIDAZOLAM HCL 2 MG/2ML IJ SOLN
INTRAMUSCULAR | Status: AC | PRN
  Administered 2018-07-11 (×2): 1 mg via INTRAVENOUS

## 2018-07-11 MED ORDER — PREDNISONE 20 MG PO TABS
20.0000 mg | ORAL_TABLET | Freq: Once | ORAL | Status: AC
  Administered 2018-07-11: 20 mg via ORAL
  Filled 2018-07-11: qty 1

## 2018-07-11 NOTE — Sedation Documentation (Signed)
Patient is resting comfortably. 

## 2018-07-11 NOTE — Progress Notes (Signed)
Physical Therapy Session Note  Patient Details  Name: Trevor [name] MRN: [id number] Date of Birth: Sep 11, 1946  Today's Date: 07/11/2018 PT Individual Time: 0900-0930 PT Individual Time Calculation (min): 30 min  Missed time: 45 min   Short Term Goals: Week 3:  PT Short Term Goal 1 (Week 3): = LTG  Skilled Therapeutic Interventions/Progress Updates:    Session 1: Pt supine in bed upon PT arrival, agreeable to therapy tx and reports 8/10 pain in face from break out. Pt transferred to sitting EOB with supervision using bedrails. Pt performed slideboard transfer from bed>w/c with supervision and verbal cues for safety. Pt propelled w/c to the gym x 150 ft with B UEs and supervision. Pt performed x 3 sit<>stands within parallel bars with min assist, in standing worked on pre-gait stepping forward/back in place with each LE x 10 and performed 2 x 10 mini squats with heavy reliance on UEs. Pt transferred from w/c<>mat lateral scoot without board, min assist. Pt performed x 2 sit<>stands without AD from elevated mat, mod assist working on LE strengthening and weightbearing. Pt transferred back to w/c and propelled back to room, left with needs in reach and chair alarm set.   Session 2: Pt supine in bed upon PT arrival. Pt had procedure earlier today, s/p wound wash out. Pt declines to participate in PT at this time 2/2 pain and fatigue after procedure.  Pt missed 45 minutes of skilled therapy tx secondary to pain/fatigue.      Therapy Documentation Precautions:  Precautions Precautions: Fall, Back Precaution Comments: spinal precautions: no bending, lifting, twisting, arching Restrictions Weight Bearing Restrictions: No Other Position/Activity Restrictions: No brace needed per orders   See Function Navigator for Current Functional Status.   Therapy/Group: Individual Therapy  Netta Corrigan, PT, DPT 07/11/2018, 7:55 AM

## 2018-07-11 NOTE — Progress Notes (Addendum)
Occupational Therapy Note  Patient Details  Name: Trevor [name] MRN: [id number] Date of Birth: Jun 22, 1946  45 missed minutes of OT intervention  Pt currently off floor for procedure. OT will attempt again at next available time.    Darleen Crocker P 07/11/2018, 11:20 AM

## 2018-07-11 NOTE — Procedures (Signed)
Interventional Radiology Procedure Note  Procedure: CT guided aspiration of lumbar fluid collection   Complications: None  Estimated Blood Loss: None  Findings: 18 G spinal needle advanced into draining wound at L2/3 posteriorly.  Able to aspirate 1 mL of blood-tinged fluid.  Sent for culture analysis.  Venetia Night. Kathlene Cote, M.D Pager:  [phone]

## 2018-07-11 NOTE — Progress Notes (Signed)
Occupational Therapy Weekly Progress Note  Patient Details  Name: Trevor [name] MRN: [id number] Date of Birth: December 04, 1946  Beginning of progress report period: June 23, 2018 End of progress report period: July 11, 2018  Today's Date: 07/11/2018 Session 1 OT Individual Time: 0930-1000 OT Individual Time Calculation (min): 30 min  and Today's Date: 07/11/2018  Session 2 OT Missed Time: 75 Minutes Missed Time Reason: Patient fatigue;Pain   Pt had been making steady progress towards OT goals and was prepared for dc to SNF on Monday 7/29. Pt with change in medical status with onset of infection and reaction to medications. Pt has missed multiple therapy sessions and has had some decline in strength within BADL tasks. Pt remains at a min A level for most BADL tasks. Will continue to work with pt to progress independence until medically stable to dc to SNF.  Patient continues to demonstrate the following deficits: muscle weakness and decreased sitting balance, decreased standing balance, decreased postural control, decreased balance strategies and difficulty maintaining precautions and therefore will continue to benefit from skilled OT intervention to enhance overall performance with BADL.  Patient progressing toward long term goals..  Continue plan of care.  OT Short Term Goals Week 2:  OT Short Term Goal 1 (Week 2): Pt will complete toilet/BSC transfer with no more than Mod A OT Short Term Goal 1 - Progress (Week 2): Met OT Short Term Goal 2 (Week 2): Pt will utilize leaning method in shower to wash buttocks OT Short Term Goal 2 - Progress (Week 2): Met OT Short Term Goal 3 (Week 2): Pt will complete 2 grooming tasks in supported standing  OT Short Term Goal 3 - Progress (Week 2): Met OT Short Term Goal 4 (Week 2): Pt will follow back precautions within BADL tasks with no more than min questioning cues OT Short Term Goal 4 - Progress (Week 2): Met Week 3:  OT Short Term Goal 1 (Week 3): LTG  = STG 2/2 ELOS  Skilled Therapeutic Interventions/Progress Updates:  Session 1   Pt greeted seated on commode and agreeable to OT treatment session. Pt stood with min A commode, then needed assist to manage clothing. Pt reported feeling weaker today as he is NPO for procedure. Pt also reported feeling "swimmy headed" and requested to lay back down for a minute. Stedy used to transfer pt back to bed with min A. After rest break, pt came back to sitting EOB and bathing/dressing completed at EOB and in bed. Sit<>stand in [place] with min A and assistance to wash buttocks. Pt needed min A to thread R LE into pants today 2/2 fatigue, then returned to supine to roll and pull pants up over hips. Pt left semi-reclined in bed at end of session with needs met.   Session 2 Pt recently returned to the floor s/p wound wash out procedure. Pt declines to participate in OT at this time 2/2 pain and fatigue after procedure. OT to follow up per plan of care.   Therapy Documentation Precautions:  Precautions Precautions: Fall, Back Precaution Comments: spinal precautions: no bending, lifting, twisting, arching Restrictions Weight Bearing Restrictions: No Other Position/Activity Restrictions: No brace needed per orders General: General OT Amount of Missed Time: 75 Minutes Pain: Pain Assessment Pain Scale: 0-10 Pain Score: 0-No pain ADL: ADL ADL Comments: Please see functional navigator for ADL status  See Function Navigator for Current Functional Status.   Therapy/Group: Individual Therapy  Valma Cava 07/11/2018, 1:26 PM

## 2018-07-11 NOTE — Patient Care Conference (Signed)
Inpatient RehabilitationTeam Conference and Plan of Care Update Date: 07/10/2018   Time: 2:30 PM    Patient Name: Trevor [name]      Medical Record Number: [id number]  Date of Birth: 03/16/1946 Sex: Male         Room/Bed: 4W11C/4W11C-01 Payor Info: Payor: [place] / Plan: [facility] MEDICARE / Product Type: *No Product type* /    Admitting Diagnosis: Lumbar Radic  Admit Date/Time:  06/22/2018  6:15 PM Admission Comments: No comment available   Primary Diagnosis:  <principal problem not specified> Principal Problem: <principal problem not specified>  Patient Active Problem List   Diagnosis Date Noted  . Rupture of operation wound   . Pain   . Low grade fever   . Postoperative pain   . Neurogenic bowel   . Thrombocytopenia ([place])   . Acute blood loss anemia   . Sleep disturbance   . Essential hypertension   . Steroid-induced hyperglycemia   . Prediabetes   . Leukocytosis   . Herniation of lumbar intervertebral disc with radiculopathy 06/22/2018  . Lumbar radiculopathy 06/20/2018  . Nocturia 10/23/2015  . Absolute anemia 05/13/2014  . Benign essential HTN 05/13/2014  . Blood glucose elevated 05/13/2014  . LBP (low back pain) 05/13/2014  . HLD (hyperlipidemia) 05/13/2014  . Malignant neoplasm of lateral wall of urinary bladder ([place]) 09/19/2013  . Benign prostatic hypertrophy without urinary obstruction 12/10/2012  . Hypospadias 12/10/2012  . Microscopic hematuria 12/10/2012  . History of neoplasm of bladder 12/10/2012  . FOM (frequency of micturition) 12/10/2012    Expected Discharge Date: Expected Discharge Date: (SNF)  Team Members Present: Physician leading conference: Dr. Alger Simons Social Worker Present: Lennart Pall, LCSW Nurse Present: Junius Creamer, RN PT Present: Roderic Ovens, PT OT Present: Cherylynn Ridges, OT SLP Present: Weston Anna, SLP PPS Coordinator present : Daiva Nakayama, RN, CRRN     Current Status/Progress Goal Weekly Team Focus   Medical   wound dehiscence, on iv abx, proteus  stabilize medically for d/c  see chart   Bowel/Bladder      pt to remain continent of B/B with normal function  assist with toileting and admin laxatives as needed   Swallow/Nutrition/ Hydration             ADL's   d/c held 2/2 medical issues, pt has not participated in OT in 3 days, per PT, transfers are still min/supervision per PT  Supervision/min A  dc planning, modified bathing/dressing, general strengthening   Mobility   d/c held due to medical issues, pt not participating in therapies x 2-3 days  supervision overall w/c level  activity tolerance, d/c planning   Communication             Safety/Cognition/ Behavioral Observations            Pain   Pt pain managed with current plan of care, pt has Oxycodone 5-10mg  & Robain PRN  Pt pain was 4/10  Assess pt for pain Q shift and medicate PRN as directed   Skin   Pt has incision to back to open back up, dsg applied and Changed PRN  Pt will remain free from any further breakdowns; above issues will be free from infection  Assess patient q shift and PRN      *See Care Plan and progress notes for long and short-term goals.     Barriers to Discharge  Current Status/Progress Possible Resolutions Date Resolved   Physician    Medical stability  Nursing                  PT                    OT                  SLP                SW                Discharge Planning/Teaching Needs:  Plan changed to SNF as family cannot meet assistance needs.  NA   Team Discussion:  Medical issues (incisional wound) have postponed SNF d/c yesterday. Await IR to drain and decide care plan.  Still min - supervision w/c level with therapies.  Will d/c to SNF when medically cleared to do so.  Revisions to Treatment Plan:  Delay in SNF d/c due to medical    Continued Need for Acute Rehabilitation Level of Care: The patient requires daily medical management by a physician with  specialized training in physical medicine and rehabilitation for the following conditions: Daily direction of a multidisciplinary physical rehabilitation program to ensure safe treatment while eliciting the highest outcome that is of practical value to the patient.: Yes Daily medical management of patient stability for increased activity during participation in an intensive rehabilitation regime.: Yes Daily analysis of laboratory values and/or radiology reports with any subsequent need for medication adjustment of medical intervention for : Neurological problems;Post surgical problems;Wound care problems  Velmer Broadfoot 07/11/2018, 4:09 PM

## 2018-07-11 NOTE — Progress Notes (Signed)
[facility]     PROGRESS NOTE    Subjective/Complaints: Mouth and perioral area very sore, increased rash.  ROS: Patient denies fever, rash, sore throat, blurred vision, nausea, vomiting, diarrhea, cough, shortness of breath or chest pain, joint or back pain, headache, or mood change. .    Objective:  Mr Lumbar Spine W Wo Contrast  Result Date: 07/10/2018 CLINICAL DATA:  Bacteremia with severe lower back pain EXAM: MRI LUMBAR SPINE WITHOUT AND WITH CONTRAST TECHNIQUE: Multiplanar and multiecho pulse sequences of the lumbar spine were obtained without and with intravenous contrast. CONTRAST:  86mL MULTIHANCE GADOBENATE DIMEGLUMINE 529 MG/ML IV SOLN COMPARISON:  06/13/2018 FINDINGS: Segmentation:  5 lumbar type vertebral bodies Alignment: These mild scoliotic curvature better seen by radiography Vertebrae: Recent laminotomy at L2-3 for decompression. There is a rim enhancing fluid collection within the laminectomy defect, extending through the intrinsic back muscles to the subcutaneous space. The dominant deep collection measures 1 x 4 cm on axial slices. The extradural space is enhancing and thickened at L2-3 and there is radicular nerve root enhancement bilaterally extending cranially and caudally. The spinal canal is congenitally narrow and posterior element hypertrophy with small residual disc protrusion continues to cause severe spinal stenosis. Conus medullaris and cauda equina: Conus extends to the L1-2 level. Paraspinal and other soft tissues: As noted above Disc levels: T12- L1: Spondylosis.  Facet spurring.  No impingement L1-L2: Spondylosis and disc bulging with facet spurring. No impingement L2-L3: Described above L3-L4: PLIF. Residual moderate spinal stenosis from short pedicles, posterior element hypertrophy, and mild residual ridging. The foramina appear patent L4-L5: PLIF. Bilateral subarticular recess narrowing and mild-to-moderate canal stenosis. The  foramina are patent L5-S1:PLIF. The right L5 nerve root is difficult to appreciate separate from fibrosis and residual spurring. The canal is patent. IMPRESSION: 1. Fluid collection from subcutaneous space to the epidural space, sterility indeterminate in the setting of dehiscence. 2. Epidural enhancement at L2-3 level could be postoperative inflammation or infectious phlegmon. 3. Radiculitis with bilateral enhancing nerve roots centered at the postoperative level. 4. 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 Previously seen disc herniation has been decompressed but there is still severe spinal stenosis from posterior element hypertrophy, residual disc material, and the epidural soft tissue thickening. Electronically Signed   By: Monte Fantasia M.D.   On: 07/10/2018 09:47   Recent Labs    07/09/18 0548 07/10/18 0546  WBC 7.5 7.2  HGB 11.5* 11.8*  HCT 35.6* 36.0*  PLT 76* 85*   Recent Labs    07/09/18 0548 07/10/18 0546  NA 133* 134*  K 5.1 4.8  CL 95* 98  GLUCOSE 109* 99  BUN 17 18  CREATININE 0.87 0.75  CALCIUM 8.7* 8.8*   CBG (last 3)  No results for input(s): GLUCAP in the last 72 hours.  Wt Readings from Last 3 Encounters:  06/22/18 82.7 kg (182 lb 4.8 oz)  06/22/18 81.6 kg (180 lb)  06/18/18 81.6 kg (180 lb)     Intake/Output Summary (Last 24 hours) at 07/11/2018 0901 Last data filed at 07/10/2018 1747 Gross per 24 hour  Intake 600 ml  Output -  Net 600 ml    Vital Signs: Blood pressure 132/64, pulse 79, temperature 98.5 F (36.9 C), temperature source Oral, resp. rate 17, height 5\' 8"  (1.727 m), weight 82.7 kg (182 lb 4.8 oz), SpO2 97 %. Physical Exam:  Constitutional: No distress . Vital signs reviewed. HEENT: EOMI, oral membranes moist, dificulty with speech due to  rash Neck: supple Cardiovascular: RRR without murmur. No JVD    Respiratory: CTA Bilaterally without wheezes or rales. Normal effort    GI: BS +, non-tender, non-distended  Musc:   LB  TTP Neurological:alert Speech clear.  Able to follow basic commands without difficulty. Motor:  B/l LE: Hip flexion 2/5, knee extension 3+/5, ankle dorsiflexion 4 -/5 (right stronger than left, stable Sensation intact light touch Skin:surgical incision with ongoing serous/orange tinted drainage, lips blistered, macular perioral facial rash extending an inch+ from lips Psych: irritable  Assessment/Plan: 1. 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 Physiatrist is providing close team supervision and 24 hour management of active medical problems listed below. Physiatrist and rehab team continue to assess barriers to discharge/monitor patient progress toward functional and medical goals.  Function:  Bathing Bathing position   Position: Sitting EOB  Bathing parts Body parts bathed by patient: Left arm, Right arm, Abdomen, Chest, Front perineal area, Buttocks, Right upper leg, Left upper leg, Right lower leg, Left lower leg, Back Body parts bathed by helper: Back, Buttocks  Bathing assist Assist Level: Supervision or verbal cues Assistive Device Comment: LH sponge    Upper Body Dressing/Undressing Upper body dressing   What is the patient wearing?: Pull over shirt/dress     Pull over shirt/dress - Perfomed by patient: Thread/unthread right sleeve, Thread/unthread left sleeve, Put head through opening, Pull shirt over trunk          Upper body assist Assist Level: More than reasonable time   Set up : To obtain clothing/put away  Lower Body Dressing/Undressing Lower body dressing   What is the patient wearing?: Pants, Non-skid slipper socks     Pants- Performed by patient: Thread/unthread right pants leg, Thread/unthread left pants leg, Pull pants up/down Pants- Performed by helper: Pull pants up/down Non-skid slipper socks- Performed by patient: Don/doff  left sock, Don/doff right sock Non-skid slipper socks- Performed by helper: Don/doff right sock, Don/doff left sock     Shoes - Performed by patient: Don/doff right shoe, Don/doff left shoe, Fasten right, Fasten left       TED Hose - Performed by patient: Don/doff right TED hose, Don/doff left TED hose TED Hose - Performed by helper: Don/doff left TED hose, Don/doff right TED hose  Lower body assist Assist for lower body dressing: Supervision or verbal cues, Set up   Set up : To obtain clothing/put away  Toileting Toileting Toileting activity did not occur: No continent bowel/bladder event Toileting steps completed by patient: Adjust clothing prior to toileting, Performs perineal hygiene, Adjust clothing after toileting Toileting steps completed by helper: Adjust clothing after toileting Toileting Assistive Devices: Grab bar or rail  Toileting assist Assist level: Supervision or verbal cues   Transfers Chair/bed transfer   Chair/bed transfer method: Lateral scoot Chair/bed transfer assist level: Supervision or verbal cues Chair/bed transfer assistive device: Armrests, Sliding board Mechanical lift: Stedy   Locomotion Ambulation Ambulation activity did not occur: Safety/medical concerns   Max distance: 30 Assist level: 2 helpers   Wheelchair   Type: Manual Max wheelchair distance: 150 Assist Level: No help, No cues, assistive device, takes more than reasonable amount of time  Cognition Comprehension Comprehension assist level: Follows complex conversation/direction with no assist  Expression Expression assist level: Expresses complex ideas: With no assist  Social Interaction Social Interaction assist level: Interacts appropriately with others - No medications needed.  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   Holding on SNF transfer given wound/ID issues---?Friday 2. DVT Prophylaxis/Anticoagulation: Mechanical:Sequential compression devices, below kneeBilateral lower extremities 3. Pain Management:Celebrex d/ced    .  Kpad for back ordered 4. Mood:team to provide ego support. LCSW to follow for evaluation and support.  - trazodone for sleep disturbance  -encouraged him to use later if needed. 5. Neuropsych: This patientiscapable of making decisions on hisown behalf. 6.  Wound Care/ID:Back/surgical wound dehiscence over weekend.   -blood, wound cx + for proteus, Ucx with multispecies, Blood cx ID panel indicated enterobacter also  -wbc's normal, platelets holding at 85k  -MRI of lumbar spine with fluid behind op site  -  appreciate ID help--->amoxicillin only  -opsite drainage today per INR  -perioral rash from ?ctx?   -zovirax and triamcinalone cream ordered   -now on amoxil alone   -prednisone today after procedure 7. Fluids/Electrolytes/Nutrition:  -continue to push fluids.   -potassium 4.6 on 7/26  -celebrex d/ced 8. Urinary retention/BPH/neurogenic bladder:H/o hypospadias.  -Retention has resolved 9. HTN: Monitor BP bid--  and lisinopril. HCTZ stopped Vitals:   07/10/18 2042 07/11/18 0434  BP: 134/79 132/64  Pulse: 79 79  Resp: 16 17  Temp: 99.2 F (37.3 C) 98.5 F (36.9 C)  SpO2: 95% 97%   Controlled on 7/31 10. Steroid induced hypoglycemia on Prediabetes: CM diet. 11. GERD:  continue PPI due to h/o of gastritis   13. Neurogenic bowel:   Bowel regimen increased on 7/26 14. Acute blood loss anemia  Hemoglobin 11.8  y  Cont to monitor 15. Thrombocytopenia  85k   -recheck tomorrow     LOS (Days) [place] EVALUATION WAS PERFORMED  Meredith Staggers, MD 07/11/2018 9:01 AM

## 2018-07-12 ENCOUNTER — Inpatient Hospital Stay (HOSPITAL_COMMUNITY): Payer: Medicare Other | Admitting: Physical Therapy

## 2018-07-12 ENCOUNTER — Inpatient Hospital Stay (HOSPITAL_COMMUNITY): Payer: Medicare Other | Admitting: Occupational Therapy

## 2018-07-12 DIAGNOSIS — B002 Herpesviral gingivostomatitis and pharyngotonsillitis: Secondary | ICD-10-CM

## 2018-07-12 DIAGNOSIS — T8149XA Infection following a procedure, other surgical site, initial encounter: Secondary | ICD-10-CM | POA: Diagnosis present

## 2018-07-12 DIAGNOSIS — T8189XA Other complications of procedures, not elsewhere classified, initial encounter: Secondary | ICD-10-CM | POA: Insufficient documentation

## 2018-07-12 LAB — BASIC METABOLIC PANEL
Anion gap: 8 (ref 5–15)
BUN: 14 mg/dL (ref 8–23)
CALCIUM: 8.7 mg/dL — AB (ref 8.9–10.3)
CHLORIDE: 101 mmol/L (ref 98–111)
CO2: 27 mmol/L (ref 22–32)
Creatinine, Ser: 0.66 mg/dL (ref 0.61–1.24)
GFR calc Af Amer: >60 mL/min (ref >60)
GFR calc non Af Amer: >60 mL/min (ref >60)
GLUCOSE: 114 mg/dL — AB (ref 70–99)
POTASSIUM: 4.1 mmol/L (ref 3.5–5.1)
Sodium: 136 mmol/L (ref 135–145)

## 2018-07-12 LAB — CBC
HCT: 36.1 % — ABNORMAL LOW (ref 39.0–52.0)
HEMOGLOBIN: 11.8 g/dL — AB (ref 13.0–17.0)
MCH: 29.3 pg (ref 26.0–34.0)
MCHC: 32.7 g/dL (ref 30.0–36.0)
MCV: 89.6 fL (ref 78.0–100.0)
Platelets: 98 10*3/uL — ABNORMAL LOW (ref 150–400)
RBC: 4.03 MIL/uL — ABNORMAL LOW (ref 4.22–5.81)
RDW: 12.1 % (ref 11.5–15.5)
WBC: 5.9 10*3/uL (ref 4.0–10.5)

## 2018-07-12 NOTE — Progress Notes (Signed)
Social Work Patient ID: Trevor [name], male   DOB: [birth year], 71 y.o.   MRN: [id number]  Have reviewed conf with pt and alerted him that MD feels that we can resume plans for SNF @ [place].  He is agreeable and I have left a VM for admissions coordinator and request to contact me to discuss if bed still available and if they can still offer - if all ok then will need to submit request from insurance.  Will keep team posted on progress for SNF transfer.  Zaharah Amir, LCSW

## 2018-07-12 NOTE — Progress Notes (Signed)
Occupational Therapy Session Note  Patient Details  Name: Trevor [name] MRN: [id number] Date of Birth: 07-24-1946  Today's Date: 07/12/2018 OT Individual Time: 0800-0900 OT Individual Time Calculation (min): 60 min    Short Term Goals: Week 3:  OT Short Term Goal 1 (Week 3): LTG = STG 2/2 ELOS  Skilled Therapeutic Interventions/Progress Updates:    Pt greeted semi-reclined in bed and agreeable to OT treatment session. Pt came to sitting EOB w/ supervision. Slideboard transfer completed bed>wc w/ set-up A. Bathing/dressing completed wc at the sink. Sit<>stand with stedy with min A, assistance to wash buttocks, but pt able to wash peri-area in standing with CGA for balance in STEDY. Figure 4 position to thread B LEs into pants and don socks. Attempted leaning method to pull pants over hips from w/c, but pt unable to clear hips far enough. Pt completed wc push-up while OT assisted with pulling pants over hips. Pt propelled wc to therapy day room and worked on standing balance/endurance in [place] while [employer] Pt needed min A to come to standing, then CGA for balance when "bowling." Pt returned to room and left seated in wc with chair alarm on and needs met.   Therapy Documentation Precautions:  Precautions Precautions: Fall, Back Precaution Comments: spinal precautions: no bending, lifting, twisting, arching Restrictions Weight Bearing Restrictions: No Other Position/Activity Restrictions: No brace needed per orders Pain: Pain Assessment Pain Scale: 0-10 Pain Score: 3  Pain Type: Surgical pain Pain Location: Back Pain Orientation: Lower Pain Descriptors / Indicators: Sore Pain Onset: On-going Pain Intervention(s): Repositioned ADL: ADL ADL Comments: Please see functional navigator for ADL status  See Function Navigator for Current Functional Status.   Therapy/Group: Individual Therapy  Valma Cava 07/12/2018, 9:10 AM

## 2018-07-12 NOTE — Progress Notes (Signed)
[place] PHYSICAL MEDICINE & REHABILITATION     PROGRESS NOTE    Subjective/Complaints: Mouth/face feeling better today. Back less painful also  ROS: Patient denies fever, rash, sore throat, blurred vision, nausea, vomiting, diarrhea, cough, shortness of breath or chest pain, joint or back pain, headache, or mood change.    Objective:  Ct Aspiration  Result Date: 07/11/2018 INDICATION: Status post lumbar fusion on 06/20/2018 with draining lumbar wound and MRI demonstrating fluid collection in the subcutaneous space posterior to the L2-3 level. EXAM: CT-GUIDED ASPIRATION OF PARASPINOUS SOFT TISSUES MEDICATIONS: The patient is currently admitted to the hospital and receiving intravenous antibiotics. The antibiotics were administered within an appropriate time frame prior to the initiation of the procedure. ANESTHESIA/SEDATION: Fentanyl 50 mcg IV; Versed 2.0 mg IV Moderate Sedation Time:  10 minutes. The patient was continuously monitored during the procedure by the interventional radiology nurse under my direct supervision. COMPLICATIONS: None immediate. PROCEDURE: Informed written consent was obtained from the patient after a thorough discussion of the procedural risks, benefits and alternatives. All questions were addressed. Maximal Sterile Barrier Technique was utilized including caps, mask, sterile gowns, sterile gloves, sterile drape, hand hygiene and skin antiseptic. A timeout was performed prior to the initiation of the procedure. CT was performed in a prone position. Under CT guidance, an 18 gauge spinal needle was advanced into the posterior paraspinous soft tissues at the L2-3 level to the left of midline. Aspiration was performed. A fluid sample was sent for culture analysis. FINDINGS: Aspiration yielded approximately 1 mL of blood tinged fluid. IMPRESSION: CT-guided aspiration at the level of the draining wound in the subcutaneous paraspinal soft tissues. Aspiration yielded 1 mL of blood  tinged fluid. This sample was sent for culture analysis. Electronically Signed   By: Aletta Edouard M.D.   On: 07/11/2018 15:36   Recent Labs    07/10/18 0546 07/12/18 0314  WBC 7.2 5.9  HGB 11.8* 11.8*  HCT 36.0* 36.1*  PLT 85* 98*   Recent Labs    07/10/18 0546 07/12/18 0314  NA 134* 136  K 4.8 4.1  CL 98 101  GLUCOSE 99 114*  BUN 18 14  CREATININE 0.75 0.66  CALCIUM 8.8* 8.7*   CBG (last 3)  No results for input(s): GLUCAP in the last 72 hours.  Wt Readings from Last 3 Encounters:  06/22/18 82.7 kg (182 lb 4.8 oz)  06/22/18 81.6 kg (180 lb)  06/18/18 81.6 kg (180 lb)     Intake/Output Summary (Last 24 hours) at 07/12/2018 0936 Last data filed at 07/11/2018 1822 Gross per 24 hour  Intake 610 ml  Output -  Net 610 ml    Vital Signs: Blood pressure 132/77, pulse 70, temperature 98.1 F (36.7 C), temperature source Oral, resp. rate 16, height 5\' 8"  (1.727 m), weight 82.7 kg (182 lb 4.8 oz), SpO2 96 %. Physical Exam:  Constitutional: No distress . Vital signs reviewed. HEENT: EOMI, oral membranes moist Neck: supple Cardiovascular: RRR without murmur. No JVD    Respiratory: CTA Bilaterally without wheezes or rales. Normal effort    GI: BS +, non-tender, non-distended  Musc:   LB TTP Neurological:alert Speech clear.  Able to follow basic commands without difficulty. Motor:  B/l LE: Hip flexion 2/5, knee extension 3+/5, ankle dorsiflexion 4 -/5 (right stronger than left, stable Sensation intact light touch Skin:surgical incision with ongoing serous/orange tinted drainage, lips blistered, macular perioral facial rash extending an inch+ from lips Psych: irritable  Assessment/Plan: 1. 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 Physiatrist is providing close team supervision and 24 hour management of active medical problems listed  below. Physiatrist and rehab team continue to assess barriers to discharge/monitor patient progress toward functional and medical goals.  Function:  Bathing Bathing position   Position: Wheelchair/chair at sink  Bathing parts Body parts bathed by patient: Left arm, Right arm, Abdomen, Chest, Front perineal area, Buttocks, Right upper leg, Left upper leg, Right lower leg, Left lower leg, Back Body parts bathed by helper: Back, Buttocks  Bathing assist Assist Level: Supervision or verbal cues Assistive Device Comment: LH sponge    Upper Body Dressing/Undressing Upper body dressing   What is the patient wearing?: Pull over shirt/dress     Pull over shirt/dress - Perfomed by patient: Thread/unthread right sleeve, Thread/unthread left sleeve, Put head through opening          Upper body assist Assist Level: No help, No cues   Set up : To obtain clothing/put away  Lower Body Dressing/Undressing Lower body dressing   What is the patient wearing?: Pants, Non-skid slipper socks     Pants- Performed by patient: Thread/unthread right pants leg, Thread/unthread left pants leg Pants- Performed by helper: Pull pants up/down Non-skid slipper socks- Performed by patient: Don/doff right sock, Don/doff left sock Non-skid slipper socks- Performed by helper: Don/doff right sock, Don/doff left sock     Shoes - Performed by patient: Don/doff right shoe, Don/doff left shoe, Fasten right, Fasten left       TED Hose - Performed by patient: Don/doff right TED hose, Don/doff left TED hose TED Hose - Performed by helper: Don/doff left TED hose, Don/doff right TED hose  Lower body assist Assist for lower body dressing: Touching or steadying assistance (Pt > 75%)   Set up : To obtain clothing/put away  Toileting Toileting Toileting activity did not occur: No continent bowel/bladder event Toileting steps completed by patient: Adjust clothing after toileting, Adjust clothing prior to toileting,  Performs perineal hygiene Toileting steps completed by helper: Performs perineal hygiene, Adjust clothing after toileting Toileting Assistive Devices: Grab bar or rail  Toileting assist Assist level: Supervision or verbal cues   Transfers Chair/bed transfer   Chair/bed transfer method: Lateral scoot Chair/bed transfer assist level: Touching or steadying assistance (Pt > 75%) Chair/bed transfer assistive device: Mechanical lift Mechanical lift: Stedy   Locomotion Ambulation Ambulation activity did not occur: Safety/medical concerns   Max distance: 30 Assist level: 2 helpers   Wheelchair   Type: Manual Max wheelchair distance: 150 Assist Level: No help, No cues, assistive device, takes more than reasonable amount of time  Cognition Comprehension Comprehension assist level: Follows complex conversation/direction with no assist  Expression Expression assist level: Expresses complex ideas: With no assist  Social Interaction Social Interaction assist level: Interacts appropriately with others - No medications needed.  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   Will be ok to dc tomorrow 2. DVT Prophylaxis/Anticoagulation: Mechanical:Sequential compression devices, below kneeBilateral lower extremities 3. Pain Management:Celebrex d/ced    .  Kpad for back ordered 4. Mood:team to provide ego support. LCSW to follow for evaluation and support.  - trazodone for sleep disturbance  -encouraged him to use later if needed. 5. Neuropsych: This patientiscapable of making decisions  on hisown behalf. 6.  Wound Care/ID:Back/surgical wound dehiscence over weekend.   -blood, wound cx + for proteus, Ucx with multispecies, Blood cx ID panel indicated enterobacter also  -wbc's normal,  platelets holding at 85k  -MRI of lumbar spine with fluid behind op site  -  appreciate ID help--->amoxicillin only  -opsite drainage   per INR 1cc   -outpt ortho follow up early next week   -continue wound care  -perioral rash from ?ctx?----much improved today   -zovirax and triamcinalone cream ordered   -now on amoxil alone    7. Fluids/Electrolytes/Nutrition:  -continue to push fluids.   -potassium 4.6 on 7/26  -celebrex d/ced 8. Urinary retention/BPH/neurogenic bladder:H/o hypospadias.  -Retention has resolved 9. HTN: Monitor BP bid--  and lisinopril. HCTZ stopped Vitals:   07/12/18 0452 07/12/18 0744  BP: (!) 151/80 132/77  Pulse: 70   Resp: 16   Temp: 98.1 F (36.7 C)   SpO2: 96%    Controlled on 8/1 10. Steroid induced hypoglycemia on Prediabetes: CM diet. 11. GERD:  continue PPI due to h/o of gastritis   13. Neurogenic bowel:   Bowel regimen increased on 7/26 14. Acute blood loss anemia  Hemoglobin 11.8  y  Cont to monitor 15. Thrombocytopenia  98k     LOS (Days) [place] EVALUATION WAS PERFORMED  Meredith Staggers, MD 07/12/2018 9:36 AM

## 2018-07-12 NOTE — Progress Notes (Signed)
Occupational Therapy Session Note  Patient Details  Name: Trevor [name] MRN: [id number] Date of Birth: 02-06-1946  Today's Date: 07/12/2018 OT Individual Time: 1130-1157 OT Individual Time Calculation (min): 27 min    Short Term Goals: Week 3:  OT Short Term Goal 1 (Week 3): LTG = STG 2/2 ELOS  Skilled Therapeutic Interventions/Progress Updates:    Pt completed transfer from supine to sit EOB with supervision.  Min assist for transfer to the wheelchair scoot pivot without use of the sliding board.  He was able to then roll himself to the dayroom where he worked on LE strengthening with use of the [employer].  Resistance set on 80 cm/sec with pt completing 4 sets of 30 second intervals with min assist to maintain the LLE in alignment with the knee.  He then completed 2 sets of 1 minute as well.  Pt needing 30 second to 1 minute rest breaks between each set.  Once completed, pt rolled himself back to the room and completed setup for transfer back to the bed.  Min assist for transfer back to the bed scoot pivot again.  Pt was able to transition to supine with supervision, self managing his LEs with his hands, however he did need therapist assist with straightening out the LLE.  Call button and phone in reach with bed alarm in place.    Therapy Documentation Precautions:  Precautions Precautions: Fall, Back Precaution Comments: spinal precautions: no bending, lifting, twisting, arching Restrictions Weight Bearing Restrictions: No Other Position/Activity Restrictions: No brace needed per orders  Pain: Pain Assessment Pain Scale: Faces Pain Score: 3  Faces Pain Scale: Hurts a little bit Pain Type: Surgical pain Pain Location: Back Pain Orientation: Lower Pain Descriptors / Indicators: Discomfort Pain Onset: With Activity Pain Intervention(s): Repositioned ADL: See Function Navigator for Current Functional Status.   Therapy/Group: Individual Therapy  Arriyah Madej OTR/L 07/12/2018, 12:52  PM

## 2018-07-12 NOTE — Progress Notes (Signed)
Occupational Therapy Session Note  Patient Details  Name: Trevor [name] MRN: [id number] Date of Birth: Dec 28, 1945  Today's Date: 07/12/2018 OT Individual Time: 0601-5615 OT Individual Time Calculation (min): 25 min    Short Term Goals: Week 3:  OT Short Term Goal 1 (Week 3): LTG = STG 2/2 ELOS  Skilled Therapeutic Interventions/Progress Updates:    Treatment session with focus on sit > stand and standing tolerance.  Pt received upright in w/c reporting desire to continue working on standing and requesting use of standing frame.  Pt propelled w/c to Dayroom without assist.  Engaged in sit > stand with heavy reliance on BUE and bracing at BLE (from standing frame) during sit > stand.  Pt tolerated standing 7-8 mins while engaging in table top activity.  Pt able to maintain standing with standing frame sling loosened to decrease reliance on support.  Pt reports pain in lower back.  Returned to sitting and pt propelled back to room and notified RN of pain.  Pt left upright in w/c with all needs in reach and RN arriving.  Therapy Documentation Precautions:  Precautions Precautions: Fall, Back Precaution Comments: spinal precautions: no bending, lifting, twisting, arching Restrictions Weight Bearing Restrictions: No Other Position/Activity Restrictions: No brace needed per orders General:   Vital Signs: Therapy Vitals Temp: 98.9 F (37.2 C) Temp Source: Oral Pulse Rate: 79 Resp: 20 BP: 117/60 Patient Position (if appropriate): Lying Oxygen Therapy SpO2: 98 % O2 Device: Room Air Pain: Pain Assessment Pain Scale: 0-10 Pain Score: 5  Faces Pain Scale: Hurts a little bit Pain Type: Surgical pain Pain Location: Back Pain Orientation: Left;Lower Pain Descriptors / Indicators: Discomfort Pain Frequency: Intermittent Pain Onset: On-going Patients Stated Pain Goal: 2 Pain Intervention(s): Medication (See eMAR)  See Function Navigator for Current Functional  Status.   Therapy/Group: Individual Therapy  Simonne Come 07/12/2018, 4:11 PM

## 2018-07-12 NOTE — Progress Notes (Signed)
Physical Therapy Session Note  Patient Details  Name: Trevor [name] MRN: [id number] Date of Birth: [birth year]  Today's Date: 07/12/2018 PT Individual Time: 0930-1045 PT Individual Time Calculation (min): 75 min   Short Term Goals: Week 3:  PT Short Term Goal 1 (Week 3): = LTG  Skilled Therapeutic Interventions/Progress Updates:    Pt received seated in w/c in room, agreeable to PT. Pt reports 2/10 pain in low back, some increase in back pain with standing activity during therapy session but pain subsides with seated rest break. Manual w/c propulsion x 150 ft throughout unit and across thresholds and uneven ground outdoors with BUE Mod I. Lateral scoot transfer w/c to/from mat table with CGA to min A for weight shift at hips, v/c for safe transfer technique. Sit to stand x 5 reps from elevated mat table to RW with mod A, v/c for forward weight shift, B knees blocked. Sit to stand in // bars with mod A, B knees blocked. Standing BLE NMR in // bars: forward alt R/L toe-taps, forward alt R/L step fwd then back. Pt's L knee buckles after 4 reps of stepping fwd/back, requires max A to return to sitting safely in w/c. Nustep level 4 x 10 min with B UE/LE with focus on LE engagement. Seated BLE therex x 10 reps with AAROM. Lateral scoot transfer back to bed at end of session with CGA. Sit to supine with min A for LLE management. Pt left semi-reclined in bed with needs in reach and bed alarm in place.  Therapy Documentation Precautions:  Precautions Precautions: Fall, Back Precaution Comments: spinal precautions: no bending, lifting, twisting, arching Restrictions Weight Bearing Restrictions: No Other Position/Activity Restrictions: No brace needed per orders  See Function Navigator for Current Functional Status.   Therapy/Group: Individual Therapy  Excell Seltzer, PT, DPT  07/12/2018, 12:09 PM

## 2018-07-13 ENCOUNTER — Inpatient Hospital Stay (HOSPITAL_COMMUNITY): Payer: Medicare Other | Admitting: Occupational Therapy

## 2018-07-13 ENCOUNTER — Inpatient Hospital Stay (HOSPITAL_COMMUNITY): Payer: Medicare Other | Admitting: Physical Therapy

## 2018-07-13 ENCOUNTER — Inpatient Hospital Stay (HOSPITAL_COMMUNITY): Payer: Medicare Other

## 2018-07-13 DIAGNOSIS — T8189XS Other complications of procedures, not elsewhere classified, sequela: Secondary | ICD-10-CM

## 2018-07-13 DIAGNOSIS — R7881 Bacteremia: Secondary | ICD-10-CM

## 2018-07-13 MED ORDER — ACETAMINOPHEN 325 MG PO TABS: 325.0000 mg | ORAL_TABLET | ORAL | Status: DC | PRN

## 2018-07-13 MED ORDER — POLYETHYLENE GLYCOL 3350 17 G PO PACK: 17.0000 g | PACK | Freq: Two times a day (BID) | ORAL | 0 refills | Status: DC

## 2018-07-13 MED ORDER — LISINOPRIL 10 MG PO TABS: 10.0000 mg | ORAL_TABLET | Freq: Every day | ORAL | Status: DC

## 2018-07-13 MED ORDER — TRIAMCINOLONE ACETONIDE 0.025 % EX CREA: TOPICAL_CREAM | Freq: Two times a day (BID) | CUTANEOUS | 0 refills | Status: DC

## 2018-07-13 MED ORDER — TRAZODONE HCL 50 MG PO TABS: 50.0000 mg | ORAL_TABLET | Freq: Every evening | ORAL | Status: DC | PRN

## 2018-07-13 MED ORDER — PANTOPRAZOLE SODIUM 40 MG PO TBEC: 40.0000 mg | DELAYED_RELEASE_TABLET | Freq: Every day | ORAL | Status: DC

## 2018-07-13 MED ORDER — VALACYCLOVIR HCL 500 MG PO TABS: 500.0000 mg | ORAL_TABLET | Freq: Three times a day (TID) | ORAL | Status: DC

## 2018-07-13 MED ORDER — OXYCODONE HCL 10 MG PO TABS: 10.0000 mg | ORAL_TABLET | ORAL | 0 refills | Status: DC | PRN

## 2018-07-13 MED ORDER — SENNOSIDES-DOCUSATE SODIUM 8.6-50 MG PO TABS: 2.0000 | ORAL_TABLET | Freq: Two times a day (BID) | ORAL | Status: DC

## 2018-07-13 MED ORDER — ACYCLOVIR 5 % EX OINT: TOPICAL_OINTMENT | CUTANEOUS | Status: AC

## 2018-07-13 MED ORDER — TRAMADOL HCL 50 MG PO TABS: 50.0000 mg | ORAL_TABLET | Freq: Four times a day (QID) | ORAL | 0 refills | Status: DC | PRN

## 2018-07-13 MED ORDER — AMOXICILLIN 500 MG PO CAPS: 500.0000 mg | ORAL_CAPSULE | Freq: Three times a day (TID) | ORAL | Status: DC

## 2018-07-13 NOTE — Progress Notes (Signed)
Occupational Therapy Daily note  Patient Details  Name: Trevor [name] MRN: [id number] Date of Birth: [birth year]  Today's Date: 07/13/2018    OT Individual Time: 1130-1205   35 min  Individual Treatment session:   Skilled OT intervention focused on the following:  strengthening, transfer training, wc mobility, Home exercise program.  Pt propelled wc to ADL apt.  Pt independent with wc set up and SBA with transfer to sofa (from high to low surface).  Went over [employer].  OT provided written hand out for pt to follow.   Pt returned exercises with no verbal cues after practice x2.  OT instructed pt on core body work.   Pt propelled wc back to room and left with all needs in reach.       No reports of pain- had just taken pain meds.   See Function Navigator for Current Functional Status.  Lisa Roca 07/13/2018, 2:43 PM

## 2018-07-13 NOTE — Progress Notes (Signed)
Social Work  Discharge Note  The overall goal for the admission was met for:   Discharge location: No - determined today that pt to transfer to [facility] under Dr. Rhea Bleacher care to address medical issues  Length of Stay: No - transferred to [facility]  Discharge activity level: Yes - min assist w/c level overall  Home/community participation: No  Services provided included: MD, RD, PT, OT, RN, TR, Pharmacy and [place]: [facility] Medicare  Follow-up services arranged: Other: transferred to [facility]  Comments (or additional information):   Patient/Family verbalized understanding of follow-up arrangements: Yes  Individual responsible for coordination of the follow-up plan: pt  Confirmed correct DME delivered: NA  Mont Jagoda

## 2018-07-13 NOTE — Progress Notes (Signed)
Social Work Patient ID: Trevor [name], male   DOB: 15-May-1946, 72 y.o.   MRN: [id number]   Have called [facility] to request pick up and transport to [place].  RN to call report to [phone].  Pt aware and have left room information for his brother, [name], on VM.  Hessie Varone, LCSW

## 2018-07-13 NOTE — Progress Notes (Signed)
[facility] PHYSICAL MEDICINE & REHABILITATION     PROGRESS NOTE    Subjective/Complaints: Pt without new complaints. Face/mouth still raw but improving. Back without new pain  ROS: Patient denies fever, rash, sore throat, blurred vision, nausea, vomiting, diarrhea, cough, shortness of breath or chest pain,   headache, or mood change.   Objective:  Ct Aspiration  Result Date: 07/11/2018 INDICATION: Status post lumbar fusion on 06/20/2018 with draining lumbar wound and MRI demonstrating fluid collection in the subcutaneous space posterior to the L2-3 level. EXAM: CT-GUIDED ASPIRATION OF PARASPINOUS SOFT TISSUES MEDICATIONS: The patient is currently admitted to the hospital and receiving intravenous antibiotics. The antibiotics were administered within an appropriate time frame prior to the initiation of the procedure. ANESTHESIA/SEDATION: Fentanyl 50 mcg IV; Versed 2.0 mg IV Moderate Sedation Time:  10 minutes. The patient was continuously monitored during the procedure by the interventional radiology nurse under my direct supervision. COMPLICATIONS: None immediate. PROCEDURE: Informed written consent was obtained from the patient after a thorough discussion of the procedural risks, benefits and alternatives. All questions were addressed. Maximal Sterile Barrier Technique was utilized including caps, mask, sterile gowns, sterile gloves, sterile drape, hand hygiene and skin antiseptic. A timeout was performed prior to the initiation of the procedure. CT was performed in a prone position. Under CT guidance, an 18 gauge spinal needle was advanced into the posterior paraspinous soft tissues at the L2-3 level to the left of midline. Aspiration was performed. A fluid sample was sent for culture analysis. FINDINGS: Aspiration yielded approximately 1 mL of blood tinged fluid. IMPRESSION: CT-guided aspiration at the level of the draining wound in the subcutaneous paraspinal soft tissues. Aspiration yielded 1 mL  of blood tinged fluid. This sample was sent for culture analysis. Electronically Signed   By: Aletta Edouard M.D.   On: 07/11/2018 15:36   Recent Labs    07/12/18 0314  WBC 5.9  HGB 11.8*  HCT 36.1*  PLT 98*   Recent Labs    07/12/18 0314  NA 136  K 4.1  CL 101  GLUCOSE 114*  BUN 14  CREATININE 0.66  CALCIUM 8.7*   CBG (last 3)  No results for input(s): GLUCAP in the last 72 hours.  Wt Readings from Last 3 Encounters:  06/22/18 82.7 kg (182 lb 4.8 oz)  06/22/18 81.6 kg (180 lb)  06/18/18 81.6 kg (180 lb)     Intake/Output Summary (Last 24 hours) at 07/13/2018 1026 Last data filed at 07/12/2018 1730 Gross per 24 hour  Intake 480 ml  Output -  Net 480 ml    Vital Signs: Blood pressure 115/62, pulse 75, temperature 98.6 F (37 C), temperature source Oral, resp. rate 18, height 5\' 8"  (1.727 m), weight 82.7 kg (182 lb 4.8 oz), SpO2 96 %. Physical Exam:  Constitutional: No distress . Vital signs reviewed. HEENT: EOMI, oral membranes moist. Per-oral lesions scabbing/dry. Macular areas nearly gone Neck: supple Cardiovascular: RRR without murmur. No JVD    Respiratory: CTA Bilaterally without wheezes or rales. Normal effort    GI: BS +, non-tender, non-distended   Musc:   LB TTP Neurological:alert Speech clear.  Able to follow basic commands without difficulty. Motor:  B/l LE: Hip flexion 2/5, knee extension 3+/5, ankle dorsiflexion 4-/5 (right stronger than left, stable Sensation intact light touch Skin:surgical site with serosang discharge, no odor Psych: cooperative  Assessment/Plan: 1. 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 Physiatrist is providing close team supervision and 24 hour management of active medical problems listed below. Physiatrist and rehab team continue to assess barriers to discharge/monitor patient progress toward  functional and medical goals.  Function:  Bathing Bathing position   Position: Wheelchair/chair at sink  Bathing parts Body parts bathed by patient: Left arm, Right arm, Abdomen, Chest, Front perineal area, Buttocks, Right upper leg, Left upper leg, Right lower leg, Left lower leg, Back Body parts bathed by helper: Back, Buttocks  Bathing assist Assist Level: Supervision or verbal cues Assistive Device Comment: LH sponge    Upper Body Dressing/Undressing Upper body dressing   What is the patient wearing?: Pull over shirt/dress     Pull over shirt/dress - Perfomed by patient: Thread/unthread right sleeve, Thread/unthread left sleeve, Put head through opening          Upper body assist Assist Level: No help, No cues   Set up : To obtain clothing/put away  Lower Body Dressing/Undressing Lower body dressing   What is the patient wearing?: Pants, Non-skid slipper socks     Pants- Performed by patient: Thread/unthread right pants leg, Thread/unthread left pants leg Pants- Performed by helper: Pull pants up/down Non-skid slipper socks- Performed by patient: Don/doff right sock, Don/doff left sock Non-skid slipper socks- Performed by helper: Don/doff right sock, Don/doff left sock     Shoes - Performed by patient: Don/doff right shoe, Don/doff left shoe, Fasten right, Fasten left       TED Hose - Performed by patient: Don/doff right TED hose, Don/doff left TED hose TED Hose - Performed by helper: Don/doff left TED hose, Don/doff right TED hose  Lower body assist Assist for lower body dressing: Touching or steadying assistance (Pt > 75%)   Set up : To obtain clothing/put away  Toileting Toileting Toileting activity did not occur: No continent bowel/bladder event Toileting steps completed by patient: Adjust clothing after toileting, Adjust clothing prior to toileting, Performs perineal hygiene Toileting steps completed by helper: Performs perineal hygiene, Adjust clothing after  toileting Toileting Assistive Devices: Grab bar or rail  Toileting assist Assist level: Supervision or verbal cues   Transfers Chair/bed transfer   Chair/bed transfer method: Lateral scoot Chair/bed transfer assist level: Touching or steadying assistance (Pt > 75%) Chair/bed transfer assistive device: Armrests Mechanical lift: Stedy   Locomotion Ambulation Ambulation activity did not occur: Safety/medical concerns   Max distance: 10 ft Assist level: Touching or steadying assistance (Pt > 75%)   Wheelchair   Type: Manual Max wheelchair distance: 150 ft Assist Level: No help, No cues, assistive device, takes more than reasonable amount of time  Cognition Comprehension Comprehension assist level: Follows complex conversation/direction with no assist  Expression Expression assist level: Expresses complex ideas: With no assist  Social Interaction Social Interaction assist level: Interacts appropriately with others - No medications needed.  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   Surgeon has decided he wants to transfer him to [facility] for ?surgical debridement of wound. Team working on facilitating transfer today 2. DVT Prophylaxis/Anticoagulation: Mechanical:Sequential compression devices, below kneeBilateral lower extremities 3. Pain Management:Celebrex d/ced    .  Kpad for back ordered 4. Mood:team to provide ego support. LCSW to follow for evaluation and support.  - trazodone for sleep disturbance  -encouraged him to use later if needed. 5. Neuropsych: This  patientiscapable of making decisions on hisown behalf. 6.  Wound Care/ID:Back/surgical wound dehiscence over weekend.   -blood, wound cx + for proteus, Ucx with multispecies, Blood cx ID panel indicated  enterobacter also  -wbc's normal, platelets holding at 85k  -MRI of lumbar spine with fluid behind op site  -  appreciate ID help--->amoxicillin only  -opsite drainage   per INR 1cc only   -continue local wound care   -potential I and D per surgery  -perioral rash from ?ctx vs HSV?----improved   -zovirax and triamcinalone cream        7. Fluids/Electrolytes/Nutrition:  -continue to push fluids.   -potassium 4.6 on 7/26  -celebrex d/ced 8. Urinary retention/BPH/neurogenic bladder:H/o hypospadias.  -Retention has resolved 9. HTN: Monitor BP bid--  and lisinopril. HCTZ stopped Vitals:   07/12/18 2101 07/13/18 0341  BP: (!) 109/56 115/62  Pulse: 67 75  Resp: 18 18  Temp: 98.6 F (37 C) 98.6 F (37 C)  SpO2: 99% 96%   Controlled on 8/2 10. Steroid induced hypoglycemia on Prediabetes: CM diet. 11. GERD:  continue PPI due to h/o of gastritis   13. Neurogenic bowel:   Bowel regimen increased on 7/26 14. Acute blood loss anemia  Hemoglobin 11.8  y  Cont to monitor 15. Thrombocytopenia  98k on 8/1---no labs ordered for today     LOS (Days) 21 A FACE TO FACE EVALUATION WAS PERFORMED  Meredith Staggers, MD 07/13/2018 10:26 AM

## 2018-07-13 NOTE — Progress Notes (Signed)
Social Work Patient ID: Trevor [name], male   DOB: 18-Feb-1946, 72 y.o.   MRN: [id number]  Alerted by Algis Liming, PA this morning that [name] has requested pt be transferred to [facility].  Awaiting contact from [facility] transfer dept for bed information and clearance to transport over.  Pt aware.  [name], LCSW

## 2018-07-13 NOTE — Progress Notes (Addendum)
Pt is prepping to be discharged to [facility]. Pt admits to feeling a little over whelmed but stated he understands why he's being transferred and has not questions at the moment. Pain medication was given this morning for lower back pain. Dressing to lower back has been changed as ordered. Drainage still present. Report was called into [facility] at 12:36 pm. Nurse is aware that pt is about to be picked up. [facility] arrived at 12:40pm to pick up pt.

## 2018-07-13 NOTE — Progress Notes (Signed)
Physical Therapy Session Note  Patient Details  Name: Trevor [name] MRN: [id number] Date of Birth: 1946-10-15  Today's Date: 07/13/2018 PT Individual Time: 0807-0904 PT Individual Time Calculation (min): 57 min   Short Term Goals: Week 3:  PT Short Term Goal 1 (Week 3): = LTG  Skilled Therapeutic Interventions/Progress Updates:  Pt received in w/c & agreeable to tx. Pt noting 4/10 back pain & RN aware & administered meds during session. Pt propelled w/c around unit with mod I. In parallel bars pt transferred sit>stand with min assist by pulling up on bars with BUE (pt with LOB & decreased support when attempting to transfer and pushing up on armrest with 1 UE). Gait x 10 ft (5 ft forwards + 5 ft backwards) x 2 with min assist with no knee buckling noted with task focusing on BLE strengthening. Transitioned to BLE strengthening exercises with 5# weights seated in w/c; pt performed BLE long arc quads within available ROM (limited by decreased strength) and attempted hip flexion without weights but unable 2/2 muscle weakness. Pt utilized cybex kinetron in sitting from w/c level, up to 70 cm/sec resistance in several bouts to fatigue. PA (Pam) arrived during session, notifying pt that he would be discharging to [facility] instead of SNF. Pt voiced frustration with overall situation and therapist provided encouragement. At end of session pt left sitting in w/c in room with all needs within reach.   Therapy Documentation Precautions:  Precautions Precautions: Fall, Back Precaution Comments: spinal precautions: no bending, lifting, twisting, arching Restrictions Weight Bearing Restrictions: No Other Position/Activity Restrictions: No brace needed per orders  See Function Navigator for Current Functional Status.   Therapy/Group: Individual Therapy  Waunita Schooner 07/13/2018, 9:17 AM

## 2018-07-13 NOTE — Progress Notes (Signed)
Occupational Therapy Session Note  Patient Details  Name: Trevor [name] MRN: [id number] Date of Birth: 1946/05/18  Today's Date: 07/13/2018 OT Individual Time: 1015-1100 OT Individual Time Calculation (min): 45 min    Short Term Goals: Week 2:  OT Short Term Goal 1 (Week 2): Pt will complete toilet/BSC transfer with no more than Mod A OT Short Term Goal 1 - Progress (Week 2): Met OT Short Term Goal 2 (Week 2): Pt will utilize leaning method in shower to wash buttocks OT Short Term Goal 2 - Progress (Week 2): Met OT Short Term Goal 3 (Week 2): Pt will complete 2 grooming tasks in supported standing  OT Short Term Goal 3 - Progress (Week 2): Met OT Short Term Goal 4 (Week 2): Pt will follow back precautions within BADL tasks with no more than min questioning cues OT Short Term Goal 4 - Progress (Week 2): Met  Skilled Therapeutic Interventions/Progress Updates:    1:1. Pt requesting to bathe and dress this session. Pt agreeable to sink level bathing d/t time limitations. Pt completes lateral scoot transfer EOB>w/c>TTB with cut out with supervision and VC for hand placement. Pt completes bathing at sink with lateral leans to wash peri are/buttocks. Pt able to bring BLE up to cross into figure 4 for bathing/dressing lower legs. Pt dons shirt with set up and pants with A to advance pants past hips by performing w/c push up. Pt loads laundry with A to adjust controls that are out of reach. Exited session with pt seated in w/c waiting next therapy with call light inr each  Therapy Documentation Precautions:  Precautions Precautions: Fall, Back Precaution Comments: spinal precautions: no bending, lifting, twisting, arching Restrictions Weight Bearing Restrictions: No Other Position/Activity Restrictions: No brace needed per orders General:   Vital Signs:  Pain: Pain Assessment Pain Scale: 0-10 Pain Score: 3  Pain Location: Back Pain Descriptors / Indicators: Sharp Pain Onset:  On-going Pain Intervention(s): Medication (See eMAR)  See Function Navigator for Current Functional Status.   Therapy/Group: Individual Therapy  [name] 07/13/2018, 10:58 AM

## 2018-07-16 LAB — AEROBIC/ANAEROBIC CULTURE W GRAM STAIN (SURGICAL/DEEP WOUND): Special Requests: NORMAL

## 2018-07-16 LAB — AEROBIC/ANAEROBIC CULTURE (SURGICAL/DEEP WOUND): CULTURE: NO GROWTH

## 2018-07-20 MED ORDER — OXYCODONE HCL 5 MG PO TABS
5.00 | ORAL_TABLET | ORAL | Status: DC
Start: ? — End: 2018-07-20

## 2018-07-20 MED ORDER — ENOXAPARIN SODIUM 40 MG/0.4ML ~~LOC~~ SOLN
40.00 | SUBCUTANEOUS | Status: DC
Start: 2018-07-21 — End: 2018-07-20

## 2018-07-20 MED ORDER — DOCOSANOL 10 % EX CREA
1.00 | TOPICAL_CREAM | CUTANEOUS | Status: DC
Start: 2018-07-20 — End: 2018-07-20

## 2018-07-20 MED ORDER — TRAZODONE HCL 100 MG PO TABS
100.00 | ORAL_TABLET | ORAL | Status: DC
Start: 2018-07-20 — End: 2018-07-20

## 2018-07-20 MED ORDER — CYCLOBENZAPRINE HCL 10 MG PO TABS
10.00 | ORAL_TABLET | ORAL | Status: DC
Start: ? — End: 2018-07-20

## 2018-07-20 MED ORDER — SUCRALFATE 1 G PO TABS
1.00 | ORAL_TABLET | ORAL | Status: DC
Start: 2018-07-20 — End: 2018-07-20

## 2018-07-20 MED ORDER — AMOXICILLIN 500 MG PO CAPS
1000.00 | ORAL_CAPSULE | ORAL | Status: DC
Start: 2018-07-20 — End: 2018-07-20

## 2018-07-20 MED ORDER — LIDOCAINE HCL 1 % IJ SOLN
0.50 | INTRAMUSCULAR | Status: DC
Start: ? — End: 2018-07-20

## 2018-07-20 MED ORDER — SIMVASTATIN 40 MG PO TABS
40.00 | ORAL_TABLET | ORAL | Status: DC
Start: 2018-07-21 — End: 2018-07-20

## 2018-07-20 MED ORDER — ACETAMINOPHEN 325 MG PO TABS
650.00 | ORAL_TABLET | ORAL | Status: DC
Start: ? — End: 2018-07-20

## 2018-07-20 MED ORDER — PANTOPRAZOLE SODIUM 40 MG PO TBEC
40.00 | DELAYED_RELEASE_TABLET | ORAL | Status: DC
Start: 2018-07-21 — End: 2018-07-20

## 2018-07-20 MED ORDER — TRIAMCINOLONE ACETONIDE 0.025 % EX CREA
TOPICAL_CREAM | CUTANEOUS | Status: DC
Start: 2018-07-20 — End: 2018-07-20

## 2018-07-20 MED ORDER — TAMSULOSIN HCL 0.4 MG PO CAPS
.40 | ORAL_CAPSULE | ORAL | Status: DC
Start: 2018-07-21 — End: 2018-07-20

## 2018-08-27 DIAGNOSIS — M2560 Stiffness of unspecified joint, not elsewhere classified: Secondary | ICD-10-CM | POA: Insufficient documentation

## 2018-10-25 ENCOUNTER — Other Ambulatory Visit: Payer: Medicare Other | Admitting: Urology

## 2018-11-01 ENCOUNTER — Other Ambulatory Visit: Payer: Medicare Other | Admitting: Urology

## 2018-11-13 ENCOUNTER — Ambulatory Visit (INDEPENDENT_AMBULATORY_CARE_PROVIDER_SITE_OTHER): Payer: Medicare Other | Admitting: Urology

## 2018-11-13 ENCOUNTER — Encounter: Payer: Self-pay | Admitting: Urology

## 2018-11-13 VITALS — BP 158/92 | HR 98

## 2018-11-13 DIAGNOSIS — Z8551 Personal history of malignant neoplasm of bladder: Secondary | ICD-10-CM

## 2018-11-13 DIAGNOSIS — N400 Enlarged prostate without lower urinary tract symptoms: Secondary | ICD-10-CM

## 2018-11-13 DIAGNOSIS — Z87898 Personal history of other specified conditions: Secondary | ICD-10-CM

## 2018-11-13 LAB — URINALYSIS, COMPLETE
Bilirubin, UA: NEGATIVE
GLUCOSE, UA: NEGATIVE
Ketones, UA: NEGATIVE
LEUKOCYTES UA: NEGATIVE
Nitrite, UA: NEGATIVE
PROTEIN UA: NEGATIVE
Specific Gravity, UA: 1.025 (ref 1.005–1.030)
UUROB: 0.2 mg/dL (ref 0.2–1.0)
pH, UA: 5 (ref 5.0–7.5)

## 2018-11-13 LAB — MICROSCOPIC EXAMINATION
Epithelial Cells (non renal): NONE SEEN /hpf (ref 0–10)
Renal Epithel, UA: NONE SEEN /hpf

## 2018-11-13 NOTE — Progress Notes (Signed)
   11/13/18  CC:  Chief Complaint  Patient presents with  . Cysto    1year    HPI: 72 year old male with a personal history of bladder cancer returns today for annual cystoscopy.  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  He does have a personal history of postoperative urinary retention following back surgery 6 months ago.  He is voiding spontaneously since then.  He is chronically on Flomax.  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  Upper tract imaging since 2011.  Also has a personal history of elevated PSA which is since returned to baseline, most recently 3.1 on 02/2018.  He is having repeat blood work with his primary care at the beginning of the year.   Blood pressure (!) 158/92, pulse 98. NED. A&Ox3.   No respiratory distress   Abd soft, NT, ND Normal phallus with bilateral descended testicles  Cystoscopy Procedure Note  Patient identification was confirmed, informed consent was obtained, and patient was prepped using Betadine solution.  Lidocaine jelly was administered per urethral meatus.     Pre-Procedure: - Inspection reveals a coronal hypospadias  Procedure: The flexible cystoscope was introduced without difficulty through ventral hypospadiac urethral meatus - No urethral strictures/lesions are present.   - Normal prostate  - Normal bladder neck - Bilateral ureteral orifices identified - Bladder mucosa  reveals no ulcers, tumors, or lesions, - No bladder stones - No trabeculation  Retroflexion shows normal prostate/bladder neck.   Post-Procedure: - Patient tolerated the procedure well  Assessment/ Plan:  1. Personal history of malignant neoplasm of  bladder Evidence of disease today on cystoscopy Given is been 5 years since his last recurrence, we discussed the option of discontinuation of screening Through shared decision making, he is elected to continue cystoscopy at least 1 additional year, will reassess next visit - Urinalysis, Complete  2. History of elevated PSA Personal history of elevated PSA which subsequently returned to baseline He is fine to have his PSA checked again early next year by his primary care physician, will return if his PSA rises  3. BPH without urinary obstruction Chronically on finasteride, continue this medication    Return in about 1 year (around 11/14/2019) for cysto.  Hollice Espy, MD

## 2018-12-04 ENCOUNTER — Other Ambulatory Visit: Payer: Self-pay | Admitting: Urology

## 2018-12-04 DIAGNOSIS — N400 Enlarged prostate without lower urinary tract symptoms: Secondary | ICD-10-CM

## 2019-05-10 ENCOUNTER — Other Ambulatory Visit: Payer: Self-pay | Admitting: Neurosurgery

## 2019-05-10 DIAGNOSIS — M5442 Lumbago with sciatica, left side: Secondary | ICD-10-CM

## 2019-05-10 DIAGNOSIS — G8929 Other chronic pain: Secondary | ICD-10-CM

## 2019-05-29 ENCOUNTER — Ambulatory Visit
Admission: RE | Admit: 2019-05-29 | Discharge: 2019-05-29 | Disposition: A | Discharge status: Home / Self Care | Payer: Medicare Other | Source: Ambulatory Visit | Attending: Neurosurgery | Admitting: Neurosurgery

## 2019-05-29 ENCOUNTER — Other Ambulatory Visit: Payer: Self-pay

## 2019-05-29 DIAGNOSIS — G8929 Other chronic pain: Secondary | ICD-10-CM

## 2019-05-29 DIAGNOSIS — M5442 Lumbago with sciatica, left side: Secondary | ICD-10-CM | POA: Insufficient documentation

## 2019-07-17 NOTE — Progress Notes (Signed)
Interviewed patient today over telephone,  Reports lower back pain that is the result of DDD and he has had 2 surgeries Dec 2018 and July 2019 L2, L3.  He has increased pain with prolonged standing or raising his hands over his head.  Had CT and MRI in May 2020.

## 2019-07-17 NOTE — Progress Notes (Signed)
Patient's Name: Trevor [name]  MRN: [id number]  Referring Provider: Meade Maw, MD  DOB: 05-14-1946  PCP: Derinda Late, MD  DOS: 07/18/2019  Note by: Gillis Santa, MD  Service setting: Ambulatory outpatient  Specialty: Interventional Pain Management  Location: [facility] (AMB) Pain Management Facility  Visit type: Initial Patient Evaluation  Patient type: New Patient   Primary Reason(s) for Visit: Encounter for initial evaluation of one or more chronic problems (new to examiner) potentially causing chronic pain, and posing a threat to normal musculoskeletal function. (Level of risk: High) CC: Back Pain (lower)  HPI  Trevor [name] is a 73 y.o. year old, male patient, who comes today to see [name] for the first time for an initial evaluation of his chronic pain. He has Absolute anemia; Benign prostatic hypertrophy without urinary obstruction; Benign essential HTN; Blood glucose elevated; Hypospadias; LBP (low back pain); Malignant neoplasm of lateral wall of urinary bladder ([place]); Microscopic hematuria; HLD (hyperlipidemia); History of neoplasm of bladder; FOM (frequency of micturition); Nocturia; Lumbar radiculopathy; Herniation of lumbar intervertebral disc with radiculopathy; Sleep disturbance; Essential hypertension; Steroid-induced hyperglycemia; Prediabetes; Leukocytosis; Thrombocytopenia ([name]); Acute blood loss anemia; Neurogenic bowel; Postoperative pain; Wound dehiscence, surgical; Herpes stomatitis; Postoperative wound infection; Lumbar surgical wound fluid collection; and Bacteremia on their problem list. Today he comes in for evaluation of his Back Pain (lower)  Pain Assessment: Location: Lower Back Radiating: denies Onset: More than a month ago Duration: Chronic pain Quality: Sharp Severity: 1 /10 (subjective, self-reported pain score)  Note: Reported level is compatible with observation.                         When using our objective Pain Scale, levels between 6 and 10/10 are said to belong  in an emergency room, as it progressively worsens from a 6/10, described as severely limiting, requiring emergency care not usually available at an outpatient pain management facility. At a 6/10 level, communication becomes difficult and requires great effort. Assistance to reach the emergency department may be required. Facial flushing and profuse sweating along with potentially dangerous increases in heart rate and blood pressure will be evident. Effect on ADL: requires frequent rests Timing: Intermittent Modifying factors: sitting BP: (!) 145/80  HR: 67  Onset and Duration: Gradual and Present longer than 3 months Cause of pain: DDD L2, L3 surgery 2018,2019 Severity: Getting worse Timing: During activity or exercise and After activity or exercise Aggravating Factors: Bending, Lifiting and Prolonged standing Alleviating Factors: Resting and Sitting Associated Problems: Weakness Quality of Pain: Aching, Constant, Dull, Sharp and Uncomfortable Previous Examinations or Tests: CT scan and MRI scan Previous Treatments: Epidural steroid injections  The patient comes into the clinics today for the first time for a chronic pain management evaluation.   Patient is a pleasant 73 year old male with a history of L3-L5 XLIF, L5-S1 TLIF, L3-S1 PLIF, on 11/20/2017 who presents with a chief complaint of axial low back as well as bilateral buttock pain and occasional superior thigh and posterior lateral thigh pain.  Patient's pain is been getting worse over time.  He states that he has difficulty walking.  He walks bent over.  He states that he can only walk a block or 2 without having weakness and pain in his legs before having to stop.  He states that his pain does improve when he goes from standing to sitting.  States that his leg weakness and pain is equal in intensity.  Patient has had difficulty with falls  in the past.  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  This was a difficult postoperative course complicated by loss of motor strength in both legs, left greater than right.  Over the course of 1 month, with the help of rehab, patient's strength has improved.  He does wear a left knee brace for support.  He lives independently at home without any help.  Patient has tried bilateral L2-L3 facet joint injections at [facility] clinic which he states helped for a week.  Patient continues to have persistent axial low back pain as well as lower extremity leg weakness.  He is being referred here from neurosurgery for consideration of spinal cord stimulation.  He may also be a candidate for extension of his fusion with lateral lumbar interbody fusion at L2-L3 given adjacent segment disease.   Historic Controlled Substance Pharmacotherapy Review   06/25/2019  1   06/25/2019  Tramadol Hcl 50 MG Tablet  20.00 5 [facility] Yar   [id number]   Sou ([id number])   0  20.00 MME  Comm Ins   [place]  02/28/2019  1   02/28/2019  Hydrocodone-Acetamin 5-325 MG  60.00 10 Ch Yar   [id number]   Sou ([id number])   0  30.00 MME  Comm Ins   [place]    Pharmacodynamics: Desired effects: Analgesia: The patient reports <50% benefit. Reported improvement in function: The patient reports medications have not provided significant benefit Clinically meaningful improvement in function (CMIF): Sustained CMIF goals met Perceived effectiveness: Described as ineffective and would like to make some changes Undesirable effects: Side-effects or Adverse reactions: None reported Historical Monitoring: The patient  reports no history of drug use. List of all UDS Test(s): No results found for: MDMA, COCAINSCRNUR, [name], [name], CANNABQUANT, THCU, [name] List of other Serum/Urine Drug Screening Test(s):  No results found for: AMPHSCRSER, BARBSCRSER, BENZOSCRSER, COCAINSCRSER, COCAINSCRNUR, PCPSCRSER, PCPQUANT, THCSCRSER, THCU, CANNABQUANT, OPIATESCRSER,  OXYSCRSER, PROPOXSCRSER, ETH Historical Background Evaluation: [place] PMP: PDMP reviewed during this encounter. Six (6) year initial data search conducted.              Department of public safety, offender search: Editor, commissioning Information) Non-contributory Risk Assessment Profile: Aberrant behavior: None observed or detected today Risk factors for fatal opioid overdose: None identified today Fatal overdose hazard ratio (HR): Calculation deferred Non-fatal overdose hazard ratio (HR): Calculation deferred Risk of opioid abuse or dependence: 0.7-3.0% with doses ? 36 MME/day and 6.1-26% with doses ? 120 MME/day. Substance use disorder (SUD) risk level: See below Personal History of Substance Abuse (SUD-Substance use disorder):  Alcohol: Negative  Illegal Drugs: Negative  Rx Drugs: Negative  ORT Risk Level calculation: Low Risk Opioid Risk Tool - 07/18/19 0934      Family History of Substance Abuse   Alcohol  Positive Male    Illegal Drugs  Negative    Rx Drugs  Negative      Personal History of Substance Abuse   Alcohol  Negative    Illegal Drugs  Negative    Rx Drugs  Negative      Age   Age between 31-45 years   No      History of Preadolescent Sexual Abuse   History of Preadolescent Sexual Abuse  Negative or Male      Psychological Disease   Psychological Disease  Negative    Depression  Negative      Total Score   Opioid Risk Tool  Scoring  3    Opioid Risk Interpretation  Low Risk      ORT Scoring interpretation table:  Score <3 = Low Risk for SUD  Score between 4-7 = Moderate Risk for SUD  Score >8 = High Risk for Opioid Abuse   PHQ-2 Depression Scale:  Total score: 0  PHQ-2 Scoring interpretation table: (Score and probability of major depressive disorder)  Score 0 = No depression  Score 1 = 15.4% Probability  Score 2 = 21.1% Probability  Score 3 = 38.4% Probability  Score 4 = 45.5% Probability  Score 5 = 56.4% Probability  Score 6 = 78.6% Probability   PHQ-9  Depression Scale:  Total score: 0  PHQ-9 Scoring interpretation table:  Score 0-4 = No depression  Score 5-9 = Mild depression  Score 10-14 = Moderate depression  Score 15-19 = Moderately severe depression  Score 20-27 = Severe depression (2.4 times higher risk of SUD and 2.89 times higher risk of overuse)   Pharmacologic Plan: As per protocol, I have not taken over any controlled substance management, pending the results of ordered tests and/or consults.            Initial impression: Pending review of available data and ordered tests.  Meds   Current Outpatient Medications:  .  acetaminophen (TYLENOL) 325 MG tablet, Take 1-2 tablets (325-650 mg total) by mouth every 4 (four) hours as needed for mild pain. (Patient taking differently: Take 325-650 mg by mouth 3 (three) times daily. ), Disp: , Rfl:  .  ASPIRIN 81 PO, Take 81 mg by mouth daily., Disp: , Rfl:  .  lisinopril-hydrochlorothiazide (ZESTORETIC) 10-12.5 MG tablet, Take 1 tablet by mouth daily., Disp: , Rfl:  .  Multiple Vitamin (MULTIVITAMIN WITH MINERALS) TABS tablet, Take 1 tablet by mouth daily., Disp: , Rfl:  .  naproxen sodium (ALEVE) 220 MG tablet, Take 220 mg by mouth 3 (three) times daily., Disp: , Rfl:  .  Omega-3 Fatty Acids (OMEGA 3 PO), Take by mouth daily., Disp: , Rfl:  .  omeprazole (PRILOSEC) 10 MG capsule, Take 10 mg by mouth daily., Disp: , Rfl:  .  OVER THE COUNTER MEDICATION, Take 1 tablet by mouth daily as needed (constipation). PRUNEX , Disp: , Rfl:  .  senna-docusate (SENOKOT-S) 8.6-50 MG tablet, Take 2 tablets by mouth 2 (two) times daily., Disp: , Rfl:  .  simvastatin (ZOCOR) 40 MG tablet, Take 40 mg by mouth daily., Disp: , Rfl:  .  sucralfate (CARAFATE) 1 G tablet, Take 1.5 g by mouth daily as needed. , Disp: , Rfl:  .  tamsulosin (FLOMAX) 0.4 MG CAPS capsule, TAKE 1 CAPSULE DAILY, Disp: 90 capsule, Rfl: 4 .  traZODone (DESYREL) 50 MG tablet, Take 1 tablet (50 mg total) by mouth at bedtime as needed for  sleep., Disp: , Rfl:  .  diclofenac (VOLTAREN) 50 MG EC tablet, Take 1 tablet (50 mg total) by mouth 2 (two) times daily after a meal., Disp: 60 tablet, Rfl: 1 .  methocarbamol (ROBAXIN) 750 MG tablet, Take 1 tablet (750 mg total) by mouth every 8 (eight) hours as needed for muscle spasms., Disp: 90 tablet, Rfl: 0  Imaging Review   Thoracic Imaging: Thoracic MR wo contrast:  Results for orders placed during the hospital encounter of 06/13/18  MR THORACIC SPINE WO CONTRAST   Narrative CLINICAL DATA:  Initial evaluation for multiple falls, recent worsening symptoms with gait abnormality, imbalance. History of bladder cancer, prior lumbar surgery in  December 2018.  EXAM: MRI THORACIC AND LUMBAR SPINE WITHOUT CONTRAST  TECHNIQUE: Multiplanar and multiecho pulse sequences of the thoracic and lumbar spine were obtained without intravenous contrast.  COMPARISON:  Previous MRI from 07/07/2017.  FINDINGS: MRI THORACIC SPINE FINDINGS  Alignment: Diffuse thoracic dextroscoliosis noted. Alignment otherwise within normal limits with preservation of the normal thoracic kyphosis. No listhesis.  Vertebrae: Vertebral body heights maintained without evidence for acute or chronic fracture. Bone marrow signal intensity within normal limits. Few scattered benign hemangiomata noted. No worrisome osseous lesions. Chronic reactive endplate changes present about the right aspect of the T11-12 interspace due to scoliotic curvature. No abnormal marrow edema.  Cord:  Signal intensity within the thoracic spinal cord is normal.  Paraspinal and other soft tissues: Paraspinous soft tissues demonstrate no acute finding. Visualized lungs are grossly clear. Visualized visceral structures unremarkable.  Disc levels:  T1-2:  Unremarkable.  T2-3: Unremarkable.  T3-4:  Unremarkable.  T4-5:  Mild disc bulge.  No stenosis.  T5-6:  Minimal disc bulge.  No stenosis.  T6-7: Tiny central disc protrusion  minimally indents the ventral thecal sac. No stenosis or cord deformity.  T7-8: Small central disc protrusion minimally indents the ventral thecal sac. No stenosis or cord deformity.  T8-9:  Unremarkable.  T9-10: Mild diffuse disc bulge. Mild posterior element hypertrophy. No significant stenosis.  T10-11: Mild diffuse disc bulge. Moderate facet hypertrophy. Resultant mild canal with bilateral foraminal narrowing, left slightly worse than right.  T11-12: Diffuse disc bulge with intervertebral disc space narrowing and chronic reactive endplate changes. Mild facet hypertrophy. Resultant mild spinal stenosis. Mild right neural foraminal narrowing.  T12-L1: Mild disc bulge. Right greater than left facet hypertrophy. No significant stenosis.  MRI LUMBAR SPINE FINDINGS  Segmentation: Normal segmentation. Lowest well-formed disc labeled the L5-S1 level.  Alignment: Rotoscoliosis with mild straightening of the normal lumbar lordosis. Chronic 3 mm retrolisthesis of L5 on S1 is stable from previous.  Vertebrae: Susceptibility artifact from interval posterior and interbody fusion present at L3 through S1. Vertebral body heights maintained without evidence for acute or interval fracture. Small chronic endplate Schmorl's node present at the superior endplate of L3. Underlying bone marrow signal intensity within normal limits. No discrete or worrisome osseous lesions. No abnormal marrow edema.  Conus medullaris and cauda equina: Conus extends to the L1-2 level. Conus and cauda equina appear normal.  Paraspinal and other soft tissues: Normal expected postoperative changes present within the visualized visceral structures within normal limits.  Disc levels:  L1-2: Mild diffuse disc bulge with disc desiccation. Mild facet hypertrophy, greater on the right. No significant spinal stenosis mild right L1 foraminal narrowing, stable.  L2-3: Diffuse disc bulge with disc desiccation.  There is a new moderate subligamentous central disc extrusion indenting the ventral thecal sac. Slight cephalad and caudad migration of disc material. Disc extrusion measures approximately 9 x 17 x 21 mm (AP by transverse by craniocaudad). Superimposed moderate facet and ligament flavum hypertrophy. There is resultant severe spinal stenosis with impingement upon the nerve roots of the cauda equina. Thecal sac nearly obliterated at this level, measuring approximately 2-3 mm in AP diameter at its most narrow point. Mild left L1 foraminal stenosis, stable. No significant right foraminal encroachment.  L3-4: Sequelae of interval discectomy with posterior and interbody fusion with posterior decompression. Improved spinal stenosis, now mild to moderate in nature. Moderate left greater than right lateral recess narrowing also improved. No neural foraminal encroachment.  L4-5: Sequelae of interval discectomy with interbody and posterior fusion,  with posterior decompression. Improved thecal sac patency, with residual mild stenosis now seen. Persistent moderate right lateral recess narrowing, also improved. Foramina are patent.  L5-S1: Sequelae of interval discectomy with posterior and interbody fusion with posterior decompression. No significant residual spinal stenosis. Moderate bilateral L5 foraminal narrowing due to residual reactive endplate changes and facet hypertrophy, slightly improved relative to previous exam.  IMPRESSION: MR THORACIC SPINE IMPRESSION  1. No acute abnormality within the thoracic spine. 2. Multilevel degenerative spondylolysis with disc bulging and small disc protrusions. Resultant mild canal and bilateral foraminal stenosis at the T10-11 and T11-12 levels as above. 3. Multilevel facet hypertrophy within the lower thoracic spine.  MR LUMBAR SPINE IMPRESSION  1. Postoperative changes from interval posterior and interbody fusion with posterior decompression at  L3 through S1 with improved thecal sac patency as above. No adverse features. 2. 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 3. Residual moderate bilateral L5 foraminal stenosis related to reactive endplate changes and facet hypertrophy.  Results were called by telephone at the time of interpretation on 06/13/2018 at 6:42 pm to Dr. Meade Maw , who verbally acknowledged these results.   Electronically Signed   By: Jeannine Boga M.D.   On: 06/13/2018 18:46     Lumbosacral Imaging: Lumbar MR wo contrast:  Results for orders placed during the hospital encounter of 05/29/19  MR LUMBAR SPINE WO CONTRAST   Narrative CLINICAL DATA:  Persistent back and left  EXAM: MRI LUMBAR SPINE WITHOUT CONTRAST  TECHNIQUE: Multiplanar, multisequence MR imaging of the lumbar spine was performed. No intravenous contrast was administered.  COMPARISON:  CT scan from today and prior MRI lumbar spine 07/10/2018  FINDINGS: Segmentation: There are five lumbar type vertebral bodies. The last full intervertebral disc space is labeled L5-S1. This correlates with the prior MR examination.  Alignment:  Normal  Vertebrae: No acute bony findings or bone lesions. Artifact associated with the fusion hardware from L3-S1. Marked endplate reactive changes are noted at L2-3 and new since the prior MRI.  Conus medullaris and cauda equina: Conus extends to the bottom of L1 level. Conus and cauda equina appear normal.  Paraspinal and other soft tissues: No significant findings.  Disc levels:  L1-2: No significant findings. Mild bulging annulus and moderate facet disease but no significant spinal or foraminal stenosis.  L2-3: Remote laminectomy defect. There is a shallow central recurrent disc protrusion with mass effect on the ventral thecal sac. This in conjunction  with facet disease contributes to mild spinal and moderate bilateral lateral recess stenosis. No significant foraminal stenosis. Far left lateral disc osteophyte complex but no direct L2 nerve root involvement.  L3-4: Posterior and interbody fusion changes. Persistent moderate spinal and bilateral lateral recess stenosis without significant foraminal stenosis. These findings appear stable.  L4-5: Posterior and interbody fusion changes with osteophytic ridging and facet disease contributing to moderate spinal and bilateral lateral recess stenosis and mild bilateral foraminal encroachment. These findings appear stable.  L5-S1: Posterior and interbody fusion changes. No spinal lateral recess stenosis. There is a right-sided disc osteophyte complex with mild right foraminal stenosis. This appears stable.  IMPRESSION: 1. Postoperative changes with fusion hardware from L3-S1 without obvious complicating features. 2. Stable appearing spinal and bilateral lateral recess stenosis at L3-4 and L4-5. 3. Progressive degenerative disc disease at L2-3 with marked endplate reactive changes. There is a central recurrent disc protrusion with mass  effect on the thecal sac contributing to mild spinal and moderate bilateral lateral recess stenosis.   Electronically Signed   By: Marijo Sanes M.D.   On: 05/30/2019 08:53    Lumbar MR w/wo contrast:  Results for orders placed during the hospital encounter of 06/22/18  MR Lumbar Spine W Wo Contrast   Narrative CLINICAL DATA:  Bacteremia with severe lower back pain  EXAM: MRI LUMBAR SPINE WITHOUT AND WITH CONTRAST  TECHNIQUE: Multiplanar and multiecho pulse sequences of the lumbar spine were obtained without and with intravenous contrast.  CONTRAST:  89m MULTIHANCE GADOBENATE DIMEGLUMINE 529 MG/ML IV SOLN  COMPARISON:  06/13/2018  FINDINGS: Segmentation:  5 lumbar type vertebral bodies  Alignment: These mild scoliotic curvature better  seen by radiography  Vertebrae: Recent laminotomy at L2-3 for decompression. There is a rim enhancing fluid collection within the laminectomy defect, extending through the intrinsic back muscles to the subcutaneous space. The dominant deep collection measures 1 x 4 cm on axial slices. The extradural space is enhancing and thickened at L2-3 and there is radicular nerve root enhancement bilaterally extending cranially and caudally. The spinal canal is congenitally narrow and posterior element hypertrophy with small residual disc protrusion continues to cause severe spinal stenosis.  Conus medullaris and cauda equina: Conus extends to the L1-2 level.  Paraspinal and other soft tissues: As noted above  Disc levels:  T12- L1: Spondylosis.  Facet spurring.  No impingement  L1-L2: Spondylosis and disc bulging with facet spurring. No impingement  L2-L3: Described above  L3-L4: PLIF. Residual moderate spinal stenosis from short pedicles, posterior element hypertrophy, and mild residual ridging. The foramina appear patent  L4-L5: PLIF. Bilateral subarticular recess narrowing and mild-to-moderate canal stenosis. The foramina are patent  L5-S1:PLIF. The right L5 nerve root is difficult to appreciate separate from fibrosis and residual spurring. The canal is patent.  IMPRESSION: 1. Fluid collection from subcutaneous space to the epidural space, sterility indeterminate in the setting of dehiscence. 2. Epidural enhancement at L2-3 level could be postoperative inflammation or infectious phlegmon. 3. Radiculitis with bilateral enhancing nerve roots centered at the postoperative level. 4. 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 Previously seen disc herniation has been decompressed but there is still severe spinal stenosis from posterior element hypertrophy, residual disc material, and the epidural soft tissue thickening.   Electronically Signed   By: JMonte FantasiaM.D.   On:  07/10/2018 09:47    Results for orders placed during the hospital encounter of 05/29/19  CT LUMBAR SPINE WO CONTRAST   Narrative CLINICAL DATA:  Chronic back and left leg pain.  EXAM: CT LUMBAR SPINE WITHOUT CONTRAST  TECHNIQUE: Multidetector CT imaging of the lumbar spine was performed without intravenous contrast administration. Multiplanar CT image reconstructions were also generated.  COMPARISON:  MRI lumbar spine 07/10/2018  FINDINGS: Segmentation: There are five lumbar type vertebral bodies. The last full intervertebral disc space is labeled L5-S1. This correlates with the prior MRI.  Alignment: Normal  Vertebrae: No acute vertebral body fracture or worrisome bone lesion. Advanced degenerative lumbar spondylosis with lower thoracic and upper lumbar disc disease. Extensive fusion hardware noted from L3-S1 with pedicle screws, posterior rods and interbody fusion devices.  Paraspinal and other soft tissues: No significant paraspinal or retroperitoneal findings. Severe atherosclerotic calcifications involving the aorta and iliac arteries without aneurysm.  Disc levels: L1-2: Bulging annulus and moderate facet disease but no significant spinal or foraminal stenosis.  L2-3: Severe disc disease and facet disease with vacuum disc phenomenon. Diffuse bulging  degenerated annulus with moderate spinal and bilateral lateral recess stenosis. A right-sided laminectomy defect is noted.  L3-4: Posterior and interbody fusion hardware. I do not see any significant interbody fusion changes. No gross spinal or foraminal stenosis.  L4-5: Posterior and interbody fusion hardware. There are areas of solid interbody fusion noted. Severe facet disease and laminectomy defect. Moderately severe appearing spinal and bilateral lateral recess stenosis no significant foraminal stenosis. This does not appear to be as significant on the MRI from today.  L5-S1: Posterior and interbody fusion  changes with wide decompressive laminectomy. Areas of solid interbody fusion are demonstrated. No spinal stenosis. Moderate left foraminal stenosis.  IMPRESSION: 1. Postoperative changes with L3-S1 fusion hardware. 2. Moderate spinal and bilateral lateral recess stenosis at L2-3. 3. No solid interbody fusion changes at L3-4. 4. Moderately severe spinal and bilateral lateral recess stenosis at L4-5. This does not appear to be as significant on today's MRI. 5. Moderate left foraminal stenosis at L5-S1. 6. No acute bony findings.   Electronically Signed   By: Marijo Sanes M.D.   On: 05/30/2019 08:44    Lumbar DG 2-3 views:  Results for orders placed during the hospital encounter of 06/20/18  DG Lumbar Spine 2-3 Views   Narrative CLINICAL DATA:  Back surgery.  EXAM: LUMBAR SPINE - 2-3 VIEW; DG C-ARM 61-120 MIN  COMPARISON:  Lumbar MRI 06/13/2018  FINDINGS: Lateral C-arm images were obtained in the operating room. Lumbosacral junction not included on the study. Based on the prior MRI, there is pedicle screw fusion L3 through S1. Surgical localizer instrument is present just above the L3 pedicle screws posteriorly.  IMPRESSION: Surgical localization in the operating room.   Electronically Signed   By: Franchot Gallo M.D.   On: 06/20/2018 16:16    Lumbar DG (Complete) 4+V:  Results for orders placed during the hospital encounter of 06/22/18  DG Lumbar Spine Complete   Narrative CLINICAL DATA:  Low back pain  EXAM: LUMBAR SPINE - COMPLETE 4+ VIEW  COMPARISON:  06/20/2018  FINDINGS: There are changes consistent with prior fusion from L3-S1. No hardware failure is seen. No compression deformity is noted. Facet hypertrophic changes are seen. No acute abnormality noted.  IMPRESSION: Postoperative and degenerative changes without acute abnormality.   Electronically Signed   By: Inez Catalina M.D.   On: 07/07/2018 14:57           Complexity Note: Imaging results  reviewed. Results shared with Mr. Wolf, using Layman's terms.                         ROS  Cardiovascular: Daily Aspirin intake and High blood pressure Pulmonary or Respiratory: No reported pulmonary signs or symptoms such as wheezing and difficulty taking a deep full breath (Asthma), difficulty blowing air out (Emphysema), coughing up mucus (Bronchitis), persistent dry cough, or temporary stoppage of breathing during sleep Neurological: No reported neurological signs or symptoms such as seizures, abnormal skin sensations, urinary and/or fecal incontinence, being born with an abnormal open spine and/or a tethered spinal cord Review of Past Neurological Studies: No results found for this or any previous visit. Psychological-Psychiatric: No reported psychological or psychiatric signs or symptoms such as difficulty sleeping, anxiety, depression, delusions or hallucinations (schizophrenial), mood swings (bipolar disorders) or suicidal ideations or attempts Gastrointestinal: No reported gastrointestinal signs or symptoms such as vomiting or evacuating blood, reflux, heartburn, alternating episodes of diarrhea and constipation, inflamed or scarred liver, or pancreas or irrregular  and/or infrequent bowel movements Genitourinary: No reported renal or genitourinary signs or symptoms such as difficulty voiding or producing urine, peeing blood, non-functioning kidney, kidney stones, difficulty emptying the bladder, difficulty controlling the flow of urine, or chronic kidney disease Hematological: No reported hematological signs or symptoms such as prolonged bleeding, low or poor functioning platelets, bruising or bleeding easily, hereditary bleeding problems, low energy levels due to low hemoglobin or being anemic Endocrine: No reported endocrine signs or symptoms such as high or low blood sugar, rapid heart rate due to high thyroid levels, obesity or weight gain due to slow thyroid or thyroid  disease Rheumatologic: No reported rheumatological signs and symptoms such as fatigue, joint pain, tenderness, swelling, redness, heat, stiffness, decreased range of motion, with or without associated rash Musculoskeletal: Negative for myasthenia gravis, muscular dystrophy, multiple sclerosis or malignant hyperthermia Work History: Disabled  Allergies  Mr. Mullin is allergic to ceftriaxone.  Laboratory Chemistry Profile   Screening Lab Results  Component Value Date   STAPHAUREUS NEGATIVE 06/18/2018   MRSAPCR NEGATIVE 06/18/2018    Inflammation (CRP: Acute Phase) (ESR: Chronic Phase) No results found for: CRP, ESRSEDRATE, LATICACIDVEN                       Rheumatology No results found for: RF, ANA, LABURIC, URICUR, LYMEIGGIGMAB, LYMEABIGMQN, HLAB27                      Renal Lab Results  Component Value Date   BUN 14 07/12/2018   CREATININE 0.66 07/12/2018   GFRAA >60 07/12/2018   GFRNONAA >60 07/12/2018                             Hepatic Lab Results  Component Value Date   AST 26 06/23/2018   ALT 23 06/23/2018   ALBUMIN 3.3 (L) 06/23/2018   ALKPHOS 61 06/23/2018                        Electrolytes Lab Results  Component Value Date   NA 136 07/12/2018   K 4.1 07/12/2018   CL 101 07/12/2018   CALCIUM 8.7 (L) 07/12/2018                        Neuropathy Lab Results  Component Value Date   HGBA1C 5.4 03/01/2013                        CNS No results found for: COLORCSF, APPEARCSF, RBCCOUNTCSF, WBCCSF, POLYSCSF, LYMPHSCSF, EOSCSF, PROTEINCSF, GLUCCSF, JCVIRUS, CSFOLI, IGGCSF, LABACHR, ACETBL                      Bone No results found for: Nelson, VD125OH2TOT, [id number], [id number], 25OHVITD1, 25OHVITD2, 25OHVITD3, TESTOFREE, TESTOSTERONE                       Coagulation Lab Results  Component Value Date   INR 1.24 07/11/2018   LABPROT 15.5 (H) 07/11/2018   APTT 29 06/18/2018   PLT 98 (L) 07/12/2018                        Cardiovascular Lab Results   Component Value Date   CKTOTAL 107 03/02/2013   CKMB 2.1 03/02/2013   TROPONINI < 0.02 03/02/2013   HGB 11.8 (  L) 07/12/2018   HCT 36.1 (L) 07/12/2018                         ID Lab Results  Component Value Date   STAPHAUREUS NEGATIVE 06/18/2018   MRSAPCR NEGATIVE 06/18/2018   MICROTEXT  08/27/2013       SOURCE: CLEAN CATCH    COMMENT                   NO GROWTH IN 36 HOURS   ANTIBIOTIC                                                        Cancer No results found for: CEA, CA125, LABCA2                      Endocrine No results found for: TSH, FREET4, TESTOFREE, TESTOSTERONE, SHBG, ESTRADIOL, ESTRADIOLPCT, ESTRADIOLFRE, LABPREG, ACTH                      Note: Lab results reviewed.  [place]  Drug: Mr. Chaddock  reports no history of drug use. Alcohol:  reports no history of alcohol use. Tobacco:  reports that he has never smoked. He has never used smokeless tobacco. Medical:  has a past medical history of Acne, Allergic rhinitis, Anemia, Anxiety, BPH (benign prostatic hypertrophy) with urinary obstruction, Cancer (HCC), Carpal tunnel syndrome, Cervicalgia, Degenerative disc disease, cervical, Depression, Gastritis, GERD (gastroesophageal reflux disease), Hemorrhoids, History of malignant neoplasm of bladder, Hyperlipidemia, Hypertension, Hypospadias, Low back pain, Mitral regurgitation, Pre-diabetes, Sleep apnea, and Urinary frequency. Family: family history includes Heart attack in his father; Hyperlipidemia in his brother and sister.  Past Surgical History:  Procedure Laterality Date  . BLADDER SURGERY     2011, 2013, 2015  . COLONOSCOPY WITH PROPOFOL N/A 07/31/2015   Procedure: COLONOSCOPY WITH PROPOFOL;  Surgeon: Manya Silvas, MD;  Location: [facility];  Service: Endoscopy;  Laterality: N/A;  . COLONOSCOPY, ESOPHAGOGASTRODUODENOSCOPY (EGD) AND ESOPHAGEAL DILATION    . FLEXIBLE SIGMOIDOSCOPY    . LUMBAR LAMINECTOMY/DECOMPRESSION MICRODISCECTOMY N/A 06/20/2018    Procedure: LUMBAR LAMINECTOMY/DECOMPRESSION MICRODISCECTOMY 1 LEVEL-L-2-3;  Surgeon: Meade Maw, MD;  Location: [facility];  Service: Neurosurgery;  Laterality: N/A;  . SEPTOPLASTY    . TONSILLECTOMY     Active Ambulatory Problems    Diagnosis Date Noted  . Absolute anemia 05/13/2014  . Benign prostatic hypertrophy without urinary obstruction 12/10/2012  . Benign essential HTN 05/13/2014  . Blood glucose elevated 05/13/2014  . Hypospadias 12/10/2012  . LBP (low back pain) 05/13/2014  . 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 Microscopic hematuria 12/10/2012  . HLD (hyperlipidemia) 05/13/2014  . History of neoplasm of bladder 12/10/2012  . FOM (frequency of micturition) 12/10/2012  . Nocturia 10/23/2015  . Lumbar radiculopathy 06/20/2018  . Herniation of lumbar intervertebral disc with radiculopathy 06/22/2018  . Sleep disturbance   . Essential hypertension   . Steroid-induced hyperglycemia   . Prediabetes   . Leukocytosis   . Thrombocytopenia ([place])   . Acute blood loss anemia   . Neurogenic bowel   . Postoperative pain   . Wound dehiscence, surgical   . Herpes stomatitis 07/12/2018  . Postoperative wound infection 07/12/2018  . Lumbar surgical wound  fluid collection 07/12/2018  . Bacteremia 07/13/2018   Resolved Ambulatory Problems    Diagnosis Date Noted  . Pain   . Low grade fever    Past Medical History:  Diagnosis Date  . Acne   . Allergic rhinitis   . Anemia   . Anxiety   . BPH (benign prostatic hypertrophy) with urinary obstruction   . Cancer ([place])   . Carpal tunnel syndrome   . Cervicalgia   . Degenerative disc disease, cervical   . Depression   . Gastritis   . GERD (gastroesophageal reflux disease)   . Hemorrhoids   . History of malignant neoplasm of bladder   . Hyperlipidemia   . Hypertension   . Low back pain   . Mitral regurgitation   . Pre-diabetes   . Sleep apnea   . Urinary frequency     Constitutional Exam  General appearance: Well nourished, well developed, and well hydrated. In no apparent acute distress Vitals:   07/18/19 0919  BP: (!) 145/80  Pulse: 67  Resp: 18  Temp: 98.3 F (36.8 C)  TempSrc: Oral  SpO2: 100%  Weight: 182 lb (82.6 kg)  Height: 5' 7"  (1.702 m)   BMI Assessment: Estimated body mass index is 28.51 kg/m as calculated from the following:   Height as of this encounter: 5' 7"  (1.702 m).   Weight as of this encounter: 182 lb (82.6 kg).  BMI interpretation table: BMI level Category Range association with higher incidence of chronic pain  <18 kg/m2 Underweight   18.5-24.9 kg/m2 Ideal body weight   25-29.9 kg/m2 Overweight Increased incidence by 20%  30-34.9 kg/m2 Obese (Class I) Increased incidence by 68%  35-39.9 kg/m2 Severe obesity (Class II) Increased incidence by 136%  >40 kg/m2 Extreme obesity (Class III) Increased incidence by 254%   Patient's current BMI Ideal Body weight  Body mass index is 28.51 kg/m. Ideal body weight: 66.1 kg (145 lb 11.6 oz) Adjusted ideal body weight: 72.7 kg (160 lb 3.8 oz)   BMI Readings from Last 4 Encounters:  07/18/19 28.51 kg/m  06/22/18 27.72 kg/m  06/22/18 27.37 kg/m  06/18/18 27.37 kg/m   Wt Readings from Last 4 Encounters:  07/18/19 182 lb (82.6 kg)  06/22/18 182 lb 4.8 oz (82.7 kg)  06/22/18 180 lb (81.6 kg)  06/18/18 180 lb (81.6 kg)  Psych/Mental status: Alert, oriented x 3 (person, place, & time)       Eyes: PERLA Respiratory: No evidence of acute respiratory distress  Cervical Spine Area Exam  Skin & Axial Inspection: No masses, redness, edema, swelling, or associated skin lesions Alignment: Symmetrical Functional ROM: Unrestricted ROM      Stability: No instability detected Muscle Tone/Strength: Functionally intact. No obvious neuro-muscular anomalies detected. Sensory (Neurological): Unimpaired Palpation: No palpable anomalies              Upper Extremity (UE) Exam     Side: Right upper extremity  Side: Left upper extremity  Skin & Extremity Inspection: Skin color, temperature, and hair growth are WNL. No peripheral edema or cyanosis. No masses, redness, swelling, asymmetry, or associated skin lesions. No contractures.  Skin & Extremity Inspection: Skin color, temperature, and hair growth are WNL. No peripheral edema or cyanosis. No masses, redness, swelling, asymmetry, or associated skin lesions. No contractures.  Functional ROM: Unrestricted ROM          Functional ROM: Unrestricted ROM          Muscle Tone/Strength: Functionally intact. No obvious neuro-muscular  anomalies detected.  Muscle Tone/Strength: Functionally intact. No obvious neuro-muscular anomalies detected.  Sensory (Neurological): Unimpaired          Sensory (Neurological): Unimpaired          Palpation: No palpable anomalies              Palpation: No palpable anomalies              Provocative Test(s):  Phalen's test: deferred Tinel's test: deferred Apley's scratch test (touch opposite shoulder):  Action 1 (Across chest): deferred Action 2 (Overhead): deferred Action 3 (LB reach): deferred   Provocative Test(s):  Phalen's test: deferred Tinel's test: deferred Apley's scratch test (touch opposite shoulder):  Action 1 (Across chest): deferred Action 2 (Overhead): deferred Action 3 (LB reach): deferred    Thoracic Spine Area Exam  Skin & Axial Inspection: No masses, redness, or swelling Alignment: Symmetrical Functional ROM: Unrestricted ROM Stability: No instability detected Muscle Tone/Strength: Functionally intact. No obvious neuro-muscular anomalies detected. Sensory (Neurological): Unimpaired Muscle strength & Tone: No palpable anomalies  Lumbar Spine Area Exam  Skin & Axial Inspection: Well healed scar from previous spine surgery detected Alignment: Asymmetric Functional ROM: Decreased ROM       Stability: No instability detected Muscle Tone/Strength: Functionally intact.  No obvious neuro-muscular anomalies detected. Sensory (Neurological): Musculoskeletal pain pattern and dermatomal Palpation: No palpable anomalies       Provocative Tests: Hyperextension/rotation test: Unable to perform due to fusion restriction. Lumbar quadrant test (Kemp's test): Unable to perform due to fusion restriction. Lateral bending test: (+) due to pain. Patrick's Maneuver: (+) for bilateral S-I arthralgia             FABER* test: deferred today                   S-I anterior distraction/compression test: deferred today         S-I lateral compression test: deferred today         S-I Thigh-thrust test: deferred today         S-I Gaenslen's test: deferred today         *(Flexion, ABduction and External Rotation)  Gait & Posture Assessment  Ambulation: Patient ambulates using a cane Gait: Limited. Using assistive device to ambulate Posture: Difficulty standing up straight, due to pain   Lower Extremity Exam    Side: Right lower extremity  Side: Left lower extremity  Stability: No instability observed          Stability: No instability observed          Skin & Extremity Inspection: Skin color, temperature, and hair growth are WNL. No peripheral edema or cyanosis. No masses, redness, swelling, asymmetry, or associated skin lesions. No contractures.  Skin & Extremity Inspection: Skin color, temperature, and hair growth are WNL. No peripheral edema or cyanosis. No masses, redness, swelling, asymmetry, or associated skin lesions. No contractures.  Functional ROM: Decreased ROM for hip and knee joints          Functional ROM: Decreased ROM for hip and knee joints          Muscle Tone/Strength: Functionally intact. No obvious neuro-muscular anomalies detected.  Muscle Tone/Strength: Functionally intact. No obvious neuro-muscular anomalies detected.  Sensory (Neurological): Neuropathic pain pattern        Sensory (Neurological): Neuropathic pain pattern        DTR: Patellar: 1+:  trace Achilles: 0: absent Plantar: deferred today  DTR: Patellar: 1+: trace Achilles: 0: absent Plantar: deferred  today  Palpation: No palpable anomalies  Palpation: No palpable anomalies   Assessment  Primary Diagnosis & Pertinent Problem List: The primary encounter diagnosis was Spinal stenosis, lumbar region, with neurogenic claudication. Diagnoses of Lumbar facet arthropathy, Chronic radicular lumbar pain, History of lumbar fusion, and Lumbar degenerative disc disease were also pertinent to this visit.  Visit Diagnosis (New problems to examiner): 1. Spinal stenosis, lumbar region, with neurogenic claudication   2. Lumbar facet arthropathy   3. Chronic radicular lumbar pain   4. History of lumbar fusion   5. Lumbar degenerative disc disease     Patient is a pleasant 73 year old male with a history of L3-L5 XLIF, L5-S1 TLIF, L3-S1 PLIF, on 11/20/2017 who presents with a chief complaint of axial low back as well as bilateral buttock pain and occasional superior thigh and posterior lateral thigh pain.  Patient's pain is been getting worse over time.  He states that he has difficulty walking.  He walks bent over.  He states that he can only walk a block or 2 without having weakness and pain in his legs before having to stop.  He states that his pain does improve when he goes from standing to sitting.  States that his leg weakness and pain is equal in intensity.  Patient has had difficulty with falls in the past.  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  This was a difficult postoperative course complicated by loss of motor strength in both legs, left greater than right.  Over the course of 1 month, with the help of rehab, patient's strength has improved.  He does wear a left knee brace for support.  He lives independently at home without any help.  Patient has tried bilateral L2-L3 facet  joint injections at [facility] which he states helped for a week. Patient continues to have persistent axial low back pain as well as lower extremity leg weakness.  He is being referred here from neurosurgery for consideration of spinal cord stimulation.  He may also be a candidate for extension of his fusion with lateral lumbar interbody fusion at L2-L3 given adjacent segment disease.  Patient is a discussion with the patient regarding his treatment options.  We are fairly limited.  We did have an extensive discussion about spinal cord stimulation.  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  Spinal cord stimulation is usually better for appendicular pain than axial pain and patient primarily describes lower axial pain as the primary pain location.  Furthermore the patient's pain presentation seems consistent with lumbar spinal stenosis with neurogenic claudication.  Spinal cord stimulation is usually not very effective for this.  Furthermore, the patient could be demonstrating instability at L2-L3 from his L2-L3 decompression given conus medullary syndrome.  SCS may not be the best mode of therapy for his current pain presentation.  We will continue to have a discussion about spinal cord stimulation but at this point we will hold off.  I did discuss considering a trial of caudal epidural injection to see if it helps out with his pain symptoms and will also make some medication adjustments as below.  Plan of Care (Initial workup plan)   Procedure Orders     Caudal Epidural Injection Pharmacotherapy (current): Medications ordered:  Meds ordered this encounter  Medications  . methocarbamol (ROBAXIN)  750 MG tablet    Sig: Take 1 tablet (750 mg total) by mouth every 8 (eight) hours as needed for muscle spasms.    Dispense:  90 tablet    Refill:  0    Do not place this  medication, or any other prescription from our practice, on "Automatic Refill". Patient may have prescription filled one day early if pharmacy is closed on scheduled refill date.  . diclofenac (VOLTAREN) 50 MG EC tablet    Sig: Take 1 tablet (50 mg total) by mouth 2 (two) times daily after a meal.    Dispense:  60 tablet    Refill:  1   Medications administered during this visit: Goro L. Cromie had no medications administered during this visit.   Pharmacological management options:  Opioid Analgesics: The patient was informed that there is no guarantee that he would be a candidate for opioid analgesics. The decision will be made following CDC guidelines. This decision will be based on the results of diagnostic studies, as well as Mr. Crotty risk profile.   Membrane stabilizer: To be determined at a later time  Muscle relaxant: Robaxin  NSAID: Diclofenac  Other analgesic(s): To be determined at a later time   Interventional management options: Mr. Begay was informed that there is no guarantee that he would be a candidate for interventional therapies. The decision will be based on the results of diagnostic studies, as well as Mr. Cipollone risk profile.  Procedure(s) under consideration:  Caudal ESI Thoracolumbar spinal cord stimulation   Provider-requested follow-up: Return in about 1 week (around 07/25/2019) for Procedure CAUDAL.  Future Appointments  Date Time Provider [place]  07/22/2019 11:00 AM Gillis Santa, MD [facility] None  11/19/2019 10:00 AM Hollice Espy, MD [facility] None    Primary Care Physician: Derinda Late, MD Location: [facility] Note by: Gillis Santa, MD Date: 07/18/2019; Time: 9:21 AM  Note: This dictation was prepared with Dragon dictation. Any transcriptional errors that may result from this process are unintentional.

## 2019-07-18 ENCOUNTER — Other Ambulatory Visit: Payer: Self-pay

## 2019-07-18 ENCOUNTER — Ambulatory Visit
Payer: Medicare Other | Attending: Student in an Organized Health Care Education/Training Program | Admitting: Student in an Organized Health Care Education/Training Program

## 2019-07-18 ENCOUNTER — Encounter: Payer: Self-pay | Admitting: Student in an Organized Health Care Education/Training Program

## 2019-07-18 VITALS — BP 145/80 | HR 67 | Temp 98.3°F | Resp 18 | Ht 67.0 in | Wt 182.0 lb

## 2019-07-18 DIAGNOSIS — M5136 Other intervertebral disc degeneration, lumbar region: Secondary | ICD-10-CM | POA: Diagnosis present

## 2019-07-18 DIAGNOSIS — M48062 Spinal stenosis, lumbar region with neurogenic claudication: Secondary | ICD-10-CM | POA: Diagnosis not present

## 2019-07-18 DIAGNOSIS — Z981 Arthrodesis status: Secondary | ICD-10-CM | POA: Diagnosis present

## 2019-07-18 DIAGNOSIS — M47816 Spondylosis without myelopathy or radiculopathy, lumbar region: Secondary | ICD-10-CM | POA: Diagnosis present

## 2019-07-18 DIAGNOSIS — G8929 Other chronic pain: Secondary | ICD-10-CM | POA: Insufficient documentation

## 2019-07-18 DIAGNOSIS — M5416 Radiculopathy, lumbar region: Secondary | ICD-10-CM | POA: Insufficient documentation

## 2019-07-18 MED ORDER — DICLOFENAC SODIUM 50 MG PO TBEC: 50.0000 mg | DELAYED_RELEASE_TABLET | Freq: Two times a day (BID) | ORAL | 1 refills | Status: DC

## 2019-07-18 MED ORDER — METHOCARBAMOL 750 MG PO TABS: 750.0000 mg | ORAL_TABLET | Freq: Three times a day (TID) | ORAL | 0 refills | Status: DC | PRN

## 2019-07-18 NOTE — Patient Instructions (Addendum)
Prescriptions for Diclofenac and Methocarbamol were sent to your pharmacy.  ____________________________________________________________________________________________  ____________________________________________________________________________________________  Preparing for your procedure (without sedation)  Procedure appointments are limited to planned procedures: . No Prescription Refills. . No disability issues will be discussed. . No medication changes will be discussed.  Instructions: . Oral Intake: Do not eat or drink anything for at least 3 hours prior to your procedure. . Transportation: Unless otherwise stated by your physician, you may drive yourself after the procedure. . Blood Pressure Medicine: Take your blood pressure medicine with a sip of water the morning of the procedure. . Blood thinners: Notify our staff if you are taking any blood thinners. Depending on which one you take, there will be specific instructions on how and when to stop it. . Diabetics on insulin: Notify the staff so that you can be scheduled 1st case in the morning. If your diabetes requires high dose insulin, take only  of your normal insulin dose the morning of the procedure and notify the staff that you have done so. . Preventing infections: Shower with an antibacterial soap the morning of your procedure.  . Build-up your immune system: Take 1000 mg of Vitamin C with every meal (3 times a day) the day prior to your procedure. Marland Kitchen Antibiotics: Inform the staff if you have a condition or reason that requires you to take antibiotics before dental procedures. . Pregnancy: If you are pregnant, call and cancel the procedure. . Sickness: If you have a cold, fever, or any active infections, call and cancel the procedure. . Arrival: You must be in the facility at least 30 minutes prior to your scheduled procedure. . Children: Do not bring any children with you. . Dress appropriately: Bring dark clothing that  you would not mind if they get stained. . Valuables: Do not bring any jewelry or valuables.  Reasons to call and reschedule or cancel your procedure: (Following these recommendations will minimize the risk of a serious complication.) . Surgeries: Avoid having procedures within 2 weeks of any surgery. (Avoid for 2 weeks before or after any surgery). . Flu Shots: Avoid having procedures within 2 weeks of a flu shots or . (Avoid for 2 weeks before or after immunizations). . Barium: Avoid having a procedure within 7-10 days after having had a radiological study involving the use of radiological contrast. (Myelograms, Barium swallow or enema study). . Heart attacks: Avoid any elective procedures or surgeries for the initial 6 months after a "Myocardial Infarction" (Heart Attack). . Blood thinners: It is imperative that you stop these medications before procedures. Let us know if you if you take any blood thinner.  . Infection: Avoid procedures during or within two weeks of an infection (including chest colds or gastrointestinal problems). Symptoms associated with infections include: Localized redness, fever, chills, night sweats or profuse sweating, burning sensation when voiding, cough, congestion, stuffiness, runny nose, sore throat, diarrhea, nausea, vomiting, cold or Flu symptoms, recent or current infections. It is specially important if the infection is over the area that we intend to treat. Marland Kitchen Heart and lung problems: Symptoms that may suggest an active cardiopulmonary problem include: cough, chest pain, breathing difficulties or shortness of breath, dizziness, ankle swelling, uncontrolled high or unusually low blood pressure, and/or palpitations. If you are experiencing any of these symptoms, cancel your procedure and contact your primary care physician for an evaluation.  Remember:  Regular Business hours are:  Monday to Thursday 8:00 AM to 4:00 PM  Provider's Schedule: Milinda Pointer, MD:  Procedure days: Tuesday and Thursday 7:30 AM to 4:00 PM  Gillis Santa, MD:  Procedure days: Monday and Wednesday 7:30 AM to 4:00 PM ____________________________________________________________________________________________

## 2019-07-18 NOTE — Progress Notes (Signed)
Safety precautions to be maintained throughout the outpatient stay will include: orient to surroundings, keep bed in low position, maintain call bell within reach at all times, provide assistance with transfer out of bed and ambulation.  

## 2019-07-22 ENCOUNTER — Ambulatory Visit
Admission: RE | Admit: 2019-07-22 | Discharge: 2019-07-22 | Disposition: A | Discharge status: Home / Self Care | Payer: Medicare Other | Source: Ambulatory Visit | Attending: Student in an Organized Health Care Education/Training Program | Admitting: Student in an Organized Health Care Education/Training Program

## 2019-07-22 ENCOUNTER — Encounter: Payer: Self-pay | Admitting: Student in an Organized Health Care Education/Training Program

## 2019-07-22 ENCOUNTER — Ambulatory Visit (HOSPITAL_BASED_OUTPATIENT_CLINIC_OR_DEPARTMENT_OTHER): Payer: Medicare Other | Admitting: Student in an Organized Health Care Education/Training Program

## 2019-07-22 ENCOUNTER — Other Ambulatory Visit: Payer: Self-pay

## 2019-07-22 VITALS — BP 151/78 | HR 72 | Temp 98.1°F | Resp 19 | Ht 67.0 in | Wt 182.0 lb

## 2019-07-22 DIAGNOSIS — G8929 Other chronic pain: Secondary | ICD-10-CM | POA: Insufficient documentation

## 2019-07-22 DIAGNOSIS — M5416 Radiculopathy, lumbar region: Secondary | ICD-10-CM | POA: Diagnosis present

## 2019-07-22 MED ORDER — DEXAMETHASONE SODIUM PHOSPHATE 10 MG/ML IJ SOLN
10.0000 mg | Freq: Once | INTRAMUSCULAR | Status: AC
  Administered 2019-07-22: 10 mg

## 2019-07-22 MED ORDER — SODIUM CHLORIDE (PF) 0.9 % IJ SOLN
INTRAMUSCULAR | Status: AC
  Filled 2019-07-22: qty 10

## 2019-07-22 MED ORDER — IOHEXOL 180 MG/ML  SOLN
10.0000 mL | Freq: Once | INTRAMUSCULAR | Status: AC
  Administered 2019-07-22: 12:00:00 10 mL via EPIDURAL

## 2019-07-22 MED ORDER — LIDOCAINE HCL 2 % IJ SOLN
INTRAMUSCULAR | Status: AC
  Filled 2019-07-22: qty 20

## 2019-07-22 MED ORDER — SODIUM CHLORIDE 0.9% FLUSH
2.0000 mL | Freq: Once | INTRAVENOUS | Status: AC
  Administered 2019-07-22: 10 mL

## 2019-07-22 MED ORDER — DEXAMETHASONE SODIUM PHOSPHATE 10 MG/ML IJ SOLN
INTRAMUSCULAR | Status: AC
  Filled 2019-07-22: qty 1

## 2019-07-22 MED ORDER — LIDOCAINE HCL 2 % IJ SOLN
20.0000 mL | Freq: Once | INTRAMUSCULAR | Status: AC
  Administered 2019-07-22: 12:00:00 400 mg

## 2019-07-22 MED ORDER — ROPIVACAINE HCL 2 MG/ML IJ SOLN
2.0000 mL | Freq: Once | INTRAMUSCULAR | Status: AC
  Administered 2019-07-22: 10 mL via EPIDURAL

## 2019-07-22 MED ORDER — ROPIVACAINE HCL 2 MG/ML IJ SOLN
INTRAMUSCULAR | Status: AC
  Filled 2019-07-22: qty 10

## 2019-07-22 NOTE — Progress Notes (Signed)
Safety precautions to be maintained throughout the outpatient stay will include: orient to surroundings, keep bed in low position, maintain call bell within reach at all times, provide assistance with transfer out of bed and ambulation.  

## 2019-07-22 NOTE — Patient Instructions (Signed)

## 2019-07-22 NOTE — Progress Notes (Signed)
Patient's Name: Trevor [name]  MRN: [id number]  Referring Provider: Derinda Late, MD  DOB: 05/22/1946  PCP: Derinda Late, MD  DOS: 07/22/2019  Note by: Gillis Santa, MD  Service setting: Ambulatory outpatient  Specialty: Interventional Pain Management  Patient type: Established  Location: [facility] (AMB) Pain Management Facility  Visit type: Interventional Procedure   Primary Reason for Visit: Interventional Pain Management Treatment. CC: Back Pain (lower bilateral )  Procedure:          Anesthesia, Analgesia, Anxiolysis:  Type: Diagnostic Epidural Steroid Injection #1  Region: Caudal Level: Sacrococcygeal   Laterality: Midline       Type: Local Anesthesia  Local Anesthetic: Lidocaine 1-2%  Position: Prone   Indications: 1. Chronic radicular lumbar pain    Pain Score: Pre-procedure: 3/10 Post-procedure: 0-No pain/10   Pre-op Assessment:  Mr. Hove is a 73 y.o. (year old), male patient, seen today for interventional treatment. He  has a past surgical history that includes Tonsillectomy; Septoplasty; Colonoscopy, esophagogastroduodenoscopy (egd) and esophageal dilation; Flexible sigmoidoscopy; Colonoscopy with propofol (N/A, 07/31/2015); Bladder surgery; and Lumbar laminectomy/decompression microdiscectomy (N/A, 06/20/2018). Mr. Terrones has a current medication list which includes the following prescription(s): acetaminophen, aspirin, diclofenac, lisinopril-hydrochlorothiazide, methocarbamol, multivitamin with minerals, omega-3 fatty acids, omeprazole, OVER THE COUNTER MEDICATION, senna-docusate, simvastatin, sucralfate, tamsulosin, trazodone, and naproxen sodium. His primarily concern today is the Back Pain (lower bilateral )  Initial Vital Signs:  Pulse/HCG Rate: 72ECG Heart Rate: 69 Temp: 98.1 F (36.7 C) Resp: 16 BP: (!) 153/90 SpO2: 99 %  BMI: Estimated body mass index is 28.51 kg/m as calculated from the following:   Height as of this encounter: 5\' 7"  (1.702 m).   Weight as of  this encounter: 182 lb (82.6 kg).  Risk Assessment: Allergies: Reviewed. He is allergic to ceftriaxone.  Allergy Precautions: None required Coagulopathies: Reviewed. None identified.  Blood-thinner therapy: None at this time Active Infection(s): Reviewed. None identified. Mr. Kem is afebrile  Site Confirmation: Mr. Presley was asked to confirm the procedure and laterality before marking the site Procedure checklist: Completed Consent: Before the procedure and under the influence of no sedative(s), amnesic(s), or anxiolytics, the patient was informed of the treatment options, risks and possible complications. To fulfill our ethical and legal obligations, as recommended by the American Medical Association's Code of Ethics, I have informed the patient of my clinical impression; the nature and purpose of the treatment or procedure; the risks, benefits, and possible complications of the intervention; the alternatives, including doing nothing; the risk(s) and benefit(s) of the alternative treatment(s) or procedure(s); and the risk(s) and benefit(s) of doing nothing. The patient was provided information about the general risks and possible complications associated with the procedure. These may include, but are not limited to: failure to achieve desired goals, infection, bleeding, organ or nerve damage, allergic reactions, paralysis, and death. In addition, the patient was informed of those risks and complications associated to Spine-related procedures, such as failure to decrease pain; infection (i.e.: Meningitis, epidural or intraspinal abscess); bleeding (i.e.: epidural hematoma, subarachnoid hemorrhage, or any other type of intraspinal or peri-dural bleeding); organ or nerve damage (i.e.: Any type of peripheral nerve, nerve root, or spinal cord injury) with subsequent damage to sensory, motor, and/or autonomic systems, resulting in permanent pain, numbness, and/or weakness of one or several areas of the body;  allergic reactions; (i.e.: anaphylactic reaction); and/or death. Furthermore, the patient was informed of those risks and complications associated with the medications. These include, but are not limited to: allergic reactions (  i.e.: anaphylactic or anaphylactoid reaction(s)); adrenal axis suppression; blood sugar elevation that in diabetics may result in ketoacidosis or comma; water retention that in patients with history of congestive heart failure may result in shortness of breath, pulmonary edema, and decompensation with resultant heart failure; weight gain; swelling or edema; medication-induced neural toxicity; particulate matter embolism and blood vessel occlusion with resultant organ, and/or nervous system infarction; and/or aseptic necrosis of one or more joints. Finally, the patient was informed that Medicine is not an exact science; therefore, there is also the possibility of unforeseen or unpredictable risks and/or possible complications that may result in a catastrophic outcome. The patient indicated having understood very clearly. We have given the patient no guarantees and we have made no promises. Enough time was given to the patient to ask questions, all of which were answered to the patient's satisfaction. Mr. Chabot has indicated that he wanted to continue with the procedure. Attestation: I, the ordering provider, attest that I have discussed with the patient the benefits, risks, side-effects, alternatives, likelihood of achieving goals, and potential problems during recovery for the procedure that I have provided informed consent. Date  Time: 07/22/2019 10:50 AM  Pre-Procedure Preparation:  Monitoring: As per clinic protocol. Respiration, ETCO2, SpO2, BP, heart rate and rhythm monitor placed and checked for adequate function Safety Precautions: Patient was assessed for positional comfort and pressure points before starting the procedure. Time-out: I initiated and conducted the "Time-out"  before starting the procedure, as per protocol. The patient was asked to participate by confirming the accuracy of the "Time Out" information. Verification of the correct person, site, and procedure were performed and confirmed by me, the nursing staff, and the patient. "Time-out" conducted as per Joint Commission's Universal Protocol (UP.01.01.01). Time: 1127  Description of Procedure:          Target Area: Caudal Epidural Canal. Approach: Midline approach. Area Prepped: Entire Posterior Sacrococcygeal Region Prepping solution: DuraPrep (Iodine Povacrylex [0.7% available iodine] and Isopropyl Alcohol, 74% w/w) Safety Precautions: Aspiration looking for blood return was conducted prior to all injections. At no point did we inject any substances, as a needle was being advanced. No attempts were made at seeking any paresthesias. Safe injection practices and needle disposal techniques used. Medications properly checked for expiration dates. SDV (single dose vial) medications used. Description of the Procedure: Protocol guidelines were followed. The patient was placed in position over the fluoroscopy table. The target area was identified and the area prepped in the usual manner. Skin & deeper tissues infiltrated with local anesthetic. Appropriate amount of time allowed to pass for local anesthetics to take effect. The procedure needles were then advanced to the target area. Proper needle placement secured. Negative aspiration confirmed. Solution injected in intermittent fashion, asking for systemic symptoms every 0.5cc of injectate. The needles were then removed and the area cleansed, making sure to leave some of the prepping solution back to take advantage of its long term bactericidal properties. Vitals:   07/22/19 1052 07/22/19 1129 07/22/19 1133 07/22/19 1134  BP: (!) 153/90 (!) 149/67 (!) 149/74 (!) 151/78  Pulse: 72     Resp: 16 18 17 19   Temp: 98.1 F (36.7 C)     TempSrc: Oral     SpO2: 99% 98%  99% 99%  Weight: 182 lb (82.6 kg)     Height: 5\' 7"  (1.702 m)       Start Time: 1127 hrs. End Time: 1134 hrs. Materials:  Needle(s) Type: Epidural needle Gauge: 17G Length: 3.5-in  Medication(s): Please see orders for medications and dosing details. 7 cc solution made of 4 cc of preservative-free saline, 2 cc of 0.2% ropivacaine, 1 cc of Decadron 10 mg/cc. Imaging Guidance (Spinal):          Type of Imaging Technique: Fluoroscopy Guidance (Spinal) Indication(s): Assistance in needle guidance and placement for procedures requiring needle placement in or near specific anatomical locations not easily accessible without such assistance. Exposure Time: Please see nurses notes. Contrast: Before injecting any contrast, we confirmed that the patient did not have an allergy to iodine, shellfish, or radiological contrast. Once satisfactory needle placement was completed at the desired level, radiological contrast was injected. Contrast injected under live fluoroscopy. No contrast complications. See chart for type and volume of contrast used. Fluoroscopic Guidance: I was personally present during the use of fluoroscopy. "Tunnel Vision Technique" used to obtain the best possible view of the target area. Parallax error corrected before commencing the procedure. "Direction-depth-direction" technique used to introduce the needle under continuous pulsed fluoroscopy. Once target was reached, antero-posterior, oblique, and lateral fluoroscopic projection used confirm needle placement in all planes. Images permanently stored in EMR. Interpretation: I personally interpreted the imaging intraoperatively. Adequate needle placement confirmed in multiple planes. Appropriate spread of contrast into desired area was observed. No evidence of afferent or efferent intravascular uptake. No intrathecal or subarachnoid spread observed. Permanent images saved into the patient's record.  Antibiotic Prophylaxis:   Anti-infectives  (From admission, onward)   None     Indication(s): None identified  Post-operative Assessment:  Post-procedure Vital Signs:  Pulse/HCG Rate: 7268 Temp: 98.1 F (36.7 C) Resp: 19 BP: (!) 151/78 SpO2: 99 %  EBL: None  Complications: No immediate post-treatment complications observed by team, or reported by patient.  Note: The patient tolerated the entire procedure well. A repeat set of vitals were taken after the procedure and the patient was kept under observation following institutional policy, for this type of procedure. Post-procedural neurological assessment was performed, showing return to baseline, prior to discharge. The patient was provided with post-procedure discharge instructions, including a section on how to identify potential problems. Should any problems arise concerning this procedure, the patient was given instructions to immediately contact us, at any time, without hesitation. In any case, we plan to contact the patient by telephone for a follow-up status report regarding this interventional procedure.  Comments:  No additional relevant information.  Plan of Care  Orders:  Orders Placed This Encounter  Procedures  . DG PAIN CLIN[facility] C-ARM 1-60 MIN NO REPORT    Intraoperative interpretation by procedural physician at [place].    Standing Status:   Standing    Number of Occurrences:   1    Order Specific Question:   Reason for exam:    Answer:   Assistance in needle guidance and placement for procedures requiring needle placement in or near specific anatomical locations not easily accessible without such assistance.   Medications ordered for procedure: Meds ordered this encounter  Medications  . iohexol (OMNIPAQUE) 180 MG/ML injection 10 mL    Must be Myelogram-compatible. If not available, you may substitute with a water-soluble, non-ionic, hypoallergenic, myelogram-compatible radiological contrast medium.  Marland Kitchen lidocaine (XYLOCAINE) 2 % (with pres)  injection 400 mg  . ropivacaine (PF) 2 mg/mL (0.2%) (NAROPIN) injection 2 mL  . dexamethasone (DECADRON) injection 10 mg  . sodium chloride flush (NS) 0.9 % injection 2 mL   Medications administered: We administered iohexol, lidocaine, ropivacaine (PF) 2 mg/mL (0.2%), dexamethasone,  and sodium chloride flush.  See the medical record for exact dosing, route, and time of administration.  Follow-up plan:   Return in about 3 weeks (around 08/12/2019) for Post Procedure Evaluation, virtual.       Status post caudal ESI 07/22/2019 Recent Visits Date Type Provider Dept  07/18/19 Office Visit Gillis Santa, MD [facility]  Showing recent visits within past 90 days and meeting all other requirements   Today's Visits Date Type Provider Dept  07/22/19 Procedure visit Gillis Santa, MD [facility]  Showing today's visits and meeting all other requirements   Future Appointments Date Type Provider Dept  08/13/19 Appointment Gillis Santa, MD [facility]  Showing future appointments within next 90 days and meeting all other requirements   Disposition: Discharge home  Discharge Date & Time: 07/22/2019;   hrs.   Primary Care Physician: Derinda Late, MD Location: [facility] Note by: Gillis Santa, MD Date: 07/22/2019; Time: 1:01 PM  Disclaimer:  Medicine is not an exact science. The only guarantee in medicine is that nothing is guaranteed. It is important to note that the decision to proceed with this intervention was based on the information collected from the patient. The Data and conclusions were drawn from the patient's questionnaire, the interview, and the physical examination. Because the information was provided in large part by the patient, it cannot be guaranteed that it has not been purposely or unconsciously manipulated. Every effort has been made to obtain as much relevant data as possible for this evaluation. It is important to  note that the conclusions that lead to this procedure are derived in large part from the available data. Always take into account that the treatment will also be dependent on availability of resources and existing treatment guidelines, considered by other Pain Management Practitioners as being common knowledge and practice, at the time of the intervention. For Medico-Legal purposes, it is also important to point out that variation in procedural techniques and pharmacological choices are the acceptable norm. The indications, contraindications, technique, and results of the above procedure should only be interpreted and judged by a Board-Certified Interventional Pain Specialist with extensive familiarity and expertise in the same exact procedure and technique.

## 2019-07-23 ENCOUNTER — Telehealth: Payer: Self-pay | Admitting: *Deleted

## 2019-07-23 NOTE — Telephone Encounter (Signed)
Attempted to call for post procedure follow-up. Message left. 

## 2019-08-12 ENCOUNTER — Encounter: Payer: Self-pay | Admitting: Student in an Organized Health Care Education/Training Program

## 2019-08-13 ENCOUNTER — Other Ambulatory Visit: Payer: Self-pay

## 2019-08-13 ENCOUNTER — Ambulatory Visit
Payer: Medicare Other | Attending: Student in an Organized Health Care Education/Training Program | Admitting: Student in an Organized Health Care Education/Training Program

## 2019-08-13 ENCOUNTER — Encounter: Payer: Self-pay | Admitting: Student in an Organized Health Care Education/Training Program

## 2019-08-13 DIAGNOSIS — IMO0002 Reserved for concepts with insufficient information to code with codable children: Secondary | ICD-10-CM

## 2019-08-13 DIAGNOSIS — Z981 Arthrodesis status: Secondary | ICD-10-CM | POA: Diagnosis not present

## 2019-08-13 DIAGNOSIS — M48062 Spinal stenosis, lumbar region with neurogenic claudication: Secondary | ICD-10-CM | POA: Diagnosis not present

## 2019-08-13 DIAGNOSIS — M4800 Spinal stenosis, site unspecified: Secondary | ICD-10-CM

## 2019-08-13 NOTE — Progress Notes (Signed)
Pain Management Virtual Encounter Note - Virtual Visit via Telephone Telehealth (real-time audio visits between healthcare provider and patient).   Patient's Phone No. & Preferred Pharmacy:  [phone] (home); [phone] (mobile); (Preferred) [phone] [email or web address]  [place], [place] - [address] [place] [address] Phone: [phone] Fax: [phone]  [facility] [place], [place] [address] [place] [address] Phone: [phone] Fax: [phone]    Pre-screening note:  Our staff contacted Trevor [name] and offered him an "in person", "face-to-face" appointment versus a telephone encounter. He indicated preferring the telephone encounter, at this time.   Reason for Virtual Visit: COVID-19*  Social distancing based on CDC and AMA recommendations.   I contacted Trevor [name] on 08/13/2019 via telephone.      I clearly identified myself as Trevor Santa, MD. I verified that I was speaking with the correct person using two identifiers (Name: Trevor [name], and date of birth: 08/15/1946).  Advanced Informed Consent I sought verbal advanced consent from Trevor [name] for virtual visit interactions. I informed Trevor [name] of possible security and privacy concerns, risks, and limitations associated with providing "not-in-person" medical evaluation and management services. I also informed Trevor [name] of the availability of "in-person" appointments. Finally, I informed him that there would be a charge for the virtual visit and that he could be  personally, fully or partially, financially responsible for it. Trevor [name] expressed understanding and agreed to proceed.   Historic Elements   Trevor [name] is a 73 y.o. year old, male patient evaluated today after his last encounter by our practice on 07/23/2019. Trevor [name]  has a past medical history of Acne, Allergic rhinitis, Anemia, Anxiety, BPH (benign prostatic  hypertrophy) with urinary obstruction, Cancer (HCC), Carpal tunnel syndrome, Cervicalgia, Degenerative disc disease, cervical, Depression, Gastritis, GERD (gastroesophageal reflux disease), Hemorrhoids, History of malignant neoplasm of bladder, Hyperlipidemia, Hypertension, Hypospadias, Low back pain, Mitral regurgitation, Pre-diabetes, Sleep apnea, and Urinary frequency. He also  has a past surgical history that includes Tonsillectomy; Septoplasty; Colonoscopy, esophagogastroduodenoscopy (egd) and esophageal dilation; Flexible sigmoidoscopy; Colonoscopy with propofol (N/A, 07/31/2015); Bladder surgery; and Lumbar laminectomy/decompression microdiscectomy (N/A, 06/20/2018). Trevor [name] has a current medication list which includes the following prescription(s): acetaminophen, aspirin, diclofenac, lisinopril-hydrochlorothiazide, methocarbamol, multivitamin with minerals, omega-3 fatty acids, omeprazole, OVER THE COUNTER MEDICATION, senna-docusate, simvastatin, sucralfate, tamsulosin, trazodone, and naproxen sodium. He  reports that he has never smoked. He has never used smokeless tobacco. He reports that he does not drink alcohol or use drugs. Trevor [name] is allergic to ceftriaxone.   HPI  Today, he is being contacted for a post-procedure assessment.  Unfortunately, the patient did not sustain any significant pain relief after his caudal ESI performed on 07/22/2019.  Patient has seen Dr. Cari [name] with neurosurgery and he is scheduled for XLIF L2-3, PSF L2-S1; lateral lumbar interbody fusion at L2-3.  This is going to be an extensive lumbar [name] surgery.  I did briefly discuss perioperative pain management with the patient.  Patient will follow-up PRN.  Laboratory Chemistry Profile (12 mo)  Renal: No results found for requested labs within last 8760 hours.  Lab Results  Component Value Date   GFRAA >60 07/12/2018   GFRNONAA >60 07/12/2018   Hepatic: No results found for requested labs within last 8760  hours. Lab Results  Component Value Date   AST 26 06/23/2018   ALT 23  06/23/2018   Other: No results found for requested labs within last 8760 hours. Note: Above Lab results reviewed.  Imaging  Last 90 days:  Ct Lumbar [name] Wo Contrast  Result Date: 05/30/2019 CLINICAL DATA:  Chronic back and left leg pain. EXAM: CT LUMBAR [name] WITHOUT CONTRAST TECHNIQUE: Multidetector CT imaging of the lumbar [name] was performed without intravenous contrast administration. Multiplanar CT image reconstructions were also generated. COMPARISON:  MRI lumbar [name] 07/10/2018 FINDINGS: Segmentation: There are five lumbar type vertebral bodies. The last full intervertebral disc space is labeled L5-S1. This correlates with the prior MRI. Alignment: Normal Vertebrae: No acute vertebral body fracture or worrisome bone lesion. Advanced degenerative lumbar spondylosis with lower thoracic and upper lumbar disc disease. Extensive fusion hardware noted from L3-S1 with pedicle screws, posterior rods and interbody fusion devices. Paraspinal and other soft tissues: No significant paraspinal or retroperitoneal findings. Severe atherosclerotic calcifications involving the aorta and iliac arteries without aneurysm. Disc levels: L1-2: Bulging annulus and moderate facet disease but no significant spinal or foraminal stenosis. L2-3: Severe disc disease and facet disease with vacuum disc phenomenon. Diffuse bulging degenerated annulus with moderate spinal and bilateral lateral recess stenosis. A right-sided laminectomy defect is noted. L3-4: Posterior and interbody fusion hardware. I do not see any significant interbody fusion changes. No gross spinal or foraminal stenosis. L4-5: Posterior and interbody fusion hardware. There are areas of solid interbody fusion noted. Severe facet disease and laminectomy defect. Moderately severe appearing spinal and bilateral lateral recess stenosis no significant foraminal stenosis. This does not appear to  be as significant on the MRI from today. L5-S1: Posterior and interbody fusion changes with wide decompressive laminectomy. Areas of solid interbody fusion are demonstrated. No spinal stenosis. Moderate left foraminal stenosis. IMPRESSION: 1. Postoperative changes with L3-S1 fusion hardware. 2. Moderate spinal and bilateral lateral recess stenosis at L2-3. 3. No solid interbody fusion changes at L3-4. 4. Moderately severe spinal and bilateral lateral recess stenosis at L4-5. This does not appear to be as significant on today's MRI. 5. Moderate left foraminal stenosis at L5-S1. 6. No acute bony findings. Electronically Signed   By: Marijo Sanes M.D.   On: 05/30/2019 08:44   Mr Lumbar [name] Wo Contrast  Result Date: 05/30/2019 CLINICAL DATA:  Persistent back and left EXAM: MRI LUMBAR [name] WITHOUT CONTRAST TECHNIQUE: Multiplanar, multisequence MR imaging of the lumbar [name] was performed. No intravenous contrast was administered. COMPARISON:  CT scan from today and prior MRI lumbar [name] 07/10/2018 FINDINGS: Segmentation: There are five lumbar type vertebral bodies. The last full intervertebral disc space is labeled L5-S1. This correlates with the prior MR examination. Alignment:  Normal Vertebrae: No acute bony findings or bone lesions. Artifact associated with the fusion hardware from L3-S1. Marked endplate reactive changes are noted at L2-3 and new since the prior MRI. Conus medullaris and cauda equina: Conus extends to the bottom of L1 level. Conus and cauda equina appear normal. Paraspinal and other soft tissues: No significant findings. Disc levels: L1-2: No significant findings. Mild bulging annulus and moderate facet disease but no significant spinal or foraminal stenosis. L2-3: Remote laminectomy defect. There is a shallow central recurrent disc protrusion with mass effect on the ventral thecal sac. This in conjunction with facet disease contributes to mild spinal and moderate bilateral lateral recess  stenosis. No significant foraminal stenosis. Far left lateral disc osteophyte complex but no direct L2 nerve root involvement. L3-4: Posterior and interbody fusion changes. Persistent moderate spinal and bilateral lateral recess stenosis without significant  foraminal stenosis. These findings appear stable. L4-5: Posterior and interbody fusion changes with osteophytic ridging and facet disease contributing to moderate spinal and bilateral lateral recess stenosis and mild bilateral foraminal encroachment. These findings appear stable. L5-S1: Posterior and interbody fusion changes. No spinal lateral recess stenosis. There is a right-sided disc osteophyte complex with mild right foraminal stenosis. This appears stable. IMPRESSION: 1. Postoperative changes with fusion hardware from L3-S1 without obvious complicating features. 2. Stable appearing spinal and bilateral lateral recess stenosis at L3-4 and L4-5. 3. Progressive degenerative disc disease at L2-3 with marked endplate reactive changes. There is a central recurrent disc protrusion with mass effect on the thecal sac contributing to mild spinal and moderate bilateral lateral recess stenosis. Electronically Signed   By: Marijo Sanes M.D.   On: 05/30/2019 08:53   [facility] C-arm 1-60 Min No Report  Result Date: 07/22/2019 Fluoro was used, but no Radiologist interpretation will be provided. Please refer to "NOTES" tab for provider progress note.   Assessment  The primary encounter diagnosis was Spinal stenosis, lumbar region, with neurogenic claudication. Diagnoses of History of lumbar fusion and Adjacent segment disease with spinal stenosis were also pertinent to this visit.  Plan of Care  I am having Trevor [name] maintain his simvastatin, multivitamin with minerals, sucralfate, OVER THE COUNTER MEDICATION, acetaminophen, traZODone, senna-docusate, tamsulosin, naproxen sodium, omeprazole, ASPIRIN 81 PO, Omega-3 Fatty Acids (OMEGA 3 PO),  lisinopril-hydrochlorothiazide, methocarbamol, and diclofenac.  Plan for lumbar [name] surgery as above with Dr. Cari [name] for associated adjacent segment disease and lumbar spinal stenosis with neurogenic claudication.  Follow-up PRN.  Patient advised to call back or seek an in-person evaluation if the symptoms or condition worsens.  Total duration of non-face-to-face encounter: 1minutes.  Note by: Trevor Santa, MD Date: 08/13/2019; Time: 12:55 PM  Note: This dictation was prepared with Dragon dictation. Any transcriptional errors that may result from this process are unintentional.  Disclaimer:  * Given the special circumstances of the COVID-19 pandemic, the federal government has announced that the Office for Civil Rights (OCR) will exercise its enforcement discretion and will not impose penalties on physicians using telehealth in the event of noncompliance with regulatory requirements under the [place] (HIPAA) in connection with the good faith provision of telehealth during the XX123456 national public health emergency. ([place])

## 2019-08-20 ENCOUNTER — Other Ambulatory Visit: Payer: Self-pay | Admitting: Neurosurgery

## 2019-09-03 ENCOUNTER — Other Ambulatory Visit: Admission: RE | Admit: 2019-09-03 | Payer: Medicare Other | Source: Ambulatory Visit

## 2019-09-05 ENCOUNTER — Encounter
Admission: RE | Admit: 2019-09-05 | Discharge: 2019-09-05 | Disposition: A | Discharge status: Home / Self Care | Payer: Medicare Other | Source: Ambulatory Visit | Attending: Neurosurgery | Admitting: Neurosurgery

## 2019-09-05 ENCOUNTER — Other Ambulatory Visit: Payer: Self-pay

## 2019-09-05 DIAGNOSIS — M48062 Spinal stenosis, lumbar region with neurogenic claudication: Secondary | ICD-10-CM | POA: Insufficient documentation

## 2019-09-05 DIAGNOSIS — M40209 Unspecified kyphosis, site unspecified: Secondary | ICD-10-CM | POA: Diagnosis not present

## 2019-09-05 DIAGNOSIS — Z01812 Encounter for preprocedural laboratory examination: Secondary | ICD-10-CM | POA: Diagnosis not present

## 2019-09-05 LAB — BASIC METABOLIC PANEL
Anion gap: 10 (ref 5–15)
BUN: 22 mg/dL (ref 8–23)
CO2: 26 mmol/L (ref 22–32)
Calcium: 9.7 mg/dL (ref 8.9–10.3)
Chloride: 101 mmol/L (ref 98–111)
Creatinine, Ser: 0.95 mg/dL (ref 0.61–1.24)
GFR calc Af Amer: >60 mL/min (ref >60)
GFR calc non Af Amer: >60 mL/min (ref >60)
Glucose, Bld: 107 mg/dL — ABNORMAL HIGH (ref 70–99)
Potassium: 3.9 mmol/L (ref 3.5–5.1)
Sodium: 137 mmol/L (ref 135–145)

## 2019-09-05 LAB — CBC
HCT: 38.9 % — ABNORMAL LOW (ref 39.0–52.0)
Hemoglobin: 12.8 g/dL — ABNORMAL LOW (ref 13.0–17.0)
MCH: 28.7 pg (ref 26.0–34.0)
MCHC: 32.9 g/dL (ref 30.0–36.0)
MCV: 87.2 fL (ref 80.0–100.0)
Platelets: 131 10*3/uL — ABNORMAL LOW (ref 150–400)
RBC: 4.46 MIL/uL (ref 4.22–5.81)
RDW: 12.6 % (ref 11.5–15.5)
WBC: 5.9 10*3/uL (ref 4.0–10.5)
nRBC: 0 % (ref 0.0–0.2)

## 2019-09-05 LAB — URINALYSIS, ROUTINE W REFLEX MICROSCOPIC
Bacteria, UA: NONE SEEN
Bilirubin Urine: NEGATIVE
Glucose, UA: NEGATIVE mg/dL
Hgb urine dipstick: NEGATIVE
Ketones, ur: NEGATIVE mg/dL
Nitrite: NEGATIVE
Protein, ur: NEGATIVE mg/dL
Specific Gravity, Urine: 1.025 (ref 1.005–1.030)
pH: 5 (ref 5.0–8.0)

## 2019-09-05 LAB — SURGICAL PCR SCREEN
MRSA, PCR: NEGATIVE
Staphylococcus aureus: NEGATIVE

## 2019-09-05 NOTE — Patient Instructions (Signed)
Your procedure is scheduled on: Wednesday 09/11/19.  Report to DAY SURGERY DEPARTMENT LOCATED ON 2ND FLOOR MEDICAL MALL ENTRANCE. To find out your arrival time please call [phone] between 1PM - 3PM on Tuesday 09/09/29.   Remember: Instructions that are not followed completely may result in serious medical risk, up to and including death, or upon the discretion of your surgeon and anesthesiologist your surgery may need to be rescheduled.      _X__ 1. Do not eat food after midnight the night before your procedure.                 No gum chewing or hard candies. You may drink clear liquids up to 2 hours                 before you are scheduled to arrive for your surgery- DO NOT drink clear                 liquids within 2 hours of the start of your surgery.                 Clear Liquids include:  water, apple juice without pulp, clear carbohydrate                 drink such as Clearfast or Gatorade, Black Coffee or Tea (Do not add                 milk or creamer to coffee or tea).    __X__2.  On the morning of surgery brush your teeth with toothpaste and water, you may rinse your mouth with mouthwash if you wish.  Do not swallow any toothpaste or mouthwash.      __X__3.  Notify your doctor if there is any change in your medical condition      (cold, fever, infections).       Do not wear jewelry, make-up, hairpins, clips or nail polish. Do not wear lotions, powders, or perfumes.  Do not shave 48 hours prior to surgery. Men may shave face and neck. Do not bring valuables to the hospital.      [facility] is not responsible for any belongings or valuables.   Contacts, dentures/partials or body piercings may not be worn into surgery. Bring a case for your contacts, glasses or hearing aids, a denture cup will be supplied.    Please read over the following fact sheets that you were given:   MRSA Information   __X__ Take these medicines the morning of surgery with A SIP OF  WATER:     1. acetaminophen (TYLENOL) 650 MG CR tablet  2. fluticasone (FLONASE) 50 MCG/ACT nasal spray  3. omeprazole (PRILOSEC) 10 MG capsule. Add a dose the night before your surgery too.  4. simvastatin (ZOCOR) 40 MG tablet  5. tamsulosin (FLOMAX) 0.4 MG CAPS capsule  6. docusate sodium (COLACE) 100 MG capsule     __X__ Use CHG Soap as directed    __X__ Stop Blood Thinners: Aspirin according to Dr. Henrene Hawking instructions.   __X__ Stop Anti-inflammatories 7 days before surgery such as Advil, Ibuprofen, Motrin, BC or Goodies Powder, Naprosyn, Naproxen, Aleve, Aspirin, Meloxicam. May take Tylenol if needed for pain or discomfort.     __X__ Don't start taking any new herbal supplements until after your procedure.   You have already stopped taking your: Omega-3 300 MG CAPS       : Apple Cid Vn-Grn Tea-Bit Or-Cr (APPLE CIDER VINEGAR PLUS PO)

## 2019-09-06 ENCOUNTER — Other Ambulatory Visit: Payer: Self-pay

## 2019-09-06 ENCOUNTER — Other Ambulatory Visit
Admission: RE | Admit: 2019-09-06 | Discharge: 2019-09-06 | Disposition: A | Discharge status: Home / Self Care | Payer: Medicare Other | Source: Ambulatory Visit | Attending: Neurosurgery | Admitting: Neurosurgery

## 2019-09-06 DIAGNOSIS — Z01812 Encounter for preprocedural laboratory examination: Secondary | ICD-10-CM | POA: Diagnosis present

## 2019-09-06 DIAGNOSIS — Z20828 Contact with and (suspected) exposure to other viral communicable diseases: Secondary | ICD-10-CM | POA: Insufficient documentation

## 2019-09-07 LAB — SARS CORONAVIRUS 2 (TAT 6-24 HRS): SARS Coronavirus 2: NEGATIVE

## 2019-09-11 ENCOUNTER — Encounter
Admission: RE | Disposition: A | Discharge status: Home Health | Payer: Self-pay | Source: Home / Self Care | Attending: Neurosurgery

## 2019-09-11 ENCOUNTER — Inpatient Hospital Stay
Admission: RE | Admit: 2019-09-11 | Discharge: 2019-09-16 | DRG: 458 | Disposition: A | Discharge status: Home Health | Payer: Medicare Other | Attending: Neurosurgery | Admitting: Neurosurgery

## 2019-09-11 ENCOUNTER — Other Ambulatory Visit: Payer: Self-pay

## 2019-09-11 ENCOUNTER — Inpatient Hospital Stay: Payer: Medicare Other | Admitting: Anesthesiology

## 2019-09-11 ENCOUNTER — Inpatient Hospital Stay: Payer: Medicare Other

## 2019-09-11 DIAGNOSIS — M4807 Spinal stenosis, lumbosacral region: Secondary | ICD-10-CM | POA: Diagnosis present

## 2019-09-11 DIAGNOSIS — F329 Major depressive disorder, single episode, unspecified: Secondary | ICD-10-CM | POA: Diagnosis present

## 2019-09-11 DIAGNOSIS — Z881 Allergy status to other antibiotic agents status: Secondary | ICD-10-CM

## 2019-09-11 DIAGNOSIS — G4730 Sleep apnea, unspecified: Secondary | ICD-10-CM | POA: Diagnosis present

## 2019-09-11 DIAGNOSIS — I951 Orthostatic hypotension: Secondary | ICD-10-CM | POA: Diagnosis not present

## 2019-09-11 DIAGNOSIS — M48061 Spinal stenosis, lumbar region without neurogenic claudication: Secondary | ICD-10-CM | POA: Diagnosis present

## 2019-09-11 DIAGNOSIS — Z8551 Personal history of malignant neoplasm of bladder: Secondary | ICD-10-CM

## 2019-09-11 DIAGNOSIS — G5600 Carpal tunnel syndrome, unspecified upper limb: Secondary | ICD-10-CM | POA: Diagnosis present

## 2019-09-11 DIAGNOSIS — N400 Enlarged prostate without lower urinary tract symptoms: Secondary | ICD-10-CM | POA: Diagnosis present

## 2019-09-11 DIAGNOSIS — Z8739 Personal history of other diseases of the musculoskeletal system and connective tissue: Secondary | ICD-10-CM

## 2019-09-11 DIAGNOSIS — R040 Epistaxis: Secondary | ICD-10-CM | POA: Diagnosis not present

## 2019-09-11 DIAGNOSIS — Z791 Long term (current) use of non-steroidal anti-inflammatories (NSAID): Secondary | ICD-10-CM

## 2019-09-11 DIAGNOSIS — Z7982 Long term (current) use of aspirin: Secondary | ICD-10-CM

## 2019-09-11 DIAGNOSIS — D649 Anemia, unspecified: Secondary | ICD-10-CM | POA: Diagnosis present

## 2019-09-11 DIAGNOSIS — M40209 Unspecified kyphosis, site unspecified: Secondary | ICD-10-CM | POA: Diagnosis present

## 2019-09-11 DIAGNOSIS — Q549 Hypospadias, unspecified: Secondary | ICD-10-CM

## 2019-09-11 DIAGNOSIS — I340 Nonrheumatic mitral (valve) insufficiency: Secondary | ICD-10-CM | POA: Diagnosis present

## 2019-09-11 DIAGNOSIS — I10 Essential (primary) hypertension: Secondary | ICD-10-CM

## 2019-09-11 DIAGNOSIS — Z7951 Long term (current) use of inhaled steroids: Secondary | ICD-10-CM

## 2019-09-11 DIAGNOSIS — M5136 Other intervertebral disc degeneration, lumbar region: Secondary | ICD-10-CM

## 2019-09-11 DIAGNOSIS — M549 Dorsalgia, unspecified: Secondary | ICD-10-CM | POA: Diagnosis present

## 2019-09-11 DIAGNOSIS — R7303 Prediabetes: Secondary | ICD-10-CM | POA: Diagnosis present

## 2019-09-11 DIAGNOSIS — J309 Allergic rhinitis, unspecified: Secondary | ICD-10-CM | POA: Diagnosis present

## 2019-09-11 DIAGNOSIS — K219 Gastro-esophageal reflux disease without esophagitis: Secondary | ICD-10-CM | POA: Diagnosis present

## 2019-09-11 DIAGNOSIS — Z87440 Personal history of urinary (tract) infections: Secondary | ICD-10-CM | POA: Diagnosis not present

## 2019-09-11 DIAGNOSIS — E785 Hyperlipidemia, unspecified: Secondary | ICD-10-CM | POA: Diagnosis present

## 2019-09-11 DIAGNOSIS — D696 Thrombocytopenia, unspecified: Secondary | ICD-10-CM

## 2019-09-11 DIAGNOSIS — Z8619 Personal history of other infectious and parasitic diseases: Secondary | ICD-10-CM | POA: Diagnosis not present

## 2019-09-11 DIAGNOSIS — Z981 Arthrodesis status: Secondary | ICD-10-CM | POA: Diagnosis not present

## 2019-09-11 DIAGNOSIS — M5116 Intervertebral disc disorders with radiculopathy, lumbar region: Secondary | ICD-10-CM | POA: Diagnosis present

## 2019-09-11 DIAGNOSIS — Z8349 Family history of other endocrine, nutritional and metabolic diseases: Secondary | ICD-10-CM

## 2019-09-11 DIAGNOSIS — F419 Anxiety disorder, unspecified: Secondary | ICD-10-CM | POA: Diagnosis present

## 2019-09-11 DIAGNOSIS — Z8249 Family history of ischemic heart disease and other diseases of the circulatory system: Secondary | ICD-10-CM

## 2019-09-11 DIAGNOSIS — Z79899 Other long term (current) drug therapy: Secondary | ICD-10-CM

## 2019-09-11 DIAGNOSIS — K5900 Constipation, unspecified: Secondary | ICD-10-CM | POA: Diagnosis not present

## 2019-09-11 DIAGNOSIS — Z419 Encounter for procedure for purposes other than remedying health state, unspecified: Secondary | ICD-10-CM

## 2019-09-11 HISTORY — PX: POSTERIOR LUMBAR FUSION 4 WITH HARDWARE REMOVAL: SHX6038

## 2019-09-11 HISTORY — PX: ANTERIOR LAT LUMBAR FUSION: SHX1168

## 2019-09-11 LAB — PROTIME-INR
INR: 1.2 (ref 0.8–1.2)
Prothrombin Time: 14.8 seconds (ref 11.4–15.2)

## 2019-09-11 LAB — APTT: aPTT: 30 seconds (ref 24–36)

## 2019-09-11 SURGERY — ANTERIOR LATERAL LUMBAR FUSION 1 LEVEL
Anesthesia: General | Site: Back

## 2019-09-11 MED ORDER — ROCURONIUM BROMIDE 100 MG/10ML IV SOLN
INTRAVENOUS | Status: DC | PRN
  Administered 2019-09-11: 5 mg via INTRAVENOUS

## 2019-09-11 MED ORDER — ONDANSETRON HCL 4 MG/2ML IJ SOLN
INTRAMUSCULAR | Status: DC | PRN
  Administered 2019-09-11: 4 mg via INTRAVENOUS

## 2019-09-11 MED ORDER — METHOCARBAMOL 1000 MG/10ML IJ SOLN
500.0000 mg | Freq: Four times a day (QID) | INTRAVENOUS | Status: DC
  Administered 2019-09-11 – 2019-09-12 (×2): 500 mg via INTRAVENOUS
  Filled 2019-09-11 (×5): qty 5

## 2019-09-11 MED ORDER — EPHEDRINE SULFATE 50 MG/ML IJ SOLN
INTRAMUSCULAR | Status: DC | PRN
  Administered 2019-09-11 (×4): 10 mg via INTRAVENOUS

## 2019-09-11 MED ORDER — OXYCODONE HCL 5 MG PO TABS
10.0000 mg | ORAL_TABLET | ORAL | Status: DC | PRN
  Administered 2019-09-12 – 2019-09-13 (×2): 10 mg via ORAL
  Filled 2019-09-11 (×3): qty 2

## 2019-09-11 MED ORDER — OXYCODONE HCL 5 MG PO TABS
5.0000 mg | ORAL_TABLET | ORAL | Status: DC | PRN
  Administered 2019-09-12 – 2019-09-15 (×4): 5 mg via ORAL
  Filled 2019-09-11 (×5): qty 1

## 2019-09-11 MED ORDER — SODIUM CHLORIDE (PF) 0.9 % IJ SOLN
INTRAMUSCULAR | Status: AC
  Filled 2019-09-11: qty 20

## 2019-09-11 MED ORDER — SODIUM CHLORIDE (PF) 0.9 % IJ SOLN
INTRAMUSCULAR | Status: AC
  Filled 2019-09-11: qty 50

## 2019-09-11 MED ORDER — LISINOPRIL 5 MG PO TABS
5.0000 mg | ORAL_TABLET | Freq: Every day | ORAL | Status: DC
  Administered 2019-09-15 – 2019-09-16 (×2): 5 mg via ORAL
  Filled 2019-09-11 (×3): qty 1

## 2019-09-11 MED ORDER — BUPIVACAINE LIPOSOME 1.3 % IJ SUSP
INTRAMUSCULAR | Status: AC
  Filled 2019-09-11: qty 20

## 2019-09-11 MED ORDER — DEXAMETHASONE SODIUM PHOSPHATE 10 MG/ML IJ SOLN
INTRAMUSCULAR | Status: AC
  Filled 2019-09-11: qty 1

## 2019-09-11 MED ORDER — PROPOFOL 10 MG/ML IV BOLUS
INTRAVENOUS | Status: AC
  Filled 2019-09-11: qty 20

## 2019-09-11 MED ORDER — FENTANYL CITRATE (PF) 100 MCG/2ML IJ SOLN
50.0000 ug | Freq: Once | INTRAMUSCULAR | Status: AC
  Administered 2019-09-11: 13:00:00 50 ug via INTRAVENOUS

## 2019-09-11 MED ORDER — PANTOPRAZOLE SODIUM 40 MG PO TBEC
40.0000 mg | DELAYED_RELEASE_TABLET | Freq: Every day | ORAL | Status: DC
  Administered 2019-09-12 – 2019-09-16 (×4): 40 mg via ORAL
  Filled 2019-09-11 (×5): qty 1

## 2019-09-11 MED ORDER — PHENYLEPHRINE HCL (PRESSORS) 10 MG/ML IV SOLN
INTRAVENOUS | Status: DC | PRN
  Administered 2019-09-11 (×3): 100 ug via INTRAVENOUS

## 2019-09-11 MED ORDER — HYDROCHLOROTHIAZIDE 10 MG/ML ORAL SUSPENSION
6.2500 mg | Freq: Every day | ORAL | Status: DC
  Administered 2019-09-15 – 2019-09-16 (×2): 6.25 mg via ORAL
  Filled 2019-09-11 (×5): qty 1.25

## 2019-09-11 MED ORDER — PROMETHAZINE HCL 25 MG/ML IJ SOLN
12.5000 mg | Freq: Four times a day (QID) | INTRAMUSCULAR | Status: DC | PRN
  Administered 2019-09-11: 12.5 mg via INTRAVENOUS
  Filled 2019-09-11: qty 1

## 2019-09-11 MED ORDER — FENTANYL CITRATE (PF) 100 MCG/2ML IJ SOLN
25.0000 ug | INTRAMUSCULAR | Status: AC | PRN
  Administered 2019-09-11 (×6): 25 ug via INTRAVENOUS

## 2019-09-11 MED ORDER — BACITRACIN 50000 UNITS IM SOLR
INTRAMUSCULAR | Status: AC
  Filled 2019-09-11: qty 1

## 2019-09-11 MED ORDER — LISINOPRIL-HYDROCHLOROTHIAZIDE 10-12.5 MG PO TABS: 0.5000 | ORAL_TABLET | Freq: Every day | ORAL | Status: DC

## 2019-09-11 MED ORDER — VANCOMYCIN HCL IN DEXTROSE 1-5 GM/200ML-% IV SOLN: 1000.0000 mg | Freq: Two times a day (BID) | INTRAVENOUS | Status: DC

## 2019-09-11 MED ORDER — LACTATED RINGERS IV SOLN
INTRAVENOUS | Status: DC
  Administered 2019-09-11: 07:00:00 via INTRAVENOUS

## 2019-09-11 MED ORDER — PROPOFOL 10 MG/ML IV BOLUS
INTRAVENOUS | Status: DC | PRN
  Administered 2019-09-11: 100 mg via INTRAVENOUS

## 2019-09-11 MED ORDER — HYDROMORPHONE HCL 1 MG/ML IJ SOLN
INTRAMUSCULAR | Status: AC
  Administered 2019-09-11: 0.5 mg via INTRAVENOUS
  Filled 2019-09-11: qty 1

## 2019-09-11 MED ORDER — LEVOFLOXACIN IN D5W 750 MG/150ML IV SOLN
750.0000 mg | INTRAVENOUS | Status: AC
  Administered 2019-09-11 – 2019-09-13 (×3): 750 mg via INTRAVENOUS
  Filled 2019-09-11 (×3): qty 150

## 2019-09-11 MED ORDER — SODIUM CHLORIDE 0.9% FLUSH
INTRAVENOUS | Status: DC | PRN
  Administered 2019-09-11: 10 mL

## 2019-09-11 MED ORDER — SODIUM CHLORIDE 0.9 % IV SOLN
INTRAVENOUS | Status: DC | PRN
  Administered 2019-09-11: 08:00:00 30 ug/min via INTRAVENOUS

## 2019-09-11 MED ORDER — POLYETHYLENE GLYCOL 3350 17 G PO PACK
17.0000 g | PACK | Freq: Every day | ORAL | Status: DC
  Administered 2019-09-12 – 2019-09-16 (×3): 17 g via ORAL
  Filled 2019-09-11 (×4): qty 1

## 2019-09-11 MED ORDER — DIAZEPAM 5 MG PO TABS
5.0000 mg | ORAL_TABLET | Freq: Once | ORAL | Status: DC
  Filled 2019-09-11: qty 1

## 2019-09-11 MED ORDER — MIDAZOLAM HCL 2 MG/2ML IJ SOLN
INTRAMUSCULAR | Status: DC | PRN
  Administered 2019-09-11: 2 mg via INTRAVENOUS

## 2019-09-11 MED ORDER — SUCCINYLCHOLINE CHLORIDE 20 MG/ML IJ SOLN
INTRAMUSCULAR | Status: DC | PRN
  Administered 2019-09-11: 100 mg via INTRAVENOUS

## 2019-09-11 MED ORDER — SODIUM CHLORIDE 0.9 % IV BOLUS
1000.0000 mL | Freq: Once | INTRAVENOUS | Status: AC
  Administered 2019-09-11: 1000 mL via INTRAVENOUS

## 2019-09-11 MED ORDER — MIDAZOLAM HCL 2 MG/2ML IJ SOLN
INTRAMUSCULAR | Status: AC
  Filled 2019-09-11: qty 2

## 2019-09-11 MED ORDER — HEPARIN SODIUM (PORCINE) 10000 UNIT/ML IJ SOLN
INTRAMUSCULAR | Status: AC
  Filled 2019-09-11: qty 1

## 2019-09-11 MED ORDER — PHENYLEPHRINE HCL (PRESSORS) 10 MG/ML IV SOLN
INTRAVENOUS | Status: AC
  Filled 2019-09-11: qty 1

## 2019-09-11 MED ORDER — SODIUM CHLORIDE 0.9% FLUSH
3.0000 mL | Freq: Two times a day (BID) | INTRAVENOUS | Status: DC
  Administered 2019-09-11 – 2019-09-15 (×8): 3 mL via INTRAVENOUS

## 2019-09-11 MED ORDER — FENTANYL CITRATE (PF) 100 MCG/2ML IJ SOLN
INTRAMUSCULAR | Status: AC
  Filled 2019-09-11: qty 2

## 2019-09-11 MED ORDER — HYDROMORPHONE HCL 1 MG/ML IJ SOLN: 0.5000 mg | INTRAMUSCULAR | Status: DC | PRN

## 2019-09-11 MED ORDER — CEFAZOLIN SODIUM-DEXTROSE 2-4 GM/100ML-% IV SOLN
INTRAVENOUS | Status: AC
  Filled 2019-09-11: qty 100

## 2019-09-11 MED ORDER — VANCOMYCIN HCL 1000 MG IV SOLR
INTRAVENOUS | Status: AC
  Filled 2019-09-11: qty 2000

## 2019-09-11 MED ORDER — THROMBIN 5000 UNITS EX SOLR
CUTANEOUS | Status: DC | PRN
  Administered 2019-09-11 (×2): 5000 [IU] via TOPICAL

## 2019-09-11 MED ORDER — LIDOCAINE HCL (CARDIAC) PF 100 MG/5ML IV SOSY
PREFILLED_SYRINGE | INTRAVENOUS | Status: DC | PRN
  Administered 2019-09-11: 50 mg via INTRAVENOUS

## 2019-09-11 MED ORDER — SUCCINYLCHOLINE CHLORIDE 20 MG/ML IJ SOLN
INTRAMUSCULAR | Status: AC
  Filled 2019-09-11: qty 1

## 2019-09-11 MED ORDER — LIDOCAINE HCL (PF) 2 % IJ SOLN
INTRAMUSCULAR | Status: AC
  Filled 2019-09-11: qty 10

## 2019-09-11 MED ORDER — VANCOMYCIN HCL IN DEXTROSE 1-5 GM/200ML-% IV SOLN
INTRAVENOUS | Status: AC
  Filled 2019-09-11: qty 200

## 2019-09-11 MED ORDER — DEXMEDETOMIDINE HCL IN NACL 200 MCG/50ML IV SOLN
INTRAVENOUS | Status: DC | PRN
  Administered 2019-09-11: 8 ug via INTRAVENOUS
  Administered 2019-09-11: 4 ug via INTRAVENOUS

## 2019-09-11 MED ORDER — TAMSULOSIN HCL 0.4 MG PO CAPS
0.4000 mg | ORAL_CAPSULE | Freq: Every day | ORAL | Status: DC
  Administered 2019-09-12 – 2019-09-16 (×3): 0.4 mg via ORAL
  Filled 2019-09-11 (×7): qty 1

## 2019-09-11 MED ORDER — SODIUM CHLORIDE 0.9 % IV SOLN
1.0000 g | Freq: Three times a day (TID) | INTRAVENOUS | Status: DC
  Administered 2019-09-11: 16:00:00 1 g via INTRAVENOUS
  Filled 2019-09-11 (×4): qty 1

## 2019-09-11 MED ORDER — ACETAMINOPHEN 500 MG PO TABS
1000.0000 mg | ORAL_TABLET | Freq: Four times a day (QID) | ORAL | Status: AC
  Administered 2019-09-11 – 2019-09-12 (×4): 1000 mg via ORAL
  Filled 2019-09-11 (×4): qty 2

## 2019-09-11 MED ORDER — MAGNESIUM CITRATE PO SOLN
1.0000 | Freq: Once | ORAL | Status: AC | PRN
  Administered 2019-09-15: 1 via ORAL
  Filled 2019-09-11 (×3): qty 296

## 2019-09-11 MED ORDER — SODIUM CHLORIDE 0.9% FLUSH: 3.0000 mL | INTRAVENOUS | Status: DC | PRN

## 2019-09-11 MED ORDER — REMIFENTANIL HCL 1 MG IV SOLR
INTRAVENOUS | Status: AC
  Filled 2019-09-11: qty 1000

## 2019-09-11 MED ORDER — FENTANYL CITRATE (PF) 100 MCG/2ML IJ SOLN
INTRAMUSCULAR | Status: AC
  Administered 2019-09-11: 13:00:00 25 ug via INTRAVENOUS
  Filled 2019-09-11: qty 2

## 2019-09-11 MED ORDER — VANCOMYCIN HCL IN DEXTROSE 750-5 MG/150ML-% IV SOLN
750.0000 mg | Freq: Two times a day (BID) | INTRAVENOUS | Status: DC
  Administered 2019-09-11 – 2019-09-12 (×2): 750 mg via INTRAVENOUS
  Filled 2019-09-11 (×3): qty 150

## 2019-09-11 MED ORDER — KETAMINE HCL 50 MG/ML IJ SOLN
INTRAMUSCULAR | Status: DC | PRN
  Administered 2019-09-11 (×2): 25 mg via INTRAVENOUS

## 2019-09-11 MED ORDER — ACETAMINOPHEN 650 MG RE SUPP: 650.0000 mg | RECTAL | Status: DC | PRN

## 2019-09-11 MED ORDER — FENTANYL CITRATE (PF) 100 MCG/2ML IJ SOLN
INTRAMUSCULAR | Status: DC | PRN
  Administered 2019-09-11: 25 ug via INTRAVENOUS
  Administered 2019-09-11: 50 ug via INTRAVENOUS
  Administered 2019-09-11: 25 ug via INTRAVENOUS

## 2019-09-11 MED ORDER — MENTHOL 3 MG MT LOZG
1.0000 | LOZENGE | OROMUCOSAL | Status: DC | PRN
  Administered 2019-09-16: 3 mg via ORAL
  Filled 2019-09-11: qty 9

## 2019-09-11 MED ORDER — SODIUM CHLORIDE 0.9 % IR SOLN
Status: DC | PRN
  Administered 2019-09-11: 1000 mL

## 2019-09-11 MED ORDER — SUCRALFATE 1 G PO TABS
1.0000 g | ORAL_TABLET | Freq: Three times a day (TID) | ORAL | Status: DC
  Administered 2019-09-11 – 2019-09-16 (×12): 1 g via ORAL
  Filled 2019-09-11 (×16): qty 1

## 2019-09-11 MED ORDER — GLYCOPYRROLATE 0.2 MG/ML IJ SOLN
INTRAMUSCULAR | Status: AC
  Filled 2019-09-11: qty 1

## 2019-09-11 MED ORDER — TRAZODONE HCL 50 MG PO TABS
50.0000 mg | ORAL_TABLET | Freq: Every evening | ORAL | Status: DC | PRN
  Filled 2019-09-11: qty 1

## 2019-09-11 MED ORDER — KETOROLAC TROMETHAMINE 30 MG/ML IJ SOLN
30.0000 mg | Freq: Once | INTRAMUSCULAR | Status: DC
  Filled 2019-09-11: qty 1

## 2019-09-11 MED ORDER — METHYLPREDNISOLONE ACETATE 40 MG/ML IJ SUSP
INTRAMUSCULAR | Status: AC
  Filled 2019-09-11: qty 1

## 2019-09-11 MED ORDER — ROCURONIUM BROMIDE 50 MG/5ML IV SOLN
INTRAVENOUS | Status: AC
  Filled 2019-09-11: qty 1

## 2019-09-11 MED ORDER — SODIUM CHLORIDE 0.9 % IV SOLN: 250.0000 mL | INTRAVENOUS | Status: DC

## 2019-09-11 MED ORDER — BUPIVACAINE-EPINEPHRINE 0.5% -1:200000 IJ SOLN
INTRAMUSCULAR | Status: DC | PRN
  Administered 2019-09-11: 10 mL

## 2019-09-11 MED ORDER — VANCOMYCIN HCL 1000 MG IV SOLR
INTRAVENOUS | Status: DC | PRN
  Administered 2019-09-11: 2 g

## 2019-09-11 MED ORDER — KETAMINE HCL 50 MG/ML IJ SOLN
INTRAMUSCULAR | Status: AC
  Filled 2019-09-11: qty 10

## 2019-09-11 MED ORDER — DEXMEDETOMIDINE HCL IN NACL 80 MCG/20ML IV SOLN
INTRAVENOUS | Status: AC
  Filled 2019-09-11: qty 20

## 2019-09-11 MED ORDER — ONDANSETRON HCL 4 MG/2ML IJ SOLN
INTRAMUSCULAR | Status: AC
  Filled 2019-09-11: qty 2

## 2019-09-11 MED ORDER — BUPIVACAINE-EPINEPHRINE (PF) 0.5% -1:200000 IJ SOLN
INTRAMUSCULAR | Status: AC
  Filled 2019-09-11: qty 30

## 2019-09-11 MED ORDER — CEFAZOLIN SODIUM-DEXTROSE 2-4 GM/100ML-% IV SOLN
2.0000 g | INTRAVENOUS | Status: AC
  Administered 2019-09-11 (×2): 2 g via INTRAVENOUS

## 2019-09-11 MED ORDER — VANCOMYCIN HCL IN DEXTROSE 1-5 GM/200ML-% IV SOLN: 1000.0000 mg | Freq: Once | INTRAVENOUS | Status: DC

## 2019-09-11 MED ORDER — REMIFENTANIL HCL 1 MG IV SOLR
INTRAVENOUS | Status: DC | PRN
  Administered 2019-09-11: .1 ug/kg/min via INTRAVENOUS

## 2019-09-11 MED ORDER — SENNA 8.6 MG PO TABS
1.0000 | ORAL_TABLET | Freq: Two times a day (BID) | ORAL | Status: DC
  Administered 2019-09-11 – 2019-09-16 (×9): 8.6 mg via ORAL
  Filled 2019-09-11 (×10): qty 1

## 2019-09-11 MED ORDER — SODIUM CHLORIDE 0.9 % IV SOLN
INTRAVENOUS | Status: DC
  Administered 2019-09-11: 14:00:00 via INTRAVENOUS

## 2019-09-11 MED ORDER — ONDANSETRON HCL 4 MG/2ML IJ SOLN
4.0000 mg | Freq: Four times a day (QID) | INTRAMUSCULAR | Status: DC | PRN
  Administered 2019-09-11 – 2019-09-13 (×3): 4 mg via INTRAVENOUS
  Filled 2019-09-11 (×3): qty 2

## 2019-09-11 MED ORDER — THROMBIN 5000 UNITS EX SOLR
CUTANEOUS | Status: AC
  Filled 2019-09-11: qty 5000

## 2019-09-11 MED ORDER — ONDANSETRON HCL 4 MG PO TABS
4.0000 mg | ORAL_TABLET | Freq: Four times a day (QID) | ORAL | Status: DC | PRN
  Administered 2019-09-12: 14:00:00 4 mg via ORAL
  Filled 2019-09-11: qty 1

## 2019-09-11 MED ORDER — HYDROMORPHONE HCL 1 MG/ML IJ SOLN
0.5000 mg | INTRAMUSCULAR | Status: DC | PRN
  Administered 2019-09-11 (×2): 0.5 mg via INTRAVENOUS

## 2019-09-11 MED ORDER — PHENOL 1.4 % MT LIQD: 1.0000 | OROMUCOSAL | Status: DC | PRN

## 2019-09-11 MED ORDER — SODIUM CHLORIDE FLUSH 0.9 % IV SOLN
INTRAVENOUS | Status: AC
  Filled 2019-09-11: qty 10

## 2019-09-11 MED ORDER — METHOCARBAMOL 500 MG PO TABS
1000.0000 mg | ORAL_TABLET | Freq: Four times a day (QID) | ORAL | Status: DC
  Administered 2019-09-11 – 2019-09-14 (×7): 1000 mg via ORAL
  Filled 2019-09-11 (×10): qty 2

## 2019-09-11 MED ORDER — BUPIVACAINE HCL (PF) 0.5 % IJ SOLN
INTRAMUSCULAR | Status: AC
  Filled 2019-09-11: qty 30

## 2019-09-11 MED ORDER — GLYCOPYRROLATE 0.2 MG/ML IJ SOLN
INTRAMUSCULAR | Status: DC | PRN
  Administered 2019-09-11: 0.2 mg via INTRAVENOUS

## 2019-09-11 MED ORDER — ONDANSETRON HCL 4 MG/2ML IJ SOLN: 4.0000 mg | Freq: Once | INTRAMUSCULAR | Status: DC | PRN

## 2019-09-11 MED ORDER — ACETAMINOPHEN 325 MG PO TABS: 650.0000 mg | ORAL_TABLET | ORAL | Status: DC | PRN

## 2019-09-11 MED ORDER — TRAZODONE HCL 100 MG PO TABS
100.0000 mg | ORAL_TABLET | Freq: Every day | ORAL | Status: DC
  Administered 2019-09-11 – 2019-09-15 (×5): 100 mg via ORAL
  Filled 2019-09-11 (×6): qty 1

## 2019-09-11 MED ORDER — LACTATED RINGERS IV SOLN
INTRAVENOUS | Status: DC | PRN
  Administered 2019-09-11: 08:00:00 via INTRAVENOUS

## 2019-09-11 MED ORDER — DEXAMETHASONE SODIUM PHOSPHATE 10 MG/ML IJ SOLN
INTRAMUSCULAR | Status: DC | PRN
  Administered 2019-09-11: 10 mg via INTRAVENOUS

## 2019-09-11 MED ORDER — SIMVASTATIN 20 MG PO TABS
40.0000 mg | ORAL_TABLET | Freq: Every day | ORAL | Status: DC
  Administered 2019-09-12 – 2019-09-16 (×4): 40 mg via ORAL
  Filled 2019-09-11 (×6): qty 2

## 2019-09-11 MED ORDER — EPHEDRINE SULFATE 50 MG/ML IJ SOLN
INTRAMUSCULAR | Status: AC
  Filled 2019-09-11: qty 1

## 2019-09-11 MED ORDER — BISACODYL 5 MG PO TBEC
5.0000 mg | DELAYED_RELEASE_TABLET | Freq: Every day | ORAL | Status: DC | PRN
  Administered 2019-09-12: 5 mg via ORAL
  Filled 2019-09-11: qty 1

## 2019-09-11 MED ORDER — FENTANYL CITRATE (PF) 100 MCG/2ML IJ SOLN
INTRAMUSCULAR | Status: AC
  Administered 2019-09-11: 12:00:00 25 ug via INTRAVENOUS
  Filled 2019-09-11: qty 2

## 2019-09-11 MED ORDER — GELATIN ABSORBABLE 12-7 MM EX MISC
CUTANEOUS | Status: AC
  Filled 2019-09-11: qty 1

## 2019-09-11 SURGICAL SUPPLY — 108 items
BLADE BOVIE TIP EXT 4 (BLADE) ×3 IMPLANT
BMAC2-60-01 procedure pack ×3 IMPLANT
BUR NEURO DRILL SOFT 3.0X3.8M (BURR) ×3 IMPLANT
CANISTER SUCT 1200ML W/VALVE (MISCELLANEOUS) ×6 IMPLANT
CHLORAPREP W/TINT 26 (MISCELLANEOUS) ×12 IMPLANT
CNTNR SPEC 2.5X3XGRAD LEK (MISCELLANEOUS) ×1
CONT SPEC 4OZ STER OR WHT (MISCELLANEOUS) ×2
CONTAINER SPEC 2.5X3XGRAD LEK (MISCELLANEOUS) ×1 IMPLANT
CORD BIP STRL DISP 12FT (MISCELLANEOUS) ×3 IMPLANT
COUNTER NEEDLE 20/40 LG (NEEDLE) ×3 IMPLANT
COVER BACK TABLE REUSABLE LG (DRAPES) ×3 IMPLANT
COVER LIGHT HANDLE STERIS (MISCELLANEOUS) ×12 IMPLANT
COVER WAND RF STERILE (DRAPES) ×3 IMPLANT
CRADLE LAMINECT ARM (MISCELLANEOUS) ×6 IMPLANT
CUP MEDICINE 2OZ PLAST GRAD ST (MISCELLANEOUS) ×3 IMPLANT
DERMABOND ADVANCED (GAUZE/BANDAGES/DRESSINGS) ×4
DERMABOND ADVANCED .7 DNX12 (GAUZE/BANDAGES/DRESSINGS) ×2 IMPLANT
DRAPE C-ARM 42X72 X-RAY (DRAPES) ×6 IMPLANT
DRAPE C-ARMOR (DRAPES) ×6 IMPLANT
DRAPE INCISE IOBAN 66X45 STRL (DRAPES) ×3 IMPLANT
DRAPE LAPAROTOMY 100X77 ABD (DRAPES) ×6 IMPLANT
DRAPE MICROSCOPE SPINE 48X150 (DRAPES) ×3 IMPLANT
DRAPE POUCH INSTRU U-SHP 10X18 (DRAPES) ×3 IMPLANT
DRAPE SURG 17X11 SM STRL (DRAPES) ×12 IMPLANT
DRSG OPSITE POSTOP 4X6 (GAUZE/BANDAGES/DRESSINGS) IMPLANT
DRSG TEGADERM 2-3/8X2-3/4 SM (GAUZE/BANDAGES/DRESSINGS) IMPLANT
DRSG TEGADERM 4X4.75 (GAUZE/BANDAGES/DRESSINGS) ×3 IMPLANT
DRSG TEGADERM 6X8 (GAUZE/BANDAGES/DRESSINGS) ×3 IMPLANT
DRSG TELFA 3X8 NADH (GAUZE/BANDAGES/DRESSINGS) ×9 IMPLANT
DRSG TELFA 4X3 1S NADH ST (GAUZE/BANDAGES/DRESSINGS) IMPLANT
DURASEAL APPLICATOR TIP (TIP) IMPLANT
DURASEAL SPINE SEALANT 3ML (MISCELLANEOUS) IMPLANT
ELECT CAUTERY BLADE TIP 2.5 (TIP) ×6
ELECT EZSTD 165MM 6.5IN (MISCELLANEOUS) ×3
ELECT REM PT RETURN 9FT ADLT (ELECTROSURGICAL) ×3
ELECTRODE CAUTERY BLDE TIP 2.5 (TIP) ×2 IMPLANT
ELECTRODE EZSTD 165MM 6.5IN (MISCELLANEOUS) ×1 IMPLANT
ELECTRODE REM PT RTRN 9FT ADLT (ELECTROSURGICAL) ×1 IMPLANT
FEE INTRAOP MONITOR IMPULS NCS (MISCELLANEOUS) IMPLANT
FRAME EYE SHIELD (PROTECTIVE WEAR) ×6 IMPLANT
GLOVE BIOGEL PI IND STRL 7.0 (GLOVE) ×2 IMPLANT
GLOVE BIOGEL PI INDICATOR 7.0 (GLOVE) ×4
GLOVE SURG SYN 7.0 (GLOVE) ×12 IMPLANT
GLOVE SURG SYN 8.5  E (GLOVE) ×12
GLOVE SURG SYN 8.5 E (GLOVE) ×6 IMPLANT
GOWN SRG XL LVL 3 NONREINFORCE (GOWNS) ×2 IMPLANT
GOWN STRL NON-REIN TWL XL LVL3 (GOWNS) ×4
GOWN STRL REUS W/ TWL LRG LVL3 (GOWN DISPOSABLE) ×1 IMPLANT
GOWN STRL REUS W/TWL LRG LVL3 (GOWN DISPOSABLE) ×2
GOWN STRL REUS W/TWL MED LVL3 (GOWN DISPOSABLE) ×6 IMPLANT
GRADUATE 1200CC STRL 31836 (MISCELLANEOUS) ×3 IMPLANT
HEMOVAC 400CC 10FR (MISCELLANEOUS) ×6 IMPLANT
INTRAOP MONITOR FEE IMPULS NCS (MISCELLANEOUS)
INTRAOP MONITOR FEE IMPULSE (MISCELLANEOUS)
KIT ASP BONE MRW 120CC (KITS) ×3 IMPLANT
KIT ASP BONE MRW 60CC (KITS) ×3 IMPLANT
KIT BLADEGUARD II DBL (SET/KITS/TRAYS/PACK) ×3 IMPLANT
KIT DILATOR XLIF 5 (KITS) ×2 IMPLANT
KIT NEEDLE NVM5 EMG ELECT (KITS) ×2 IMPLANT
KIT NEEDLE NVM5 EMG ELECTRODE (KITS) ×1
KIT SPINAL PRONEVIEW (KITS) ×3 IMPLANT
KIT SURGICAL ACCESS MAXCESS 4 (KITS) ×3 IMPLANT
KIT TURNOVER KIT A (KITS) ×3 IMPLANT
KIT XLIF (KITS) ×1
KNIFE BAYONET SHORT DISCETOMY (MISCELLANEOUS) IMPLANT
MARKER SKIN DUAL TIP RULER LAB (MISCELLANEOUS) ×6 IMPLANT
MODULUS XLW 10X22X50MM 15DEG (Spine Construct) ×3 IMPLANT
NDL SAFETY ECLIPSE 18X1.5 (NEEDLE) ×1 IMPLANT
NEEDLE HYPO 18GX1.5 SHARP (NEEDLE) ×2
NEEDLE HYPO 22GX1.5 SAFETY (NEEDLE) ×3 IMPLANT
NEEDLE I PASS (NEEDLE) ×3 IMPLANT
NEEDLE I-PASS III (NEEDLE) ×3 IMPLANT
NS IRRIG 1000ML POUR BTL (IV SOLUTION) ×3 IMPLANT
PACK LAMINECTOMY NEURO (CUSTOM PROCEDURE TRAY) ×3 IMPLANT
PAD ARMBOARD 7.5X6 YLW CONV (MISCELLANEOUS) ×3 IMPLANT
PENCIL ELECTRO HAND CTR (MISCELLANEOUS) IMPLANT
PUTTY DBM PROPEL LRG (Putty) ×4 IMPLANT
PUTTY PROPEL LRG (Putty) ×2 IMPLANT
ROD RELINE-O 5.5X300 STRT NS (Rod) ×1 IMPLANT
ROD RELINE-O 5.5X300MM STRT (Rod) ×2 IMPLANT
SCREW RELINE-O 8.5X45MM POLY (Screw) ×3 IMPLANT
SCREW RELINE-O POLY 6.5X45 (Screw) ×6 IMPLANT
SCREW RELINE-O POLY 7.5X45 (Screw) ×12 IMPLANT
SCREW RELINE-O POLY 7.5X50 (Screw) ×4 IMPLANT
SCREW RLINE PLY 2S 50X7.5XPA (Screw) ×2 IMPLANT
SPOGE SURGIFLO 8M (HEMOSTASIS) ×4
SPONGE GAUZE 2X2 8PLY STER LF (GAUZE/BANDAGES/DRESSINGS)
SPONGE GAUZE 2X2 8PLY STRL LF (GAUZE/BANDAGES/DRESSINGS) IMPLANT
SPONGE LAP 18X18 RF (DISPOSABLE) ×3 IMPLANT
SPONGE SURGIFLO 8M (HEMOSTASIS) ×2 IMPLANT
STAPLER SKIN PROX 35W (STAPLE) ×12 IMPLANT
SUT DVC VLOC 3-0 CL 6 P-12 (SUTURE) ×9 IMPLANT
SUT ETHILON 3-0 FS-10 30 BLK (SUTURE) ×6
SUT NURALON 4 0 TR CR/8 (SUTURE) IMPLANT
SUT VIC AB 0 CT1 27 (SUTURE) ×6
SUT VIC AB 0 CT1 27XCR 8 STRN (SUTURE) ×3 IMPLANT
SUT VIC AB 2-0 CT1 18 (SUTURE) ×12 IMPLANT
SUT VIC AB 3-0 SH 27 (SUTURE) ×2
SUT VIC AB 3-0 SH 27X BRD (SUTURE) ×1 IMPLANT
SUT VICRYL 0 UR6 27IN ABS (SUTURE) ×3 IMPLANT
SUTURE EHLN 3-0 FS-10 30 BLK (SUTURE) ×2 IMPLANT
SYR 10ML LL (SYRINGE) ×3 IMPLANT
SYR 20ML LL LF (SYRINGE) ×3 IMPLANT
SYR 30ML LL (SYRINGE) ×6 IMPLANT
TOWEL OR 17X26 4PK STRL BLUE (TOWEL DISPOSABLE) ×12 IMPLANT
TRAY FOLEY MTR SLVR 16FR STAT (SET/KITS/TRAYS/PACK) IMPLANT
TUBING CONNECTING 10 (TUBING) ×2 IMPLANT
TUBING CONNECTING 10' (TUBING) ×1

## 2019-09-11 NOTE — Anesthesia Postprocedure Evaluation (Signed)
Anesthesia Post Note  Patient: LARRI HAMEL  Procedure(s) Performed: PRONE XLIF L2-3 (N/A Back) L2-S1 POSTERIOR FUSION, ABORTED (N/A Back)  Patient location during evaluation: PACU Anesthesia Type: General Level of consciousness: awake and alert Pain management: pain level controlled Vital Signs Assessment: post-procedure vital signs reviewed and stable Respiratory status: spontaneous breathing, nonlabored ventilation, respiratory function stable and patient connected to nasal cannula oxygen Cardiovascular status: blood pressure returned to baseline and stable Postop Assessment: no apparent nausea or vomiting Anesthetic complications: no     Last Vitals:  Vitals:   09/11/19 1306 09/11/19 1313  BP: 103/67 93/66  Pulse: 97 94  Resp: 16 16  Temp: (!) 36.1 C   SpO2: 100% 100%    Last Pain:  Vitals:   09/11/19 1313  TempSrc:   PainSc: 3                  Zala Degrasse S

## 2019-09-11 NOTE — Anesthesia Procedure Notes (Signed)
Procedure Name: Intubation Performed by: Kanisha Duba, CRNA Pre-anesthesia Checklist: Patient identified, Patient being monitored, Timeout performed, Emergency Drugs available and Suction available Patient Re-evaluated:Patient Re-evaluated prior to induction Oxygen Delivery Method: Circle system utilized Preoxygenation: Pre-oxygenation with 100% oxygen Induction Type: IV induction Ventilation: Mask ventilation without difficulty Laryngoscope Size: McGraph and 4 Grade View: Grade I Tube type: Oral Tube size: 7.5 mm Number of attempts: 1 Airway Equipment and Method: Stylet,  Video-laryngoscopy and LTA kit utilized Placement Confirmation: ETT inserted through vocal cords under direct vision,  positive ETCO2 and breath sounds checked- equal and bilateral Secured at: 22 cm Tube secured with: Tape Dental Injury: Teeth and Oropharynx as per pre-operative assessment        

## 2019-09-11 NOTE — Progress Notes (Signed)
I discussed the events of the OR today and the possible contamination due to a breach in the flouroscopy drape.  He expressed understanding though is disappointed, as I would expect.  I answered his questions as best I could.  I will talk with others in my department as well as ID to determine when to replace the posterior implants.

## 2019-09-11 NOTE — Consult Note (Signed)
Pharmacy Antibiotic Note  Trevor [name] is a 73 y.o. male admitted on 09/11/2019 following planned surgical procedure.  Pharmacy has been consulted for vancomycin dosing for possible OR contamination.   Spoke with SDS nurse who confirmed that patient did received Vancomycin 1000mg  IV x 1 dose around 0715 on 9/30. Dose was not documented on MAR but there is a cabinet override for Vancomycin 1g IV.   Plan: Will start Vancomycin 750 mg IV Q 12 hrs. Goal AUC 400-550. Expected AUC: 431.7 SCr used: 0.96 Css: 13.1   Pharmacy will continue to monitor and adjust dose as needed.    Height: 5' 7.5" (171.5 cm) Weight: 180 lb 1.9 oz (81.7 kg) IBW/kg (Calculated) : 67.25  Temp (24hrs), Avg:97.4 F (36.3 C), Min:96.4 F (35.8 C), Max:98.7 F (37.1 C)  Recent Labs  Lab 09/05/19 1500  WBC 5.9  CREATININE 0.95    Estimated Creatinine Clearance: 71.6 mL/min (by C-G formula based on SCr of 0.95 mg/dL).    Allergies  Allergen Reactions  . Ceftriaxone Hives    Antimicrobials this admission: 9/30 vancomycin >>  9/30 cefepime  >>   Thank you for allowing pharmacy to be a part of this patient's care.  Pernell Dupre, PharmD, BCPS Clinical Pharmacist 09/11/2019 2:19 PM

## 2019-09-11 NOTE — Consult Note (Addendum)
NAME: Trevor [name]  DOB: Feb 05, 1946  MRN: [id number]  Date/Time: 09/11/2019 8:00 PM  REQUESTING PROVIDER: Dr.Yarbrough Subjective:  REASON FOR CONSULT: possible contamination due to breach in the fluoroscopy drape ? Trevor [name] is a 73 y.o. 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 Started on IV doxy and Iv ceftriaxonee- concern for a rash VS herpes lesions around his lips  7/30 MRI of LS fluid collection from [facility] to epidural space . 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 Postoperative changes with fusion hardware from L3-S1 without obvious complicating features. 2. Stable appearing spinal and bilateral lateral recess stenosis at L3-4 and L4-5. 3. Progressive degenerative disc disease at L2-3 with marked endplate reactive changes. There is a central recurrent disc protrusion with mass effect on the thecal sac contributing to mild spinal and moderate bilateral lateral recess stenosis   He was brought in today for extension  of his fusion to L2, for a lateral lumbar interbody fusion at L2-L3  As per Dr.Yrbrough's note and then talking to him there was a possible breach in  the surgical field due to a hole identified in the drape of the C arm and all the hardware was removed except two screws which fractured I am asked to advise on antibiotic management Past Medical History:  Diagnosis Date  . Acne   . Allergic rhinitis   . Anemia   . Anxiety   . BPH (benign prostatic hypertrophy) with urinary obstruction   . Cancer [facility])    bladder CA; remission since Oct 2014  . Carpal tunnel syndrome   . Cervicalgia   . Degenerative disc disease, cervical    neck and back;   . Depression    controlled;   Marland Kitchen Gastritis   . GERD (gastroesophageal reflux disease)   . Hemorrhoids   . History of malignant neoplasm of bladder   . Hyperlipidemia   . Hypertension    controlled with medication;   . Hypospadias   . Low back pain   . Mitral regurgitation   . Pre-diabetes   . Sleep apnea   . Urinary frequency    Bladder malignancy  Past Surgical History:  Procedure Laterality Date  . ANTERIOR LAT LUMBAR FUSION N/A 09/11/2019   Procedure: PRONE XLIF L2-3;  Surgeon: Meade Maw, MD;  Location: [facility] ORS;  Service: Neurosurgery;  Laterality: N/A;  . BLADDER SURGERY     2011, 2013, 2015  . COLONOSCOPY WITH PROPOFOL N/A 07/31/2015   Procedure: COLONOSCOPY WITH PROPOFOL;  Surgeon: Manya Silvas, MD;  Location[facility]COPY;  Service: Endoscopy;  Laterality: N/A;  . COLONOSCOPY, ESOPHAGOGASTRODUODENOSCOPY (EGD) AND ESOPHAGEAL DILATION    . FLEXIBLE SIGMOIDOSCOPY    .  LUMBAR LAMINECTOMY/DECOMPRESSION MICRODISCECTOMY N/A 06/20/2018   Procedure: LUMBAR LAMINECTOMY/DECOMPRESSION MICRODISCECTOMY 1 LEVEL-L-2-3;  Surgeon: Meade Maw, MD;  Location: [facility] ORS;  Service: Neurosurgery;  Laterality: N/A;  . POSTERIOR LUMBAR FUSION 4 WITH HARDWARE REMOVAL N/A 09/11/2019   Procedure: L2-S1 POSTERIOR FUSION, ABORTED;  Surgeon: Meade Maw, MD;  Location: [facility] ORS;  Service: Neurosurgery;  Laterality: N/A;  . SEPTOPLASTY    . TONSILLECTOMY      Social History    Socioeconomic History  . Marital status: Single    Spouse name: Not on file  . Number of children: Not on file  . Years of education: Not on file  . Highest education level: Not on file  Occupational History  . Not on file  Social Needs  . Financial resource strain: Not on file  . Food insecurity    Worry: Not on file    Inability: Not on file  . Transportation needs    Medical: Not on file    Non-medical: Not on file  Tobacco Use  . Smoking status: Never Smoker  . Smokeless tobacco: Never Used  Substance and Sexual Activity  . Alcohol use: No    Alcohol/week: 0.0 standard drinks  . Drug use: No  . Sexual activity: Not on file  Lifestyle  . Physical activity    Days per week: Not on file    Minutes per session: Not on file  . Stress: Not on file  Relationships  . Social Herbalist on phone: Not on file    Gets together: Not on file    Attends religious service: Not on file    Active member of club or organization: Not on file    Attends meetings of clubs or organizations: Not on file    Relationship status: Not on file  . Intimate partner violence    Fear of current or ex partner: Not on file    Emotionally abused: Not on file    Physically abused: Not on file    Forced sexual activity: Not on file  Other Topics Concern  . Not on file  Social History Narrative  . Not on file    Family History  Problem Relation Age of Onset  . Hyperlipidemia Brother   . Hyperlipidemia Sister   . Heart attack Father    Allergies  Allergen Reactions  . Ceftriaxone Hives    Blisters and Hives around mouth and neck.  Some blistering of lips.  Mouth not effected.  Patient started on ceftriaxone and doxycyline 1-2 days prior to reaction.    . Doxycycline Hives    Blisters and Hives around mouth and neck.  Some blistering of lips.  Mouth not effected. Patient started on ceftriaxone and doxycyline 1-2 days prior to reaction.      ? Current Facility-Administered  Medications  Medication Dose Route Frequency Provider Last Rate Last Dose  . 0.9 %  sodium chloride infusion   Intravenous Continuous Marin Olp, PA-C   Stopped at 09/11/19 1830  . 0.9 %  sodium chloride infusion  250 mL Intravenous Continuous Marin Olp, PA-C      . acetaminophen (TYLENOL) tablet 650 mg  650 mg Oral Q4H PRN Marin Olp, PA-C       Or  . acetaminophen (TYLENOL) suppository 650 mg  650 mg Rectal Q4H PRN Marin Olp, PA-C      . acetaminophen (TYLENOL) tablet 1,000 mg  1,000 mg Oral Q6H Marin Olp, PA-C   1,000 mg at 09/11/19  1831  . bisacodyl (DULCOLAX) EC tablet 5 mg  5 mg Oral Daily PRN Marin Olp, PA-C      . diazepam (VALIUM) tablet 5 mg  5 mg Oral Once Marin Olp, PA-C      . [START ON 09/12/2019] lisinopril (ZESTRIL) tablet 5 mg  5 mg Oral Daily Hallaji, Sheema M, RPH       And  . [START ON 09/12/2019] hydrochlorothiazide 10 mg/mL oral suspension 6.25 mg  6.25 mg Oral Daily Hallaji, Sheema M, RPH      . HYDROmorphone (DILAUDID) injection 0.5 mg  0.5 mg Intravenous Q2H PRN Marin Olp, PA-C      . levofloxacin (LEVAQUIN) IVPB 750 mg  750 mg Intravenous Q24H Tsosie Billing, MD 100 mL/hr at 09/11/19 1835    . magnesium citrate solution 1 Bottle  1 Bottle Oral Once PRN Marin Olp, PA-C      . menthol-cetylpyridinium (CEPACOL) lozenge 3 mg  1 lozenge Oral PRN Marin Olp, PA-C       Or  . phenol (CHLORASEPTIC) mouth spray 1 spray  1 spray Mouth/Throat PRN Marin Olp, PA-C      . methocarbamol (ROBAXIN) tablet 1,000 mg  1,000 mg Oral Q6H Marin Olp, PA-C   1,000 mg at 09/11/19 1831   Or  . methocarbamol (ROBAXIN) 500 mg in dextrose 5 % 50 mL IVPB  500 mg Intravenous Q6H Marin Olp, PA-C   Stopped at 09/11/19 1258  . ondansetron (ZOFRAN) tablet 4 mg  4 mg Oral Q6H PRN Marin Olp, PA-C       Or  . ondansetron [facility]) injection 4 mg  4 mg Intravenous Q6H PRN Marin Olp, PA-C   4 mg at 09/11/19 1640  . oxyCODONE (Oxy IR/ROXICODONE)  immediate release tablet 10 mg  10 mg Oral Q3H PRN Marin Olp, PA-C      . oxyCODONE (Oxy IR/ROXICODONE) immediate release tablet 5 mg  5 mg Oral Q3H PRN Marin Olp, PA-C      . [START ON 09/12/2019] pantoprazole (PROTONIX) EC tablet 40 mg  40 mg Oral Daily Ferri, Amanda, PA-C      . polyethylene glycol (MIRALAX / GLYCOLAX) packet 17 g  17 g Oral Daily Ferri, Amanda, PA-C      . senna (SENOKOT) tablet 8.6 mg  1 tablet Oral BID Ferri, Amanda, PA-C      . simvastatin (ZOCOR) tablet 40 mg  40 mg Oral Daily Marin Olp, PA-C      . sodium chloride flush (NS) 0.9 % injection 3 mL  3 mL Intravenous Q12H Marin Olp, PA-C   3 mL at 09/11/19 1835  . sodium chloride flush (NS) 0.9 % injection 3 mL  3 mL Intravenous PRN Marin Olp, PA-C      . sucralfate (CARAFATE) tablet 1 g  1 g Oral TID Marin Olp, PA-C      . tamsulosin [facility]) capsule 0.4 mg  0.4 mg Oral Daily Ferri, Amanda, PA-C      . traZODone (DESYREL) tablet 100 mg  100 mg Oral QHS Marin Olp, PA-C      . traZODone (DESYREL) tablet 50 mg  50 mg Oral QHS PRN Marin Olp, PA-C      . vancomycin (VANCOCIN) IVPB 1000 mg/200 mL premix  1,000 mg Intravenous Once Meade Maw, MD      . vancomycin (VANCOCIN) IVPB 750 mg/150 ml premix  750 mg Intravenous Q12H Pernell Dupre, [facility]   Stopped at 09/11/19 1738     Abtx:  Anti-infectives (From admission, onward)   Start     Dose/Rate Route Frequency Ordered Stop   09/11/19 1800  levofloxacin (LEVAQUIN) IVPB 750 mg     750 mg 100 mL/hr over 90 Minutes Intravenous Every 24 hours 09/11/19 1630     09/11/19 1500  vancomycin (VANCOCIN) IVPB 750 mg/150 ml premix     750 mg 150 mL/hr over 60 Minutes Intravenous Every 12 hours 09/11/19 1411     09/11/19 1430  ceFEPIme (MAXIPIME) 1 g in sodium chloride 0.9 % 100 mL IVPB  Status:  Discontinued     1 g 200 mL/hr over 30 Minutes Intravenous Every 8 hours 09/11/19 1236 09/11/19 1630   09/11/19 1245  vancomycin (VANCOCIN) IVPB 1000  mg/200 mL premix  Status:  Discontinued     1,000 mg 200 mL/hr over 60 Minutes Intravenous Every 12 hours 09/11/19 1236 09/11/19 1411   09/11/19 1059  vancomycin (VANCOCIN) powder  Status:  Discontinued       As needed 09/11/19 1100 09/11/19 1151   09/11/19 0834  50,000 units bacitracin in 0.9% normal saline 250 mL irrigation  Status:  Discontinued       As needed 09/11/19 0834 09/11/19 1151   09/11/19 0655  vancomycin (VANCOCIN) 1-5 GM/200ML-% IVPB    Note to Pharmacy: [place]: cabinet override      09/11/19 0655 09/11/19 1859   09/11/19 0655  ceFAZolin (ANCEF) 2-4 GM/100ML-% IVPB    Note to Pharmacy: [place]: cabinet override      09/11/19 0655 09/11/19 1859   09/11/19 0630  ceFAZolin (ANCEF) IVPB 2g/100 mL premix     2 g 200 mL/hr over 30 Minutes Intravenous On call to O.R. 09/11/19 5462 09/11/19 1123   09/11/19 0615  vancomycin (VANCOCIN) IVPB 1000 mg/200 mL premix     1,000 mg 200 mL/hr over 60 Minutes Intravenous  Once 09/11/19 0602        REVIEW OF SYSTEMS:  Const: negative fever, negative chills, negative weight loss Eyes: negative diplopia or visual changes, negative eye pain ENT: negative coryza, negative sore throat Resp: negative cough, hemoptysis, dyspnea Cards: negative for chest pain, palpitations, lower extremity edema GU: negative for frequency, dysuria and hematuria GI: Negative for abdominal pain, diarrhea, bleeding, constipation Skin: negative for rash and pruritus Heme: negative for easy bruising and gum/nose bleeding VO:JJKK legs, weakness ,  Neurolo:pain b/l legs, unable to walk a distance Psych: depression  Endocrine: no DM Allergy/Immunology- ? Ceftriaxone/? doxy Objective:  VITALS:  BP 116/73 (BP Location: Left Arm)   Pulse 85   Temp (!) 96.6 F (35.9 C) (Axillary)   Resp 18   Ht 5' 7.5" (1.715 m)   Wt 81.7 kg   SpO2 100%   BMI 27.79 kg/m  PHYSICAL EXAM:  Exam limited  Due to patient just back from surgery General:  tired post surgery, pale Head: Normocephalic, without obvious abnormality, atraumatic. Eyes: Conjunctivae clear, anicteric sclerae. Pupils are equal ENT Nares normal. No drainage or sinus tenderness. Lips, mucosa, and tongue normal. No Thrush Neck: did not examine Back:did not examine Lungs: b/l air entry front Heart: s1s2 Abdomen: Soft,  Extremities: did not examine Skin: No rashes or lesions. Or bruising Lymph: Cervical, supraclavicular normal. Neurologic: did not examine Pertinent Labs Lab Results CBC    Component Value Date/Time   WBC 5.9 09/05/2019 1500   RBC 4.46 09/05/2019 1500   HGB 12.8 (L) 09/05/2019 1500   HGB 14.0 03/01/2013 1905   HCT 38.9 (L)  09/05/2019 1500   HCT 41.1 03/01/2013 1905   PLT 131 (L) 09/05/2019 1500   PLT 127 (L) 03/01/2013 1905   MCV 87.2 09/05/2019 1500   MCV 89 03/01/2013 1905   MCH 28.7 09/05/2019 1500   MCHC 32.9 09/05/2019 1500   RDW 12.6 09/05/2019 1500   RDW 13.1 03/01/2013 1905   LYMPHSABS 1.0 07/10/2018 0546   MONOABS 0.7 07/10/2018 0546   EOSABS 0.1 07/10/2018 0546   BASOSABS 0.0 07/10/2018 0546    CMP Latest Ref Rng & Units 09/05/2019 07/12/2018 07/10/2018  Glucose 70 - 99 mg/dL 107(H) 114(H) 99  BUN 8 - 23 mg/dL 22 14 18   Creatinine 0.61 - 1.24 mg/dL 0.95 0.66 0.75  Sodium 135 - 145 mmol/L 137 136 134(L)  Potassium 3.5 - 5.1 mmol/L 3.9 4.1 4.8  Chloride 98 - 111 mmol/L 101 101 98  CO2 22 - 32 mmol/L 26 27 30   Calcium 8.9 - 10.3 mg/dL 9.7 8.7(L) 8.8(L)  Total Protein 6.5 - 8.1 g/dL - - -  Total Bilirubin 0.3 - 1.2 mg/dL - - -  Alkaline Phos 38 - 126 U/L - - -  AST 15 - 41 U/L - - -  ALT 0 - 44 U/L - - -      Microbiology: Recent Results (from the past 240 hour(s))  Surgical pcr screen     Status: None   Collection Time: 09/05/19  3:08 PM   Specimen: Nasal Mucosa; Nasal Swab  Result Value Ref Range Status   MRSA, PCR NEGATIVE NEGATIVE Final   Staphylococcus aureus NEGATIVE NEGATIVE Final    Comment: (NOTE) The Xpert  SA Assay (FDA approved for NASAL specimens in patients 73 years of age and older), is one component of a comprehensive surveillance program. It is not intended to diagnose infection nor to guide or monitor treatment. Performed at [facility], [place], [place] [address]   SARS CORONAVIRUS 2 (TAT 6-24 HRS) Nasopharyngeal Nasopharyngeal Swab     Status: None   Collection Time: 09/06/19 12:15 PM   Specimen: Nasopharyngeal Swab  Result Value Ref Range Status   SARS Coronavirus 2 NEGATIVE NEGATIVE Final    Comment: (NOTE) SARS-CoV-2 target nucleic acids are NOT DETECTED. The SARS-CoV-2 RNA is generally detectable in upper and lower respiratory specimens during the acute phase of infection. Negative results do not preclude SARS-CoV-2 infection, do not rule out co-infections with other pathogens, and should not be used as the sole basis for treatment or other patient management decisions. Negative results must be combined with clinical observations, patient history, and epidemiological information. The expected result is Negative. Fact Sheet for Patients: SugarRoll.be Fact Sheet for Healthcare Providers: https://www.woods-mathews.com/ This test is not yet approved or cleared by the [place] FDA and  has been authorized for detection and/or diagnosis of SARS-CoV-2 by FDA under an Emergency Use Authorization (EUA). This EUA will remain  in effect (meaning this test can be used) for the duration of the COVID-19 declaration under Section 56 4(b)(1) of the Act, 21 U.S.C. section 360bbb-3(b)(1), unless the authorization is terminated or revoked sooner. Performed at [facility], [place] [address] [place], [place] [address]     IMAGING RESULTS:  I have personally reviewed the films ? Impression/Recommendation ? Possible breach/contamination of the surgical field by hole in the drape of C arm- during lumbar  fusion surgery All hardware removed except for 2 fractured screws. No cultures sent from surgical site as there was no infection before  the surgery. Will give vanco and levaquin prophylaxis for 72 hrs. Will get a baseline ESR and CRp ( which may be increased due to the surgery) but can be repeated after 3 weeks. Depending on the patients progress- fusion surgery will be done later. At that time cultures can be taken from surgical site . Discussed the management with Dr.YArbrough and patient  H/o  L3-s1 fusion Dec 2018 for lumbar radiculopathy  H/o L2-L3 discectomy on 06/20/18 H/o proteus bacteremia and lumbar surgical wound infection 07/07/18 MRI questioned epidural collection  On the aspiration fluid done by IR culture negative - treated with IV initially and then PO amoxicillin.  Wound healed   Chronic thrombocytopenia  H/o possible ceftriaxone/Doxy allergy VS severe  herpes outbreak of the mouth in 2019. No pictures to look. Will not give cephalosporin  ? ___________________________________________________ Discussed with patient, requesting provider Note:  This document was prepared using Dragon voice recognition software and may include unintentional dictation errors.

## 2019-09-11 NOTE — Anesthesia Preprocedure Evaluation (Signed)
Anesthesia Evaluation  Patient identified by MRN, date of birth, ID band Patient awake    Reviewed: Allergy & Precautions, NPO status , Patient's Chart, lab work & pertinent test results, reviewed documented beta blocker date and time   Airway Mallampati: III  TM Distance: >3 FB     Dental  (+) Chipped   Pulmonary sleep apnea ,           Cardiovascular hypertension, Pt. on medications      Neuro/Psych PSYCHIATRIC DISORDERS Anxiety Depression  Neuromuscular disease    GI/Hepatic GERD  Controlled,  Endo/Other    Renal/GU      Musculoskeletal  (+) Arthritis ,   Abdominal   Peds  Hematology  (+) anemia ,   Anesthesia Other Findings   Reproductive/Obstetrics                             Anesthesia Physical Anesthesia Plan  ASA: III  Anesthesia Plan: General   Post-op Pain Management:    Induction: Intravenous  PONV Risk Score and Plan:   Airway Management Planned: Oral ETT  Additional Equipment:   Intra-op Plan:   Post-operative Plan:   Informed Consent: I have reviewed the patients History and Physical, chart, labs and discussed the procedure including the risks, benefits and alternatives for the proposed anesthesia with the patient or authorized representative who has indicated his/her understanding and acceptance.       Plan Discussed with: CRNA  Anesthesia Plan Comments:         Anesthesia Quick Evaluation

## 2019-09-11 NOTE — Op Note (Signed)
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    Findings: successful L2-3 XLIF.   Preoperative Diagnosis: M40.209 (site unspecified, kyphosis) Postoperative Diagnosis: same   EBL: 300 ml IVF: 1000 ml Drains: 2 placed posteriorly Disposition: Extubated and Stable to PACU Complications: none  A foley catheter was placed at the end of the procedure.   Preoperative Note:   Risks of surgery discussed include: infection, bleeding, stroke, coma, death, paralysis, CSF leak, nerve/spinal cord injury, numbness, tingling, weakness, complex regional pain syndrome, recurrent stenosis and/or disc herniation, vascular injury, development of instability, neck/back pain, need for further surgery, persistent symptoms, development of deformity, and the risks of anesthesia. The patient understood these risks and agreed to proceed.  NAME OF ANTERIOR PROCEDURE:               1. Anterior lumbar interbody fusion via a right lateral retroperitoneal approach at L2/3 2. Placement of a Lordotic Modulus  10x22x50 interbody cage, filled with Demineralized Bone Matrix 3. Bone Marrow Aspiration via a separate incision   NAME OF POSTERIOR PROCEDURE: 1. Aborted posterior instrumentation and fusion using  Nuvasive Reline Instrumentation       PROCEDURE:  Patient was brought to the operating room, intubated, turned to the prone position.  All pressure points were checked and double-checked.  The patient was prepped and draped in the standard fashion. Prior to prepping, fluoroscopy was brought in and the patient was positioned allowing the area between the iliac crest and the lateral aspect of the rib cage to open and increase the ability to reach inferiorly, to facilitate entry into the disc space.  The incision was marked upon the skin both the location of the disc space as well as the superior most aspect of the iliac crest.  Based on the identification of the disc space an  incision was prepared, marked upon the skin and eventually was used for our lateral incision.  The fluoroscopy was turned into A/P image in order to confirm that the patient's spine remained in a perpendicular trajectory to the floor without rotation.  Once confirming that all the pressure points were checked and double-checked and the patient remained in sturdy position strapped down, the patient was prepped and draped in standard fashion.  The skin was injected with local anesthetic, then incised until the abdominal wall fascia was noted.  I bluntly dissected posteriorly until we were able to identify the posterior musculature near petit's triangle.  At this point, using primarily blunt dissection with our finger aided with a metzenbaum scissor, were able to enter the retroperitoneal cavity.  The retroperitoneal potential space was opened further until palpating out the psoas muscle, the medial aspect of the iliac crest, the medial aspect of the last rib and continued to define the retroperitoneal space with blunt dissection in order to facilitate safe placement of our dilators.    While protecting by dissecting directly onto a finger in the retroperitoneum, the retroperitoneal space was entered safely from the lateral incision and the initial dilator placed onto the muscle belly of the psoas.  While directly stimulating the dilator and after radiographically confirming our location relative to the disc space, I placed the dilator through the psoas.  The dilators were stimulated to ensure remaining safely away from any of the lumbar plexus nerves; the dilators were repositioned until no pathologic stimulation was appreciated.  Once I had confirmed the location of our initial dilator radiographically, a K-wire secured the dilator into the L2/3 disc  space and confirmed position under A/P and lateral fluoroscopy.  At this point, I dilated up with direct stimulation to confirm lack of pathologic  stimulation.  Once all the dilators were in position, I placed in the retractor and secured it onto the table, locked into position and confirmed under A/P and lateral fluoroscopy to confirm our approach angle to the disc space as well as location relative to the disc space.  I then placed the muscle stimulator in through the working channel down to the vertebral body, stimulating the entire lateral surface of the vertebral body and any of the visualized psoas muscle that was adjacent to the retractor, confirming again the safe passage to the psoas before we began performing the discectomy.  At this point, we began our discectomy at L2/3.  The disc was incised laterally throughout the extent of our exposure. Using a combination of pituitary rongeurs, Kerrison rongeurs, rasps, curettes of various sorts, we were able to begin to clean out the disc space.  Once we had cleaned out the majority of the disc space, we then cut the lateral annulus with a cob, breaking the lateral annual attachments on the contralateral side by subtly working the cob through the annulus while using flouroscopy.  Care was taken not to extend further than required after cutting the annular attachments.  After this had been performed, we prepared the endplates for placement of our graft, sized a graft to the disc space by serially dilating up in trial sizes until we confirmed that our graft would be well positioned, allowing distraction while maintaining good grip.  This was confirmed under A/P and lateral fluoroscopy in order to ensure its placement as an eventual trial for placement of our final graft.  We irrigated with bacteriostatic saline.  Once confirmed placement, the Modulus implant filled with allograft was impacted into position at L2/3.   Through a combination of intradiscal distraction and anterior releasing, we were able to correct the anterior deformity during disc preparation and placement of the graft.  At this point,  final radiographs were performed, and we began closure.  We began the posterior portion of the procedure prior to closure.     The prior incisions were opened and the soft tissues divided until the prior implants were visible. On the right, the implants were removed without difficulty.  On the left, the S1 screw only partially came out due to a screw fracture that was not apparent on the CT scan.  The sizes were noted, and the L3-S1 screws on the right and L3-5 screws on the left were replaced.  We then used flouroscopy to place new screws at L2. The pedicles were marked using true AP flouroscopy, adjusting the angle for good visualization at L2. Using en fosse visualization of the pedicles at L2, a Jamsheedi needle was used to cannulate the pedicle bilaterally. Direct stimulation was used on the needle without any low (<15 mAmp) stimulation thresholds. The needle was carefully removed and the pedicle finder was placed through the tract to widen the tract.  A 6.5x45 mm screw was then placed on the left.  The same procedure was then performed on the right side.  At this point, we shaped a rod for the right and began inserting the rod.   During insertion of the rod on the right, a possible breach in the flouroscopic drape was noted.  This was carefully inspected but we were unable to confirm that the drape was intact.  Because of the  uncertainty around when this occurred, I decided to remove all the implants to decrease the chance of infection.    During removal of the implants, the R S1 screw shaft fractured at the bone level.  I tried multiple avenues to remove the shaft, but could not remove the screw shaft.  The remainder of the screws were removed without incident.  The posterior incisions were irrigated.  Vancomycin powder was placed into the wounds.    At this point, we changed gloves and redraped the incisions.  We moved to close the lateral incision. The lateral wound was closed using 0 Vicryl  interrupted suture in the fascia and 2-0 Vicryl inverted suture were placed in the subcutaneous tissue and dermis. 3-0 monocryl was used for final closure. Dermabond was used to close the skin.    We then placed drains on the posterior incisions sites below the fascia. The posterior wound was closed in layers and staples were used on the skin.  The drains were secured at the skin.  Needle, lap and all counts were correct at the end of the case.     Marin Olp PA assisted in the entire procedure.  Meade Maw MD Neurosurgery  Screw sizes for replacement:  B L2 6.5x45 B L3 7.5x50 B L4 7.5x45 B L5 7.5x45

## 2019-09-11 NOTE — H&P (Signed)
History of Present Illness: 09/11/2019 [name] reports today with increasing symptoms of back pain and inability to stand for any length of time.  He feels his symptoms have worsened since his last clinic visit.  08/01/2019  [name] continues to have symptoms of severe back pain. He went to see Dr. Holley Raring, who did not feel that he was a candidate for spinal cord stimulation due to his history of infection. Thus he comes back today to discuss surgery.  06/13/2019 [name] returns to see me today. He is here to review the discussion from indications conference. 06/06/2019 [name] continues to have symptoms as noted below. He is unable to stand for too long a period of time. He is here to review imaging.  05/09/2019 [name] returns to see me. He has had 2 falls since he last saw me. He saw Marin Olp, my PA and was ultimately referred for bilateral L2-3 facet joint injections. This did help for approximately a week, but he has had worsening pain since that time. He now has significant pain whenever he stands for any length of time. He finds himself bending forward at times critically when he tries to perform household tasks such as doing his laundry. He does not feel that he is ready to return to work.  He does describe some radicular type pain into his left flank worse than his right side. He continues to make some improvements in his leg strength. His falls both occurred when he was not wearing his brace.  01/31/2019 [name] returns to see me. He is continue to have improvement in his left leg strength. He is now walking without his cane and without his brace at times at home. He continues to have some weakness, and is working on therapy to improve his strength. He does have some back pain after he stands for long period of time. He also reports that he has new discomfort down his left arm into his third and fourth finger particular when he turns his head to the right. He has no weakness, dexterity  changes, or trips and falls related to this.  11/22/2018 [name] has continued to improve since last time I saw him. He is walking with a walker and a knee extension brace. He is living independently at home without help. He is able to drive. He is not ready to return to work.  09/06/2018 [name] has improved significantly since last time I saw him. He is now able to bear weight at times. He is walking with assistance. He has had significant improvement in the nerve function of his quadriceps and iliopsoas muscles. He is now living independently without help.  07/26/2018 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 He has had a very difficult postoperative course comp located by weakness of both legs left greater than right. He is in a wheelchair due to weakness in his iliopsoas and quadriceps muscles.  During his time at rehab, he became bacteremic and likely had a urinary tract infection that ultimately seeded his operative bed. He is on antibiotics for this. He is being treated with amoxicillin.  06/13/2018 Confirmed the key details from below. [name] was doing very well from the surgery till approximately 6 weeks ago. He was working in the yard and began having back pain subsequently had bilateral buttock pain. That has slightly abated since then, but  he continues to have worsening gait. He fell 5 times yesterday. He has had worsening leg weakness over time.  He describes pain in his buttocks but not below that. He has some discomfort in his posterior thighs. He mostly has weakness.  05/01/2018 from [place] note: Trevor [name] is a 73 y.o. male with history of L3-5 XLIF, L5-S1 TLIF, L3-S1 PLIF on 11/20/2017 who presents with the chief complaint of bilateral buttock and posterior thigh pain for 2 weeks after completing a day of strenuous yard work. Symptoms seem to be worsening and  has resulted in inability to work due to pain. Symptoms aggravated by sitting and laying down; relieved with standing, walking, and lumbar flexion. Has noticed he is ambulating in more "stooped" position. Describes symptoms as sharp pain in mid buttock bilaterally with radiation into the posterior aspect of thighs. Accompanied by leg weakness. Pain seems to be equal in laterality.  Currently taking aleve and half of a pain medication when needed for pain control.  Denies bladder/bowel dysfunction, saddle paresthesia, symptoms extending beyond knees, recent falls or trauma.   Review of Systems:  A 10 point review of systems is negative, except for the pertinent positives and negatives detailed in the HPI.  Past Medical History: Past Medical History:  Diagnosis Date  . Acne  . Allergic rhinitis  . Anemia  . Anxiety  . Carpal tunnel syndrome  . Cervicalgia  . 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  . Depression  . Gastritis  . GERD (gastroesophageal reflux disease)  . Hemorrhoids  . History of motion sickness  . Hyperlipidemia  . Hypertension  . Low back pain  . Moderate mitral regurgitation  LVH; diastolic dysfunction by echo 10/08.  Marland Kitchen Ringworm 2019  . Sleep apnea  cpap  . 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 Yves Dill.   Past Surgical History: Past Surgical History:  Procedure Laterality Date  . ADENOIDECTOMY  . ANTERIOR THORACIC SPINE FUSION MULTIPLE LEVELS N/A 11/20/2017  Procedure: ARTHRODESIS, ANTERIOR INTERBODY TECHNIQUE,; Surgeon: Ricard Dillon, MD; Location: [facility]; Service: Neurosurgery; Laterality: N/A;  . ARTHRODESIS ANTERIOR CERVICLE SPINE N/A 11/20/2017  Procedure: ARTHRODESIS, ANTERIOR INTERBODY ); Surgeon: Ricard Dillon, MD; Location: [facility]; Service: Neurosurgery; Laterality: N/A;  . bladder tumor  . COLONOSCOPY 03/02/2005  Int Hemorrhoids: CBF  02/2015; Recall Ltr mailed 01/07/2015 (dw)  . COLONOSCOPY 07/31/2015  Int Hemorrhoids: CBF 07/2025  . EGD 05/13/1993  . EGD 03/02/2005  . INSTRUMENTATION POSTERIOR SPINE 3 TO 6 VERTEBRAL SEGMENTS N/A 11/20/2017  Procedure: POSTERIOR SEGMENTAL INSTRUMENTATION; Surgeon: Ricard Dillon, MD; Location: [place]; Service: Neurosurgery; Laterality: N/A;  . L2-3 microdiscectomy 06/20/2018  Dr Meade Maw at [facility]  . LUMB SPINE FUSION COMBINED N/A 11/20/2017  Procedure: ARTHRODESIS,; Surgeon: Ricard Dillon, MD; Location: [facility]; Service: Neurosurgery; Laterality: N/A;  . MICROSURGERY N/A 11/20/2017  Procedure: MICROSCOPE; Surgeon: Ricard Dillon, MD; Location: [place]; Service: Neurosurgery; Laterality: N/A;  . SEPTOPLASTY  February 1996  . SIGMOIDOSCOPY FLEXIBLE 06/23/1993  . TONSILLECTOMY   Allergies:  Allergies  Allergen Reactions  . Ceftriaxone Hives    Medications:  Current Outpatient Medications:   Current Meds  Medication Sig  . acetaminophen (TYLENOL) 650 MG CR tablet Take 650 mg by mouth every 8 (eight) hours.  . Apple Cid Vn-Grn Tea-Bit Or-Cr (APPLE CIDER VINEGAR PLUS PO) Take 1 tablet by mouth 2 (two) times daily.  . ASPIRIN 81 PO Take 81  mg by mouth daily.  . Cyanocobalamin (B-12) 5000 MCG CAPS Take 5,000 mcg by mouth daily.  Marland Kitchen docusate sodium (COLACE) 100 MG capsule Take 100 mg by mouth daily.  . fluticasone (FLONASE) 50 MCG/ACT nasal spray Place 2 sprays into both nostrils daily.  Marland Kitchen lisinopril-hydrochlorothiazide (ZESTORETIC) 10-12.5 MG tablet Take 0.5 tablets by mouth daily.   . methocarbamol (ROBAXIN) 500 MG tablet Take 500 mg by mouth 2 (two) times daily.  . Multiple Vitamin (MULTIVITAMIN WITH MINERALS) TABS tablet Take 1 tablet by mouth daily.  . naproxen sodium (ALEVE) 220 MG tablet Take 220 mg by mouth 3 (three) times daily.  . Omega-3 300 MG CAPS Take 600 mg by mouth daily.  Marland Kitchen omeprazole (PRILOSEC) 10 MG capsule Take 10 mg by  mouth daily.  Marland Kitchen OVER THE COUNTER MEDICATION Take 1 tablet by mouth at bedtime as needed (constipation). PRUNEX   . simvastatin (ZOCOR) 40 MG tablet Take 40 mg by mouth daily.  . sucralfate (CARAFATE) 1 G tablet Take 1 g by mouth 3 (three) times daily.   . tamsulosin (FLOMAX) 0.4 MG CAPS capsule TAKE 1 CAPSULE DAILY (Patient taking differently: Take 0.4 mg by mouth daily. )  . traZODone (DESYREL) 100 MG tablet Take 100 mg by mouth at bedtime.    Social History: Social History   Tobacco Use  . Smoking status: Never Smoker  . Smokeless tobacco: Never Used  Substance Use Topics  . Alcohol use: No  Alcohol/week: 0.0 standard drinks  . Drug use: Never   Family Medical History: Family History  Problem Relation Age of Onset  . Myocardial Infarction (Heart attack) Father   Physical Examination:  Vitals:   09/11/19 0627  BP: (!) 150/95  Pulse: 92  Resp: 12  Temp: 98.7 F (37.1 C)  SpO2: 97%   Heart sounds normal no MRG. Chest Clear to Auscultation Bilaterally.   Gen. appearance: Patient is well-developed, well-nourished, in no apparent distress. Alert and cooperative.   Psychiatric: Patient is non-anxious, normal affect  NEUROLOGIC EXAM: Mental status: Alert and orientation to person, place, and time. Speech: fluent and clear  Strength: Side Biceps Triceps Deltoid Interossei Grip Wrist Ext. Wrist Flex.  R 5 5 5 5 5 5 5   L 5 5 5 5 5 5 5    Side Iliopsoas Quads Hamstring Plantar Flexion Dorsiflexion Extensor Hallicus Longus  R 5 5 5 5 5 5   L 4 4 5 5 5 5    Incisions c/d/i  Medical Decision Making  Imaging: L spine xrays 11/22/2018 - no obvious complication noted L spine xrays 05/09/2019 - increase in Cobb angle of L1-3 to 22 degrees from 16 degrees (and 11 degrees prior to that)  MRI L spine 07/10/2018 IMPRESSION: 1. Fluid collection from subcutaneous space to the epidural space, sterility indeterminate in the setting of dehiscence. 2. Epidural enhancement at L2-3  level could be postoperative inflammation or infectious phlegmon. 3. Radiculitis with bilateral enhancing nerve roots centered at the postoperative level. 4. 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 Previously seen disc herniation has been decompressed but there is still severe spinal stenosis from posterior element hypertrophy, residual disc material, and the epidural soft tissue thickening.  Electronically Signed By: Avelina Laine M.D. On: 07/10/2018 09:47  MRI L spine 05/30/2019 IMPRESSION: 1. Postoperative changes with fusion hardware from L3-S1 without obvious complicating features. 2. Stable appearing spinal and bilateral lateral recess stenosis at L3-4 and L4-5. 3. Progressive degenerative disc disease at L2-3 with marked endplate reactive changes. There is a central recurrent  disc protrusion with mass effect on the thecal sac contributing to mild spinal and moderate bilateral lateral recess stenosis.  Electronically Signed By: Marijo Sanes M.D. On: 05/30/2019 08:53  CT L spine 05/30/2019 IMPRESSION: 1. Postoperative changes with L3-S1 fusion hardware. 2. Moderate spinal and bilateral lateral recess stenosis at L2-3. 3. No solid interbody fusion changes at L3-4. 4. Moderately severe spinal and bilateral lateral recess stenosis at L4-5. This does not appear to be as significant on today's MRI. 5. Moderate left foraminal stenosis at L5-S1. 6. No acute bony findings.  Electronically Signed By: Marijo Sanes M.D. On: 05/30/2019 08:44  Assessment and Plan: [name] is a pleasant 73 y.o. 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   He has asymmetric collapse on the left side of the disc space at L2-3 that is clearly worse than it was in his postoperative x-rays.  He has  failed conservative management and now presents for surgical intervention.  We will proceed with L2-3 XLIF and revision of posterior instrumentation.  Meade Maw MD, [facility] Department of Neurosurgery

## 2019-09-11 NOTE — OR Nursing (Signed)
Explanted 8 screws, 8 caps, and 2 rods from back

## 2019-09-11 NOTE — Anesthesia Post-op Follow-up Note (Signed)
Anesthesia QCDR form completed.        

## 2019-09-11 NOTE — Transfer of Care (Signed)
Immediate Anesthesia Transfer of Care Note  Patient: Trevor [name]  Procedure(s) Performed: PRONE XLIF L2-3 (N/A Back) L2-S1 POSTERIOR FUSION, ABORTED (N/A Back)  Patient Location: PACU  Anesthesia Type:General  Level of Consciousness: awake  Airway & Oxygen Therapy: Patient Spontanous Breathing and Patient connected to face mask oxygen  Post-op Assessment: Report given to RN and Post -op Vital signs reviewed and stable  Post vital signs: Reviewed  Last Vitals:  Vitals Value Taken Time  BP 124/92 09/11/19 1157  Temp    Pulse 85 09/11/19 1159  Resp 18 09/11/19 1157  SpO2 100 % 09/11/19 1159  Vitals shown include unvalidated device data.  Last Pain:  Vitals:   09/11/19 0627  TempSrc: Tympanic  PainSc:          Complications: No apparent anesthesia complications

## 2019-09-12 ENCOUNTER — Inpatient Hospital Stay: Payer: Medicare Other

## 2019-09-12 LAB — CREATININE, SERUM
Creatinine, Ser: 0.89 mg/dL (ref 0.61–1.24)
GFR calc Af Amer: >60 mL/min (ref >60)
GFR calc non Af Amer: >60 mL/min (ref >60)

## 2019-09-12 LAB — SEDIMENTATION RATE: Sed Rate: 30 mm/hr — ABNORMAL HIGH (ref 0–20)

## 2019-09-12 LAB — C-REACTIVE PROTEIN: CRP: 4.6 mg/dL — ABNORMAL HIGH (ref <1.0)

## 2019-09-12 MED ORDER — VANCOMYCIN HCL 10 G IV SOLR
1750.0000 mg | INTRAVENOUS | Status: DC
  Filled 2019-09-12: qty 1750

## 2019-09-12 MED ORDER — CHLORHEXIDINE GLUCONATE CLOTH 2 % EX PADS
6.0000 | MEDICATED_PAD | Freq: Every day | CUTANEOUS | Status: DC
  Administered 2019-09-13 – 2019-09-15 (×3): 6 via TOPICAL

## 2019-09-12 MED ORDER — DIAZEPAM 5 MG PO TABS
5.0000 mg | ORAL_TABLET | Freq: Once | ORAL | Status: AC
  Administered 2019-09-12: 5 mg via ORAL
  Filled 2019-09-12: qty 1

## 2019-09-12 NOTE — Evaluation (Signed)
Occupational Therapy Evaluation Patient Details Name: Trevor [name] MRN: [id number] DOB: [birth year] Today's Date: 09/12/2019    History of Present Illness Trevor [name] is a 73 y.o. male admitted for L2-3 XLIF and revision of posterior instrumentation. As per Dr. Etheleen Mayhew note, this procedure was complicated by a possible breach in the surgical field due to a hole identified in the drape of the C arm. All the hardware was removed except two screws which fractured. Pt is now POD #1 and being followed by infectious disease for antibiotic management. 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   Clinical Impression   Trevor [name] was seen for OT evaluation this date, POD#1 from above proceedure. Prior to hospital admission, pt was independent with mobility, ADL, and IADL. He reports 1 fall in past 12 months which he attributes to his legs buckling as he was leaving his home. Pt has significant history of lumbar surgery. He reports that after one of his surgeries he woke up with both of his legs unable to move. He has since gone through rehab for BLE, and reports he is generally independent, but does require a SPC for community distances as well as a "brace" to prevent his LLE from buckling. Of late, [name], has been having increased difficulty with all aspects of mobility and ADL due to back pain. Pt lives with alone in a single family home with a ramped entrance. Pt has friends and family available to provide assistance PRN/intermittently. He is currently limited by hypotension with positional changes and has been unable to attempt functional mobility. Once BP is controlled, suspect pt will require minimal assistance with ADL mgt including lower body dressing and bathing tasks. Will continue to assess functional mobility and ADL mgt at future sessions.   Pt educated in back precautions  with handout provided, self care skills, bed mobility and functional transfer training, and falls prevention strategies to maximize safety and functional independence while minimizing falls risk and maintaining precautions. Pt verbalized understanding of all education/training provided. Handout provided to support recall and carry over of learned precautions/techniques for bed mobility, functional transfers, and self care skills. Pt would benefit from skilled OT to address noted impairments and functional limitations (see below for any additional details) in order to maximize safety and independence while minimizing falls risk and caregiver burden.  Upon hospital discharge, recommend HHOT to maximize pt safety and functional independence during meaningful occupations of daily life.      Follow Up Recommendations  Home health OT    Equipment Recommendations  3 in 1 bedside commode    Recommendations for Other Services       Precautions / Restrictions Precautions Precautions: Back Precaution Booklet Issued: Yes (comment) Precaution Comments: No bending, arching, twisting Restrictions Weight Bearing Restrictions: No      Mobility Bed Mobility Overal bed mobility: Needs Assistance Bed Mobility: Rolling;Sidelying to Sit;Sit to Sidelying Rolling: Min assist;Min guard Sidelying to sit: Supervision;Min guard     Sit to sidelying: Supervision;Min guard General bed mobility comments: Pt requires min assist when rolling in bed this date. Primarily to support coming onto side with pain as the primary limiting factor. Pt is able to complete log-roll technique to complete sit<>sidelying with supervision/CGA for mgt of lines and leads.  Transfers                 General transfer comment: Functional transfers deferred for  pt safety. Pt became "swimmy headed" with abdominal pain and nausea when sitting EOB. Returned to supine position.    Balance Overall balance assessment: Needs  assistance Sitting-balance support: Feet supported;No upper extremity supported Sitting balance-Leahy Scale: Fair Sitting balance - Comments: CGA for safety during static sitting EOB.                                   ADL either performed or assessed with clinical judgement   ADL Overall ADL's : Needs assistance/impaired                                       General ADL Comments: Pt currently limited by hypotension. Has not been able to tolerate sitting up at EOB for extended amount of time. Begins to feel "swimmy headed" Pt noted to become hypotensive with positional change this date. 2/2 hypotension is bed level for all ADL mgt at time of assessment. As BP improves suspect pt req. min assist for LB dressing and bathing tasks 2/2 increased pain and back precautions. Handout provided to support ADL mgt with spinal precautions.     Vision         Perception     Praxis      Pertinent Vitals/Pain Pain Assessment: 0-10 Pain Score: 7  Pain Location: Pt endorses 6/10 back pain at rest. When seated, pain in abdomen increases to 7/10. Pain Descriptors / Indicators: Sore;Constant;Cramping;Grimacing;Guarding Pain Intervention(s): Limited activity within patient's tolerance;Monitored during session;Premedicated before session;Repositioned     Hand Dominance Right   Extremity/Trunk Assessment Upper Extremity Assessment Upper Extremity Assessment: Overall WFL for tasks assessed(Pt endorses some RUE weakness and tingling which is baseline. ROM/[facility].)   Lower Extremity Assessment Lower Extremity Assessment: Generalized weakness;Defer to PT evaluation       Communication Communication Communication: No difficulties   Cognition Arousal/Alertness: Awake/alert Behavior During Therapy: WFL for tasks assessed/performed Overall Cognitive Status: Within Functional Limits for tasks assessed                                     General  Comments  Pt BP monitored during session with readings as follows: Sidelying at start of session: 119/76; Sitting EOB: 103/59 with pt reporting mild dizziness/"swimmy headedness"; Supine in bed at end of session with symptoms resolved: 149/70.    Exercises Other Exercises Other Exercises: Pt educated in back precautions including no bending, arching, twisting as well as bed mobility techniques to support safety and pain management this date.   Shoulder Instructions      Home Living Family/patient expects to be discharged to:: Private residence Living Arrangements: Alone Available Help at Discharge: Family;Friend(s);Available PRN/intermittently Type of Home: House Home Access: Ramped entrance     Home Layout: One level     Bathroom Shower/Tub: Tub/shower unit;Curtain   Bathroom Toilet: Standard Bathroom Accessibility: Yes   Home Equipment: Environmental consultant - 4 wheels;Wheelchair - manual;Hand held Veterinary surgeon - single point          Prior Functioning/Environment Level of Independence: Independent with assistive device(s)        Comments: Pt reports being active and independent. Recovered well from prior surgeries. Driving; community ambulator; using a [place] for community distances as well as a knee brace to prevent LLE  knee buckling.        OT Problem List: Decreased strength;Decreased coordination;Pain;Decreased range of motion;Cardiopulmonary status limiting activity;Decreased activity tolerance;Decreased safety awareness;Impaired balance (sitting and/or standing);Decreased knowledge of use of DME or AE;Decreased knowledge of precautions;Impaired sensation      OT Treatment/Interventions: Self-care/ADL training;Balance training;Therapeutic exercise;Therapeutic activities;DME and/or AE instruction;Patient/family education    OT Goals(Current goals can be found in the care plan section) Acute Rehab OT Goals Patient Stated Goal: To have less pain in my back OT Goal Formulation:  With patient Time For Goal Achievement: 09/26/19 Potential to Achieve Goals: Good  OT Frequency: Min 1X/week   Barriers to D/C: Decreased caregiver support          Co-evaluation PT/OT/SLP Co-Evaluation/Treatment: Yes Reason for Co-Treatment: Complexity of the patient's impairments (multi-system involvement);For patient/therapist safety;To address functional/ADL transfers PT goals addressed during session: Mobility/safety with mobility;Balance;Strengthening/ROM OT goals addressed during session: ADL's and self-care;Strengthening/ROM      AM-PAC OT "6 Clicks" Daily Activity     Outcome Measure Help from another person eating meals?: None Help from another person taking care of personal grooming?: A Little Help from another person toileting, which includes using toliet, bedpan, or urinal?: A Lot Help from another person bathing (including washing, rinsing, drying)?: A Little Help from another person to put on and taking off regular upper body clothing?: A Little Help from another person to put on and taking off regular lower body clothing?: A Lot 6 Click Score: 17   End of Session Nurse Communication: Other (comment);Mobility status(Pt BP during session.)  Activity Tolerance: Treatment limited secondary to medical complications (Comment)(hypotension) Patient left: in bed;with call bell/phone within reach;with bed alarm set;with SCD's reapplied  OT Visit Diagnosis: Other abnormalities of gait and mobility (R26.89);Pain Pain - Right/Left: (Back) Pain - part of body: (Back)                Time: [id number] OT Time Calculation (min): 35 min Charges:  OT General Charges $OT Visit: 1 Visit OT Evaluation $OT Eval Moderate Complexity: 1 Mod OT Treatments $Self Care/Home Management : 8-22 mins  Shara Blazing, M.S., OTR/L Ascom: [phone] 09/12/19, 3:39 PM

## 2019-09-12 NOTE — Progress Notes (Signed)
PT Cancellation Note  Patient Details Name: Trevor [name] MRN: [id number] DOB: 21-Mar-1946   Cancelled Treatment:    Reason Eval/Treat Not Completed: Other (comment).  PT consult received.  Chart reviewed.  Nurse contacted MD Izora Ribas and reports recommendation to hold PT this morning.  Will re-attempt PT evaluation this afternoon.  Leitha Bleak, PT 09/12/19, 11:26 AM [phone]

## 2019-09-12 NOTE — TOC Initial Note (Signed)
Transition of Care [facility]) - Initial/Assessment Note    Patient Details  Name: Trevor [name] MRN: [id number] Date of Birth: 1946-10-22  [facility]) CM/SW Contact:    Su Hilt, RN Phone Number: 09/12/2019, 4:02 PM  Clinical Narrative:                  Met with the patient to discuss DC plan and needs  He lives alone but has family and friends that help anytime he needs it  He has a ramped entrance and a wheelchair at home, has 3 rolling walkers and a rolator, has a shower seat and grab bars, states he does not need a BSC or raised toilet  He is already set up with [facility] and they have already called him to verify He plans to DC home on Sat  His brother will provide transportation He can afford his medications  He states that he does really well at home doing his ADLs and can maneuver really well with huis current DME  I will continue to Monitor for additional needs  Expected Discharge Plan: [place] Barriers to Discharge: Continued Medical Work up   Patient Goals and CMS Choice Patient states their goals for this hospitalization and ongoing recovery are:: go home      Expected Discharge Plan and Services Expected Discharge Plan: [place]   Discharge Planning Services: CM Consult   Living arrangements for the past 2 months: Single Family Home                 DME Arranged: N/A         HH Arranged: PT, OT, Nurse's Aide HH Agency: [facility] Date HH Agency Contacted: 09/12/19 Time HH Agency Contacted: 47 Representative spoke with at [facility]: Blanchard Mane  Prior Living Arrangements/Services Living arrangements for the past 2 months: Single Family Home Lives with:: Self Patient language and need for interpreter reviewed:: Yes Do you feel safe going back to the place where you live?: Yes      Need for Family Participation in Patient Care: No (Comment) Care giver support system in place?:  Yes (comment) Current home services: DME(ramp, wheelchair, RW X 3, Rolator, SHower seat, cane) Criminal Activity/Legal Involvement Pertinent to Current Situation/Hospitalization: No - Comment as needed  Activities of Daily Living Home Assistive Devices/Equipment: Eyeglasses, Cane (specify quad or straight), Wheelchair, Brace (specify type) ADL Screening (condition at time of admission) Patient's cognitive ability adequate to safely complete daily activities?: Yes Is the patient deaf or have difficulty hearing?: No Does the patient have difficulty seeing, even when wearing glasses/contacts?: No Does the patient have difficulty concentrating, remembering, or making decisions?: Yes(at times) Patient able to express need for assistance with ADLs?: Yes Does the patient have difficulty dressing or bathing?: No Independently performs ADLs?: Yes (appropriate for developmental age) Does the patient have difficulty walking or climbing stairs?: Yes Weakness of Legs: Both Weakness of Arms/Hands: Both  Permission Sought/Granted   Permission granted to share information with : Yes, Verbal Permission Granted              Emotional Assessment Appearance:: Appears stated age Attitude/Demeanor/Rapport: Engaged Affect (typically observed): Appropriate, Calm Orientation: : Oriented to Self, Oriented to Place, Oriented to  Time, Oriented to Situation Alcohol / Substance Use: Not Applicable Psych Involvement: No (comment)  Admission diagnosis:  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  S/P lumbar fusion 09/11/2019  . Bacteremia 07/13/2018  . Herpes stomatitis 07/12/2018  . Postoperative wound infection 07/12/2018  . Lumbar surgical wound fluid collection 07/12/2018  . Wound dehiscence, surgical   . Postoperative pain   . Neurogenic bowel   . Thrombocytopenia (HCC)   . Acute blood loss anemia   . Sleep disturbance   . Essential  hypertension   . Steroid-induced hyperglycemia   . Prediabetes   . Leukocytosis   . Herniation of lumbar intervertebral disc with radiculopathy 06/22/2018  . Lumbar radiculopathy 06/20/2018  . Nocturia 10/23/2015  . Absolute anemia 05/13/2014  . Benign essential HTN 05/13/2014  . Blood glucose elevated 05/13/2014  . LBP (low back pain) 05/13/2014  . HLD (hyperlipidemia) 05/13/2014  . Malignant neoplasm of lateral wall of urinary bladder (HCC) 09/19/2013  . Benign prostatic hypertrophy without urinary obstruction 12/10/2012  . Hypospadias 12/10/2012  . Microscopic hematuria 12/10/2012  . History of neoplasm of bladder 12/10/2012  . FOM (frequency of micturition) 12/10/2012   PCP:  Babaoff, Marcus, MD Pharmacy:   [facility] - [place], [place] - [address] [place] [address] Phone: [phone] Fax: [phone]  [facility] - [place], [place] - [address] [place] [address] Phone: [phone] Fax: [phone]     Social Determinants of Health (SDOH) Interventions    Readmission Risk Interventions No flowsheet data found.  

## 2019-09-12 NOTE — Consult Note (Signed)
Pharmacy Antibiotic Note  Trevor [name] is a 73 y.o. male admitted on 09/11/2019 following planned surgical procedure.  Pharmacy has been consulted for vancomycin dosing for possible OR contamination.   Spoke with SDS nurse who confirmed that patient did received Vancomycin 1000mg  IV x 1 dose around 0715 on 9/30. Dose was not documented on MAR but there is a cabinet override for Vancomycin 1g IV.   Plan: Patient received Vancomycin 750 mg IV Q 12 hrs x 3 doses after a 2g load, will update dosing with today's Scr of 0.89 (was 0.95)  Vancomycin 1750 mg IV Q 24 hrs. Goal AUC 400-550. Expected AUC: 474, estimated Cmin 9.6 Cmax 38.2  SCr used: 0.89  Pharmacy will continue to monitor and adjust dose as needed.   Height: 5' 7.5" (171.5 cm) Weight: 180 lb 1.9 oz (81.7 kg) IBW/kg (Calculated) : 67.25  Temp (24hrs), Avg:97.9 F (36.6 C), Min:96.4 F (35.8 C), Max:98.8 F (37.1 C)  Recent Labs  Lab 09/05/19 1500 09/12/19 0454  WBC 5.9  --   CREATININE 0.95 0.89    Estimated Creatinine Clearance: 76.4 mL/min (by C-G formula based on SCr of 0.89 mg/dL).    Allergies  Allergen Reactions  . Ceftriaxone Hives    Blisters and Hives around mouth and neck.  Some blistering of lips.  Mouth not effected.  Patient started on ceftriaxone and doxycyline 1-2 days prior to reaction.    . Doxycycline Hives    Blisters and Hives around mouth and neck.  Some blistering of lips.  Mouth not effected. Patient started on ceftriaxone and doxycyline 1-2 days prior to reaction.      Antimicrobials this admission: 9/30 vancomycin >>  9/30 cefepime  >> 9/30  Thank you for allowing pharmacy to be a part of this patient's care.  Lu Duffel, PharmD, BCPS Clinical Pharmacist 09/12/2019 8:35 AM

## 2019-09-12 NOTE — Evaluation (Signed)
Physical Therapy Evaluation Patient Details Name: Trevor [name] MRN: [id number] DOB: 08/09/1946 Today's Date: 09/12/2019   History of Present Illness  Mr. Siemen is a 73 y.o. male admitted for L2-3 XLIF and revision of posterior instrumentation. As per Dr. Rhea Bleacher note, this procedure was complicated by a possible breach in the surgical field due to a hole identified in the drape of the C arm. All the hardware was removed except two screws which fractured. Pt is now POD #1 and being followed by infectious disease for antibiotic management. 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  Clinical Impression  PT/OT co-evaluation.  Prior to hospital admission, pt was modified independent (no AD use in home but used SPC and L LE leg brace ambulating in community).  Pt lives alone in 1 level home with ramp entry; has friends and family that can assist PRN/intermittently.  Currently pt is SBA to CGA to min assist with bed mobility via logrolling.  Pain 6/10 LBP at rest but LBP decreased to 3-4/10 sitting edge of bed but abdominal pain increased to 7/10--pt also reporting getting nauseas and "swimmy headed" sitting edge of bed and then needing to lay back down limiting session's activities.  2 assist to reposition pt for comfort in bed.  Pt's BP 119/76 resting in bed beginning of session; BP decreased to 103/59 sitting edge of bed; and BP increased to 149/70 end of session resting in bed (nurse notified of pt's vitals, symptoms, and pain).  Pt verbalizing appropriate understanding of back precautions.  Pt would benefit from skilled PT to address noted impairments and functional limitations (see below for any additional details).  Upon hospital discharge, recommend pt discharge with HHPT (anticipate pt may need to be w/c level with functional mobility initially depending on pt's pain and  ability to progress with functional mobility).    Follow Up Recommendations Home health PT;Other (comment)(Intermittent assist)    Equipment Recommendations  Rolling walker with 5" wheels;3in1 (PT);Wheelchair (measurements PT);Wheelchair cushion (measurements PT)    Recommendations for Other Services OT consult     Precautions / Restrictions Precautions Precautions: Back Precaution Booklet Issued: Yes (comment) Precaution Comments: No bending, arching, twisting; 2 drains Required Braces or Orthoses: Spinal Brace Spinal Brace: Other (comment);Applied in sitting position Spinal Brace Comments: LSO (apply/remove while sitting; may remove in bed; may ambulate to bathroom without brace; may remove brace to shower) Restrictions Weight Bearing Restrictions: No      Mobility  Bed Mobility Overal bed mobility: Needs Assistance Bed Mobility: Rolling;Sidelying to Sit;Sit to Sidelying Rolling: Min assist;Min guard Sidelying to sit: Supervision;Min guard     Sit to sidelying: Supervision;Min guard General bed mobility comments: min assist for logrolling to L side and then pt able to sit up onto edge of bed; pt then layed down onto L side with CGA and able to roll onto back with SBA but CGA/SBA logrolling R and L in bed to adjust linens under pt so able to boost pt up in bed with use of bed sheet  Transfers                 General transfer comment: Deferred d/t pt became "swimmy headed" sitting edge of bed with increased abdominal pain and nausea and pt requesting to lay back down  Ambulation/Gait             General Gait Details: not able to assess d/t  above  Stairs            Wheelchair Mobility    Modified Rankin (Stroke Patients Only)       Balance Overall balance assessment: Needs assistance Sitting-balance support: Feet supported;No upper extremity supported Sitting balance-Leahy Scale: Fair Sitting balance - Comments: CGA static sitting edge of bed                                      Pertinent Vitals/Pain Pain Assessment: 0-10 Pain Score: 7  Pain Location: Pt endorses 6/10 back pain at rest. When seated, pain in abdomen increases to 7/10. Pain Descriptors / Indicators: Sore;Constant;Cramping;Grimacing;Guarding Pain Intervention(s): Limited activity within patient's tolerance;Monitored during session;Premedicated before session;Repositioned    Home Living Family/patient expects to be discharged to:: Private residence Living Arrangements: Alone Available Help at Discharge: Family;Friend(s);Available PRN/intermittently Type of Home: House Home Access: Ramped entrance     Home Layout: One level Home Equipment: [employer] - 4 wheels;Wheelchair - manual;Hand held shower head;Cane - single point;Walker - 2 wheels;Other (comment);Grab bars - toilet(Tub bench)      Prior Function Level of Independence: Independent with assistive device(s)         Comments: Pt reports being independent and driving.  Uses L LE leg brace for ambulation outside of home but not within home (uses to prevent L knee buckling).  Pt with 1 fall in past month (pt was not wearing L leg brace).  No AD use in home but uses [facility] for community distances.     Hand Dominance   Dominant Hand: Right    Extremity/Trunk Assessment   Upper Extremity Assessment Upper Extremity Assessment: Defer to OT evaluation    Lower Extremity Assessment Lower Extremity Assessment: (at least 3/5 AROM hip flexion (limited ROM L LE compared to R LE d/t LBP), knee flexion/extension, and DF/PF (deferred MMT d/t LBP); light touch intact B LE's)    Cervical / Trunk Assessment Cervical / Trunk Assessment: (forward head/shoulders)  Communication   Communication: No difficulties  Cognition Arousal/Alertness: Awake/alert Behavior During Therapy: WFL for tasks assessed/performed Overall Cognitive Status: Within Functional Limits for tasks assessed                                         General Comments General comments (skin integrity, edema, etc.): 2 drains intact beginning/end of session    Exercises    Assessment/Plan    PT Assessment Patient needs continued PT services  PT Problem List Decreased strength;Decreased activity tolerance;Decreased balance;Decreased mobility;Decreased knowledge of use of DME;Decreased knowledge of precautions;Pain;Decreased skin integrity       PT Treatment Interventions DME instruction;Gait training;Functional mobility training;Therapeutic activities;Therapeutic exercise;Balance training;Patient/family education    PT Goals (Current goals can be found in the Care Plan section)  Acute Rehab PT Goals Patient Stated Goal: To have less pain in my back PT Goal Formulation: With patient Time For Goal Achievement: 09/26/19 Potential to Achieve Goals: Fair    Frequency BID   Barriers to discharge        Co-evaluation PT/OT/SLP Co-Evaluation/Treatment: Yes Reason for Co-Treatment: Complexity of the patient's impairments (multi-system involvement);For patient/therapist safety;To address functional/ADL transfers PT goals addressed during session: Mobility/safety with mobility;Balance;Strengthening/ROM OT goals addressed during session: ADL's and self-care;Strengthening/ROM       AM-PAC PT "6 Clicks" Mobility  Outcome Measure  Help needed turning from your back to your side while in a flat bed without using bedrails?: A Little Help needed moving from lying on your back to sitting on the side of a flat bed without using bedrails?: A Little Help needed moving to and from a bed to a chair (including a wheelchair)?: A Lot Help needed standing up from a chair using your arms (e.g., wheelchair or bedside chair)?: A Lot Help needed to walk in hospital room?: Total Help needed climbing 3-5 steps with a railing? : Total 6 Click Score: 12    End of Session Equipment Utilized During Treatment: Gait belt;Back  brace Activity Tolerance: Patient limited by pain Patient left: in bed;with call bell/phone within reach;with bed alarm set;with family/visitor present;with SCD's reapplied Nurse Communication: Mobility status;Precautions;Other (comment)(Pt's pain during session and nausea) PT Visit Diagnosis: Other abnormalities of gait and mobility (R26.89);Muscle weakness (generalized) (M62.81);History of falling (Z91.81);Difficulty in walking, not elsewhere classified (R26.2);Pain    Time: [id number] PT Time Calculation (min) (ACUTE ONLY): 35 min   Charges:   PT Evaluation $PT Eval Low Complexity: 1 Low         Monta Maiorana, PT 09/12/19, 4:36 PM [phone]

## 2019-09-12 NOTE — Progress Notes (Addendum)
Procedure: L2-3 XLIF with L3-S1 revision Procedure date: 09/11/2019 Diagnosis: Adjacent segment disease with kyphosis  History: BERLE VOLLRATH is s/p Aborted L2-3 XLIF with L3-S1 revision  POD1: He is doing fair. Complains of 5/10 back pain. Unable to complete xrays this AM due to dizziness. Complains of mild headache and nausea, but feels this is medication and empty stomach related. Denies any lower extremity complaints.    Right drain output 40.  Left drain output 60  POD0: Procedure aborted due to potential contamination from hole in C-arm drape.   Physical Exam: Vitals:   09/12/19 0801 09/12/19 1326  BP: (!) 142/70 121/63  Pulse: 86 (!) 109  Resp: 18   Temp: 97.9 F (36.6 C)   SpO2: 100% 98%    AA Ox3 Strength: Able to move all extremities independently Skin: No active bleeding at drain insertion sites. Moderately saturated incision site.   Data:  Recent Labs  Lab 09/05/19 1500 09/12/19 0454  NA 137  --   K 3.9  --   CL 101  --   CO2 26  --   BUN 22  --   CREATININE 0.95 0.89  GLUCOSE 107*  --   CALCIUM 9.7  --    No results for input(s): AST, ALT, ALKPHOS in the last 168 hours.  Invalid input(s): TBILI   Recent Labs  Lab 09/05/19 1500  WBC 5.9  HGB 12.8*  HCT 38.9*  PLT 131*   Recent Labs  Lab 09/11/19 0624  APTT 30  INR 1.2         Other tests/results: Lumbar x-rays pending  Assessment/Plan:  [name] is POD1 s/p aborted L2-3 Xlif with L3-S1 revision. He is doing fair.  - monitor drain output - mobilize - pain control - DVT prophylaxis - PTOT - imaging  Marin Olp PA-C Department of Neurosurgery

## 2019-09-12 NOTE — Progress Notes (Signed)
Left drain pulled without issue. Incision cleaned with povidone. Dressings changed.  Has not had BM since Tuesday. Continues to feel upset stomach.  Will try another bowel regimen

## 2019-09-13 DIAGNOSIS — I951 Orthostatic hypotension: Secondary | ICD-10-CM

## 2019-09-13 DIAGNOSIS — D649 Anemia, unspecified: Secondary | ICD-10-CM

## 2019-09-13 LAB — CBC WITH DIFFERENTIAL/PLATELET
Abs Immature Granulocytes: 0.11 10*3/uL — ABNORMAL HIGH (ref 0.00–0.07)
Basophils Absolute: 0 10*3/uL (ref 0.0–0.1)
Basophils Relative: 0 %
Eosinophils Absolute: 0 10*3/uL (ref 0.0–0.5)
Eosinophils Relative: 0 %
HCT: 25.3 % — ABNORMAL LOW (ref 39.0–52.0)
Hemoglobin: 8.1 g/dL — ABNORMAL LOW (ref 13.0–17.0)
Immature Granulocytes: 1 %
Lymphocytes Relative: 10 %
Lymphs Abs: 1.2 10*3/uL (ref 0.7–4.0)
MCH: 28.7 pg (ref 26.0–34.0)
MCHC: 32 g/dL (ref 30.0–36.0)
MCV: 89.7 fL (ref 80.0–100.0)
Monocytes Absolute: 1.2 10*3/uL — ABNORMAL HIGH (ref 0.1–1.0)
Monocytes Relative: 9 %
Neutro Abs: 9.9 10*3/uL — ABNORMAL HIGH (ref 1.7–7.7)
Neutrophils Relative %: 80 %
Platelets: 100 10*3/uL — ABNORMAL LOW (ref 150–400)
RBC: 2.82 MIL/uL — ABNORMAL LOW (ref 4.22–5.81)
RDW: 12.3 % (ref 11.5–15.5)
WBC: 12.4 10*3/uL — ABNORMAL HIGH (ref 4.0–10.5)
nRBC: 0 % (ref 0.0–0.2)

## 2019-09-13 LAB — COMPREHENSIVE METABOLIC PANEL
ALT: 30 U/L (ref 0–44)
AST: 52 U/L — ABNORMAL HIGH (ref 15–41)
Albumin: 3.2 g/dL — ABNORMAL LOW (ref 3.5–5.0)
Alkaline Phosphatase: 70 U/L (ref 38–126)
Anion gap: 11 (ref 5–15)
BUN: 15 mg/dL (ref 8–23)
CO2: 22 mmol/L (ref 22–32)
Calcium: 8.3 mg/dL — ABNORMAL LOW (ref 8.9–10.3)
Chloride: 100 mmol/L (ref 98–111)
Creatinine, Ser: 0.74 mg/dL (ref 0.61–1.24)
GFR calc Af Amer: >60 mL/min (ref >60)
GFR calc non Af Amer: >60 mL/min (ref >60)
Glucose, Bld: 141 mg/dL — ABNORMAL HIGH (ref 70–99)
Potassium: 3.9 mmol/L (ref 3.5–5.1)
Sodium: 133 mmol/L — ABNORMAL LOW (ref 135–145)
Total Bilirubin: 0.7 mg/dL (ref 0.3–1.2)
Total Protein: 5.9 g/dL — ABNORMAL LOW (ref 6.5–8.1)

## 2019-09-13 LAB — CREATININE, SERUM
Creatinine, Ser: 0.69 mg/dL (ref 0.61–1.24)
GFR calc Af Amer: >60 mL/min (ref >60)
GFR calc non Af Amer: >60 mL/min (ref >60)

## 2019-09-13 LAB — PREPARE RBC (CROSSMATCH)

## 2019-09-13 MED ORDER — SODIUM CHLORIDE 0.9% IV SOLUTION
Freq: Once | INTRAVENOUS | Status: AC
  Administered 2019-09-13: 19:00:00 via INTRAVENOUS

## 2019-09-13 MED ORDER — SODIUM CHLORIDE 0.9 % IV BOLUS: 2000.0000 mL | Freq: Once | INTRAVENOUS | Status: DC

## 2019-09-13 MED ORDER — BISACODYL 10 MG RE SUPP
10.0000 mg | Freq: Once | RECTAL | Status: AC
  Administered 2019-09-13: 10 mg via RECTAL
  Filled 2019-09-13: qty 1

## 2019-09-13 MED ORDER — VANCOMYCIN HCL 10 G IV SOLR
2000.0000 mg | INTRAVENOUS | Status: AC
  Administered 2019-09-13: 2000 mg via INTRAVENOUS
  Filled 2019-09-13 (×2): qty 2000

## 2019-09-13 MED ORDER — DIAZEPAM 5 MG PO TABS
5.0000 mg | ORAL_TABLET | Freq: Once | ORAL | Status: AC
  Administered 2019-09-13: 5 mg via ORAL
  Filled 2019-09-13 (×2): qty 1

## 2019-09-13 MED ORDER — SODIUM CHLORIDE 0.9 % IV BOLUS
1000.0000 mL | Freq: Once | INTRAVENOUS | Status: AC
  Administered 2019-09-13: 1000 mL via INTRAVENOUS

## 2019-09-13 NOTE — Progress Notes (Addendum)
Procedure: L2-3 XLIF with L3-S1 revision Procedure date: 09/11/2019 Diagnosis: Adjacent segment disease with kyphosis  History: Trevor [name] is s/p Aborted L2-3 XLIF with L3-S1 revision  POD2: Continues to recovery well. Pain 4/10. He was able to sleep well last night and that has improved things overall. Voiding and eating without issue but appetite is low due to abdominal discomfort. Had small BM yesterday but it did not relieve discomfort.  Continues abx.  Has not yet ambulated without issue - dizziness. Complains of mild headache but feels less "swimmy" headed.  Drain output 35 overnight.    POD1: He is doing fair. Complains of 5/10 back pain. Unable to complete xrays this AM due to dizziness. Complains of mild headache and nausea, but feels this is medication and empty stomach related. Denies any lower extremity complaints.    Right drain output 40.  Left drain output 60  POD0: Procedure aborted due to potential contamination from hole in C-arm drape.   Physical Exam: Vitals:   09/13/19 0428 09/13/19 0820  BP: 125/62 118/73  Pulse: 100 92  Resp: 20 18  Temp: 98.6 F (37 C) 98.5 F (36.9 C)  SpO2: 99% 100%    AA Ox3 Strength: Able to move all extremities independently Skin: No active bleeding at drain insertion sites. Moderately saturated incision site.   Data:  Recent Labs  Lab 09/13/19 0443  CREATININE 0.69   No results for input(s): AST, ALT, ALKPHOS in the last 168 hours.  Invalid input(s): TBILI   No results for input(s): WBC, HGB, HCT, PLT in the last 168 hours. Recent Labs  Lab 09/11/19 0624  APTT 30  INR 1.2         Other tests/results: Lumbar x-rays pending  Assessment/Plan:  Trevor [name] is POD1 s/p aborted L2-3 Xlif with L3-S1 revision. He is doing well.   - monitor drain output - mobilize - pain control - DVT prophylaxis - PTOT - imaging - abx - suppository   Appreciate pharmacy consult and abx management.   Marin Olp  PA-C Department of Neurosurgery

## 2019-09-13 NOTE — Progress Notes (Addendum)
VAST consulted to place IV access. Phone calls made to pharmacy and pt's nurse to try and determine length of therapy with vancomycin so as to provide appropriate vascular access. Pharmacy stated his decision would be based on notes and documentation from infectious disease physician. Jasmine, pt's nurse was unaware of length of treatment with Vanc. Secure chat sent to Delaine Lame, MD asking about Vanc length of tx. Also paged Delaine Lame, MD. Spoke with Delaine Lame, MD who reported pt would not be on long term antibiotics.

## 2019-09-13 NOTE — Progress Notes (Signed)
Per [name] admin unit of blood and after completion admin 1 litter NS bolus at 250 ml/hr, RN to assess patient's status if still orthostatic administer second bolus at 250 ml/hr.

## 2019-09-13 NOTE — Consult Note (Signed)
Pharmacy Antibiotic Note  Trevor [name] is a 73 y.o. male admitted on 09/11/2019 following planned surgical procedure.  Pharmacy has been consulted for vancomycin dosing for possible OR contamination.   Spoke with SDS nurse who confirmed that patient did received Vancomycin 1000mg  IV x 1 dose around 0715 on 9/30. Dose was not documented on MAR but there is a cabinet override for Vancomycin 1g IV.   Plan: Patient received Vancomycin 750 mg IV Q 12 hrs x 3 doses after a 2g load, will update dosing with today's Scr of 0.69 (was 0.95)  Vancomycin 2000 mg IV Q 24 hrs. Goal AUC 400-550. Expected AUC: 490, estimated Cmin 9.1 Cmax 41.9  SCr used: 0.80  Pharmacy will continue to monitor and adjust dose as needed.   Height: 5' 7.5" (171.5 cm) Weight: 180 lb 1.9 oz (81.7 kg) IBW/kg (Calculated) : 67.25  Temp (24hrs), Avg:98.4 F (36.9 C), Min:97.9 F (36.6 C), Max:98.6 F (37 C)  Recent Labs  Lab 09/12/19 0454 09/13/19 0443  CREATININE 0.89 0.69    Estimated Creatinine Clearance: 85 mL/min (by C-G formula based on SCr of 0.69 mg/dL).    Allergies  Allergen Reactions  . Ceftriaxone Hives    Blisters and Hives around mouth and neck.  Some blistering of lips.  Mouth not effected.  Patient started on ceftriaxone and doxycyline 1-2 days prior to reaction.    . Doxycycline Hives    Blisters and Hives around mouth and neck.  Some blistering of lips.  Mouth not effected. Patient started on ceftriaxone and doxycyline 1-2 days prior to reaction.      Antimicrobials this admission: 9/30 vancomycin >>  9/30 cefepime  >> 9/30 9/30 Levofloxacin >>  Thank you for allowing pharmacy to be a part of this patient's care.  Lu Duffel, PharmD, BCPS Clinical Pharmacist 09/13/2019 7:14 AM

## 2019-09-13 NOTE — Progress Notes (Signed)
Physical Therapy Treatment Patient Details Name: Trevor [name] MRN: [id number] DOB: 12-21-1945 Today's Date: 09/13/2019    History of Present Illness Trevor [name] is a 73 y.o. male admitted for L2-3 XLIF and revision of posterior instrumentation. As per Trevor [name] note, this procedure was complicated by a possible breach in the surgical field due to a hole identified in the drape of the C arm. All the hardware was removed except two screws which fractured. Pt is now POD #1 and being followed by infectious disease for antibiotic management. 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    PT Comments    Pt reporting 3/10 LBP laying in bed beginning of session.  Pt modified independent semi-supine to/from sit via logrolling x2 trials (pt layed back down d/t abdominal pain but after resting in bed pt agreeable to trying to sit up again and try to stand).  Pt able to stand on 2nd trial with CGA and RW use and then sidestep to L a few feet with RW (steady); pt declined to get to chair d/t abdominal pain (pt reports d/t needing to have bowel movement but hasn't been able).  After getting pt set up in bed, pt reporting he felt he may be able to have bowel movement on [facility] (nurse arrived and assisted pt with this).  Of note, pt's BP 119/50 with HR 97 bpm resting in bed beginning of session; BP decreased to 108/58 sitting edge of bed; BP decreased to 92/66 with HR 118 bpm standing; and BP increased to 127/72 with HR decreasing to 100 bpm after sitting back down (nurse notified of pt's vitals; pt asymptomatic during session).   Will continue to progress pt with strengthening and progressive functional mobility per pt tolerance.   Follow Up Recommendations  Home health PT;Other (comment)(Intermittent assist)     Equipment Recommendations  Rolling walker with 5" wheels;3in1 (PT);Wheelchair  (measurements PT);Wheelchair cushion (measurements PT)    Recommendations for Other Services OT consult     Precautions / Restrictions Precautions Precautions: Back Precaution Booklet Issued: Yes (comment) Precaution Comments: No bending, arching, twisting; 1 drain Required Braces or Orthoses: Spinal Brace Spinal Brace: Other (comment);Applied in sitting position Spinal Brace Comments: LSO (apply/remove while sitting; may remove in bed; may ambulate to bathroom without brace; may remove brace to shower) Restrictions Weight Bearing Restrictions: No Other Position/Activity Restrictions: L LE brace PRN per pt    Mobility  Bed Mobility Overal bed mobility: Needs Assistance Bed Mobility: Rolling;Sidelying to Sit;Sit to Sidelying Rolling: Modified independent (Device/Increase time) Sidelying to sit: Modified independent (Device/Increase time)     Sit to sidelying: Modified independent (Device/Increase time) General bed mobility comments: mild increased effort to perform but no physical assist required; HOB mildly elevated  Transfers Overall transfer level: Needs assistance Equipment used: Rolling walker (2 wheeled) Transfers: Sit to/from Stand Sit to Stand: Min guard         General transfer comment: 1st trial standing pt placing B UE's on walker to stand and unable to stand on own; 2nd trial standing pt pushing B UE's on bed and able to stand with mild increased effort up to RW with CGA and demonstrated good controlled descent sitting back onto bed  Ambulation/Gait Ambulation/Gait assistance: Min guard Gait Distance (Feet): (side stepped a few feet to L) Assistive device: Rolling walker (2 wheeled)   Gait velocity: decreased   General Gait Details: steady with  RW   Stairs             Wheelchair Mobility    Modified Rankin (Stroke Patients Only)       Balance Overall balance assessment: Needs assistance Sitting-balance support: No upper extremity  supported;Feet supported Sitting balance-Leahy Scale: Fair Sitting balance - Comments: steady sitting edge of bed no UE support   Standing balance support: Single extremity supported Standing balance-Leahy Scale: Poor Standing balance comment: pt requiring at least single UE support for static standing balance                            Cognition Arousal/Alertness: Awake/alert Behavior During Therapy: WFL for tasks assessed/performed Overall Cognitive Status: Within Functional Limits for tasks assessed                                        Exercises      General Comments General comments (skin integrity, edema, etc.): drain intact beginning/end of session.  Nursing cleared pt for participation in physical therapy.  Pt agreeable to PT session.      Pertinent Vitals/Pain Pain Assessment: 0-10 Pain Score: 5  Pain Location: abdominal >back pain Pain Descriptors / Indicators: Sore;Constant;Cramping;Grimacing;Guarding Pain Intervention(s): Limited activity within patient's tolerance;Monitored during session;Premedicated before session;Repositioned    Home Living                      Prior Function            PT Goals (current goals can now be found in the care plan section) Acute Rehab PT Goals Patient Stated Goal: To have less pain in my back PT Goal Formulation: With patient Time For Goal Achievement: 09/26/19 Potential to Achieve Goals: Fair Additional Goals Additional Goal #1: Pt able to propel w/c modified independently x150 feet. Progress towards PT goals: Progressing toward goals    Frequency    BID      PT Plan Current plan remains appropriate    Co-evaluation              AM-PAC PT "6 Clicks" Mobility   Outcome Measure  Help needed turning from your back to your side while in a flat bed without using bedrails?: None Help needed moving from lying on your back to sitting on the side of a flat bed without using  bedrails?: None Help needed moving to and from a bed to a chair (including a wheelchair)?: A Little Help needed standing up from a chair using your arms (e.g., wheelchair or bedside chair)?: A Little Help needed to walk in hospital room?: A Little Help needed climbing 3-5 steps with a railing? : A Lot 6 Click Score: 19    End of Session Equipment Utilized During Treatment: Gait belt;Back brace Activity Tolerance: Patient limited by pain Patient left: in bed;with call bell/phone within reach;with bed alarm set;with nursing/sitter in room Nurse Communication: Mobility status;Precautions PT Visit Diagnosis: Other abnormalities of gait and mobility (R26.89);Muscle weakness (generalized) (M62.81);History of falling (Z91.81);Difficulty in walking, not elsewhere classified (R26.2);Pain     Time: [id number] PT Time Calculation (min) (ACUTE ONLY): 23 min  Charges:  $Therapeutic Activity: 23-37 mins                     Leitha Bleak, PT 09/13/19, 10:02 AM [phone]

## 2019-09-13 NOTE — Care Management Important Message (Signed)
Important Message  Patient Details  Name: Trevor [name] MRN: [id number] Date of Birth: 11-02-1946   Medicare Important Message Given:  Yes     [name] 09/13/2019, 11:00 AM

## 2019-09-13 NOTE — Progress Notes (Signed)
Physical Therapy Treatment Patient Details Name: Trevor [name] MRN: [id number] DOB: February 11, 1946 Today's Date: 09/13/2019    History of Present Illness Trevor [name] is a 73 y.o. male admitted for L2-3 XLIF and revision of posterior instrumentation. As per Dr. Rhea Bleacher note, this procedure was complicated by a possible breach in the surgical field due to a hole identified in the drape of the C arm. All the hardware was removed except two screws which fractured. Pt is now POD #1 and being followed by infectious disease for antibiotic management. 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    PT Comments    Pt resting in bed upon PT arrival with pt's brother present.  Pt reporting plan to discharge home today but agreeable to PT session and walking.  Overall pt modified independent with bed mobility (via logrolling) and CGA with transfers and walking a few feet with RW bed to recliner and then later performing stand step turn transfer recliner to bed with RW.  Mild increased effort to stand but overall pt steady with RW use.  Therapist assisted pt with donning L LE brace for OOB activities; pt able to donn/doff LSO modified independently during session.  Pt reporting nausea sitting edge of bed but alleviated laying down in bed.  Pt reported feeling of "quizziness" sitting and standing (combination of queasiness and dizziness).  BP taken during session and pt demonstrating orthostatic hypotension (BP supine 136/72; sitting BP 109/60; standing BP 93/70; standing at 3 minutes--with pt taking steps forward and backwards d/t reports of dizziness--BP was 94/69).  Neurosurgery PA Coralie Keens present end of session to remove drain and therapist notified her of pt's BP and also notified pt's nurse regarding pt's BP.  Limited mobility during session d/t pt symptomatic with significant BP decrease with  position changes.  Will continue to focus on strengthening and progressive functional mobility per pt tolerance.    Follow Up Recommendations  Home health PT;Other (comment)(Intermittent assist)     Equipment Recommendations  Rolling walker with 5" wheels;3in1 (PT);Wheelchair (measurements PT);Wheelchair cushion (measurements PT)    Recommendations for Other Services OT consult     Precautions / Restrictions Precautions Precautions: Back Precaution Booklet Issued: Yes (comment) Precaution Comments: No bending, arching, twisting; 1 drain Required Braces or Orthoses: Spinal Brace Spinal Brace: Other (comment);Applied in sitting position Spinal Brace Comments: LSO (apply/remove while sitting; may remove in bed; may ambulate to bathroom without brace; may remove brace to shower) Restrictions Weight Bearing Restrictions: No Other Position/Activity Restrictions: L LE brace PRN per pt    Mobility  Bed Mobility Overal bed mobility: Modified Independent Bed Mobility: Rolling;Sidelying to Sit;Sit to Sidelying Rolling: Modified independent (Device/Increase time) Sidelying to sit: Modified independent (Device/Increase time)     Sit to sidelying: Modified independent (Device/Increase time) General bed mobility comments: mild increased effort to perform but no physical assist required; HOB mildly elevated  Transfers Overall transfer level: Needs assistance Equipment used: Rolling walker (2 wheeled) Transfers: Sit to/from [facility] Sit to Stand: Min guard Stand pivot transfers: Min guard       General transfer comment: x3 trials standing from bed; x1 trial standing from recliner; stand step turn recliner to bed with RW CGA; mild increased effort to stand but pt able to problem solve on own for proper hand placement  Ambulation/Gait Ambulation/Gait assistance: Min guard Gait Distance (Feet): 3 Feet(bed to recliner) Assistive device: Rolling walker (  2 wheeled)    Gait velocity: decreased   General Gait Details: steady with RW; limited distance able to safely ambulate d/t lightheadedness and BP concerns   Stairs             Wheelchair Mobility    Modified Rankin (Stroke Patients Only)       Balance Overall balance assessment: Needs assistance Sitting-balance support: No upper extremity supported;Feet supported Sitting balance-Leahy Scale: Good Sitting balance - Comments: steady sitting reaching within BOS   Standing balance support: No upper extremity supported Standing balance-Leahy Scale: Fair Standing balance comment: steady static standing no UE support (requires at least single UE support if reaching with single UE)                            Cognition Arousal/Alertness: Awake/alert Behavior During Therapy: WFL for tasks assessed/performed Overall Cognitive Status: Within Functional Limits for tasks assessed                                        Exercises      General Comments   Nursing cleared pt for participation in physical therapy.  Pt agreeable to PT session.  Pt's brother present during session.     Pertinent Vitals/Pain Pain Assessment: 0-10 Pain Score: 5  Pain Location: abdominal >back pain Pain Descriptors / Indicators: Sore;Constant;Cramping;Grimacing;Guarding Pain Intervention(s): Limited activity within patient's tolerance;Monitored during session;Premedicated before session;Repositioned    Home Living                      Prior Function            PT Goals (current goals can now be found in the care plan section) Acute Rehab PT Goals Patient Stated Goal: To have less pain in my back PT Goal Formulation: With patient Time For Goal Achievement: 09/26/19 Potential to Achieve Goals: Fair Additional Goals Additional Goal #1: Pt able to propel w/c modified independently x150 feet. Progress towards PT goals: Progressing toward goals    Frequency     BID      PT Plan Current plan remains appropriate    Co-evaluation              AM-PAC PT "6 Clicks" Mobility   Outcome Measure  Help needed turning from your back to your side while in a flat bed without using bedrails?: None Help needed moving from lying on your back to sitting on the side of a flat bed without using bedrails?: None Help needed moving to and from a bed to a chair (including a wheelchair)?: A Little Help needed standing up from a chair using your arms (e.g., wheelchair or bedside chair)?: A Little Help needed to walk in hospital room?: A Little Help needed climbing 3-5 steps with a railing? : A Lot 6 Click Score: 19    End of Session Equipment Utilized During Treatment: Gait belt;Back brace Activity Tolerance: Treatment limited secondary to medical complications (Comment)(limited d/t dizziness and decreased BP with position changes (nurse and Neurosurgery PA notified)) Patient left: in bed;with call bell/phone within reach;with bed alarm set;with family/visitor present;Other (comment)(Neurosurgery PA present removing pt's drain) Nurse Communication: Mobility status;Precautions;Other (comment)(Pt's BP during session) PT Visit Diagnosis: Other abnormalities of gait and mobility (R26.89);Muscle weakness (generalized) (M62.81);History of falling (Z91.81);Difficulty in walking, not elsewhere classified (R26.2);Pain     Time:  [id number] PT Time Calculation (min) (ACUTE ONLY): 40 min  Charges:  $Therapeutic Exercise: 8-22 mins $Therapeutic Activity: 23-37 mins                     Leitha Bleak, PT 09/13/19, 4:26 PM [phone]

## 2019-09-13 NOTE — Progress Notes (Signed)
ID Pt c/o dizziness when he gets up Is nauseous as well  Patient Vitals for the past 24 hrs:  BP Temp Temp src Pulse Resp SpO2  09/13/19 0820 118/73 98.5 F (36.9 C) - 92 18 100 %  09/13/19 0428 125/62 98.6 F (37 C) Oral 100 20 99 %  09/12/19 2341 (!) 115/54 98.6 F (37 C) Oral 97 19 97 %   O/e awake , alert, tired  Bp lying down was 136/72  Sitting 109/60 Standing 94/69  Hss1s2 Chest b/l air entry  Back- surgical dressing changed by neurosurgery PA Drain removed Sutures intact SS looks good with no erythema, discharge   CNS- did not examine in detail  CBC Latest Ref Rng & Units 09/13/2019 09/05/2019 07/12/2018  WBC 4.0 - 10.5 K/uL 12.4(H) 5.9 5.9  Hemoglobin 13.0 - 17.0 g/dL 8.1(L) 12.8(L) 11.8(L)  Hematocrit 39.0 - 52.0 % 25.3(L) 38.9(L) 36.1(L)  Platelets 150 - 400 K/uL 100(L) 131(L) 98(L)      CMP Latest Ref Rng & Units 09/13/2019 09/13/2019 09/12/2019  Glucose 70 - 99 mg/dL 141(H) - -  BUN 8 - 23 mg/dL 15 - -  Creatinine 0.61 - 1.24 mg/dL 0.74 0.69 0.89  Sodium 135 - 145 mmol/L 133(L) - -  Potassium 3.5 - 5.1 mmol/L 3.9 - -  Chloride 98 - 111 mmol/L 100 - -  CO2 22 - 32 mmol/L 22 - -  Calcium 8.9 - 10.3 mg/dL 8.3(L) - -  Total Protein 6.5 - 8.1 g/dL 5.9(L) - -  Total Bilirubin 0.3 - 1.2 mg/dL 0.7 - -  Alkaline Phos 38 - 126 U/L 70 - -  AST 15 - 41 U/L 52(H) - -  ALT 0 - 44 U/L 30 - -     Impression/Recommednation  Possible breach/contamination of the surgical field by hole in the drape of C arm- during lumbar fusion surgery All hardware removed except for 2 fractured screws. No cultures sent from surgical site as there was no infection before the surgery. Completed  vanco and levaquin prophylaxis for 72 hrs.  baseline ESR and CRP obtained  ( which may be increased due to the surgery) repeat after 3 weeks. Depending on the patients progress- fusion surgery will be done later. At that time cultures can be taken from surgical site . Discussed the management  with Dr.YArbrough and Marin Olp   Dizziness due to orthostatic hypotension  Nearly 4.8 gram HB drop- acute symptomatic anemia PRBC being planned  H/o  L3-s1 fusion Dec 2018 for lumbar radiculopathy  H/o L2-L3 discectomy on 06/20/18 H/o proteus bacteremia and lumbar surgical wound infection 07/07/18 MRI questioned epidural collection  On the aspiration fluid done by IR culture negative - treated with IV initially and then PO amoxicillin.  Wound healed   Chronic thrombocytopenia  H/o possible ceftriaxone/Doxy allergy VS severe  herpes outbreak of the mouth in 2019. No pictures to look. Will not give cephalosporin  ? Discussed the management with patient and Marin Olp PA neurosurgery  . Tsosie Billing

## 2019-09-14 LAB — HEMOGLOBIN AND HEMATOCRIT, BLOOD
HCT: 24.4 % — ABNORMAL LOW (ref 39.0–52.0)
Hemoglobin: 8.2 g/dL — ABNORMAL LOW (ref 13.0–17.0)

## 2019-09-14 LAB — CREATININE, SERUM
Creatinine, Ser: 0.63 mg/dL (ref 0.61–1.24)
GFR calc Af Amer: >60 mL/min (ref >60)
GFR calc non Af Amer: >60 mL/min (ref >60)

## 2019-09-14 MED ORDER — LACTULOSE 10 GM/15ML PO SOLN
20.0000 g | Freq: Once | ORAL | Status: AC | PRN
  Administered 2019-09-14: 20 g via ORAL
  Filled 2019-09-14: qty 30

## 2019-09-14 MED ORDER — METHOCARBAMOL 1000 MG/10ML IJ SOLN
500.0000 mg | Freq: Four times a day (QID) | INTRAVENOUS | Status: DC
  Filled 2019-09-14: qty 5

## 2019-09-14 MED ORDER — ENOXAPARIN SODIUM 40 MG/0.4ML ~~LOC~~ SOLN
40.0000 mg | SUBCUTANEOUS | Status: DC
  Administered 2019-09-14 – 2019-09-15 (×2): 40 mg via SUBCUTANEOUS
  Filled 2019-09-14 (×2): qty 0.4

## 2019-09-14 MED ORDER — METHOCARBAMOL 500 MG PO TABS
500.0000 mg | ORAL_TABLET | Freq: Four times a day (QID) | ORAL | Status: DC
  Administered 2019-09-14 – 2019-09-16 (×6): 500 mg via ORAL
  Filled 2019-09-14 (×7): qty 1

## 2019-09-14 MED ORDER — SODIUM CHLORIDE 0.9 % IV BOLUS
1000.0000 mL | Freq: Once | INTRAVENOUS | Status: AC
  Administered 2019-09-14: 1000 mL via INTRAVENOUS

## 2019-09-14 MED ORDER — OXYCODONE HCL 5 MG PO TABS
5.0000 mg | ORAL_TABLET | ORAL | Status: DC | PRN
  Administered 2019-09-16: 5 mg via ORAL

## 2019-09-14 NOTE — Progress Notes (Signed)
Physical Therapy Treatment Patient Details Name: Trevor [name] MRN: [id number] DOB: 1946-09-07 Today's Date: 09/14/2019    History of Present Illness Trevor [name] is a 73 y.o. male admitted for L2-3 XLIF and revision of posterior instrumentation. As per Dr. Rhea Bleacher note, this procedure was complicated by a possible breach in the surgical field due to a hole identified in the drape of the C arm. All the hardware was removed except two screws which fractured. Pt is now POD #1 and being followed by infectious disease for antibiotic management. 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    PT Comments    Pt in bed, feeling better.   Out of bed with no assist and able to don back brace once given to him.  Declined wearing PRN knee brace today.  Orthostatic BP's taken and stable.  Documented in flow sheets.  Pt was able to walk around unit with walker and min guard with no LOB or buckling.  No dizziness reported.  Some mild inc in pain with mobility but it did not limit him.  Requested to return to bed after gait.  Discussed discharge plan.  Pt has all equipment needed at home including a wheelchair and a ramp.  Discussed with RN.   Follow Up Recommendations  Home health PT;Other (comment)     Equipment Recommendations  Rolling walker with 5" wheels;3in1 (PT);Wheelchair (measurements PT);Wheelchair cushion (measurements PT)    Recommendations for Other Services       Precautions / Restrictions Precautions Precautions: Back Required Braces or Orthoses: Spinal Brace Spinal Brace: Other (comment);Applied in sitting position Spinal Brace Comments: LSO (apply/remove while sitting; may remove in bed; may ambulate to bathroom without brace; may remove brace to shower) Restrictions Weight Bearing Restrictions: No Other Position/Activity Restrictions: L LE brace PRN per pt -  not worn today.  stated he wears it when he walks outside or in community.  declined in facility    Mobility  Bed Mobility Overal bed mobility: Modified Independent Bed Mobility: Rolling;Sidelying to Sit;Sit to Sidelying Rolling: Modified independent (Device/Increase time) Sidelying to sit: Modified independent (Device/Increase time)     Sit to sidelying: Supervision General bed mobility comments: mild increased effort to perform but no physical assist required; HOB mildly elevated  Transfers Overall transfer level: Needs assistance Equipment used: Rolling walker (2 wheeled) Transfers: Sit to/from Stand Sit to Stand: Min guard            Ambulation/Gait Ambulation/Gait assistance: Min guard Gait Distance (Feet): 200 Feet Assistive device: Rolling walker (2 wheeled) Gait Pattern/deviations: Step-through pattern Gait velocity: decreased   General Gait Details: steady gait.  some flexed posture due to walker height but did not want it raised   Stairs             Wheelchair Mobility    Modified Rankin (Stroke Patients Only)       Balance Overall balance assessment: Needs assistance Sitting-balance support: No upper extremity supported;Feet supported Sitting balance-Leahy Scale: Good Sitting balance - Comments: steady sitting reaching within BOS   Standing balance support: Bilateral upper extremity supported Standing balance-Leahy Scale: Fair                              Cognition Arousal/Alertness: Awake/alert Behavior During Therapy: WFL for tasks assessed/performed Overall Cognitive Status: Within Functional Limits for tasks assessed  Exercises      General Comments        Pertinent Vitals/Pain Pain Assessment: 0-10 Pain Score: 6  Pain Location: abdominal >back pain - with movement Pain Descriptors / Indicators: Sore;Constant;Cramping;Grimacing;Guarding Pain Intervention(s):  Limited activity within patient's tolerance;Monitored during session    Home Living                      Prior Function            PT Goals (current goals can now be found in the care plan section) Progress towards PT goals: Progressing toward goals    Frequency    BID      PT Plan Current plan remains appropriate    Co-evaluation              AM-PAC PT "6 Clicks" Mobility   Outcome Measure  Help needed turning from your back to your side while in a flat bed without using bedrails?: None Help needed moving from lying on your back to sitting on the side of a flat bed without using bedrails?: None Help needed moving to and from a bed to a chair (including a wheelchair)?: A Little Help needed standing up from a chair using your arms (e.g., wheelchair or bedside chair)?: A Little   Help needed climbing 3-5 steps with a railing? : A Lot 6 Click Score: 16    End of Session Equipment Utilized During Treatment: Gait belt;Back brace Activity Tolerance: Patient tolerated treatment well Patient left: in bed;with call bell/phone within reach;with bed alarm set;Other (comment) Nurse Communication: Mobility status;Precautions;Other (comment)       Time: [id number] PT Time Calculation (min) (ACUTE ONLY): 20 min  Charges:  $Gait Training: 8-22 mins                    Chesley Noon, PTA 09/14/19, 9:20 AM

## 2019-09-14 NOTE — Progress Notes (Signed)
First 1 Litre bolus done. Pt reassessed. Bp 138/71 while lying, 109/60 while sitting. Pt is orthostatic. Pt refused to stand. Will administer the second bolus as per the doctors order.

## 2019-09-14 NOTE — Progress Notes (Signed)
Bolus order 1 Litre at 252ml/hr, then assess the patient if orthostatic give second bolus at 215ml/hr clarified by Dr. Cari Caraway.

## 2019-09-14 NOTE — Progress Notes (Signed)
Physical Therapy Treatment Patient Details Name: Trevor [name] MRN: [id number] DOB: 1946/06/27 Today's Date: 09/14/2019    History of Present Illness Trevor [name] is a 73 y.o. male admitted for L2-3 XLIF and revision of posterior instrumentation. As per Dr. Rhea Bleacher note, this procedure was complicated by a possible breach in the surgical field due to a hole identified in the drape of the C arm. All the hardware was removed except two screws which fractured. Pt is now POD #1 and being followed by infectious disease for antibiotic management. 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    PT Comments    Patient in bed upon PT arrival, eager to participate in physical therapy session. Demonstrates Mod I bed mobility and ability to don/doff TLSO brace correctly. Static balance in standing is functional and safe however upon addition of movement patient requires use of UE support for stability. Ambulatory mechanics are functional however patient does demonstrate fatigue with ambulating with cueing required for breathing and decreased hip/knee flexion with fatigue. Current POC remains appropriate at this time.     Follow Up Recommendations  Home health PT;Other (comment)     Equipment Recommendations  Rolling walker with 5" wheels;3in1 (PT);Wheelchair (measurements PT);Wheelchair cushion (measurements PT)    Recommendations for Other Services       Precautions / Restrictions Precautions Precautions: Back Precaution Comments: No bending, arching, twisting; Required Braces or Orthoses: Spinal Brace Spinal Brace: Other (comment);Applied in sitting position Spinal Brace Comments: LSO (apply/remove while sitting; may remove in bed; may ambulate to bathroom without brace; may remove brace to shower) Restrictions Weight Bearing Restrictions: No Other Position/Activity  Restrictions: L LE brace PRN per pt - not worn today.  stated he wears it when he walks outside or in community.  declined in facility    Mobility  Bed Mobility Overal bed mobility: Modified Independent Bed Mobility: Rolling;Sidelying to Sit;Sit to Sidelying Rolling: Modified independent (Device/Increase time) Sidelying to sit: Modified independent (Device/Increase time)     Sit to sidelying: Supervision General bed mobility comments: mild increased effort to perform due to increased pain but no physical assist required; HOB slightly elevated  Transfers Overall transfer level: Needs assistance Equipment used: Rolling walker (2 wheeled) Transfers: Sit to/from Stand Sit to Stand: Min guard         General transfer comment: STS transfer performed with CGA, requires slight elevation of bed  Ambulation/Gait Ambulation/Gait assistance: Min guard Gait Distance (Feet): 205 Feet Assistive device: Rolling walker (2 wheeled) Gait Pattern/deviations: Step-through pattern Gait velocity: functional   General Gait Details: equal weight acceptance, slight trunk flexion due to walker height, declined correction of height   Stairs             Wheelchair Mobility    Modified Rankin (Stroke Patients Only)       Balance Overall balance assessment: Needs assistance Sitting-balance support: No upper extremity supported;Feet supported Sitting balance-Leahy Scale: Good Sitting balance - Comments: steady sitting reaching within BOS, LAQs   Standing balance support: Bilateral upper extremity supported;During functional activity;No upper extremity supported Standing balance-Leahy Scale: Fair Standing balance comment: able to static stand without UE support, requires BUE support for ambulation                            Cognition Arousal/Alertness: Awake/alert Behavior During Therapy: WFL for tasks assessed/performed Overall Cognitive Status:  Within Functional Limits for  tasks assessed                                 General Comments: eager to participate with PT      Exercises General Exercises - Lower Extremity Ankle Circles/Pumps: Strengthening;AROM;Both;10 reps Long Arc Quad: Strengthening;Both;10 reps;Seated Heel Slides: AAROM;Strengthening;Both;10 reps;Supine Hip ABduction/ADduction: Strengthening;Both;5 reps;Supine Straight Leg Raises: Strengthening;Both;10 reps;Supine Hip Flexion/Marching: Strengthening;Both;10 reps;Seated;Standing    General Comments        Pertinent Vitals/Pain Pain Assessment: 0-10 Pain Score: 4  Pain Location: back pain with movement Pain Descriptors / Indicators: Sore;Constant;Cramping;Grimacing;Guarding Pain Intervention(s): Limited activity within patient's tolerance;Monitored during session;Repositioned    Home Living                      Prior Function            PT Goals (current goals can now be found in the care plan section) Acute Rehab PT Goals Patient Stated Goal: To have less pain in my back PT Goal Formulation: With patient Time For Goal Achievement: 09/26/19 Potential to Achieve Goals: Fair Additional Goals Additional Goal #1: Pt able to propel w/c modified independently x150 feet. Progress towards PT goals: Progressing toward goals    Frequency    BID      PT Plan Current plan remains appropriate    Co-evaluation              AM-PAC PT "6 Clicks" Mobility   Outcome Measure  Help needed turning from your back to your side while in a flat bed without using bedrails?: None Help needed moving from lying on your back to sitting on the side of a flat bed without using bedrails?: None Help needed moving to and from a bed to a chair (including a wheelchair)?: A Little Help needed standing up from a chair using your arms (e.g., wheelchair or bedside chair)?: A Little Help needed to walk in hospital room?: A Little Help needed climbing 3-5 steps with a  railing? : A Lot 6 Click Score: 19    End of Session Equipment Utilized During Treatment: Gait belt;Back brace Activity Tolerance: Patient tolerated treatment well Patient left: in bed;with call bell/phone within reach;with bed alarm set;Other (comment) Nurse Communication: Mobility status;Precautions;Other (comment) PT Visit Diagnosis: Other abnormalities of gait and mobility (R26.89);Muscle weakness (generalized) (M62.81);History of falling (Z91.81);Difficulty in walking, not elsewhere classified (R26.2);Pain     Time: [id number] PT Time Calculation (min) (ACUTE ONLY): 14 min  Charges:  $Therapeutic Exercise: 8-22 mins                     Janna Arch, PT, DPT    Janna Arch 09/14/2019, 4:53 PM

## 2019-09-14 NOTE — Progress Notes (Signed)
Procedure: L2-3 XLIF with L3-S1 revision Procedure date: 09/11/2019 Diagnosis: Adjacent segment disease with kyphosis  History: Trevor [name] is s/p Aborted L2-3 XLIF with L3-S1 revision   POD3:  Improvement since yesterday. Received last antibiotic yesterday evening. Persistently orthostatic since surgery with complaints of dizziness with standing has resolved after 1 unit of blood and 2 liters of fluid.He was able to ambulate with PT without issue. Ate dinner last night and breakfast this morning. Still complaining of diffuse abdominal discomfort. No good BM yet.  Pain 3-4/10. Voiding without issue.   POD2: Continues to recovery well. Pain 4/10. He was able to sleep well last night and that has improved things overall. Voiding and eating without issue but appetite is low due to abdominal discomfort. Had small BM yesterday but it did not relieve discomfort.  Continues abx.  Has not yet ambulated without issue - dizziness. Complains of mild headache but feels less "swimmy" headed.  Drain output 35 overnight.    POD1: He is doing fair. Complains of 5/10 back pain. Unable to complete xrays this AM due to dizziness. Complains of mild headache and nausea, but feels this is medication and empty stomach related. Denies any lower extremity complaints.    Right drain output 40.  Left drain output 60  POD0: Procedure aborted due to potential contamination from hole in C-arm drape.   Physical Exam: Vitals:   09/14/19 0423 09/14/19 0724  BP: 122/62 134/70  Pulse: 88 89  Resp: 19 17  Temp: 98.2 F (36.8 C) 98.4 F (36.9 C)  SpO2: 97% 99%    AA Ox3 Strength: 5/5 throughout lower extremities.  Sensation: intact and symmetric throughout lower extremities.  Skin: Incision dressing has scant drainage. 2 dressings at drain site are clean and dry. Glue intact at lateral incision site.   Abdomen soft with mild, diffuse tenderness to palpation.   Data:  Recent Labs  Lab 09/13/19 1553  09/14/19 0545  NA 133*  --   K 3.9  --   CL 100  --   CO2 22  --   BUN 15  --   CREATININE 0.74 0.63  GLUCOSE 141*  --   CALCIUM 8.3*  --    Recent Labs  Lab 09/13/19 1553  AST 52*  ALT 30  ALKPHOS 70     Recent Labs  Lab 09/13/19 1553  WBC 12.4*  HGB 8.1*  HCT 25.3*  PLT 100*   Recent Labs  Lab 09/11/19 0624  APTT 30  INR 1.2         Other tests/results:  EXAM: LUMBAR SPINE - 1 VIEW 09/12/2019  COMPARISON:  Intraoperative films from 09/11/2019 as well as preop films from 07/07/2018  FINDINGS: New interbody fusion at L2-3 is noted. Previously seen pedicle screws have been removed with the exception of screws at S1. Portions of these screws remain within the vertebral body. Stable interbody fusion at L3-4, L4-5 and L5-S1 is seen.  IMPRESSION: New interbody fusion at L2-3.  Assessment/Plan:  Trevor [name] is POD3 s/p aborted L2-3 Xlif with L3-S1 revision. He is doing well.  - mobilize - pain control - DVT prophylaxis - lovenox - PTOT - BM- lactulose - Abx treatment completed  Marin Olp PA-C Department of Neurosurgery

## 2019-09-15 MED ORDER — SORBITOL 70 % SOLN
960.0000 mL | TOPICAL_OIL | Freq: Once | ORAL | Status: AC
  Administered 2019-09-15: 960 mL via RECTAL
  Filled 2019-09-15: qty 473

## 2019-09-15 NOTE — Progress Notes (Signed)
PT Cancellation Note  Patient Details Name: Trevor [name] MRN: [id number] DOB: 1946/05/23   Cancelled Treatment:    Reason Eval/Treat Not Completed: Other (comment)(patient just got breakfast and would like to eat. Will attempt again later this morning)   Everlean Alstrom. Graylon Good, PT, DPT 09/15/19, 8:41 AM

## 2019-09-15 NOTE — Progress Notes (Signed)
Procedure: L2-3 XLIF with L3-S1 revision Procedure date: 09/11/2019 Diagnosis: Adjacent segment disease with kyphosis  History: Trevor [name] is s/p L2-3 XLIF with L3-S1 revision (posterior instrumentation aborted for possible contamination)  POD4:  His pain is improved.  He has been walking well.  He has not had a BM, and is having abdominal discomfort and some mild nausea.  He has started magnesium citrate but doesn't think he can tolerate it. He had a nosebleed this AM but it is controlled.  POD3:  Improvement since yesterday. Received last antibiotic yesterday evening. Persistently orthostatic since surgery with complaints of dizziness with standing has resolved after 1 unit of blood and 2 liters of fluid.He was able to ambulate with PT without issue. Ate dinner last night and breakfast this morning. Still complaining of diffuse abdominal discomfort. No good BM yet.  Pain 3-4/10. Voiding without issue.   POD2: Continues to recovery well. Pain 4/10. He was able to sleep well last night and that has improved things overall. Voiding and eating without issue but appetite is low due to abdominal discomfort. Had small BM yesterday but it did not relieve discomfort.  Continues abx.  Has not yet ambulated without issue - dizziness. Complains of mild headache but feels less "swimmy" headed.  Drain output 35 overnight.    POD1: He is doing fair. Complains of 5/10 back pain. Unable to complete xrays this AM due to dizziness. Complains of mild headache and nausea, but feels this is medication and empty stomach related. Denies any lower extremity complaints.   POD0: Procedure aborted due to potential contamination from hole in C-arm drape.   Physical Exam: Vitals:   09/14/19 2324 09/15/19 0758  BP: 139/69 130/66  Pulse: 82 86  Resp: 16 18  Temp: 99.4 F (37.4 C) 99 F (37.2 C)  SpO2: 98% 98%    AA Ox3 Strength: 5/5 throughout lower extremities except R knee extension 4/5.  Sensation: intact  and symmetric throughout lower extremities.  Skin: Incision dressing c/d/i  Glue intact at lateral incision site.   Abdomen soft with mild, diffuse tenderness to palpation.   Data:  Recent Labs  Lab 09/13/19 1553 09/14/19 0545  NA 133*  --   K 3.9  --   CL 100  --   CO2 22  --   BUN 15  --   CREATININE 0.74 0.63  GLUCOSE 141*  --   CALCIUM 8.3*  --    Recent Labs  Lab 09/13/19 1553  AST 52*  ALT 30  ALKPHOS 70     Recent Labs  Lab 09/13/19 1553 09/14/19 0545  WBC 12.4*  --   HGB 8.1* 8.2*  HCT 25.3* 24.4*  PLT 100*  --    Recent Labs  Lab 09/11/19 0624  APTT 30  INR 1.2         Other tests/results:  EXAM: LUMBAR SPINE - 1 VIEW 09/12/2019  COMPARISON:  Intraoperative films from 09/11/2019 as well as preop films from 07/07/2018  FINDINGS: New interbody fusion at L2-3 is noted. Previously seen pedicle screws have been removed with the exception of screws at S1. Portions of these screws remain within the vertebral body. Stable interbody fusion at L3-4, L4-5 and L5-S1 is seen.  IMPRESSION: New interbody fusion at L2-3.  Assessment/Plan:  LYLE WEYER is POD4 s/p L2-3 Xlif with L3-S1 revision (posterior implants removed for possible contamination. He is doing well neurologically, but is constipated.  He is getting uncomfortable from that.  -  mobilize - pain control - DVT prophylaxis - lovenox - PTOT - BM- SMOG enema - Abx treatment completed  Meade Maw MD Department of Neurosurgery

## 2019-09-15 NOTE — Progress Notes (Signed)
Patient developed a nose bleed this am. Pressure applied, patient in upright position; gradually subsided

## 2019-09-15 NOTE — Progress Notes (Signed)
PT Cancellation Note  Patient Details Name: Trevor [name] MRN: [id number] DOB: 05-06-1946   Cancelled Treatment:    Reason Eval/Treat Not Completed: Fatigue/lethargy limiting ability to participate;Other (comment)(patient lying on bed without sheet, states he will not be able to participate in physical therapy due to recently being given an enema that has made him feel terrible, nausea, and unable to control bowels. Will return at another time/date as able.)   Everlean Alstrom. Graylon Good, PT, DPT 09/15/19, 2:50 PM

## 2019-09-15 NOTE — Progress Notes (Signed)
Physical Therapy Treatment Patient Details Name: Trevor [name] MRN: [id number] DOB: May 22, 1946 Today's Date: 09/15/2019    History of Present Illness Trevor [name] is a 73 y.o. male admitted for L2-3 XLIF and revision of posterior instrumentation. As per Trevor [name] note, this procedure was complicated by a possible breach in the surgical field due to a hole identified in the drape of the C arm. All the hardware was removed except two screws which fractured. Pt is now POD #1 and being followed by infectious disease for antibiotic management. 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    PT Comments    Patient tolerated treatment well and continues to make progress towards goals. Patient was able to ambulate further than last treatment session, required less assistance for mobility, and was able to transfer without the bed elevated. He was also able to participate in some balance and marching exercises to address fall risk. Patient continues to be limited by pain, poor posture, quick fatigue, imbalance, and weakness. Patient would benefit from continued physical therapy to address remaining impairments and functional limitations to work towards stated goals and return to PLOF or maximal functional independence.     Follow Up Recommendations  Home health PT     Equipment Recommendations  Rolling walker with 5" wheels;3in1 (PT);Wheelchair (measurements PT);Wheelchair cushion (measurements PT)    Recommendations for Other Services       Precautions / Restrictions Precautions Precautions: Back Precaution Comments: No bending, arching, twisting; Required Braces or Orthoses: Spinal Brace Spinal Brace: Other (comment);Applied in sitting position Spinal Brace Comments: LSO (apply/remove while sitting; may remove in bed; may ambulate to bathroom without brace; may remove  brace to shower) Restrictions Weight Bearing Restrictions: No Other Position/Activity Restrictions: L LE brace PRN per pt - not worn today.  stated he wears it when he walks outside or in community.  declined in facility    Mobility  Bed Mobility Overal bed mobility: Modified Independent Bed Mobility: Rolling;Sidelying to Sit;Sit to Sidelying Rolling: Modified independent (Device/Increase time) Sidelying to sit: Modified independent (Device/Increase time)     Sit to sidelying: Supervision General bed mobility comments: mild increased effort to perform due to pain but no physical assist required; HOB slightly elevated  Transfers Overall transfer level: Needs assistance Equipment used: Rolling walker (2 wheeled) Transfers: Sit to/from Stand Sit to Stand: Supervision         General transfer comment: Performed sit <> stand x 3 reps from/to bed/chair. Patient able to don back brace independently and correctly.  Ambulation/Gait Ambulation/Gait assistance: Supervision Gait Distance (Feet): 260 Feet Assistive device: Rolling walker (2 wheeled) Gait Pattern/deviations: Step-through pattern Gait velocity: functional   General Gait Details: equal weight acceptance, slight trunk flexion due to walker height, declined correction of height (states he walks stooped normally).   Stairs             Wheelchair Mobility    Modified Rankin (Stroke Patients Only)       Balance Overall balance assessment: Needs assistance Sitting-balance support: No upper extremity supported;Feet supported Sitting balance-Leahy Scale: Good Sitting balance - Comments: steady sitting reaching within BOS. Able to don brace.   Standing balance support: Bilateral upper extremity supported;During functional activity;No upper extremity supported Standing balance-Leahy Scale: Fair Standing balance comment: able to static stand without UE support, requires BUE support for stable ambulation. aBle to  complete marching in place without BUE support  but hands hovering over RW handles.                            Cognition Arousal/Alertness: Awake/alert Behavior During Therapy: WFL for tasks assessed/performed Overall Cognitive Status: Within Functional Limits for tasks assessed                                        Exercises Other Exercises Other Exercises: practiced standing marching x 10 each side with BUE support on RW, x 10 with no UE support but hands hovering over walker (more difficulty). Balance practice in tandem stance with touchdown UE support 15 seconds each side with close supervision (pt reports painful so decreased time to 15 seconds instead of 30 seconds). Incentive spirometer practice x 10 reps. Required cuing for proper use. Caused coughing that improved by end of 10 reps.    General Comments        Pertinent Vitals/Pain Pain Assessment: 0-10 Pain Score: 4  Pain Location: back pain, worse with movement Pain Descriptors / Indicators: Sore;Grimacing;Guarding Pain Intervention(s): Limited activity within patient's tolerance;Monitored during session;Repositioned    Home Living                      Prior Function            PT Goals (current goals can now be found in the care plan section) Acute Rehab PT Goals Patient Stated Goal: To have less pain in my back PT Goal Formulation: With patient Time For Goal Achievement: 09/26/19 Potential to Achieve Goals: Fair Additional Goals Additional Goal #1: Pt able to propel w/c modified independently x150 feet. Progress towards PT goals: Progressing toward goals    Frequency    BID      PT Plan Current plan remains appropriate    Co-evaluation              AM-PAC PT "6 Clicks" Mobility   Outcome Measure  Help needed turning from your back to your side while in a flat bed without using bedrails?: None Help needed moving from lying on your back to sitting on the side  of a flat bed without using bedrails?: None Help needed moving to and from a bed to a chair (including a wheelchair)?: A Little Help needed standing up from a chair using your arms (e.g., wheelchair or bedside chair)?: A Little Help needed to walk in hospital room?: A Little Help needed climbing 3-5 steps with a railing? : A Lot 6 Click Score: 19    End of Session Equipment Utilized During Treatment: Gait belt;Back brace Activity Tolerance: Patient tolerated treatment well Patient left: with call bell/phone within reach;with bed alarm set;in bed Nurse Communication: Mobility status;Precautions PT Visit Diagnosis: Other abnormalities of gait and mobility (R26.89);Muscle weakness (generalized) (M62.81);History of falling (Z91.81);Difficulty in walking, not elsewhere classified (R26.2);Pain Pain - part of body: (back)     Time: [id number] PT Time Calculation (min) (ACUTE ONLY): 30 min  Charges:  $Gait Training: 8-22 mins $Therapeutic Activity: 8-22 mins                     Everlean Alstrom. Graylon Good, PT, DPT 09/15/19, 11:33 AM

## 2019-09-15 NOTE — Progress Notes (Signed)
MD notified of patient nosebleed. Instructed to continue to sit in upright position and pinch nostril until the bleed subsides.

## 2019-09-16 ENCOUNTER — Other Ambulatory Visit: Payer: Self-pay | Admitting: Neurosurgery

## 2019-09-16 MED ORDER — METHOCARBAMOL 500 MG PO TABS: 500.0000 mg | ORAL_TABLET | Freq: Four times a day (QID) | ORAL | 0 refills | Status: DC

## 2019-09-16 MED ORDER — OXYCODONE HCL 5 MG PO TABS: 5.0000 mg | ORAL_TABLET | ORAL | 0 refills | Status: DC | PRN

## 2019-09-16 NOTE — Care Management Important Message (Signed)
Important Message  Patient Details  Name: Trevor [name] MRN: [id number] Date of Birth: [birth year]   Medicare Important Message Given:  Yes     Juliann Pulse A Kennen Stammer 09/16/2019, 10:36 AM

## 2019-09-16 NOTE — Progress Notes (Signed)
Physical Therapy Treatment Patient Details Name: Trevor [name] MRN: [id number] DOB: [birth year] Today's Date: 09/16/2019    History of Present Illness Trevor [name] is a 73 y.o. male admitted for L2-3 XLIF and revision of posterior instrumentation. As per Dr. Rhea [name] note, this procedure was complicated by a possible breach in the surgical field due to a hole identified in the drape of the C arm. All the hardware was removed except two screws which fractured. Pt is now POD #1 and being followed by infectious disease for antibiotic management. 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    PT Comments    Patient performing all functional mobility tasks at close sup level with RW this date; verbalizes and maintains adherence to all back precautions and voices comfort/confidence in ability to manage at discharge. No further questions/concerns at this time.    Follow Up Recommendations  Home health PT     Equipment Recommendations  Rolling walker with 5" wheels;3in1 (PT);Wheelchair (measurements PT);Wheelchair cushion (measurements PT)    Recommendations for Other Services       Precautions / Restrictions Precautions Precautions: Back Spinal Brace Comments: LSO (apply/remove while sitting; may remove in bed; may ambulate to bathroom without brace; may remove brace to shower) Restrictions Weight Bearing Restrictions: No    Mobility  Bed Mobility Overal bed mobility: Modified Independent             General bed mobility comments: good integration of log rolling technique  Transfers Overall transfer level: Needs assistance Equipment used: Rolling walker (2 wheeled) Transfers: Sit to/from Stand Sit to Stand: Supervision         General transfer comment: performed with and without assist device; heavy use of UEs to assist with lift off and stabilization  (tends to 'walk up' LEs with hands at times)  Ambulation/Gait Ambulation/Gait assistance: Supervision Gait Distance (Feet): 260 Feet Assistive device: Rolling walker (2 wheeled)       General Gait Details: reciprocal stepping pattern with slow, but steady, cadence and overall gait performance.  Mild trunk flexion.  good control and overall comfort/confidence with gait efforts.   Stairs             Wheelchair Mobility    Modified Rankin (Stroke Patients Only)       Balance Overall balance assessment: Needs assistance Sitting-balance support: No upper extremity supported;Feet supported Sitting balance-Leahy Scale: Good     Standing balance support: Bilateral upper extremity supported Standing balance-Leahy Scale: Fair                              Cognition   Behavior During Therapy: WFL for tasks assessed/performed Overall Cognitive Status: Within Functional Limits for tasks assessed                                        Exercises Other Exercises Other Exercises: Upper body dressing, set up/sup; lower body dressing, min assist to thread LEs, close sup for sit/stand and static standing balance.  Donning LSO, mod indep. Other Exercises: Verbally reviewed car transfer technique; patient voiced indep awareness of appropriate technique.    General Comments        Pertinent Vitals/Pain Pain Assessment: 0-10 Pain Score: 3  Pain Location: back Pain Descriptors / Indicators:  Aching;Guarding;Grimacing Pain Intervention(s): Limited activity within patient's tolerance;Monitored during session;Repositioned    Home Living                      Prior Function            PT Goals (current goals can now be found in the care plan section) Acute Rehab PT Goals Patient Stated Goal: To have less pain in my back PT Goal Formulation: With patient Time For Goal Achievement: 09/26/19 Potential to Achieve Goals: Fair Progress towards PT  goals: Progressing toward goals    Frequency    BID      PT Plan Current plan remains appropriate    Co-evaluation              AM-PAC PT "6 Clicks" Mobility   Outcome Measure  Help needed turning from your back to your side while in a flat bed without using bedrails?: None Help needed moving from lying on your back to sitting on the side of a flat bed without using bedrails?: None Help needed moving to and from a bed to a chair (including a wheelchair)?: None Help needed standing up from a chair using your arms (e.g., wheelchair or bedside chair)?: None Help needed to walk in hospital room?: None Help needed climbing 3-5 steps with a railing? : A Little 6 Click Score: 23    End of Session Equipment Utilized During Treatment: Gait belt;Back brace Activity Tolerance: Patient tolerated treatment well Patient left: with call bell/phone within reach;with bed alarm set;in bed Nurse Communication: Mobility status PT Visit Diagnosis: Other abnormalities of gait and mobility (R26.89);Muscle weakness (generalized) (M62.81);History of falling (Z91.81);Difficulty in walking, not elsewhere classified (R26.2);Pain Pain - Right/Left: Right     Time: [id number] PT Time Calculation (min) (ACUTE ONLY): 16 min  Charges:  $Gait Training: 8-22 mins                     Trevor [name], PT, DPT, NCS 09/16/19, 10:17 AM [phone]

## 2019-09-16 NOTE — Progress Notes (Signed)
Occupational Therapy Treatment Patient Details Name: Trevor [name] MRN: [id number] DOB: [birth year] Today's Date: 09/16/2019    History of present illness Trevor [name] is a 73 y.o. male admitted for L2-3 XLIF and revision of posterior instrumentation. As per Trevor [name] note, this procedure was complicated by a possible breach in the surgical field due to a hole identified in the drape of the C arm. All the hardware was removed except two screws which fractured. Pt is being followed by infectious disease for antibiotic management. 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   OT comments  Trevor [name] was seen for OT treatment on this date. Upon arrival to room pt awake/alert, semi-supine in bed, reporting 4-5/10 pain. Pt agreeable to OT tx. Pt instructed in adapted strategies for bathing, dressing, and grooming tasks in consideration of back precautions this date (See ADL section below for more detail). Pt declined opportunity to trial AE citing increased pain from recent PT session, but is very familiar with equipment from past hospitalizations. Pt able to return verbalize understanding of strategies discussed, and identify potential barriers/limitations to his ADL mgt upon return home. Pt and provider problem solved safety and falls prevention strategies to support safe ADL mgt upon DC home. Pt making good progress toward goals and continues to benefit from skilled OT services to maximize return to PLOF and minimize risk of future falls, injury, caregiver burden, and readmission. Will continue to follow POC as written. Discharge recommendation remains appropriate.    Follow Up Recommendations  Home health OT    Equipment Recommendations  3 in 1 bedside commode    Recommendations for Other Services      Precautions / Restrictions Precautions Precautions: Back Precaution  Comments: No bending, arching, twisting; Required Braces or Orthoses: Spinal Brace Spinal Brace: Other (comment);Applied in sitting position Spinal Brace Comments: LSO (apply/remove while sitting; may remove in bed; may ambulate to bathroom without brace; may remove brace to shower) Restrictions Weight Bearing Restrictions: No Other Position/Activity Restrictions: L LE brace PRN per pt - not worn today.  stated he wears it when he walks outside or in community.  declined in facility       Mobility Bed Mobility Overal bed mobility: Modified Independent Bed Mobility: Rolling;Sidelying to Sit;Sit to Sidelying Rolling: Modified independent (Device/Increase time) Sidelying to sit: Modified independent (Device/Increase time)     Sit to sidelying: Supervision General bed mobility comments: good integration of log rolling technique  Transfers Overall transfer level: Needs assistance Equipment used: Rolling walker (2 wheeled) Transfers: Sit to/from Stand Sit to Stand: Supervision         General transfer comment: performed with and without assist device; heavy use of UEs to assist with lift off and stabilization (tends to 'walk up' LEs with hands at times)    Balance Overall balance assessment: Needs assistance Sitting-balance support: No upper extremity supported;Feet supported Sitting balance-Leahy Scale: Good     Standing balance support: Bilateral upper extremity supported Standing balance-Leahy Scale: Fair                             ADL either performed or assessed with clinical judgement   ADL Overall ADL's : Needs assistance/impaired Eating/Feeding: Sitting;Set up   Grooming: Sitting;Set up Grooming Details (indicate cue type and reason): Pt and provider discussed adapted strategies for grooming tasks including use of cups  for oral care to minimize bending toward sink.     Lower Body Bathing: Sitting/lateral leans;With adaptive equipment;Supervison/  safety;Set up Lower Body Bathing Details (indicate cue type and reason): Pt and provider discussed strategies for lower body bathing in consideration of back precautions. Per pt, he sits at baseline to shower and has long handle shower brush to wash feet/lower legs. Discussed strategies to minimize twisting and bending while in the shower with strict instructions to follow surgeon's recommendation for bathing in regards to taking a full shower or wetting incision site. Upper Body Dressing : Sitting;Independent   Lower Body Dressing: Sit to/from stand;Set up;Supervision/safety;With adaptive equipment Lower Body Dressing Details (indicate cue type and reason): Pt and provider discussed use of long handle reacher and sock aid for LB dressing in consideration of back precautions. Pt familiar with using LHR and sock aid from prior hospitalizations, but states he does not typically use a reacher at home for anything but picking items up off the floor. Therapist reviewed adapted dressing strategies demonstrating how to adhere to back precautions. Pt declined to trial this date as he had already gotten dressed duirng his physical therapy session.               General ADL Comments: Hypotension has improved. Pt doing well with functional mobility/transfers since initial OT evaluation. Pt is mod I for bed mobility using log-roll technique, and supervision for functional mobility using a 2WW.     Vision       Perception     Praxis      Cognition Arousal/Alertness: Awake/alert Behavior During Therapy: WFL for tasks assessed/performed Overall Cognitive Status: Within Functional Limits for tasks assessed                                          Exercises Other Exercises Other Exercises: Verbally reviewed car transfer technique; patient voiced indep awareness of appropriate technique. Other Exercises: Pt and provider reviewed adapted bathing, dressing, and grooming strategies in  consideration of back precautions this date. See ADL section for detail.   Shoulder Instructions       General Comments      Pertinent Vitals/ Pain       Pain Assessment: 0-10 Pain Score: 5  Pain Location: back Pain Descriptors / Indicators: Aching;Guarding;Grimacing Pain Intervention(s): Limited activity within patient's tolerance;Monitored during session  Home Living                                          Prior Functioning/Environment              Frequency  Min 1X/week        Progress Toward Goals  OT Goals(current goals can now be found in the care plan section)  Progress towards OT goals: Progressing toward goals  Acute Rehab OT Goals Patient Stated Goal: To have less pain in my back OT Goal Formulation: With patient Time For Goal Achievement: 09/26/19 Potential to Achieve Goals: Good  Plan Discharge plan remains appropriate;Frequency remains appropriate    Co-evaluation                 AM-PAC OT "6 Clicks" Daily Activity     Outcome Measure   Help from another person eating meals?: None Help from another person taking care  of personal grooming?: A Little Help from another person toileting, which includes using toliet, bedpan, or urinal?: A Little Help from another person bathing (including washing, rinsing, drying)?: A Little Help from another person to put on and taking off regular upper body clothing?: None Help from another person to put on and taking off regular lower body clothing?: A Little 6 Click Score: 20    End of Session    OT Visit Diagnosis: Other abnormalities of gait and mobility (R26.89);Pain Pain - Right/Left: (Low back both sides) Pain - part of body: (low back)   Activity Tolerance Patient tolerated treatment well;Patient limited by pain   Patient Left in bed;with call bell/phone within reach;with bed alarm set   Nurse Communication          Time: [id number] OT Time Calculation (min): 19  min  Charges: OT General Charges $OT Visit: 1 Visit OT Treatments $Self Care/Home Management : 8-22 mins  Shara Blazing, M.S., OTR/L Ascom: [phone] 09/16/19, 11:22 AM

## 2019-09-16 NOTE — Discharge Summary (Signed)
Procedure: L2-3 XLIF with L3-S1 revision Procedure date: 09/11/2019 Diagnosis: Adjacent segment disease with kyphosis  History: Trevor [name] is s/p L2-3 XLIF with L3-S1 revision (posterior instrumentation aborted for possible contamination)   POD5: Had BM after enema yesterday and abdominal discomfort has improved. Continues to ambulate, void, and eat without issue. Pain when laying 2/10, when moving 5/10.   POD4:  His pain is improved.  He has been walking well.  He has not had a BM, and is having abdominal discomfort and some mild nausea.  He has started magnesium citrate but doesn't think he can tolerate it. He had a nosebleed this AM but it is controlled.  POD3:  Improvement since yesterday. Received last antibiotic yesterday evening. Persistently orthostatic since surgery with complaints of dizziness with standing has resolved after 1 unit of blood and 2 liters of fluid.He was able to ambulate with PT without issue. Ate dinner last night and breakfast this morning. Still complaining of diffuse abdominal discomfort. No good BM yet.  Pain 3-4/10. Voiding without issue.   POD2: Continues to recovery well. Pain 4/10. He was able to sleep well last night and that has improved things overall. Voiding and eating without issue but appetite is low due to abdominal discomfort. Had small BM yesterday but it did not relieve discomfort.  Continues abx.  Has not yet ambulated without issue - dizziness. Complains of mild headache but feels less "swimmy" headed.  Drain output 35 overnight.    POD1: He is doing fair. Complains of 5/10 back pain. Unable to complete xrays this AM due to dizziness. Complains of mild headache and nausea, but feels this is medication and empty stomach related. Denies any lower extremity complaints.   POD0: Procedure aborted due to potential contamination from hole in C-arm drape.   Physical Exam: Vitals:   09/15/19 2312 09/16/19 0735  BP: 134/61 131/61  Pulse: 81 77  Resp:  16   Temp: 99.1 F (37.3 C) 99.9 F (37.7 C)  SpO2: 99% 95%    AA Ox3 Strength: 5/5 throughout lower extremities  Sensation: intact and symmetric throughout lower extremities.  Skin: Incision dressing clean and dry.  Glue intact at lateral incision site.     Data:  Recent Labs  Lab 09/13/19 1553 09/14/19 0545  NA 133*  --   K 3.9  --   CL 100  --   CO2 22  --   BUN 15  --   CREATININE 0.74 0.63  GLUCOSE 141*  --   CALCIUM 8.3*  --    Recent Labs  Lab 09/13/19 1553  AST 52*  ALT 30  ALKPHOS 70     Recent Labs  Lab 09/13/19 1553 09/14/19 0545  WBC 12.4*  --   HGB 8.1* 8.2*  HCT 25.3* 24.4*  PLT 100*  --    Recent Labs  Lab 09/11/19 0624  APTT 30  INR 1.2         Other tests/results:  EXAM: LUMBAR SPINE - 1 VIEW 09/12/2019  COMPARISON:  Intraoperative films from 09/11/2019 as well as preop films from 07/07/2018  FINDINGS: New interbody fusion at L2-3 is noted. Previously seen pedicle screws have been removed with the exception of screws at S1. Portions of these screws remain within the vertebral body. Stable interbody fusion at L3-4, L4-5 and L5-S1 is seen.  IMPRESSION: New interbody fusion at L2-3.  Assessment/Plan:  [name] is POD5 s/p L2-3 Xlif with L3-S1 revision (posterior implants removed for possible  contamination. He is recovering well, and ready to go home. Will continue post op pain control with tylenol, muscle relaxer, and pain medication. He is scheduled to follow up with [name] on 10/15 to monitor progress.   Marin Olp PA-C Department of Neurosurgery

## 2019-09-16 NOTE — TOC Transition Note (Signed)
Transition of Care [facility]) - CM/SW Discharge Note   Patient Details  Name: LUM PFAB MRN: [id number] Date of Birth: 10-15-1946  [facility] CM/SW Contact:  Su Hilt, RN Phone Number: 09/16/2019, 11:36 AM   Clinical Narrative:    Patient discharging home today with [facility], NO DME needs  Brother will be providing support as well as friends and neighbors No additional needs   Final next level of care: Home w Home Health Services Barriers to Discharge: Barriers Resolved   Patient Goals and CMS Choice Patient states their goals for this hospitalization and ongoing recovery are:: go home      Discharge Placement                       Discharge Plan and Services   Discharge Planning Services: CM Consult            DME Arranged: N/A         HH Arranged: PT, OT, Nurse's Aide [place] Agency: [facility] Date [place]: 09/12/19 Time [place]: 1601 Representative spoke with at [place]: [place] (SDOH) Interventions     Readmission Risk Interventions No flowsheet data found.

## 2019-09-16 NOTE — Discharge Instructions (Signed)
NEUROSURGERY DISCHARGE INSTRUCTIONS  The following are instructions to help in your recovery once you have been discharged from the hospital. Even if you feel well, it is important that you follow these activity guidelines.  What to do after you leave the hospital:  Recommended diet:  Increase protein intake to promote wound healing. You may return to your usual diet. However, you may experience discomfort when swallowing in the first month after your surgery. This is normal. You may find that softer foods are more comfortable for you to swallow. Be sure to stay hydrated.   Recommended activity: No bending, lifting, or twisting (BLT). Avoid lifting objects heavier than 10 pounds (gallon milk jug). Where possible, avoid household activities that involve lifting, bending, reaching, pushing, or pulling such as laundry, vacuuming, grocery shopping, and childcare. Try to arrange for help from friends and family for these activities while you heal.   Increase physical activity slowly as tolerated. Taking short walks is encouraged, but avoid strenuous exercise. Do not jog, run, bicycle, lift weights, or participate in any other exercises unless specifically allowed by your doctor.   You should not drive until cleared by your doctor.   Until released by your doctor, you should not return to work or school. You should rest at home and let your body heal.   You may shower the day after your surgery. After showering, lightly dab your incision dry. Do not take a tub bath or go swimming until approved by your doctor at your follow-up appointment.   If you smoke, we strongly recommend that you quit. Smoking has been proven to interfere with normal bone healing and will dramatically reduce the success rate of your surgery. Please contact [facility] (800-QUIT-NOW) and use the resources at www.[facility].com for assistance in stopping smoking.   Medications  Do not restart Aspirin until seven days after  surgery  * Do not take anti-inflammatory medications for 3 months after surgery (naproxen [Aleve], ibuprofen [Advil, Motrin], celecoxib [Celebrex], etc.).   You may restart home medications.   Wound Care Instructions  If you have a dressing on your incision, remove it two days after your surgery. Keep your incision area clean and dry.   If you have staples or stitches on your incision, you should have a follow up scheduled for removal. If you do not have staples or stitches, you will have steri-strips (small pieces of surgical tape) or Dermabond glue. The steri-strips/glue should begin to peel away within about a week (it is fine if the steri-strips fall off before then). If the strips are still in place one week after your surgery, you may gently remove them.    Please Report any of the following: Should you experience any of the following, contact us immediately:   New numbness or weakness   Pain that is progressively getting worse, and is not relieved by your pain medication, muscle relaxers, rest, and warm compresses   Bleeding, redness, swelling, pain, or drainage from surgical incision   Chills or flu-like symptoms   Fever greater than 101.0 F (38.3 C)   Inability to eat, drink fluids, or take medications   Problems with bowel or bladder functions   Difficulty breathing or shortness of breath   Warmth, tenderness, or swelling in your calf    Additional Follow up appointments During office hours (Monday-Friday 9 am to 5 pm), please call your physician at [phone] and ask for Berdine Addison.   After hours and weekends, please call the Marketing executive at  [phone] and ask for the Neurosurgery Resident On Call  ° °For a life-threatening emergency, call 911  ° ° ° ° ° °

## 2019-09-27 ENCOUNTER — Other Ambulatory Visit
Admission: RE | Admit: 2019-09-27 | Discharge: 2019-09-27 | Disposition: A | Discharge status: Home / Self Care | Payer: Medicare Other | Source: Ambulatory Visit | Attending: Neurosurgery | Admitting: Neurosurgery

## 2019-09-27 DIAGNOSIS — Z20828 Contact with and (suspected) exposure to other viral communicable diseases: Secondary | ICD-10-CM | POA: Insufficient documentation

## 2019-09-27 DIAGNOSIS — Z01812 Encounter for preprocedural laboratory examination: Secondary | ICD-10-CM | POA: Diagnosis present

## 2019-09-27 LAB — TYPE AND SCREEN
ABO/RH(D): A POS
ABO/RH(D): A POS
Antibody Screen: NEGATIVE
Antibody Screen: NEGATIVE
Extend sample reason: TRANSFUSED
Unit division: 0

## 2019-09-27 LAB — BPAM RBC
Blood Product Expiration Date: 202010082359
ISSUE DATE / TIME: 202010021850
Unit Type and Rh: 600

## 2019-09-27 LAB — SARS CORONAVIRUS 2 (TAT 6-24 HRS): SARS Coronavirus 2: NEGATIVE

## 2019-09-30 NOTE — Progress Notes (Signed)
The patient was called to discuss medications prior to his upcoming surgery. He was aware of which medications to stop taking prior to surgery and those that he should take the morning of. Questions encouraged and answered. No questions at this time.

## 2019-10-02 ENCOUNTER — Inpatient Hospital Stay: Payer: Medicare Other | Admitting: Anesthesiology

## 2019-10-02 ENCOUNTER — Encounter
Admission: RE | Disposition: A | Discharge status: Home Health | Payer: Self-pay | Source: Home / Self Care | Attending: Neurosurgery

## 2019-10-02 ENCOUNTER — Other Ambulatory Visit: Payer: Self-pay

## 2019-10-02 ENCOUNTER — Inpatient Hospital Stay: Payer: Medicare Other

## 2019-10-02 ENCOUNTER — Inpatient Hospital Stay
Admission: RE | Admit: 2019-10-02 | Discharge: 2019-10-06 | DRG: 457 | Disposition: A | Discharge status: Home Health | Payer: Medicare Other | Attending: Neurosurgery | Admitting: Neurosurgery

## 2019-10-02 DIAGNOSIS — Z981 Arthrodesis status: Secondary | ICD-10-CM

## 2019-10-02 DIAGNOSIS — Z419 Encounter for procedure for purposes other than remedying health state, unspecified: Principal | ICD-10-CM

## 2019-10-02 DIAGNOSIS — Z79899 Other long term (current) drug therapy: Secondary | ICD-10-CM

## 2019-10-02 DIAGNOSIS — R110 Nausea: Secondary | ICD-10-CM | POA: Diagnosis not present

## 2019-10-02 DIAGNOSIS — G9581 Conus medullaris syndrome: Secondary | ICD-10-CM | POA: Diagnosis present

## 2019-10-02 DIAGNOSIS — I10 Essential (primary) hypertension: Secondary | ICD-10-CM | POA: Diagnosis present

## 2019-10-02 DIAGNOSIS — E785 Hyperlipidemia, unspecified: Secondary | ICD-10-CM | POA: Diagnosis present

## 2019-10-02 DIAGNOSIS — N400 Enlarged prostate without lower urinary tract symptoms: Secondary | ICD-10-CM | POA: Diagnosis present

## 2019-10-02 DIAGNOSIS — M4010 Other secondary kyphosis, site unspecified: Principal | ICD-10-CM | POA: Diagnosis present

## 2019-10-02 DIAGNOSIS — Z8249 Family history of ischemic heart disease and other diseases of the circulatory system: Secondary | ICD-10-CM

## 2019-10-02 DIAGNOSIS — G4730 Sleep apnea, unspecified: Secondary | ICD-10-CM | POA: Diagnosis present

## 2019-10-02 DIAGNOSIS — Z9181 History of falling: Secondary | ICD-10-CM

## 2019-10-02 DIAGNOSIS — M5126 Other intervertebral disc displacement, lumbar region: Secondary | ICD-10-CM | POA: Diagnosis present

## 2019-10-02 DIAGNOSIS — Z881 Allergy status to other antibiotic agents status: Secondary | ICD-10-CM

## 2019-10-02 DIAGNOSIS — K219 Gastro-esophageal reflux disease without esophagitis: Secondary | ICD-10-CM | POA: Diagnosis present

## 2019-10-02 HISTORY — PX: POSTERIOR FUSION LUMBAR SPINE: SUR632

## 2019-10-02 LAB — TYPE AND SCREEN
ABO/RH(D): A POS
Antibody Screen: NEGATIVE

## 2019-10-02 SURGERY — POSTERIOR LUMBAR FUSION 2 LEVEL
Anesthesia: General | Site: Back

## 2019-10-02 MED ORDER — GLYCOPYRROLATE 0.2 MG/ML IJ SOLN
INTRAMUSCULAR | Status: AC
  Filled 2019-10-02: qty 1

## 2019-10-02 MED ORDER — SODIUM CHLORIDE 0.9 % IV SOLN
INTRAVENOUS | Status: DC | PRN
  Administered 2019-10-02: 09:00:00 30 ug/min via INTRAVENOUS

## 2019-10-02 MED ORDER — BUPIVACAINE HCL (PF) 0.5 % IJ SOLN
INTRAMUSCULAR | Status: DC | PRN
  Administered 2019-10-02: 20 mL

## 2019-10-02 MED ORDER — ACETAMINOPHEN 325 MG PO TABS
650.0000 mg | ORAL_TABLET | ORAL | Status: DC | PRN
  Administered 2019-10-05: 14:00:00 650 mg via ORAL
  Filled 2019-10-02: qty 2

## 2019-10-02 MED ORDER — METHYLPREDNISOLONE ACETATE 40 MG/ML IJ SUSP
INTRAMUSCULAR | Status: AC
  Filled 2019-10-02: qty 1

## 2019-10-02 MED ORDER — ONDANSETRON HCL 4 MG PO TABS
4.0000 mg | ORAL_TABLET | Freq: Four times a day (QID) | ORAL | Status: DC | PRN
  Administered 2019-10-03 – 2019-10-04 (×2): 4 mg via ORAL
  Filled 2019-10-02 (×2): qty 1

## 2019-10-02 MED ORDER — FENTANYL CITRATE (PF) 100 MCG/2ML IJ SOLN
INTRAMUSCULAR | Status: DC | PRN
  Administered 2019-10-02: 100 ug via INTRAVENOUS

## 2019-10-02 MED ORDER — THROMBIN 5000 UNITS EX SOLR
CUTANEOUS | Status: DC | PRN
  Administered 2019-10-02: 5000 [IU] via TOPICAL

## 2019-10-02 MED ORDER — SODIUM CHLORIDE 0.9 % IV SOLN: 250.0000 mL | INTRAVENOUS | Status: DC

## 2019-10-02 MED ORDER — ORAL CARE MOUTH RINSE
15.0000 mL | Freq: Two times a day (BID) | OROMUCOSAL | Status: DC
  Administered 2019-10-02 – 2019-10-05 (×4): 15 mL via OROMUCOSAL

## 2019-10-02 MED ORDER — ACETAMINOPHEN 650 MG RE SUPP: 650.0000 mg | RECTAL | Status: DC | PRN

## 2019-10-02 MED ORDER — PROPOFOL 10 MG/ML IV BOLUS
INTRAVENOUS | Status: AC
  Filled 2019-10-02: qty 20

## 2019-10-02 MED ORDER — VANCOMYCIN HCL 1000 MG IV SOLR
INTRAVENOUS | Status: DC | PRN
  Administered 2019-10-02: 09:00:00 1000 mg via INTRAVENOUS

## 2019-10-02 MED ORDER — DEXAMETHASONE SODIUM PHOSPHATE 10 MG/ML IJ SOLN
INTRAMUSCULAR | Status: DC | PRN
  Administered 2019-10-02: 10 mg via INTRAVENOUS

## 2019-10-02 MED ORDER — ROCURONIUM BROMIDE 100 MG/10ML IV SOLN
INTRAVENOUS | Status: DC | PRN
  Administered 2019-10-02: 5 mg via INTRAVENOUS

## 2019-10-02 MED ORDER — BUPIVACAINE LIPOSOME 1.3 % IJ SUSP
INTRAMUSCULAR | Status: AC
  Filled 2019-10-02: qty 20

## 2019-10-02 MED ORDER — DEXAMETHASONE SODIUM PHOSPHATE 10 MG/ML IJ SOLN
INTRAMUSCULAR | Status: AC
  Filled 2019-10-02: qty 1

## 2019-10-02 MED ORDER — SENNA 8.6 MG PO TABS
1.0000 | ORAL_TABLET | Freq: Two times a day (BID) | ORAL | Status: DC
  Administered 2019-10-02: 8.6 mg via ORAL
  Filled 2019-10-02: qty 1

## 2019-10-02 MED ORDER — SODIUM CHLORIDE 0.9 % IV SOLN
INTRAVENOUS | Status: DC | PRN
  Administered 2019-10-02: 40 mL

## 2019-10-02 MED ORDER — LIDOCAINE HCL (PF) 2 % IJ SOLN
INTRAMUSCULAR | Status: AC
  Filled 2019-10-02: qty 10

## 2019-10-02 MED ORDER — THROMBIN 5000 UNITS EX SOLR
CUTANEOUS | Status: AC
  Filled 2019-10-02: qty 5000

## 2019-10-02 MED ORDER — MIDAZOLAM HCL 2 MG/2ML IJ SOLN
INTRAMUSCULAR | Status: AC
  Filled 2019-10-02: qty 2

## 2019-10-02 MED ORDER — SODIUM CHLORIDE 0.9 % IV SOLN
INTRAVENOUS | Status: DC
  Administered 2019-10-02 (×2): via INTRAVENOUS

## 2019-10-02 MED ORDER — HYDROMORPHONE HCL 1 MG/ML IJ SOLN
INTRAMUSCULAR | Status: AC
  Filled 2019-10-02: qty 1

## 2019-10-02 MED ORDER — SODIUM CHLORIDE FLUSH 0.9 % IV SOLN
INTRAVENOUS | Status: AC
  Filled 2019-10-02: qty 10

## 2019-10-02 MED ORDER — MAGNESIUM CITRATE PO SOLN
1.0000 | Freq: Once | ORAL | Status: DC | PRN
  Filled 2019-10-02: qty 296

## 2019-10-02 MED ORDER — REMIFENTANIL HCL 1 MG IV SOLR
INTRAVENOUS | Status: DC | PRN
  Administered 2019-10-02: .1 ug/kg/min via INTRAVENOUS

## 2019-10-02 MED ORDER — VANCOMYCIN HCL IN DEXTROSE 1-5 GM/200ML-% IV SOLN: 1000.0000 mg | Freq: Once | INTRAVENOUS | Status: DC

## 2019-10-02 MED ORDER — OXYCODONE HCL 5 MG PO TABS: 5.0000 mg | ORAL_TABLET | Freq: Once | ORAL | Status: DC | PRN

## 2019-10-02 MED ORDER — EPINEPHRINE PF 1 MG/ML IJ SOLN
INTRAMUSCULAR | Status: AC
  Filled 2019-10-02: qty 1

## 2019-10-02 MED ORDER — DOCUSATE SODIUM 100 MG PO CAPS
100.0000 mg | ORAL_CAPSULE | Freq: Every day | ORAL | Status: DC
  Administered 2019-10-02 – 2019-10-05 (×4): 100 mg via ORAL
  Filled 2019-10-02 (×5): qty 1

## 2019-10-02 MED ORDER — TRAZODONE HCL 100 MG PO TABS
100.0000 mg | ORAL_TABLET | Freq: Every day | ORAL | Status: DC
  Administered 2019-10-02 – 2019-10-05 (×4): 100 mg via ORAL
  Filled 2019-10-02 (×6): qty 1

## 2019-10-02 MED ORDER — BUPIVACAINE HCL (PF) 0.5 % IJ SOLN
INTRAMUSCULAR | Status: AC
  Filled 2019-10-02: qty 30

## 2019-10-02 MED ORDER — LISINOPRIL 10 MG PO TABS: 10.0000 mg | ORAL_TABLET | Freq: Every day | ORAL | Status: DC

## 2019-10-02 MED ORDER — TRAZODONE HCL 50 MG PO TABS: 50.0000 mg | ORAL_TABLET | Freq: Every evening | ORAL | Status: DC | PRN

## 2019-10-02 MED ORDER — REMIFENTANIL HCL 1 MG IV SOLR
INTRAVENOUS | Status: AC
  Filled 2019-10-02: qty 1000

## 2019-10-02 MED ORDER — POLYETHYLENE GLYCOL 3350 17 G PO PACK
17.0000 g | PACK | Freq: Every day | ORAL | Status: DC | PRN
  Administered 2019-10-04: 17 g via ORAL
  Filled 2019-10-02: qty 1

## 2019-10-02 MED ORDER — HYDROMORPHONE HCL 1 MG/ML IJ SOLN
INTRAMUSCULAR | Status: AC
  Administered 2019-10-02: 0.5 mg via INTRAVENOUS
  Filled 2019-10-02: qty 1

## 2019-10-02 MED ORDER — HYDROMORPHONE HCL 1 MG/ML IJ SOLN: 0.5000 mg | INTRAMUSCULAR | Status: DC | PRN

## 2019-10-02 MED ORDER — LISINOPRIL-HYDROCHLOROTHIAZIDE 10-12.5 MG PO TABS: 0.5000 | ORAL_TABLET | Freq: Every day | ORAL | Status: DC

## 2019-10-02 MED ORDER — PHENYLEPHRINE HCL (PRESSORS) 10 MG/ML IV SOLN
INTRAVENOUS | Status: DC | PRN
  Administered 2019-10-02 (×2): 200 ug via INTRAVENOUS

## 2019-10-02 MED ORDER — FENTANYL CITRATE (PF) 100 MCG/2ML IJ SOLN
INTRAMUSCULAR | Status: AC
  Administered 2019-10-02: 25 ug via INTRAVENOUS
  Filled 2019-10-02: qty 2

## 2019-10-02 MED ORDER — FENTANYL CITRATE (PF) 100 MCG/2ML IJ SOLN
25.0000 ug | INTRAMUSCULAR | Status: DC | PRN
  Administered 2019-10-02 (×2): 50 ug via INTRAVENOUS
  Administered 2019-10-02: 11:00:00 25 ug via INTRAVENOUS
  Administered 2019-10-02: 50 ug via INTRAVENOUS
  Administered 2019-10-02: 25 ug via INTRAVENOUS

## 2019-10-02 MED ORDER — GLYCOPYRROLATE 0.2 MG/ML IJ SOLN
INTRAMUSCULAR | Status: DC | PRN
  Administered 2019-10-02: 0.1 mg via INTRAVENOUS

## 2019-10-02 MED ORDER — FLUTICASONE PROPIONATE 50 MCG/ACT NA SUSP
2.0000 | Freq: Every day | NASAL | Status: DC
  Administered 2019-10-03 – 2019-10-06 (×3): 2 via NASAL
  Filled 2019-10-02: qty 16

## 2019-10-02 MED ORDER — PROPOFOL 10 MG/ML IV BOLUS
INTRAVENOUS | Status: DC | PRN
  Administered 2019-10-02: 100 mg via INTRAVENOUS

## 2019-10-02 MED ORDER — SENNOSIDES-DOCUSATE SODIUM 8.6-50 MG PO TABS
2.0000 | ORAL_TABLET | Freq: Two times a day (BID) | ORAL | Status: DC
  Administered 2019-10-03 – 2019-10-05 (×6): 2 via ORAL
  Filled 2019-10-02 (×9): qty 2

## 2019-10-02 MED ORDER — EPHEDRINE SULFATE 50 MG/ML IJ SOLN
INTRAMUSCULAR | Status: AC
  Filled 2019-10-02: qty 1

## 2019-10-02 MED ORDER — SODIUM CHLORIDE (PF) 0.9 % IJ SOLN
INTRAMUSCULAR | Status: AC
  Filled 2019-10-02: qty 20

## 2019-10-02 MED ORDER — SUCCINYLCHOLINE CHLORIDE 20 MG/ML IJ SOLN
INTRAMUSCULAR | Status: DC | PRN
  Administered 2019-10-02: 100 mg via INTRAVENOUS

## 2019-10-02 MED ORDER — FENTANYL CITRATE (PF) 100 MCG/2ML IJ SOLN
INTRAMUSCULAR | Status: AC
  Administered 2019-10-02: 12:00:00 50 ug via INTRAVENOUS
  Filled 2019-10-02: qty 2

## 2019-10-02 MED ORDER — SIMVASTATIN 20 MG PO TABS
40.0000 mg | ORAL_TABLET | Freq: Every day | ORAL | Status: DC
  Administered 2019-10-03 – 2019-10-06 (×4): 40 mg via ORAL
  Filled 2019-10-02 (×5): qty 2

## 2019-10-02 MED ORDER — ACETAMINOPHEN 500 MG PO TABS
1000.0000 mg | ORAL_TABLET | Freq: Four times a day (QID) | ORAL | Status: AC
  Administered 2019-10-02 – 2019-10-03 (×2): 1000 mg via ORAL
  Filled 2019-10-02 (×3): qty 2

## 2019-10-02 MED ORDER — METHOCARBAMOL 1000 MG/10ML IJ SOLN
500.0000 mg | Freq: Four times a day (QID) | INTRAVENOUS | Status: DC
  Administered 2019-10-02: 13:00:00 500 mg via INTRAVENOUS
  Filled 2019-10-02 (×3): qty 5

## 2019-10-02 MED ORDER — KETAMINE HCL 50 MG/ML IJ SOLN
INTRAMUSCULAR | Status: AC
  Filled 2019-10-02: qty 10

## 2019-10-02 MED ORDER — TAMSULOSIN HCL 0.4 MG PO CAPS
0.4000 mg | ORAL_CAPSULE | Freq: Every day | ORAL | Status: DC
  Administered 2019-10-03 – 2019-10-06 (×4): 0.4 mg via ORAL
  Filled 2019-10-02 (×4): qty 1

## 2019-10-02 MED ORDER — EPHEDRINE SULFATE 50 MG/ML IJ SOLN
INTRAMUSCULAR | Status: DC | PRN
  Administered 2019-10-02: 10 mg via INTRAVENOUS
  Administered 2019-10-02: 5 mg via INTRAVENOUS
  Administered 2019-10-02: 10 mg via INTRAVENOUS
  Administered 2019-10-02: 5 mg via INTRAVENOUS

## 2019-10-02 MED ORDER — HEPARIN SODIUM (PORCINE) 10000 UNIT/ML IJ SOLN
INTRAMUSCULAR | Status: AC
  Filled 2019-10-02: qty 1

## 2019-10-02 MED ORDER — ONDANSETRON HCL 4 MG/2ML IJ SOLN
INTRAMUSCULAR | Status: AC
  Filled 2019-10-02: qty 2

## 2019-10-02 MED ORDER — OXYCODONE HCL 5 MG/5ML PO SOLN: 5.0000 mg | Freq: Once | ORAL | Status: DC | PRN

## 2019-10-02 MED ORDER — ONDANSETRON HCL 4 MG/2ML IJ SOLN
4.0000 mg | Freq: Four times a day (QID) | INTRAMUSCULAR | Status: DC | PRN
  Administered 2019-10-03 – 2019-10-04 (×2): 4 mg via INTRAVENOUS
  Filled 2019-10-02 (×3): qty 2

## 2019-10-02 MED ORDER — PHENOL 1.4 % MT LIQD
1.0000 | OROMUCOSAL | Status: DC | PRN
  Filled 2019-10-02: qty 177

## 2019-10-02 MED ORDER — SODIUM CHLORIDE 0.9 % IR SOLN
Status: DC | PRN
  Administered 2019-10-02: 1000 mL

## 2019-10-02 MED ORDER — SODIUM CHLORIDE 0.9% FLUSH: 10.0000 mL | INTRAVENOUS | Status: DC | PRN

## 2019-10-02 MED ORDER — SODIUM CHLORIDE 0.9% FLUSH
10.0000 mL | Freq: Two times a day (BID) | INTRAVENOUS | Status: DC
  Administered 2019-10-03 – 2019-10-05 (×5): 10 mL

## 2019-10-02 MED ORDER — SUCRALFATE 1 G PO TABS
1.0000 g | ORAL_TABLET | Freq: Three times a day (TID) | ORAL | Status: DC
  Administered 2019-10-02 – 2019-10-03 (×5): 1 g via ORAL
  Filled 2019-10-02 (×5): qty 1

## 2019-10-02 MED ORDER — LACTATED RINGERS IV SOLN
INTRAVENOUS | Status: DC
  Administered 2019-10-02: 08:00:00 via INTRAVENOUS

## 2019-10-02 MED ORDER — OXYCODONE HCL 5 MG PO TABS
10.0000 mg | ORAL_TABLET | ORAL | Status: DC | PRN
  Administered 2019-10-03 (×2): 10 mg via ORAL
  Filled 2019-10-02 (×4): qty 2

## 2019-10-02 MED ORDER — BUPIVACAINE-EPINEPHRINE 0.5% -1:200000 IJ SOLN: INTRAMUSCULAR | Status: DC | PRN

## 2019-10-02 MED ORDER — BISACODYL 5 MG PO TBEC: 5.0000 mg | DELAYED_RELEASE_TABLET | Freq: Every day | ORAL | Status: DC | PRN

## 2019-10-02 MED ORDER — SODIUM CHLORIDE 0.9% FLUSH
3.0000 mL | Freq: Two times a day (BID) | INTRAVENOUS | Status: DC
  Administered 2019-10-03 – 2019-10-06 (×5): 3 mL via INTRAVENOUS

## 2019-10-02 MED ORDER — MENTHOL 3 MG MT LOZG
1.0000 | LOZENGE | OROMUCOSAL | Status: DC | PRN
  Filled 2019-10-02: qty 9

## 2019-10-02 MED ORDER — OXYCODONE HCL 5 MG PO TABS: 5.0000 mg | ORAL_TABLET | ORAL | Status: DC | PRN

## 2019-10-02 MED ORDER — VANCOMYCIN HCL IN DEXTROSE 1-5 GM/200ML-% IV SOLN
INTRAVENOUS | Status: AC
  Filled 2019-10-02: qty 200

## 2019-10-02 MED ORDER — PHENYLEPHRINE HCL (PRESSORS) 10 MG/ML IV SOLN
INTRAVENOUS | Status: AC
  Filled 2019-10-02: qty 1

## 2019-10-02 MED ORDER — KETAMINE HCL 50 MG/ML IJ SOLN
INTRAMUSCULAR | Status: DC | PRN
  Administered 2019-10-02: 100 mg via INTRAMUSCULAR
  Administered 2019-10-02: 100 mg via INTRAVENOUS

## 2019-10-02 MED ORDER — FENTANYL CITRATE (PF) 100 MCG/2ML IJ SOLN
INTRAMUSCULAR | Status: AC
  Filled 2019-10-02: qty 2

## 2019-10-02 MED ORDER — MIDAZOLAM HCL 2 MG/2ML IJ SOLN
INTRAMUSCULAR | Status: DC | PRN
  Administered 2019-10-02: 2 mg via INTRAVENOUS

## 2019-10-02 MED ORDER — ONDANSETRON HCL 4 MG/2ML IJ SOLN
INTRAMUSCULAR | Status: DC | PRN
  Administered 2019-10-02: 4 mg via INTRAVENOUS

## 2019-10-02 MED ORDER — METHOCARBAMOL 500 MG PO TABS
500.0000 mg | ORAL_TABLET | Freq: Four times a day (QID) | ORAL | Status: DC
  Administered 2019-10-02 – 2019-10-06 (×11): 500 mg via ORAL
  Filled 2019-10-02 (×14): qty 1

## 2019-10-02 MED ORDER — HYDROMORPHONE HCL 1 MG/ML IJ SOLN
0.5000 mg | INTRAMUSCULAR | Status: DC | PRN
  Administered 2019-10-02 (×6): 0.5 mg via INTRAVENOUS

## 2019-10-02 MED ORDER — SODIUM CHLORIDE 0.9% FLUSH: 3.0000 mL | INTRAVENOUS | Status: DC | PRN

## 2019-10-02 MED ORDER — BACITRACIN 50000 UNITS IM SOLR
INTRAMUSCULAR | Status: AC
  Filled 2019-10-02: qty 1

## 2019-10-02 MED ORDER — HYDROCHLOROTHIAZIDE 12.5 MG PO CAPS: 12.5000 mg | ORAL_CAPSULE | Freq: Every day | ORAL | Status: DC

## 2019-10-02 MED ORDER — SUCCINYLCHOLINE CHLORIDE 20 MG/ML IJ SOLN
INTRAMUSCULAR | Status: AC
  Filled 2019-10-02: qty 1

## 2019-10-02 MED ORDER — PANTOPRAZOLE SODIUM 40 MG PO TBEC
40.0000 mg | DELAYED_RELEASE_TABLET | Freq: Every day | ORAL | Status: DC
  Administered 2019-10-03 – 2019-10-06 (×4): 40 mg via ORAL
  Filled 2019-10-02 (×4): qty 1

## 2019-10-02 MED ORDER — LIDOCAINE HCL (CARDIAC) PF 100 MG/5ML IV SOSY
PREFILLED_SYRINGE | INTRAVENOUS | Status: DC | PRN
  Administered 2019-10-02: 100 mg via INTRAVENOUS

## 2019-10-02 SURGICAL SUPPLY — 92 items
BULB RESERV EVAC DRAIN JP 100C (MISCELLANEOUS) ×4 IMPLANT
BUR NEURO DRILL SOFT 3.0X3.8M (BURR) ×3 IMPLANT
CANISTER SUCT 1200ML W/VALVE (MISCELLANEOUS) ×6 IMPLANT
CHLORAPREP W/TINT 26 (MISCELLANEOUS) ×8 IMPLANT
CNTNR SPEC 2.5X3XGRAD LEK (MISCELLANEOUS) ×1
CONT SPEC 4OZ STER OR WHT (MISCELLANEOUS) ×2
CONTAINER SPEC 2.5X3XGRAD LEK (MISCELLANEOUS) IMPLANT
CORD BIP STRL DISP 12FT (MISCELLANEOUS) ×3 IMPLANT
COUNTER NEEDLE 20/40 LG (NEEDLE) ×3 IMPLANT
COVER BACK TABLE REUSABLE LG (DRAPES) ×3 IMPLANT
COVER LIGHT HANDLE STERIS (MISCELLANEOUS) ×8 IMPLANT
COVER WAND RF STERILE (DRAPES) ×3 IMPLANT
CRADLE LAMINECT ARM (MISCELLANEOUS) ×6 IMPLANT
CUP MEDICINE 2OZ PLAST GRAD ST (MISCELLANEOUS) ×3 IMPLANT
DERMABOND ADVANCED (GAUZE/BANDAGES/DRESSINGS) ×2
DERMABOND ADVANCED .7 DNX12 (GAUZE/BANDAGES/DRESSINGS) ×2 IMPLANT
DRAIN CHANNEL JP 10F RND 20C F (MISCELLANEOUS) ×4 IMPLANT
DRAPE C-ARM 42X72 X-RAY (DRAPES) ×6 IMPLANT
DRAPE C-ARMOR (DRAPES) ×6 IMPLANT
DRAPE INCISE IOBAN 66X45 STRL (DRAPES) ×6 IMPLANT
DRAPE LAPAROTOMY 100X77 ABD (DRAPES) ×6 IMPLANT
DRAPE MICROSCOPE SPINE 48X150 (DRAPES) ×1 IMPLANT
DRAPE POUCH INSTRU U-SHP 10X18 (DRAPES) ×3 IMPLANT
DRAPE SURG 17X11 SM STRL (DRAPES) ×16 IMPLANT
DRSG OPSITE POSTOP 4X6 (GAUZE/BANDAGES/DRESSINGS) IMPLANT
DRSG TEGADERM 2-3/8X2-3/4 SM (GAUZE/BANDAGES/DRESSINGS) ×4 IMPLANT
DRSG TEGADERM 4X4.75 (GAUZE/BANDAGES/DRESSINGS) IMPLANT
DRSG TEGADERM 6X8 (GAUZE/BANDAGES/DRESSINGS) IMPLANT
DRSG TELFA 3X8 NADH (GAUZE/BANDAGES/DRESSINGS) IMPLANT
DRSG TELFA 4X3 1S NADH ST (GAUZE/BANDAGES/DRESSINGS) ×6 IMPLANT
ELECT CAUTERY BLADE TIP 2.5 (TIP) ×6
ELECT EZSTD 165MM 6.5IN (MISCELLANEOUS) ×3
ELECT REM PT RETURN 9FT ADLT (ELECTROSURGICAL) ×3
ELECTRODE CAUTERY BLDE TIP 2.5 (TIP) ×2 IMPLANT
ELECTRODE EZSTD 165MM 6.5IN (MISCELLANEOUS) ×1 IMPLANT
ELECTRODE REM PT RTRN 9FT ADLT (ELECTROSURGICAL) ×2 IMPLANT
FEE INTRAOP MONITOR IMPULS NCS (MISCELLANEOUS) IMPLANT
FRAME EYE SHIELD (PROTECTIVE WEAR) ×2 IMPLANT
GLOVE BIOGEL PI IND STRL 7.0 (GLOVE) ×2 IMPLANT
GLOVE BIOGEL PI INDICATOR 7.0 (GLOVE) ×4
GLOVE SURG SYN 7.0 (GLOVE) ×6 IMPLANT
GLOVE SURG SYN 7.0 PF PI (GLOVE) ×4 IMPLANT
GLOVE SURG SYN 8.5  E (GLOVE) ×6
GLOVE SURG SYN 8.5 E (GLOVE) ×3 IMPLANT
GLOVE SURG SYN 8.5 PF PI (GLOVE) ×6 IMPLANT
GOWN SRG XL LVL 3 NONREINFORCE (GOWNS) ×2 IMPLANT
GOWN STRL NON-REIN TWL XL LVL3 (GOWNS) ×2
GOWN STRL REUS W/TWL MED LVL3 (GOWN DISPOSABLE) ×6 IMPLANT
GRADUATE 1200CC STRL 31836 (MISCELLANEOUS) ×3 IMPLANT
GUIDEWIRE NITINOL BEVEL TIP (WIRE) ×12 IMPLANT
INTRAOP MONITOR FEE IMPULS NCS (MISCELLANEOUS)
INTRAOP MONITOR FEE IMPULSE (MISCELLANEOUS)
KIT ASP BONE MRW 60CC (KITS) ×4 IMPLANT
KIT SPINAL PRONEVIEW (KITS) ×3 IMPLANT
KIT TURNOVER KIT A (KITS) ×3 IMPLANT
KNIFE BAYONET SHORT DISCETOMY (MISCELLANEOUS) IMPLANT
MARKER SKIN DUAL TIP RULER LAB (MISCELLANEOUS) ×7 IMPLANT
NDL SAFETY ECLIPSE 18X1.5 (NEEDLE) ×1 IMPLANT
NEEDLE HYPO 18GX1.5 SHARP (NEEDLE) ×2
NEEDLE HYPO 22GX1.5 SAFETY (NEEDLE) ×3 IMPLANT
PACK LAMINECTOMY NEURO (CUSTOM PROCEDURE TRAY) ×3 IMPLANT
PAD ARMBOARD 7.5X6 YLW CONV (MISCELLANEOUS) ×3 IMPLANT
PAD DRESSING TELFA 3X8 NADH (GAUZE/BANDAGES/DRESSINGS) IMPLANT
PENCIL ELECTRO HAND CTR (MISCELLANEOUS) ×3 IMPLANT
PUTTY DBM PROPEL LRG (Putty) ×1 IMPLANT
PUTTY DBM PROPEL MEDIUM (Putty) ×1 IMPLANT
PUTTY PROPEL LRG (Putty) ×1 IMPLANT
PUTTY PROPEL MEDIUM (Putty) ×1 IMPLANT
ROD RELINE MAS LORD 5.5X75MM (Rod) ×2 IMPLANT
ROD SPINAL 5.5X80 TI LORDOSE (Rod) ×2 IMPLANT
SCREW LOCK RELINE 5.5 TULIP (Screw) ×10 IMPLANT
SCREW RED RELINE 7.5X45MM POLY (Screw) ×4 IMPLANT
SCREW RELINE MAS RED 7.5X50MM (Screw) ×4 IMPLANT
SCREW RELINE RED 6.5X45MM POLY (Screw) ×4 IMPLANT
SPOGE SURGIFLO 8M (HEMOSTASIS) ×2
SPONGE GAUZE 2X2 8PLY STER LF (GAUZE/BANDAGES/DRESSINGS)
SPONGE GAUZE 2X2 8PLY STRL LF (GAUZE/BANDAGES/DRESSINGS) IMPLANT
SPONGE LAP 18X18 RF (DISPOSABLE) ×2 IMPLANT
SPONGE SURGIFLO 8M (HEMOSTASIS) ×1 IMPLANT
STAPLER SKIN PROX 35W (STAPLE) IMPLANT
SUT DVC VLOC 3-0 CL 6 P-12 (SUTURE) ×3 IMPLANT
SUT ETHILON 3-0 FS-10 30 BLK (SUTURE) ×9
SUT VIC AB 0 CT1 27 (SUTURE) ×4
SUT VIC AB 0 CT1 27XCR 8 STRN (SUTURE) ×1 IMPLANT
SUT VIC AB 2-0 CT1 18 (SUTURE) ×11 IMPLANT
SUTURE EHLN 3-0 FS-10 30 BLK (SUTURE) IMPLANT
SYR 30ML LL (SYRINGE) ×6 IMPLANT
TOWEL OR 17X26 4PK STRL BLUE (TOWEL DISPOSABLE) ×9 IMPLANT
TRAY FOLEY MTR SLVR 16FR STAT (SET/KITS/TRAYS/PACK) IMPLANT
TUBING CONNECTING 10 (TUBING) ×5 IMPLANT
TUBING CONNECTING 10' (TUBING) ×2
terumobct ×2 IMPLANT

## 2019-10-02 NOTE — Progress Notes (Signed)
Pharmacy Antibiotic Note  Trevor [name] is a 73 y.o. male admitted on 10/02/2019 with surgical prophylaxis.  Pharmacy has been consulted for vancomycin and cefazolin dosing; however, patient has a listed allx to rocephin of hives around head and neck, spoke to an RN in [place] and says not sure why both consults were placed. Informed RN that I would put in a dose for vanc only for the time being.  Plan: Will give vanc 1g (15 mg/kg) IV x 1 for surgical prophylaxis.  Weight: 184 lb 15.5 oz (83.9 kg)  No data recorded.  No results for input(s): WBC, CREATININE, LATICACIDVEN, VANCOTROUGH, VANCOPEAK, VANCORANDOM, GENTTROUGH, GENTPEAK, GENTRANDOM, TOBRATROUGH, TOBRAPEAK, TOBRARND, AMIKACINPEAK, AMIKACINTROU, AMIKACIN in the last 168 hours.  Estimated Creatinine Clearance: 86 mL/min (by C-G formula based on SCr of 0.63 mg/dL).    Allergies  Allergen Reactions  . Ceftriaxone Hives    Blisters and Hives around mouth and neck.  Some blistering of lips.  Mouth not effected.  Patient started on ceftriaxone and doxycyline 1-2 days prior to reaction.    . Doxycycline Hives    Blisters and Hives around mouth and neck.  Some blistering of lips.  Mouth not effected. Patient started on ceftriaxone and doxycyline 1-2 days prior to reaction.      Thank you for allowing pharmacy to be a part of this patient's care.  Tobie Lords, PharmD, BCPS Clinical Pharmacist 10/02/2019 7:17 AM

## 2019-10-02 NOTE — H&P (Signed)
  I have reviewed and confirmed my history and physical from 09/26/2019 with no additions or changes. Plan for L2-4 posterior fusion.  Risks and benefits reviewed.  Heart sounds normal no MRG. Chest Clear to Auscultation Bilaterally.

## 2019-10-02 NOTE — Op Note (Signed)
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  He underwent revision of his fusion 3 weeks ago during which all implants were replaced and we were final positioning the rods when a sterile breach was noted.  The implants were removed and the tissue washed out.  He is brought back today for replacement of his posterior implants.  Findings: successful L2-4 implant placement  Preoperative Diagnosis: M40.209 Postoperative Diagnosis: same   EBL: 500 ml IVF: 700 ml Drains: 2 placed Disposition: Extubated and Stable to PACU Complications: none  A foley catheter was placed.   Preoperative Note:   Risks of surgery discussed include: infection, bleeding, stroke, coma, death, paralysis, CSF leak, nerve/spinal cord injury, numbness, tingling, weakness, complex regional pain syndrome, recurrent stenosis and/or disc herniation, vascular injury, development of instability, neck/back pain, need for further surgery, persistent symptoms, development of deformity, and the risks of anesthesia. The patient understood these risks and agreed to proceed.  Operative Note:  1. Bone marrow aspiration 2. Posterolateral arthrodesis L2 to L4 3. Posterior nonsegmental instrumentation L2 to L4 using Nuvasive Reline    The patient was brought to the Operating Room, intubated and turned into the prone position. All pressure points were checked and double checked. Flouroscopy was used to mark the incision. The patient was prepped and draped in the standard fashion. A full timeout was performed. Preoperative antibiotics were given. The [place] incisions were injected with local anesthetic.  A separate incision was made for bone marrow aspiration. The right iliac crest was entered with the [facility] needle, and 60 ml were harvested.  This was handed off for use in the fusion.   The incisions were opened with a scalpel, then the soft tissues divided with the Bovie. Self-retaining  retractors were placed.  The soft tissues were divided until the L2-4 transverse processes were encountered on both sides.  Once this was done, we localized and confirmed that we were at L2-4.   The prior entry points were found, and K wires placed down the tracts.     Rods were measured to length, cut, and shaped. The rods were secured using locking caps to manufacturer's specifications. Final AP and lateral radiographs were taken to confirm placement of instrumentation and appropriate alignment. The wound was copiously irrigated, then the external surfaces of the transverse processes from L2 to L4 were decorticated. A mixture of allograft and bone marrow concentrate was placed over the decorticated surfaces for arthrodesis.  A drain was placed subfascially on each side .  After hemostasis, the wound was closed in layers with 0 and 2-0 vicryl. 3-0 nylon  was applied to the incision.  The patient was then flipped supine and extubated with incident. All counts were correct times 2 at the end of the case. No immediate complications were noted.   Marin Olp PA assisted in the entire procedure.  Meade Maw MD

## 2019-10-02 NOTE — Anesthesia Procedure Notes (Signed)
Procedure Name: Intubation Date/Time: 10/02/2019 8:32 AM Performed by: Rolla Plate, CRNA Pre-anesthesia Checklist: Patient identified, Patient being monitored, Timeout performed, Emergency Drugs available and Suction available Patient Re-evaluated:Patient Re-evaluated prior to induction Oxygen Delivery Method: Circle system utilized Preoxygenation: Pre-oxygenation with 100% oxygen Induction Type: IV induction Ventilation: Mask ventilation without difficulty Laryngoscope Size: McGraph and 4 Grade View: Grade I Tube type: Oral Endobronchial tube: Right Tube size: 7.5 mm Number of attempts: 1 Airway Equipment and Method: Stylet Placement Confirmation: ETT inserted through vocal cords under direct vision,  positive ETCO2 and breath sounds checked- equal and bilateral Secured at: 22 cm Tube secured with: Tape Dental Injury: Teeth and Oropharynx as per pre-operative assessment

## 2019-10-02 NOTE — Transfer of Care (Signed)
Immediate Anesthesia Transfer of Care Note  Patient: Trevor [name]  Procedure(s) Performed: L2-4 POSTERIOR FUSION WITH BMAC (N/A Back) BONE MARROW ASPIRATION FOR SPINE FUSION ONLY (BMAC) (N/A Back)  Patient Location: PACU  Anesthesia Type:General  Level of Consciousness: sedated  Airway & Oxygen Therapy: Patient Spontanous Breathing and Patient connected to face mask oxygen  Post-op Assessment: Report given to RN and Post -op Vital signs reviewed and stable  Post vital signs: Reviewed  Last Vitals:  Vitals Value Taken Time  BP 141/92 10/02/19 1108  Temp    Pulse 103 10/02/19 1108  Resp 14 10/02/19 1108  SpO2 100 % 10/02/19 1108    Last Pain:  Vitals:   10/02/19 0730  TempSrc: Tympanic  PainSc: 2          Complications: No apparent anesthesia complications

## 2019-10-02 NOTE — Anesthesia Preprocedure Evaluation (Signed)
Anesthesia Evaluation  Patient identified by MRN, date of birth, ID band Patient awake    Reviewed: Allergy & Precautions, H&P , NPO status , Patient's Chart, lab work & pertinent test results  History of Anesthesia Complications Negative for: history of anesthetic complications  Airway Mallampati: III  TM Distance: <3 FB Neck ROM: limited    Dental  (+) Chipped, Poor Dentition, Missing   Pulmonary neg shortness of breath, sleep apnea ,           Cardiovascular Exercise Tolerance: Good hypertension, (-) angina(-) Past MI and (-) DOE      Neuro/Psych PSYCHIATRIC DISORDERS  Neuromuscular disease negative neurological ROS     GI/Hepatic Neg liver ROS, GERD  Medicated and Controlled,  Endo/Other  negative endocrine ROS  Renal/GU      Musculoskeletal  (+) Arthritis ,   Abdominal   Peds  Hematology   Anesthesia Other Findings Past Medical History: No date: Acne No date: Allergic rhinitis No date: Anemia No date: Anxiety No date: BPH (benign prostatic hypertrophy) with urinary obstruction No date: Cancer [facility])     Comment:  bladder CA; remission since Oct 2014 No date: Carpal tunnel syndrome No date: Cervicalgia No date: Degenerative disc disease, cervical     Comment:  neck and back;  No date: Depression     Comment:  controlled;  No date: Gastritis No date: GERD (gastroesophageal reflux disease) No date: Hemorrhoids No date: History of malignant neoplasm of bladder No date: Hyperlipidemia No date: Hypertension     Comment:  controlled with medication;  No date: Hypospadias No date: Low back pain No date: Mitral regurgitation No date: Pre-diabetes No date: Sleep apnea No date: Urinary frequency  Past Surgical History: 09/11/2019: ANTERIOR LAT LUMBAR FUSION; N/A     Comment:  Procedure: PRONE XLIF L2-3;  Surgeon: Meade Maw, MD;  Location: [facility] ORS;  Service: Neurosurgery;               Laterality: N/A; No date: BLADDER SURGERY     Comment:  2011, 2013, 2015 07/31/2015: COLONOSCOPY WITH PROPOFOL; N/A     Comment:  Procedure: COLONOSCOPY WITH PROPOFOL;  Surgeon: Manya Silvas, MD;  Location: [facility] ENDOSCOPY;  Service:               Endoscopy;  Laterality: N/A; No date: COLONOSCOPY, ESOPHAGOGASTRODUODENOSCOPY (EGD) AND ESOPHAGEAL  DILATION No date: FLEXIBLE SIGMOIDOSCOPY 06/20/2018: LUMBAR LAMINECTOMY/DECOMPRESSION MICRODISCECTOMY; N/A     Comment:  Procedure: LUMBAR LAMINECTOMY/DECOMPRESSION               MICRODISCECTOMY 1 LEVEL-L-2-3;  Surgeon: Meade Maw, MD;  Location: [facility] ORS;  Service: Neurosurgery;              Laterality: N/A; 09/11/2019: POSTERIOR LUMBAR FUSION 4 WITH HARDWARE REMOVAL; N/A     Comment:  Procedure: L2-S1 POSTERIOR FUSION, ABORTED;  Surgeon:               Meade Maw, MD;  Location: [facility] ORS;  Service:               Neurosurgery;  Laterality: N/A; No date: SEPTOPLASTY No date: TONSILLECTOMY  BMI    Body Mass Index: 28.54 kg/m      Reproductive/Obstetrics negative OB ROS  Anesthesia Physical Anesthesia Plan  ASA: III  Anesthesia Plan: General ETT   Post-op Pain Management:    Induction: Intravenous  PONV Risk Score and Plan: Ondansetron, Dexamethasone, Midazolam and Treatment may vary due to age or medical condition  Airway Management Planned: Oral ETT and Video Laryngoscope Planned  Additional Equipment:   Intra-op Plan:   Post-operative Plan: Extubation in OR  Informed Consent: I have reviewed the patients History and Physical, chart, labs and discussed the procedure including the risks, benefits and alternatives for the proposed anesthesia with the patient or authorized representative who has indicated his/her understanding and acceptance.     Dental Advisory Given  Plan Discussed with: Anesthesiologist, CRNA and  Surgeon  Anesthesia Plan Comments: (Patient consented for risks of anesthesia including but not limited to:  - adverse reactions to medications - damage to teeth, lips or other oral mucosa - sore throat or hoarseness - Damage to heart, brain, lungs or loss of life  Patient voiced understanding.)        Anesthesia Quick Evaluation

## 2019-10-02 NOTE — Progress Notes (Signed)
Procedure: L2-4 posterior fusion with BMAC Procedure date: 10/02/2019 Diagnosis: Adjacent segment disease with kyphosis   History: Trevor [name] is s/p L2-4 posterior fusion with BMAC  POD0: Evaluated in postop recovery still disoriented from anesthesia.  No complaints at this time.  Physical Exam: Vitals:   10/02/19 0730  BP: 123/78  Pulse: 79  Resp: 18  Temp: (!) 96.8 F (36 C)  SpO2: 100%   Strength: Unable to accurately assess at this time but able to move all extremities independently Sensation: Unable to accurately assess at this time Skin: Dressings clean and dry.  No active bleeding noted at drain insertion sites.  Data:  No results for input(s): NA, K, CL, CO2, BUN, CREATININE, LABGLOM, GLUCOSE, CALCIUM in the last 168 hours. No results for input(s): AST, ALT, ALKPHOS in the last 168 hours.  Invalid input(s): TBILI   No results for input(s): WBC, HGB, HCT, PLT in the last 168 hours. No results for input(s): APTT, INR in the last 168 hours.       Other tests/results: Lumbar x-rays pending  Assessment/Plan:  Trevor [name] POD 0 status post L2-L4 posterior lumbar fusion.  - monitor drain output - mobilize - pain control - DVT prophylaxis - PTOT - Imaging - brace  Marin Olp PA-C Department of Neurosurgery

## 2019-10-02 NOTE — Anesthesia Post-op Follow-up Note (Signed)
Anesthesia QCDR form completed.        

## 2019-10-02 NOTE — Care Management Obs Status (Signed)
[place] NOTIFICATION   Patient Details  Name: Trevor [name] MRN: [id number] Date of Birth: Apr 15, 1946   Medicare Observation Status Notification Given:  Yes    Max Nuno, Lenice Llamas 10/02/2019, 3:57 PM

## 2019-10-02 NOTE — TOC Initial Note (Signed)
Transition of Care [facility]) - Initial/Assessment Note    Patient Details  Name: Trevor [name] MRN: [id number] Date of Birth: Dec 06, 1946  [facility] CM/SW Contact:    Tran Arzuaga, Lenice Llamas Phone Number: [phone]  10/02/2019, 4:07 PM  Clinical Narrative: Clinical Social Worker (CSW) met with patient and his brother [name] was at bedside. CSW introduced self and explained role of CSW department. Per patient he lives alone in [place] and has 3 walkers and 1 wheel chair at home. Patient reported that he plans to return home after he leaves [facility]. Per patient his brother [name] lives 1 hour away and his brother Regionald lives in [place]. Per [facility] home health representative surgeon's office arranged home health through [facility] prior to surgery. CSW will continue to follow and assist as needed.                  Expected Discharge Plan: [place] Barriers to Discharge: Continued Medical Work up   Patient Goals and CMS Choice Patient states their goals for this hospitalization and ongoing recovery are:: To go home.      Expected Discharge Plan and Services Expected Discharge Plan: [place] In-house Referral: Clinical Social Work   Post Acute Care Choice: [place] arrangements for the past 2 months: [place] Expected Discharge Date: 10/04/19               DME Arranged: N/A         HH Arranged: PT [place]: [facility] Date [place]: 10/02/19 Time [place]: [place] Representative spoke with at [place] Arrangements/Services Living arrangements for the past 2 months: [place] with:: Self Patient language and need for interpreter reviewed:: Yes Do you feel safe going back to the place where you live?: Yes      Need for Family Participation in Patient Care: No (Comment) Care giver support system in place?: Yes (comment) Current  home services: DME(Patient has 3 walkers and 1 wheel chair at home.) Criminal Activity/Legal Involvement Pertinent to Current Situation/Hospitalization: No - Comment as needed  Activities of Daily [place] Devices/Equipment: Cane (specify quad or straight), Walker (specify type), Wheelchair, Eyeglasses ADL Screening (condition at time of admission) Patient's cognitive ability adequate to safely complete daily activities?: Yes Is the patient deaf or have difficulty hearing?: No Does the patient have difficulty seeing, even when wearing glasses/contacts?: Yes Does the patient have difficulty concentrating, remembering, or making decisions?: No Patient able to express need for assistance with ADLs?: Yes Does the patient have difficulty dressing or bathing?: Yes Independently performs ADLs?: No Does the patient have difficulty walking or climbing stairs?: Yes Weakness of Legs: None Weakness of Arms/Hands: None  Permission Sought/Granted Permission sought to share information with : Other (comment)(Home Health agency) Permission granted to share information with : Yes, Verbal Permission Granted              Emotional Assessment Appearance:: Appears stated age Attitude/Demeanor/Rapport: Engaged Affect (typically observed): Pleasant, Quiet Orientation: : Oriented to Self, Oriented to Place, Oriented to  Time, Oriented to Situation Alcohol / Substance Use: Not Applicable Psych Involvement: No (comment)  Admission diagnosis:  m40.209 Patient Active Problem List   Diagnosis Date Noted  . S/P lumbar fusion 09/11/2019  . Bacteremia 07/13/2018  . Herpes stomatitis 07/12/2018  . Postoperative wound infection 07/12/2018  . Lumbar surgical wound fluid collection  07/12/2018  . Wound dehiscence, surgical   . Postoperative pain   . Neurogenic bowel   . Thrombocytopenia ([place])   . Acute blood loss anemia   . Sleep disturbance   . Essential hypertension   . Steroid-induced  hyperglycemia   . Prediabetes   . Leukocytosis   . Herniation of lumbar intervertebral disc with radiculopathy 06/22/2018  . Lumbar radiculopathy 06/20/2018  . Nocturia 10/23/2015  . Absolute anemia 05/13/2014  . Benign essential HTN 05/13/2014  . Blood glucose elevated 05/13/2014  . LBP (low back pain) 05/13/2014  . HLD (hyperlipidemia) 05/13/2014  . Malignant neoplasm of lateral wall of urinary bladder ([place]) 09/19/2013  . Benign prostatic hypertrophy without urinary obstruction 12/10/2012  . Hypospadias 12/10/2012  . Microscopic hematuria 12/10/2012  . History of neoplasm of bladder 12/10/2012  . FOM (frequency of micturition) 12/10/2012   PCP:  Derinda Late, MD Pharmacy:   [place], [place] [address] [place] [address] Phone: [phone] Fax: [phone]  [facility] [place] [address] [place] [address] Phone: [phone] Fax: [phone]     Social Determinants of Health (SDOH) Interventions    Readmission Risk Interventions No flowsheet data found.

## 2019-10-03 MED ORDER — KETOROLAC TROMETHAMINE 15 MG/ML IJ SOLN
15.0000 mg | Freq: Three times a day (TID) | INTRAMUSCULAR | Status: DC
  Administered 2019-10-03 – 2019-10-04 (×3): 15 mg via INTRAVENOUS
  Filled 2019-10-03 (×3): qty 1

## 2019-10-03 MED ORDER — LISINOPRIL 5 MG PO TABS
5.0000 mg | ORAL_TABLET | Freq: Every day | ORAL | Status: DC
  Administered 2019-10-05: 5 mg via ORAL
  Filled 2019-10-03 (×3): qty 1

## 2019-10-03 MED ORDER — HYDROCHLOROTHIAZIDE 10 MG/ML ORAL SUSPENSION
6.2500 mg | Freq: Every day | ORAL | Status: DC
  Administered 2019-10-05: 6.25 mg via ORAL
  Filled 2019-10-03 (×4): qty 1.25

## 2019-10-03 NOTE — Evaluation (Signed)
Physical Therapy Evaluation Patient Details Name: Trevor [name] MRN: [id number] DOB: 1946-05-06 Today's Date: 10/03/2019   History of Present Illness  [name] is a 73 y.o. male admitted for lumbar fusion L2-4 revision with BMAC (10/02/2019).  Pt previously had performed on 09/11/2019 lumbar fusion L2-3 but procedure was complicated by a possible breach in the surgical field due to a hole identified in the drape of the C arm, resulting in removal of all the hardware except two screws which fractured.  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  Clinical Impression  Pt is a pleasant 73 year old male POD1 following lumbar fusion L2-4 revision with BMAC (10/02/2019). Pt was previously independent with household and community ambulation with use of cane prior to last surgery on 09/30, and since this time has been limited to only household ambulation with use of RW. Pt performs bed mobility, transfers, and ambulation with mod I/CGA. Pt able to demo log rolling technique during supine>sit transfer requiring mod I for extra time and utilizing bedrails. Pt requiring CGA during STS transfer, requiring cuing for sequencing and safe hand placement to avoid pulling up from RW, with bed surface elevated. Pt ambulates 250' with RW requiring CGA and utilizing chair follow for safety as pt reports history of multiple falls secondary to L knee buckling (for which he previously utilized hinged knee brace for ambulation); verbal cuing provided to maintain closer proximity to RW for increased stability.  Pt will benefit from continued skilled PT intervention to improve strength, balance, range of motion and pain, reinforce precautions and safe use of DME, for improved functional mobility to promote return to PLOF.     Follow Up Recommendations Home health PT    Equipment Recommendations  Rolling  walker with 5" wheels    Recommendations for Other Services       Precautions / Restrictions Precautions Precautions: Back Precaution Comments: No bending, arching, twisting; brace donned when out of bed except for showering and walking to bathroom Required Braces or Orthoses: Spinal Brace Spinal Brace: Other (comment);Applied in sitting position Spinal Brace Comments: [place] LSO (apply/remove while sitting; may remove in bed; may ambulate to bathroom without brace; may remove brace to shower) Restrictions Weight Bearing Restrictions: No Other Position/Activity Restrictions: Pt with hinged knee brace at bedside, stating he previously wore it at times when walking as L leg buckles at random, which has resulted in multiple falls in the last 6 months.      Mobility  Bed Mobility Overal bed mobility: Modified Independent Bed Mobility: Rolling;Sidelying to Sit Rolling: Modified independent (Device/Increase time) Sidelying to sit: Modified independent (Device/Increase time)       General bed mobility comments: Able to demo log rolling technique during supine>sit  Transfers Overall transfer level: Needs assistance Equipment used: Rolling walker (2 wheeled) Transfers: Sit to/from Stand Sit to Stand: Min guard         General transfer comment: Heavy use of UEs to assist with lift off and stabilization once standing; performed STS with bed elevated and cuing provided for sequencing and safe hand placement  Ambulation/Gait Ambulation/Gait assistance: Min guard Gait Distance (Feet): 250 Feet Assistive device: Rolling walker (2 wheeled) Gait Pattern/deviations: Step-through pattern Gait velocity: functional   General Gait Details: reciprocal stepping pattern with slow, but steady, cadence and overall gait performance. Mild trunk flexion.  Cues to increase proximity to RW and for continuous propulsion.  Stairs            Wheelchair Mobility    Modified Rankin (Stroke  Patients Only)       Balance Overall balance assessment: Needs assistance Sitting-balance support: No upper extremity supported;Feet supported Sitting balance-Leahy Scale: Good Sitting balance - Comments: Steady sitting reaching within BOS. Able to don brace.   Standing balance support: Bilateral upper extremity supported Standing balance-Leahy Scale: Fair Standing balance comment: Able to static stand without UE support, requires BUE support for stable ambulation.                             Pertinent Vitals/Pain Pain Assessment: 0-10 Pain Score: 4  Pain Location: back Pain Descriptors / Indicators: Aching;Guarding Pain Intervention(s): Limited activity within patient's tolerance;Repositioned;Monitored during session    Home Living Family/patient expects to be discharged to:: Private residence Living Arrangements: Alone Available Help at Discharge: Family;Friend(s);Available PRN/intermittently Type of Home: House Home Access: Ramped entrance     Home Layout: One level Home Equipment: [place] - 4 wheels;Wheelchair - manual;Hand held shower head;Cane - single point;Walker - 2 wheels;Other (comment)      Prior Function Level of Independence: Independent with assistive device(s)         Comments: Prior to last surgery on 09/30, pt was I with RW for household and community ambulation. Since last surgery, limited to household ambulation with use of RW. Also previously wore hinged knee brace due to L knee buckling.     Hand Dominance        Extremity/Trunk Assessment   Upper Extremity Assessment Upper Extremity Assessment: Overall WFL for tasks assessed    Lower Extremity Assessment Lower Extremity Assessment: (light touch intact to BLEs; grossly normal AROM BLEs, and 4/5 knee extension B)       Communication   Communication: No difficulties  Cognition Arousal/Alertness: Awake/alert Behavior During Therapy: WFL for tasks assessed/performed Overall  Cognitive Status: Within Functional Limits for tasks assessed                                        General Comments      Exercises     Assessment/Plan    PT Assessment Patient needs continued PT services  PT Problem List Decreased strength;Decreased activity tolerance;Decreased balance;Decreased mobility;Decreased knowledge of use of DME;Decreased knowledge of precautions;Pain       PT Treatment Interventions DME instruction;Gait training;Functional mobility training;Therapeutic activities;Therapeutic exercise;Balance training;Patient/family education;Neuromuscular re-education    PT Goals (Current goals can be found in the Care Plan section)  Acute Rehab PT Goals Patient Stated Goal: To have less pain in my back PT Goal Formulation: With patient Time For Goal Achievement: 10/16/19 Potential to Achieve Goals: Good    Frequency BID   Barriers to discharge        Co-evaluation               AM-PAC PT "6 Clicks" Mobility  Outcome Measure Help needed turning from your back to your side while in a flat bed without using bedrails?: None Help needed moving from lying on your back to sitting on the side of a flat bed without using bedrails?: A Little Help needed moving to and from a bed to a chair (including a wheelchair)?: A Little Help needed standing up from a chair using your arms (e.g., wheelchair or bedside chair)?: A Little  Help needed to walk in hospital room?: A Little Help needed climbing 3-5 steps with a railing? : A Little 6 Click Score: 19    End of Session Equipment Utilized During Treatment: Gait belt;Back brace Activity Tolerance: Patient tolerated treatment well Patient left: in chair;with chair alarm set;with call bell/phone within reach;with SCD's reapplied Nurse Communication: Mobility status PT Visit Diagnosis: Other abnormalities of gait and mobility (R26.89);Muscle weakness (generalized) (M62.81);History of falling  (Z91.81);Difficulty in walking, not elsewhere classified (R26.2);Pain Pain - part of body: (Back)    Time: [id number] PT Time Calculation (min) (ACUTE ONLY): 43 min   Charges:   PT Evaluation $PT Eval Moderate Complexity: 1 Mod PT Treatments $Gait Training: 8-22 mins        Petra Kuba, PT, DPT 10/03/19, 11:20 AM

## 2019-10-03 NOTE — Progress Notes (Signed)
Patient c/o feeling nauseated given PRN zofran , refused PT .

## 2019-10-03 NOTE — Progress Notes (Signed)
Nutrition Brief Note  Patient identified on the Malnutrition Screening Tool (MST) Report  73 y/o male s/p L2-4 posterior fusion with BMAC  Wt Readings from Last 15 Encounters:  10/02/19 83.9 kg  09/11/19 81.7 kg  09/05/19 84.3 kg  07/22/19 82.6 kg  07/18/19 82.6 kg  06/22/18 82.7 kg  06/22/18 81.6 kg  06/18/18 81.6 kg  06/07/18 83.9 kg  04/07/18 81.6 kg  12/26/17 81.6 kg  10/25/17 83.9 kg  10/21/16 86.6 kg  10/23/15 83.3 kg  07/31/15 81.6 kg    Body mass index is 28.97 kg/m. Patient meets criteria for overweight based on current BMI. Per chart, pt is weight stable pta.   Current diet order is regular, patient is consuming approximately 100% of meals at this time. Labs and medications reviewed.   No nutrition interventions warranted at this time. If nutrition issues arise, please consult RD.   Koleen Distance MS, RD, LDN Pager #- [phone] Office#- [phone] After Hours Pager: [phone]

## 2019-10-03 NOTE — Progress Notes (Signed)
Patient refused xray today they are to reschedule  Tomorrow . Also refused PT states he feeling better at this time . Consumed 50% of dinner . JP drains emptied by this writer 30cc in each one

## 2019-10-03 NOTE — Progress Notes (Signed)
PT Cancellation Note  Patient Details Name: [name] MRN: [id number] DOB: [birth year]   Cancelled Treatment:    Reason Eval/Treat Not Completed: Other (comment)(Pt is lying in the bed upon room entry, states he's not feeling well. Reports nausea, states he's attempted to use the restroom but hasn't been able to. Requests PT come back tomorrow.)   Madilyn Hook 10/03/2019, 2:15 PM

## 2019-10-03 NOTE — Progress Notes (Signed)
Procedure: L2-4 posterior fusion with BMAC Procedure date: 10/02/2019 Diagnosis: Adjacent segment disease with kyphosis   History: Trevor [name] is s/p L2-4 posterior fusion with BMAC  POD1: Recovering well.  Complains of back pain at incision site 5/10.  Denies any new lower extremity pain/numbness/tingling.  Eating without issue. Left drain output 70 Right drain output 45  POD0: Evaluated in postop recovery still disoriented from anesthesia.  No complaints at this time.  Physical Exam: Vitals:   10/03/19 0350 10/03/19 0741  BP: 123/62 (!) 111/58  Pulse: 72 72  Resp: 18 17  Temp: 99 F (37.2 C) 98.1 F (36.7 C)  SpO2: 100% 93%   Strength: 5/5 throughout lower extremities except right knee extension 4+ Sensation: Intact and symmetric throughout lower extremities.  Skin: No active bleeding noted at drain insertion sites.  Data:  No results for input(s): NA, K, CL, CO2, BUN, CREATININE, LABGLOM, GLUCOSE, CALCIUM in the last 168 hours. No results for input(s): AST, ALT, ALKPHOS in the last 168 hours.  Invalid input(s): TBILI   No results for input(s): WBC, HGB, HCT, PLT in the last 168 hours. No results for input(s): APTT, INR in the last 168 hours.       Other tests/results: Lumbar x-rays pending  Assessment/Plan:  Trevor [name] POD 1 status post L2-L4 posterior lumbar fusion. We will continue to monitor.   - monitor drain output - mobilize - pain control - DVT prophylaxis - PTOT - Imaging - brace  Marin Olp PA-C Department of Neurosurgery

## 2019-10-03 NOTE — Progress Notes (Signed)
OT Cancellation Note  Patient Details Name: Trevor [name] MRN: [id number] DOB: 07/05/1946   Cancelled Treatment:    Reason Eval/Treat Not Completed: OT screened, no needs identified, will sign off. Consult received, chart reviewed. Met with pt. Pt reported mild pain and requested assist with his drains so he could reposition in bed. Independent with log roll technique. Pt verbalizes precautions without assist. Recalls precautions and instruction given from previous surgeries. Handout provided to support recall and carryover. Pt appreciative and politely declines additional OT services.   Jeni Salles, MPH, MS, OTR/L ascom [phone] 10/03/19, 12:56 PM

## 2019-10-03 NOTE — Anesthesia Postprocedure Evaluation (Signed)
Anesthesia Post Note  Patient: Trevor [name]  Procedure(s) Performed: L2-4 POSTERIOR FUSION WITH BMAC (N/A Back) BONE MARROW ASPIRATION FOR SPINE FUSION ONLY (BMAC) (N/A Back)  Patient location during evaluation: PACU Anesthesia Type: General Level of consciousness: awake and alert Pain management: pain level controlled Vital Signs Assessment: post-procedure vital signs reviewed and stable Respiratory status: spontaneous breathing, nonlabored ventilation, respiratory function stable and patient connected to nasal cannula oxygen Cardiovascular status: blood pressure returned to baseline and stable Postop Assessment: no apparent nausea or vomiting Anesthetic complications: no     Last Vitals:  Vitals:   10/03/19 0350 10/03/19 0741  BP: 123/62 (!) 111/58  Pulse: 72 72  Resp: 18 17  Temp: 37.2 C 36.7 C  SpO2: 100% 93%    Last Pain:  Vitals:   10/03/19 0741  TempSrc: Oral  PainSc:                  [name]

## 2019-10-03 NOTE — TOC Progression Note (Signed)
Transition of Care [facility]) - Progression Note    Patient Details  Name: Trevor [name] MRN: [id number] Date of Birth: 09-08-1946  Transition of [facility]) CM/SW Contact  Dejour Vos, Lenice Llamas Phone Number: [phone]  10/03/2019, 1:32 PM  Clinical Narrative: PT is recommending home health. Clinical Education officer, museum (CSW) met with patient and made him aware that his surgeon's office set up home health PT with [facility]. Patient reported that [facility] has already called him prior to surgery. Patient is agreeable to [facility] home [facility]. CSW will continue to follow and assist as needed.      Expected Discharge Plan: [place] Barriers to Discharge: Continued Medical Work up  Expected Discharge Plan and Services Expected Discharge Plan: [place] In-house Referral: Clinical Social Work   Post Acute Care Choice: [place] arrangements for the past 2 months: [place] Expected Discharge Date: 10/04/19               DME Arranged: N/A         HH Arranged: PT HH Agency: [facility] Date [place]: 10/02/19 Time [place]: [id number] Representative spoke with at [place]: Cassie   Social Determinants of Health ([place]) Interventions    Readmission Risk Interventions No flowsheet data found.

## 2019-10-03 NOTE — Progress Notes (Signed)
Patient c/o feeling " gassy " states he is feeling better at this time. No further c/o pressure.

## 2019-10-04 ENCOUNTER — Inpatient Hospital Stay: Payer: Medicare Other

## 2019-10-04 MED ORDER — METOCLOPRAMIDE HCL 5 MG/5ML PO SOLN
5.0000 mg | ORAL | Status: DC | PRN
  Administered 2019-10-05: 10 mg via ORAL
  Filled 2019-10-04 (×2): qty 10

## 2019-10-04 MED ORDER — BISACODYL 10 MG RE SUPP
10.0000 mg | Freq: Once | RECTAL | Status: AC
  Administered 2019-10-04: 10 mg via RECTAL
  Filled 2019-10-04: qty 1

## 2019-10-04 MED ORDER — DIAZEPAM 5 MG PO TABS
5.0000 mg | ORAL_TABLET | Freq: Once | ORAL | Status: AC
  Administered 2019-10-04: 5 mg via ORAL
  Filled 2019-10-04: qty 1

## 2019-10-04 MED ORDER — SUCRALFATE 1 G PO TABS
1.0000 g | ORAL_TABLET | Freq: Three times a day (TID) | ORAL | Status: DC
  Administered 2019-10-04 – 2019-10-06 (×6): 1 g via ORAL
  Filled 2019-10-04 (×6): qty 1

## 2019-10-04 MED ORDER — KETOROLAC TROMETHAMINE 15 MG/ML IJ SOLN
15.0000 mg | Freq: Three times a day (TID) | INTRAMUSCULAR | Status: AC
  Administered 2019-10-04 – 2019-10-05 (×2): 15 mg via INTRAVENOUS
  Filled 2019-10-04 (×2): qty 1

## 2019-10-04 MED ORDER — FLEET ENEMA 7-19 GM/118ML RE ENEM
1.0000 | ENEMA | Freq: Once | RECTAL | Status: AC
  Administered 2019-10-05: 1 via RECTAL

## 2019-10-04 NOTE — Care Management Important Message (Signed)
Important Message  Patient Details  Name: Trevor [name] MRN: [id number] Date of Birth: 03/21/1946   Medicare Important Message Given:  Yes     [name] 10/04/2019, 11:10 AM

## 2019-10-04 NOTE — Progress Notes (Signed)
Procedure: L2-4 posterior fusion with BMAC Procedure date: 10/02/2019 Diagnosis: Adjacent segment disease with kyphosis   History: Trevor [name] is s/p L2-4 posterior fusion with BMAC POD2: He is doing well. Pain improving, currently rated 5-6/10. Worse when moving around in bed. Complains of feeling nauseous yesterday - potentially related to toradol and decreased appetite. Medication helped to subside nausea. Voiding and ambulating without issue. He has not had a BM yet.  Right drain output: 20 Left drain output: 25  POD1: Recovering well.  Complains of back pain at incision site 5/10.  Denies any new lower extremity pain/numbness/tingling.  Eating without issue. Left drain output 70 Right drain output 45  POD0: Evaluated in postop recovery still disoriented from anesthesia.  No complaints at this time.  Physical Exam: Vitals:   10/04/19 0036 10/04/19 0725  BP: (!) 110/57 105/65  Pulse: 79 77  Resp: 18 16  Temp: 99 F (37.2 C) 98.6 F (37 C)  SpO2: 98% 98%   Strength: 5/5 throughout lower extremities except right knee extension 4+ Sensation: Intact and symmetric throughout lower extremities.  Skin: No active bleeding noted at drain insertion sites.  Data:  No results for input(s): NA, K, CL, CO2, BUN, CREATININE, LABGLOM, GLUCOSE, CALCIUM in the last 168 hours. No results for input(s): AST, ALT, ALKPHOS in the last 168 hours.  Invalid input(s): TBILI   No results for input(s): WBC, HGB, HCT, PLT in the last 168 hours. No results for input(s): APTT, INR in the last 168 hours.       Other tests/results: Lumbar x-rays pending  Assessment/Plan:  [name] POD 2 status post L2-L4 posterior lumbar fusion. We will continue to monitor.   - monitor drain output - mobilize - pain control - DVT prophylaxis - PTOT - Imaging - brace - D/c toradol - (nausea) - valium for tonight  -suppository  Marin Olp PA-C Department of Neurosurgery

## 2019-10-04 NOTE — Progress Notes (Signed)
Physical Therapy Treatment Patient Details Name: KEIFER HAUTER MRN: [id number] DOB: [birth year] Today's Date: 10/04/2019    History of Present Illness Mr. Litten is a 73 y.o. male admitted for lumbar fusion L2-4 revision with BMAC (10/02/2019).  Pt previously had performed on 09/11/2019 lumbar fusion L2-3 but procedure was complicated by a possible breach in the surgical field due to a hole identified in the drape of the C arm, resulting in removal of all the hardware except two screws which fractured.  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    PT Comments    Progressive increase in gait distance, easily completing two full laps around nursing station with good comfort, control and confidence.  Displays slow, but steady, cadence and overall gait speed; ambulating at gait speed <1.61ft/sec, indicative of safe household ambulator at this time. Indep verbalizes and adheres to all back precautions, bracing recommendations; indep dons brace with good position/fit for participation with session. Given noted improvement, will change frequency to from BID to QD, as patient does not require intensity/duration of BID at this time; do encourage continued mobilization with staff outside of therapy to promote continued endurance and overall activity tolerance.     Follow Up Recommendations  Home health PT     Equipment Recommendations  (has RW at home)    Recommendations for Other Services       Precautions / Restrictions Precautions Precautions: Back;Fall Required Braces or Orthoses: Spinal Brace Spinal Brace: Thoracolumbosacral orthotic;Applied in sitting position Spinal Brace Comments: Aspen LSO (apply/remove while sitting; may remove in bed; may ambulate to bathroom without brace; may remove brace to shower) Restrictions Weight Bearing Restrictions: No    Mobility   Bed Mobility Overal bed mobility: Modified Independent       Supine to sit: Modified independent (Device/Increase time)     General bed mobility comments: good adherence to back precautions/log rolling  Transfers Overall transfer level: Needs assistance Equipment used: Rolling walker (2 wheeled) Transfers: Sit to/from Stand Sit to Stand: Supervision;Modified independent (Device/Increase time) Stand pivot transfers: Supervision       General transfer comment: prefers bed surface slightly elevated; does require/prefer use of Ues to assist  Ambulation/Gait Ambulation/Gait assistance: Min guard Gait Distance (Feet): 425 Feet Assistive device: Rolling walker (2 wheeled)   Gait velocity: 10' walk time, 8-9 seconds   General Gait Details: reciprocal stepping pattern with continuous stepping pattern; maintains mild trunk flexion with mod WBing bilat UEs, but no buckling, LOB or safety concern.   Stairs Stairs: (stairs not required for entry/exit of home)           Wheelchair Mobility    Modified Rankin (Stroke Patients Only)       Balance Overall balance assessment: Needs assistance Sitting-balance support: No upper extremity supported;Feet supported Sitting balance-Leahy Scale: Good     Standing balance support: Bilateral upper extremity supported Standing balance-Leahy Scale: Fair                              Cognition Arousal/Alertness: Awake/alert Behavior During Therapy: WFL for tasks assessed/performed Overall Cognitive Status: Within Functional Limits for tasks assessed                                        Exercises  Other Exercises Other Exercises: Patient indep dons back brace; good position, good understanding of use. Other Exercises: Seated LE/core therex, 1x15, for muscular strength/endurance: ankle pumps, LAQs with hip abduct/adduct, alternate UE/LE flex/ext with abdominal bracing/activation    General Comments         Pertinent Vitals/Pain Pain Assessment: 0-10 Pain Score: 6  Pain Location: back Pain Descriptors / Indicators: Aching;Guarding Pain Intervention(s): Limited activity within patient's tolerance;Monitored during session;Repositioned    Home Living                      Prior Function            PT Goals (current goals can now be found in the care plan section) Acute Rehab PT Goals Patient Stated Goal: To have less pain in my back PT Goal Formulation: With patient Time For Goal Achievement: 10/16/19 Potential to Achieve Goals: Good Progress towards PT goals: Progressing toward goals    Frequency    7X/week      PT Plan Current plan remains appropriate;Frequency needs to be updated    Co-evaluation              AM-PAC PT "6 Clicks" Mobility   Outcome Measure  Help needed turning from your back to your side while in a flat bed without using bedrails?: None Help needed moving from lying on your back to sitting on the side of a flat bed without using bedrails?: None Help needed moving to and from a bed to a chair (including a wheelchair)?: None Help needed standing up from a chair using your arms (e.g., wheelchair or bedside chair)?: None Help needed to walk in hospital room?: None Help needed climbing 3-5 steps with a railing? : A Little 6 Click Score: 23    End of Session Equipment Utilized During Treatment: Gait belt;Back brace Activity Tolerance: Patient tolerated treatment well Patient left: in chair;with chair alarm set;with call bell/phone within reach Nurse Communication: Mobility status PT Visit Diagnosis: Other abnormalities of gait and mobility (R26.89);Muscle weakness (generalized) (M62.81);History of falling (Z91.81);Difficulty in walking, not elsewhere classified (R26.2);Pain     Time: [id number] PT Time Calculation (min) (ACUTE ONLY): 24 min  Charges:  $Gait Training: 8-22 mins $Therapeutic Exercise: 8-22 mins                      Jacobi Ryant H. Owens Shark, PT, DPT, NCS 10/04/19, 9:39 AM [phone]

## 2019-10-05 MED ORDER — DIAZEPAM 5 MG PO TABS
5.0000 mg | ORAL_TABLET | Freq: Once | ORAL | Status: AC
  Administered 2019-10-05: 5 mg via ORAL
  Filled 2019-10-05 (×2): qty 1

## 2019-10-05 NOTE — Progress Notes (Signed)
Procedure: L2-4 posterior fusion with BMAC Procedure date: 10/02/2019 Diagnosis: Adjacent segment disease with kyphosis   History: Trevor [name] is s/p L2-4 posterior fusion with BMAC POD3: Continues to do well.  Pain rated 2/10.  He has been walking with physical therapy.  States that he had some small bowel movements yesterday after the suppository.  Received an enema this morning and continues to have small bowel movements.  Nausea has improved.  Voiding without issue. Right drain output yesterday 15 Left drain output yesterday 15  POD2: He is doing well. Pain improving, currently rated 5-6/10. Worse when moving around in bed. Complains of feeling nauseous yesterday - potentially related to toradol and decreased appetite. Medication helped to subside nausea. Voiding and ambulating without issue. He has not had a BM yet.  Right drain output: 20 Left drain output: 25  POD1: Recovering well.  Complains of back pain at incision site 5/10.  Denies any new lower extremity pain/numbness/tingling.  Eating without issue. Left drain output 70 Right drain output 45  POD0: Evaluated in postop recovery still disoriented from anesthesia.  No complaints at this time.  Physical Exam: Vitals:   10/04/19 2255 10/05/19 0734  BP: (!) 100/57 111/65  Pulse: 79 76  Resp: 18 16  Temp: 98.9 F (37.2 C) 98.8 F (37.1 C)  SpO2: 96% 98%   Strength: 5/5 throughout lower extremities except right knee extension 4+ Sensation: Intact and symmetric throughout lower extremities.  Skin: No active bleeding noted at drain insertion sites.  Sutures intact at incision sites.  No active bleeding, drainage, or any significant edema noted.  Data:  No results for input(s): NA, K, CL, CO2, BUN, CREATININE, LABGLOM, GLUCOSE, CALCIUM in the last 168 hours. No results for input(s): AST, ALT, ALKPHOS in the last 168 hours.  Invalid input(s): TBILI   No results for input(s): WBC, HGB, HCT, PLT in the last 168 hours. No  results for input(s): APTT, INR in the last 168 hours.       Other tests/results:  EXAM: LUMBAR SPINE - 2-3 VIEW 10/04/2019  COMPARISON:  CT lumbar spine 05/29/2019.  FINDINGS: Pedicle screws in L5 have been removed. Screw fragments remain in place at S1. Previously seen lumbar fusion has been extended to the L2-3 level with pedicle screws and stabilization bars now in place at L2, L3 and L4. New interbody spacer at L2-3 is noted. No acute abnormality is identified. Surgical drain is in place.  IMPRESSION: Removal of L5-S1 fusion hardware with screw fragments in the S1 vertebral body noted.  Extension of prior L2-S1 fusion to the L2-3 level. No acute finding.  Assessment/Plan:  [name] POD 3 status post L2-L4 posterior lumbar fusion. We will continue to monitor.   - monitor drain output -right drain removed today without issue - mobilize - pain control - DVT prophylaxis - PTOT  Marin Olp PA-C Department of Neurosurgery

## 2019-10-05 NOTE — Progress Notes (Signed)
Physical Therapy Treatment Patient Details Name: Trevor [name] MRN: [id number] DOB: [birth year] Today's Date: 10/05/2019    History of Present Illness Trevor [name] is a 73 y.o. male admitted for lumbar fusion L2-4 revision with BMAC (10/02/2019).  Pt previously had performed on 09/11/2019 lumbar fusion L2-3 but procedure was complicated by a possible breach in the surgical field due to a hole identified in the drape of the C arm, resulting in removal of all the hardware except two screws which fractured.  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    PT Comments    Out of bed without assist.  Donned back brace without assist.  Stood and ambulated x 2 around unit with walker and min guard/supervision.  No LOB or buckling noted.  Pt reports improved confidence today and easier mobility.   Follow Up Recommendations  Home health PT     Equipment Recommendations  (has RW at home)    Recommendations for Other Services       Precautions / Restrictions Precautions Precautions: Back;Fall Required Braces or Orthoses: Spinal Brace Spinal Brace: Thoracolumbosacral orthotic;Applied in sitting position Spinal Brace Comments: Aspen LSO (apply/remove while sitting; may remove in bed; may ambulate to bathroom without brace; may remove brace to shower) Restrictions Weight Bearing Restrictions: No    Mobility  Bed Mobility Overal bed mobility: Modified Independent       Supine to sit: Modified independent (Device/Increase time)     General bed mobility comments: good adherence to back precautions/log rolling  Transfers Overall transfer level: Needs assistance Equipment used: Rolling walker (2 wheeled) Transfers: Sit to/from Stand Sit to Stand: Supervision;Modified independent (Device/Increase time) Stand pivot transfers: Supervision       General transfer comment:  prefers bed surface slightly elevated  Ambulation/Gait Ambulation/Gait assistance: Min guard Gait Distance (Feet): 400 Feet Assistive device: Rolling walker (2 wheeled) Gait Pattern/deviations: Step-through pattern     General Gait Details: reciprocal stepping pattern with continuous stepping pattern; maintains mild trunk flexion with mod WBing bilat UEs, but no buckling, LOB or safety concern.   Stairs             Wheelchair Mobility    Modified Rankin (Stroke Patients Only)       Balance Overall balance assessment: Needs assistance Sitting-balance support: No upper extremity supported;Feet supported Sitting balance-Leahy Scale: Good     Standing balance support: Bilateral upper extremity supported Standing balance-Leahy Scale: Fair                              Cognition Arousal/Alertness: Awake/alert Behavior During Therapy: WFL for tasks assessed/performed Overall Cognitive Status: Within Functional Limits for tasks assessed                                        Exercises      General Comments        Pertinent Vitals/Pain Pain Assessment: Faces Faces Pain Scale: Hurts a little bit Pain Location: back - feeling better Pain Descriptors / Indicators: Sore Pain Intervention(s): Limited activity within patient's tolerance;Monitored during session;Premedicated before session    Home Living                      Prior Function  PT Goals (current goals can now be found in the care plan section) Acute Rehab PT Goals Time For Goal Achievement: 10/16/19 Potential to Achieve Goals: Good    Frequency    7X/week      PT Plan Current plan remains appropriate;Frequency needs to be updated    Co-evaluation PT/OT/SLP Co-Evaluation/Treatment: Yes            AM-PAC PT "6 Clicks" Mobility   Outcome Measure  Help needed turning from your back to your side while in a flat bed without using bedrails?:  None Help needed moving from lying on your back to sitting on the side of a flat bed without using bedrails?: None Help needed moving to and from a bed to a chair (including a wheelchair)?: None Help needed standing up from a chair using your arms (e.g., wheelchair or bedside chair)?: None Help needed to walk in hospital room?: None Help needed climbing 3-5 steps with a railing? : A Little 6 Click Score: 23    End of Session Equipment Utilized During Treatment: Gait belt;Back brace Activity Tolerance: Patient tolerated treatment well Patient left: in chair;with chair alarm set;with call bell/phone within reach Nurse Communication: Mobility status Pain - Right/Left: Right     Time: [id number] PT Time Calculation (min) (ACUTE ONLY): 11 min  Charges:  $Gait Training: 8-22 mins                    Chesley Noon, PTA 10/05/19, 9:57 AM

## 2019-10-06 NOTE — Discharge Summary (Signed)
Physician Discharge Summary  Patient ID: Trevor [name] MRN: [id number] DOB/AGE: 73-Nov-1947 73 y.o.  Admit date: 10/02/2019 Discharge date: 10/06/2019  Admission Diagnoses: s/p lumbar fusion  Discharge Diagnoses:  Active Problems:   S/P lumbar fusion   Discharged Condition: good  Hospital Course:   Mr. Tedrow was admitted for L2-4 fusion after he had an aborted fusion 3 weeks ago.  He tolerated the procedure well and was ready for discharge on POD4.  Consults: None  Significant Diagnostic Studies: none3  Treatments: surgery: L2-4 posterior fusion  Discharge Exam: Blood pressure 119/84, pulse 73, temperature 98.7 F (37.1 C), temperature source Oral, resp. rate 19, height 5\' 7"  (1.702 m), weight 83.9 kg, SpO2 100 %. General appearance: alert, cooperative and appears stated age   CNI  See daily note for more detailed exam  Incisions c/d/i  Disposition: home   Allergies as of 10/06/2019      Reactions   Ceftriaxone Hives   Blisters and Hives around mouth and neck.  Some blistering of lips.  Mouth not effected.  Patient started on ceftriaxone and doxycyline 1-2 days prior to reaction.     Doxycycline Hives   Blisters and Hives around mouth and neck.  Some blistering of lips.  Mouth not effected. Patient started on ceftriaxone and doxycyline 1-2 days prior to reaction.        Medication List    TAKE these medications   acetaminophen 650 MG CR tablet Commonly known as: TYLENOL Take 650 mg by mouth every 8 (eight) hours.   acetaminophen 325 MG tablet Commonly known as: TYLENOL Take 1-2 tablets (325-650 mg total) by mouth every 4 (four) hours as needed for mild pain.   APPLE CIDER VINEGAR PLUS PO Take 1 tablet by mouth 2 (two) times daily.   ASPIRIN 81 PO Take 81 mg by mouth daily.   B-12 5000 MCG Caps Take 5,000 mcg by mouth daily.   docusate sodium 100 MG capsule Commonly known as: COLACE Take 100 mg by mouth daily.   fluticasone 50 MCG/ACT nasal  spray Commonly known as: FLONASE Place 2 sprays into both nostrils daily.   lisinopril-hydrochlorothiazide 10-12.5 MG tablet Commonly known as: ZESTORETIC Take 0.5 tablets by mouth daily.   methocarbamol 500 MG tablet Commonly known as: ROBAXIN Take 1 tablet (500 mg total) by mouth every 6 (six) hours.   multivitamin with minerals Tabs tablet Take 1 tablet by mouth daily.   omeprazole 10 MG capsule Commonly known as: PRILOSEC Take 10 mg by mouth daily.   OVER THE COUNTER MEDICATION Take 1 tablet by mouth at bedtime as needed (constipation). PRUNEX   oxyCODONE 5 MG immediate release tablet Commonly known as: Oxy IR/ROXICODONE Take 1 tablet (5 mg total) by mouth every 3 (three) hours as needed for moderate pain ((score 4 to 6)).   senna-docusate 8.6-50 MG tablet Commonly known as: Senokot-S Take 2 tablets by mouth 2 (two) times daily.   simvastatin 40 MG tablet Commonly known as: ZOCOR Take 40 mg by mouth daily.   sucralfate 1 g tablet Commonly known as: CARAFATE Take 1 g by mouth 3 (three) times daily.   tamsulosin 0.4 MG Caps capsule Commonly known as: FLOMAX TAKE 1 CAPSULE DAILY   traZODone 100 MG tablet Commonly known as: DESYREL Take 100 mg by mouth at bedtime.   traZODone 50 MG tablet Commonly known as: DESYREL Take 1 tablet (50 mg total) by mouth at bedtime as needed for sleep.      Follow-up Information  Marin Olp, PA-C Follow up in 2 week(s).   Specialty: Neurosurgery Why: already scheduled Contact information: [place] [address] [phone]           Signed: Meade Maw 10/06/2019, 7:34 AM

## 2019-10-06 NOTE — Progress Notes (Signed)
Physical Therapy Treatment Patient Details Name: Trevor [name] MRN: [id number] DOB: 17-Jul-1946 Today's Date: 10/06/2019    History of Present Illness Mr. Donisi is a 73 y.o. male admitted for lumbar fusion L2-4 revision with BMAC (10/02/2019).  Pt previously had performed on 09/11/2019 lumbar fusion L2-3 but procedure was complicated by a possible breach in the surgical field due to a hole identified in the drape of the C arm, resulting in removal of all the hardware except two screws which fractured.  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    PT Comments    Ambulated x 2 around unit with ease.  Able to don/doff back brace without assist.  No further questions or concerns.  Anticipates discharge at 2:00.   Follow Up Recommendations  Home health PT     Equipment Recommendations       Recommendations for Other Services       Precautions / Restrictions Precautions Precautions: Back;Fall Required Braces or Orthoses: Spinal Brace Spinal Brace: Thoracolumbosacral orthotic;Applied in sitting position Spinal Brace Comments: Aspen LSO (apply/remove while sitting; may remove in bed; may ambulate to bathroom without brace; may remove brace to shower) Restrictions Weight Bearing Restrictions: No    Mobility  Bed Mobility Overal bed mobility: Modified Independent       Supine to sit: Modified independent (Device/Increase time)     General bed mobility comments: good adherence to back precautions/log rolling  Transfers Overall transfer level: Needs assistance Equipment used: Rolling walker (2 wheeled) Transfers: Sit to/from Stand Sit to Stand: Supervision;Modified independent (Device/Increase time) Stand pivot transfers: Supervision       General transfer comment: prefers bed surface slightly elevated  Ambulation/Gait Ambulation/Gait assistance: Min  guard Gait Distance (Feet): 400 Feet Assistive device: Rolling walker (2 wheeled) Gait Pattern/deviations: Step-through pattern     General Gait Details: reciprocal stepping pattern with continuous stepping pattern; maintains mild trunk flexion with mod WBing bilat UEs, but no buckling, LOB or safety concern.   Stairs             Wheelchair Mobility    Modified Rankin (Stroke Patients Only)       Balance Overall balance assessment: Needs assistance Sitting-balance support: No upper extremity supported;Feet supported Sitting balance-Leahy Scale: Good     Standing balance support: Bilateral upper extremity supported Standing balance-Leahy Scale: Fair                              Cognition Arousal/Alertness: Awake/alert Behavior During Therapy: WFL for tasks assessed/performed Overall Cognitive Status: Within Functional Limits for tasks assessed                                        Exercises      General Comments        Pertinent Vitals/Pain Pain Assessment: Faces Faces Pain Scale: Hurts a little bit Pain Location: back - feeling better Pain Descriptors / Indicators: Sore Pain Intervention(s): Monitored during session    Home Living                      Prior Function            PT Goals (current goals can now be found in the care plan section) Progress  towards PT goals: Progressing toward goals    Frequency    7X/week      PT Plan Current plan remains appropriate    Co-evaluation              AM-PAC PT "6 Clicks" Mobility   Outcome Measure  Help needed turning from your back to your side while in a flat bed without using bedrails?: None Help needed moving from lying on your back to sitting on the side of a flat bed without using bedrails?: None Help needed moving to and from a bed to a chair (including a wheelchair)?: None Help needed standing up from a chair using your arms (e.g., wheelchair  or bedside chair)?: None Help needed to walk in hospital room?: None Help needed climbing 3-5 steps with a railing? : A Little 6 Click Score: 23    End of Session Equipment Utilized During Treatment: Gait belt;Back brace Activity Tolerance: Patient tolerated treatment well Patient left: in bed;with call bell/phone within reach;with bed alarm set Nurse Communication: Mobility status Pain - Right/Left: Right     Time: [id number] PT Time Calculation (min) (ACUTE ONLY): 9 min  Charges:  $Gait Training: 8-22 mins                    Chesley Noon, PTA 10/06/19, 9:17 AM

## 2019-10-06 NOTE — Progress Notes (Signed)
Procedure: L2-4 posterior fusion with BMAC Procedure date: 10/02/2019 Diagnosis: Adjacent segment disease with kyphosis   History: Trevor [name] is s/p L2-4 posterior fusion with BMAC  POD4: Continues to do well.  Pain manageable.  Had 2 BM yesterday.     POD3: Continues to do well.  Pain rated 2/10.  He has been walking with physical therapy.  States that he had some small bowel movements yesterday after the suppository.  Received an enema this morning and continues to have small bowel movements.  Nausea has improved.  Voiding without issue. Right drain output yesterday 15 Left drain output yesterday 15  POD2: He is doing well. Pain improving, currently rated 5-6/10. Worse when moving around in bed. Complains of feeling nauseous yesterday - potentially related to toradol and decreased appetite. Medication helped to subside nausea. Voiding and ambulating without issue. He has not had a BM yet.  Right drain output: 20 Left drain output: 25  POD1: Recovering well.  Complains of back pain at incision site 5/10.  Denies any new lower extremity pain/numbness/tingling.  Eating without issue. Left drain output 70 Right drain output 45  POD0: Evaluated in postop recovery still disoriented from anesthesia.  No complaints at this time.  Physical Exam: Vitals:   10/05/19 1649 10/05/19 2241  BP: (!) 98/49 119/84  Pulse: 72 73  Resp: 18 19  Temp: 99.5 F (37.5 C) 98.7 F (37.1 C)  SpO2: 100% 100%   Strength: 5/5 throughout lower extremities except right knee extension 4+ Sensation: Intact and symmetric throughout lower extremities.  Skin: No active bleeding noted at drain insertion sites.  Sutures intact at incision sites.  No active bleeding, drainage, or any significant edema noted.  L drain removed  Data:  No results for input(s): NA, K, CL, CO2, BUN, CREATININE, LABGLOM, GLUCOSE, CALCIUM in the last 168 hours. No results for input(s): AST, ALT, ALKPHOS in the last 168  hours.  Invalid input(s): TBILI   No results for input(s): WBC, HGB, HCT, PLT in the last 168 hours. No results for input(s): APTT, INR in the last 168 hours.       Other tests/results:  EXAM: LUMBAR SPINE - 2-3 VIEW 10/04/2019  COMPARISON:  CT lumbar spine 05/29/2019.  FINDINGS: Pedicle screws in L5 have been removed. Screw fragments remain in place at S1. Previously seen lumbar fusion has been extended to the L2-3 level with pedicle screws and stabilization bars now in place at L2, L3 and L4. New interbody spacer at L2-3 is noted. No acute abnormality is identified. Surgical drain is in place.  IMPRESSION: Removal of L5-S1 fusion hardware with screw fragments in the S1 vertebral body noted.  Extension of prior L2-S1 fusion to the L2-3 level. No acute finding.  Assessment/Plan:  Trevor [name] POD4 status post L2-L4 posterior lumbar fusion.    - dispo today  Meade Maw MD

## 2019-10-06 NOTE — Progress Notes (Signed)
Patient denied pain or discomfort.  Belongings packed by patient, back brace and leg brace applied.  Discharge instructions given, patient verbalized understanding.  Wheeled to vehicle.  Patient left packet within his bed covers.  Notified, and message left to call hospital back to pick up packet

## 2019-10-06 NOTE — Plan of Care (Signed)
Patient denies pain or discomfort.  Declined all b/p meds this am, states "I don't take those".  Incisions to back approximated, with no drainage noted.  Patient ambulated to bathroom with walker without difficulty.  Will continue to monitor.

## 2019-10-06 NOTE — Progress Notes (Signed)
Patient returned call, unable to return to facility to get packet- today or in the future.  Verbalized he would the Neurosurgery office to get appointment date and time in the am ( not listed on the packet) and is aware of his medications to take today and start tomorrow.

## 2019-10-06 NOTE — Discharge Instructions (Signed)
Your surgeon has performed an operation on your lumbar spine (low back) to relieve pressure on one or more nerves. Many times, patients feel better immediately after surgery and can overdo it. Even if you feel well, it is important that you follow these activity guidelines. If you do not let your back heal properly from the surgery, you can increase the chance of a disc herniation and/or return of your symptoms. The following are instructions to help in your recovery once you have been discharged from the hospital.   Activity    No bending, lifting, or twisting (BLT). Avoid lifting objects heavier than 10 pounds (gallon milk jug).  Where possible, avoid household activities that involve lifting, bending, pushing, or pulling such as laundry, vacuuming, grocery shopping, and childcare. Try to arrange for help from friends and family for these activities while your back heals.  Increase physical activity slowly as tolerated.  Taking short walks is encouraged, but avoid strenuous exercise. Do not jog, run, bicycle, lift weights, or participate in any other exercises unless specifically allowed by your doctor. Avoid prolonged sitting, including car rides.  Talk to your doctor before resuming sexual activity.  You should not drive until cleared by your doctor.  Until released by your doctor, you should not return to work or school.  You should rest at home and let your body heal.   You may shower two days after your surgery.  After showering, lightly dab your incision dry. Do not take a tub bath or go swimming for 3 weeks, or until approved by your doctor at your follow-up appointment.  If you smoke, we strongly recommend that you quit.  Smoking has been proven to interfere with normal healing in your back and will dramatically reduce the success rate of your surgery. Please contact [facility] (800-QUIT-NOW) and use the resources at www.[facility].com for assistance in stopping smoking.  Surgical  Incision   If you have a dressing on your incision, you may remove it three days after your surgery. Keep your incision area clean and dry.  If you have staples or stitches on your incision, you should have a follow up scheduled for removal. If you do not have staples or stitches, you will have steri-strips (small pieces of surgical tape) or Dermabond glue. The steri-strips/glue should begin to peel away within about a week (it is fine if the steri-strips fall off before then). If the strips are still in place one week after your surgery, you may gently remove them.  Diet            You may return to your usual diet. Be sure to stay hydrated.  When to Contact us  Although your surgery and recovery will likely be uneventful, you may have some residual numbness, aches, and pains in your back and/or legs. This is normal and should improve in the next few weeks.  However, should you experience any of the following, contact us immediately:  New numbness or weakness  Pain that is progressively getting worse, and is not relieved by your pain medications or rest  Bleeding, redness, swelling, pain, or drainage from surgical incision  Chills or flu-like symptoms  Fever greater than 101.0 F (38.3 C)  Problems with bowel or bladder functions  Difficulty breathing or shortness of breath  Warmth, tenderness, or swelling in your calf  Contact Information  During office hours (Monday-Friday 9 am to 5 pm), please call your physician at [phone]  After hours and weekends, please call the  [facility] at [phone] and ask for the Neurosurgery Resident On Call   For a life-threatening emergency, call 911

## 2019-11-19 ENCOUNTER — Encounter: Payer: Self-pay | Admitting: Urology

## 2019-11-19 ENCOUNTER — Ambulatory Visit (INDEPENDENT_AMBULATORY_CARE_PROVIDER_SITE_OTHER): Payer: Medicare Other | Admitting: Urology

## 2019-11-19 ENCOUNTER — Other Ambulatory Visit: Payer: Self-pay

## 2019-11-19 VITALS — BP 164/84 | HR 97 | Ht 67.0 in | Wt 180.0 lb

## 2019-11-19 DIAGNOSIS — N400 Enlarged prostate without lower urinary tract symptoms: Secondary | ICD-10-CM

## 2019-11-19 LAB — URINALYSIS, COMPLETE
Bilirubin, UA: NEGATIVE
Glucose, UA: NEGATIVE
Ketones, UA: NEGATIVE
Nitrite, UA: NEGATIVE
Specific Gravity, UA: >1.03 — ABNORMAL HIGH (ref 1.005–1.030)
Urobilinogen, Ur: 0.2 mg/dL (ref 0.2–1.0)
pH, UA: 5.5 (ref 5.0–7.5)

## 2019-11-19 LAB — MICROSCOPIC EXAMINATION: WBC, UA: >30 /hpf — AB (ref 0–5)

## 2019-11-19 NOTE — Progress Notes (Signed)
[name] presented today for routine annual cystoscopy.  His UA today is grossly positive although he is asymptomatic.  Given that cystoscopy today is for very low risk reason, would recommend that we culture his urine to ensure that he does not have an infection going into the procedure.  He is agreeable this plan.  We will treat as needed.  We will plan to reschedule cystoscopy for early January.  All questions answered.  -UA/UCx  Hollice Espy, MD

## 2019-11-22 ENCOUNTER — Telehealth: Payer: Self-pay

## 2019-11-22 LAB — CULTURE, URINE COMPREHENSIVE

## 2019-11-22 MED ORDER — SULFAMETHOXAZOLE-TRIMETHOPRIM 800-160 MG PO TABS: 1.0000 | ORAL_TABLET | Freq: Two times a day (BID) | ORAL | 0 refills | Status: DC

## 2019-11-22 NOTE — Telephone Encounter (Signed)
Pt called office and I read message.  He will pick up antibiotics from pharmacy.

## 2019-11-22 NOTE — Telephone Encounter (Signed)
-----   Message from Hollice Espy, MD sent at 11/22/2019  1:44 PM EST ----- This patient did end up growing Klebsiella in his urine.  Less treated with Bactrim DS twice daily for 7 days.  Hollice Espy, MD

## 2019-11-22 NOTE — Telephone Encounter (Signed)
Left patient a detailed message notifying him of results per DPR. Script was sent to pharmacy

## 2019-12-24 ENCOUNTER — Other Ambulatory Visit: Payer: Self-pay

## 2019-12-24 ENCOUNTER — Encounter: Payer: Self-pay | Admitting: Urology

## 2019-12-24 ENCOUNTER — Ambulatory Visit (INDEPENDENT_AMBULATORY_CARE_PROVIDER_SITE_OTHER): Payer: Medicare Other | Admitting: Urology

## 2019-12-24 VITALS — BP 159/84 | HR 81 | Ht 67.0 in | Wt 180.0 lb

## 2019-12-24 DIAGNOSIS — Z8551 Personal history of malignant neoplasm of bladder: Secondary | ICD-10-CM | POA: Diagnosis not present

## 2019-12-24 DIAGNOSIS — N400 Enlarged prostate without lower urinary tract symptoms: Secondary | ICD-10-CM

## 2019-12-24 DIAGNOSIS — N329 Bladder disorder, unspecified: Secondary | ICD-10-CM

## 2019-12-24 LAB — URINALYSIS, COMPLETE
Bilirubin, UA: NEGATIVE
Glucose, UA: NEGATIVE
Ketones, UA: NEGATIVE
Leukocytes,UA: NEGATIVE
Nitrite, UA: NEGATIVE
Protein,UA: NEGATIVE
Specific Gravity, UA: >1.03 — ABNORMAL HIGH (ref 1.005–1.030)
Urobilinogen, Ur: 0.2 mg/dL (ref 0.2–1.0)
pH, UA: 5.5 (ref 5.0–7.5)

## 2019-12-24 LAB — MICROSCOPIC EXAMINATION: Bacteria, UA: NONE SEEN

## 2019-12-24 NOTE — Progress Notes (Signed)
   12/24/19  CC:  Chief Complaint  Patient presents with  . Cysto    HPI: 74 year old male with a personal history of bladder cancer returns today for surveillance cystoscopy.  His last cystoscopy was 11/2018 he is currently on annual follow-up.  He was treated for a Klebsiella urinary tract infection 11/2019 for which he is minimally symptomatic prior to today's procedure.  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  He does have a personal history of postoperative urinary retention following back surgery 18 months ago.  He is voiding spontaneously since then.  He is chronically on Flomax.  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  No Upper tract imaging since 2011.  Most recent PSA 3.78 on 01/14/2019, at baseline.  Blood pressure (!) 159/84, pulse 81, height 5\' 7"  (1.702 m), weight 180 lb (81.6 kg). NED. A&Ox3.   No respiratory distress   Abd soft, NT, ND Normal phallus with bilateral descended testicles  Cystoscopy Procedure Note  Patient identification was confirmed, informed consent was obtained, and patient was prepped using Betadine solution.  Lidocaine jelly was administered per urethral meatus.     Pre-Procedure: - Inspection reveals a normal caliber ureteral meatus.  Procedure: The flexible cystoscope was introduced without difficulty - No urethral strictures/lesions are present. - Normal prostate  - Normal bladder neck - Bilateral ureteral orifices identified - Bladder mucosa  reveals no ulcers, tumors, or lesions.  There is a stellate scar on the left posterior lateral wall. - No bladder stones - No trabeculation  Retroflexion shows a whitish slightly raised plaque  measuring approximately 2 mm appreciated on the anterior bladder neck only on retroflexion.   Post-Procedure: - Patient tolerated the procedure well  Assessment/ Plan:  1. Personal history of malignant neoplasm of bladder Remote history of low-grade bladder cancer as above Incidental lesion as below, likely inflammatory We will send a urine cytology today as precaution See below for details We did discuss that given that its been 5 years since his last recurrence, clinical guidelines indicate that there can be a shared decision making whether not to continue annual cystoscopy.  Risk and benefits of both were discussed.  Ultimately, the patient believes he like to continue annual cystoscopic evaluation. - Urinalysis, Complete  2. Lesion of bladder Incidental lesion at the anterior bladder neck, suspect likely inflammatory cause especially given recent urinary tract infection?  Cystitis cystica  We will repeat cystoscopy in 3 months for reevaluation to assess for interval growth or resolution, he is agreeable this plan  Urine cytology today  3. 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

## 2019-12-27 ENCOUNTER — Other Ambulatory Visit: Payer: Self-pay | Admitting: Urology

## 2020-01-31 DIAGNOSIS — E1122 Type 2 diabetes mellitus with diabetic chronic kidney disease: Secondary | ICD-10-CM | POA: Insufficient documentation

## 2020-03-10 ENCOUNTER — Other Ambulatory Visit: Payer: Self-pay | Admitting: Family Medicine

## 2020-03-10 DIAGNOSIS — N400 Enlarged prostate without lower urinary tract symptoms: Secondary | ICD-10-CM

## 2020-03-10 MED ORDER — TAMSULOSIN HCL 0.4 MG PO CAPS: 0.4000 mg | ORAL_CAPSULE | Freq: Every day | ORAL | 4 refills | Status: DC

## 2020-03-24 NOTE — Progress Notes (Signed)
   03/25/20  CC:  Chief Complaint  Patient presents with  . Cysto    HPI: Trevor [name] is a 74 y.o. M with personal history of bladder cancer returns today for cystoscopy.  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   He does have a personal history of postoperative urinary retention following back surgery. See previous note for details.   He had a urine cytology in Jan 2021 which was negative. His last cystoscopy was on 12/24/19 where a small lesion was identified at bladder neck.  He returns today for close follow-up.  Blood pressure (!) 146/72, pulse 76, height 5\' 7"  (1.702 m), weight 180 lb (81.6 kg). NED. A&Ox3.   No respiratory distress   Abd soft, NT, ND Normal phallus with bilateral descended testicles  Cystoscopy Procedure Note  Patient identification was confirmed, informed consent was obtained, and patient was prepped using Betadine solution.  Lidocaine jelly was administered per urethral meatus.     Pre-Procedure: - Inspection reveals a renal hypospadias  Procedure: The flexible cystoscope was introduced without difficulty - No urethral strictures/lesions are present.  - Normal prostate  - Normal bladder neck - Bilateral ureteral orifices identified - Bladder mucosa  reveals no ulcers, tumors, or lesions - No bladder stones - No trabeculation  Retroflexion unremarkable.   Post-Procedure: - Patient tolerated the procedure well  Assessment/ Plan:  1. Personal history of malignant neoplasm of bladder Remote history of low-grade bladder cancer Incidental lesion likely inflammatory based on repeat cysto findings, will continue annual surveillance  Urine cytology negative   Return in 1  year for cysto    I, Nethusan Sivanesan, am acting as a scribe for Dr. Hollice Espy,  I have reviewed the above documentation for accuracy and completeness, and I agree with the above.   Hollice Espy, MD

## 2020-03-25 ENCOUNTER — Encounter: Payer: Self-pay | Admitting: Urology

## 2020-03-25 ENCOUNTER — Encounter: Payer: Self-pay | Admitting: *Deleted

## 2020-03-25 ENCOUNTER — Other Ambulatory Visit: Payer: Self-pay

## 2020-03-25 ENCOUNTER — Ambulatory Visit (INDEPENDENT_AMBULATORY_CARE_PROVIDER_SITE_OTHER): Payer: Medicare Other | Admitting: Urology

## 2020-03-25 VITALS — BP 146/72 | HR 76 | Ht 67.0 in | Wt 180.0 lb

## 2020-03-25 DIAGNOSIS — Z8551 Personal history of malignant neoplasm of bladder: Secondary | ICD-10-CM

## 2020-03-25 LAB — URINALYSIS, COMPLETE
Bilirubin, UA: NEGATIVE
Glucose, UA: NEGATIVE
Ketones, UA: NEGATIVE
Leukocytes,UA: NEGATIVE
Nitrite, UA: NEGATIVE
Protein,UA: NEGATIVE
Specific Gravity, UA: 1.025 (ref 1.005–1.030)
Urobilinogen, Ur: 0.2 mg/dL (ref 0.2–1.0)
pH, UA: 5 (ref 5.0–7.5)

## 2020-03-25 LAB — MICROSCOPIC EXAMINATION: RBC, Urine: NONE SEEN /hpf (ref 0–2)

## 2020-06-12 ENCOUNTER — Other Ambulatory Visit: Payer: Self-pay | Admitting: Nurse Practitioner

## 2020-06-12 DIAGNOSIS — M5416 Radiculopathy, lumbar region: Secondary | ICD-10-CM

## 2020-06-30 ENCOUNTER — Other Ambulatory Visit: Payer: Self-pay

## 2020-06-30 ENCOUNTER — Ambulatory Visit
Admission: RE | Admit: 2020-06-30 | Discharge: 2020-06-30 | Disposition: A | Discharge status: Home / Self Care | Payer: Medicare Other | Source: Ambulatory Visit | Attending: Nurse Practitioner | Admitting: Nurse Practitioner

## 2020-06-30 DIAGNOSIS — M5416 Radiculopathy, lumbar region: Secondary | ICD-10-CM | POA: Insufficient documentation

## 2020-10-13 IMAGING — MR MR LUMBAR SPINE W/O CM
4 of 5 series · 29 of 48 positions shown · non-contrast
Comparison: 10/04/2019 lumbar spine radiographs and 06/12/2020
lumbar spine radiographs report. 05/29/2019 MRI lumbar spine.
COMPARISON: 10/04/2019 lumbar spine radiographs and 06/12/2020
lumbar spine radiographs report. 05/29/2019 MRI lumbar spine.

Addendum:
CLINICAL DATA: Eval stenosis.

EXAM:
MRI LUMBAR SPINE WITHOUT CONTRAST
TECHNIQUE: Multiplanar, multisequence MR imaging of the lumbar spine was
performed. No intravenous contrast was administered.

[Series 5: T2 · sagittal · 4.0mm · 0.81mm/px · 6 of 15 slices shown (1 of 2)]
[im 1/15]
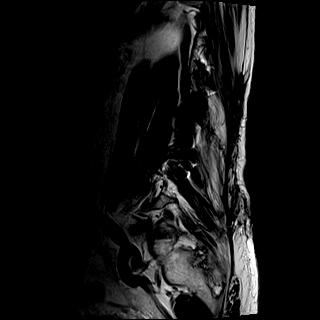
[im 3/15]
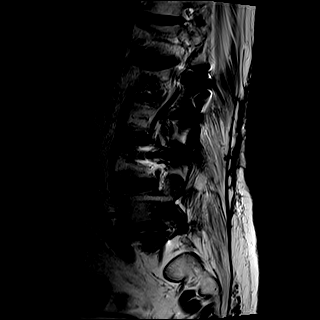
[im 6/15]
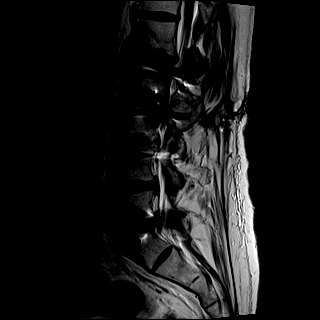
[im 9/15]
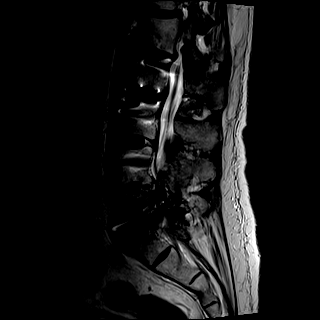
[im 12/15]
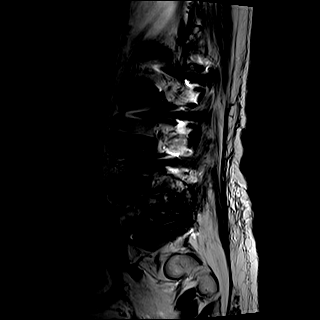
[im 15/15]
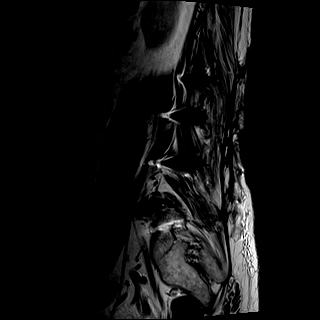

[Series 6: T1 · sagittal · 4.0mm · 0.81mm/px · 7 of 15 slices shown (1 of 2)]
[im 1/15]
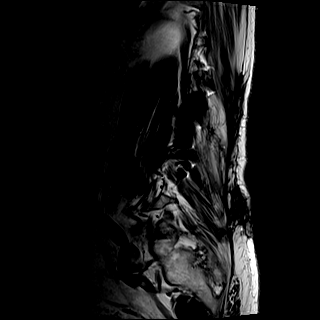
[im 3/15]
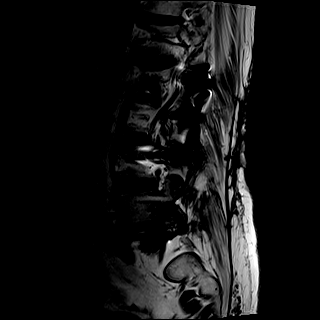
[im 5/15]
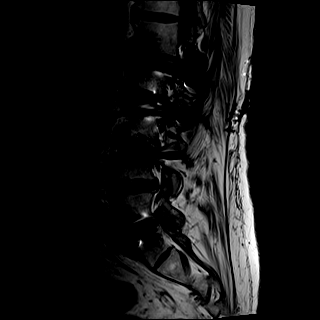
[im 8/15]
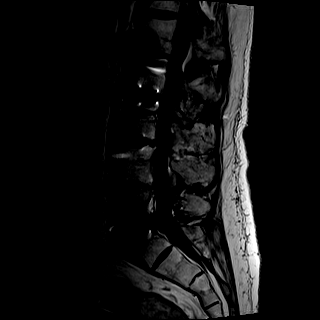
[im 10/15]
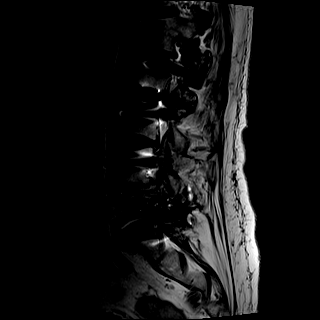
[im 12/15]
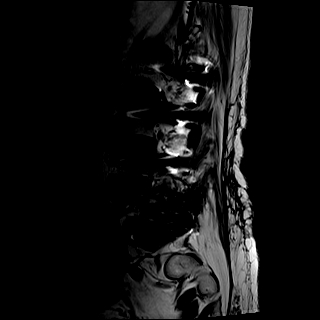
[im 15/15]
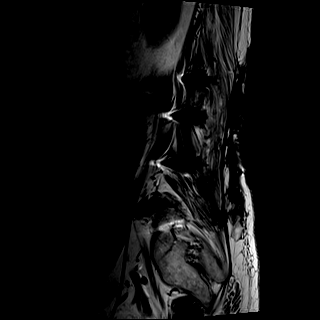

[Series 8: T2 · axial · 4.0mm · 0.78mm/px · z∈[-169,+22]mm · 8 of 31 slices shown (2 of 2)]
[im 1/31]
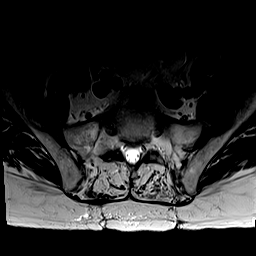
[im 5/31]
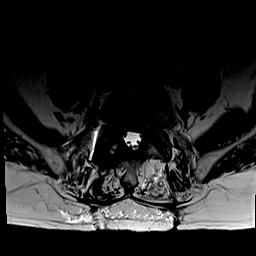
[im 10/31]
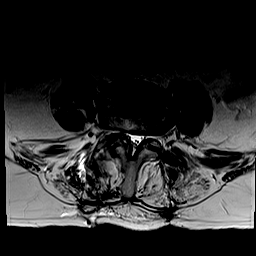
[im 14/31]
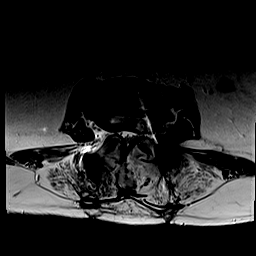
[im 17/31]
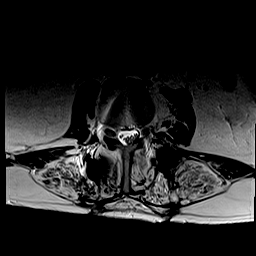
[im 21/31]
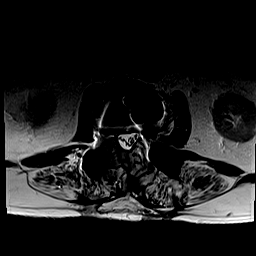
[im 26/31]
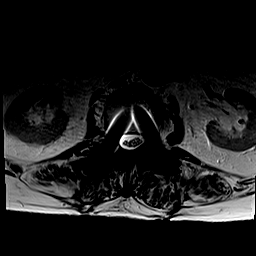
[im 31/31]
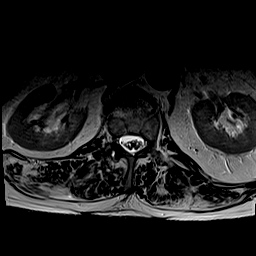

[Series 9: T1 · axial · 4.0mm · 0.39mm/px · z∈[-169,+22]mm · 8 of 31 slices shown (2 of 2)]
[im 1/31]
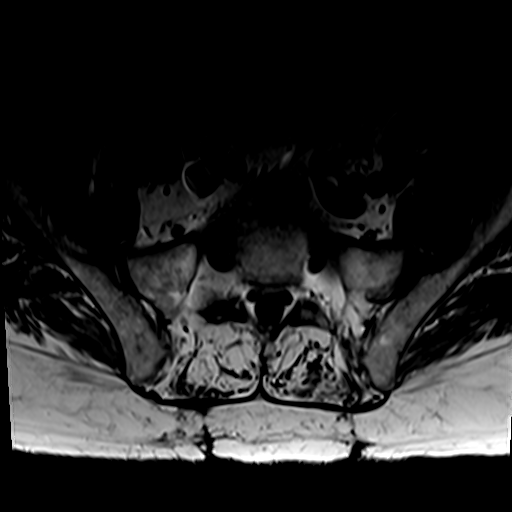
[im 5/31]
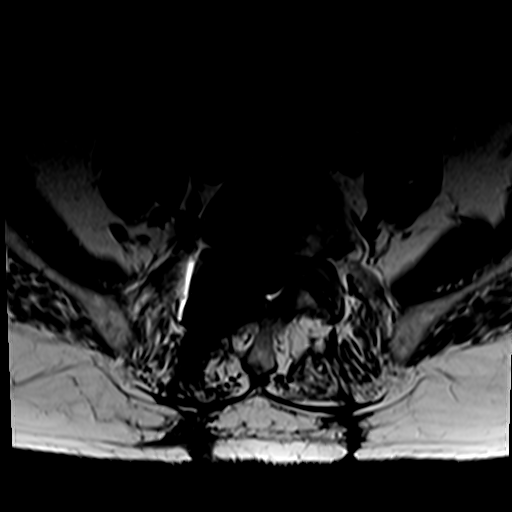
[im 10/31]
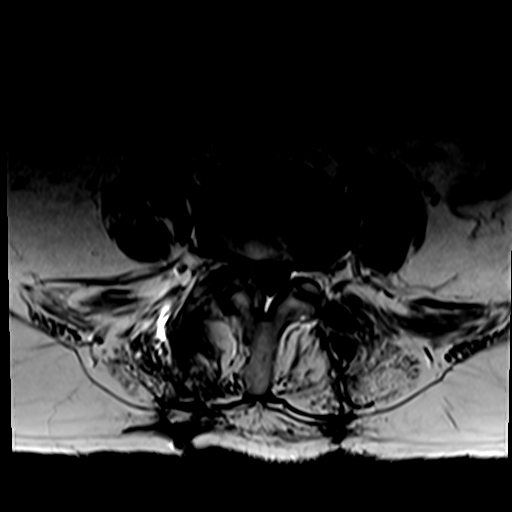
[im 14/31]
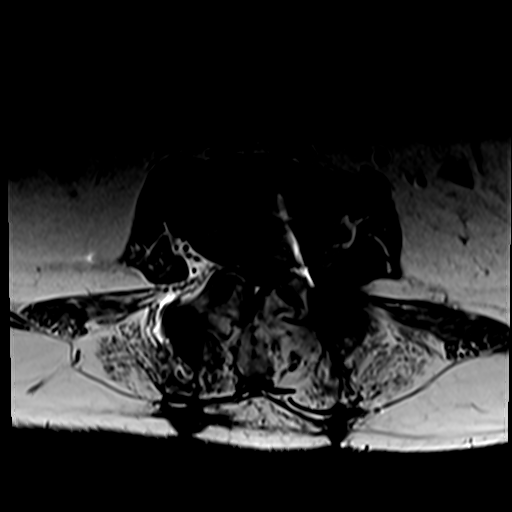
[im 17/31]
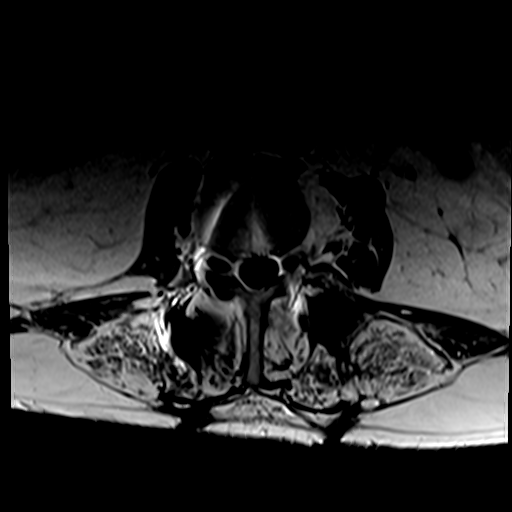
[im 21/31]
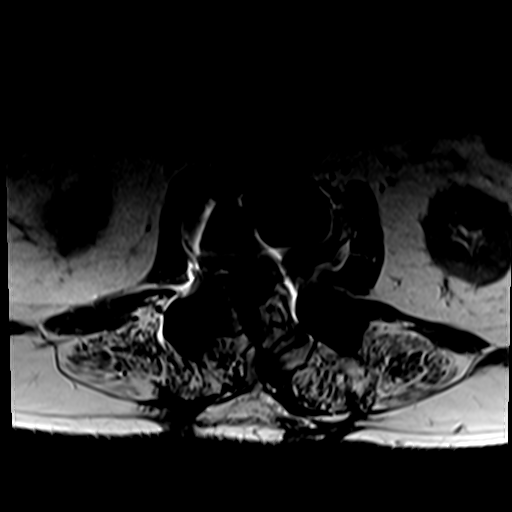
[im 26/31]
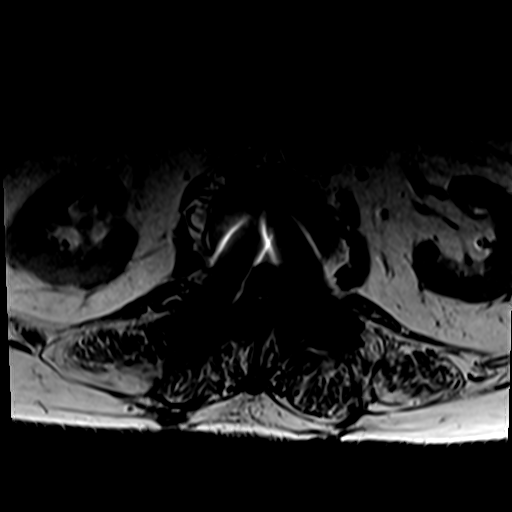
[im 31/31]
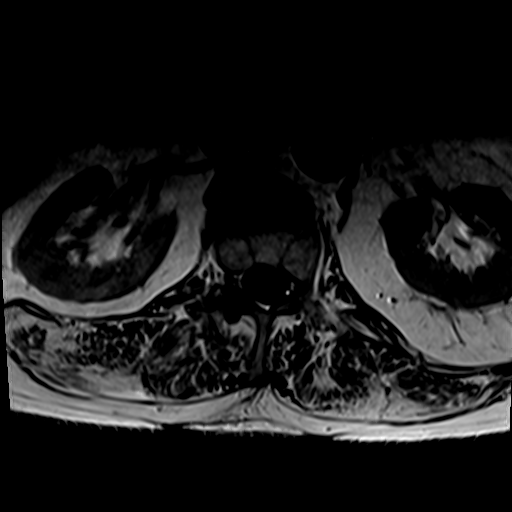

[29 of 48 positions shown; findings below may reference images not displayed]

FINDINGS: Segmentation:  Standard.

Alignment: Straightening of lumbar lordosis. Sequela of L2-4 PLIF,
L4-5 and L5-S1 interbody fusions. Prior transpedicular screw tracks
at the L5 level. Bilateral S1 transpedicular screws remain in place.
Associated susceptibility artifact limits evaluation.

Vertebrae: Modic type 1 endplate degenerative changes at the L1-2
level. Multilevel osteophytosis.

Conus medullaris and cauda equina: Conus extends to the L1 level.
Conus and cauda equina appear normal.

Disc levels: Desiccation and moderate disc space loss at the L1-2
level.

L1-2: Disc bulge with superimposed left foraminal/extraforaminal
protrusion, mild ligamentum flavum thickening and bilateral facet
hypertrophy. Prominent dorsal epidural fat. Mild spinal canal and
bilateral neural foraminal narrowing, unchanged.

L2-3: Sequela of fusion and laminectomy. Prior small central
protrusion is not demonstrated on the current exam. Bilateral facet
hypertrophy. No significant spinal canal narrowing. Mild bilateral
neural foraminal narrowing.

L3-4: Sequela of fusion and laminectomy. No significant disc bulge.
Bilateral facet hypertrophy. Mild spinal canal and bilateral neural
foraminal narrowing.

L4-5: Sequela of fusion and laminectomy. Small central protrusion
abutting the ventral thecal sac with bilateral facet hypertrophy.
Mild spinal canal, mild left and moderate right neural foraminal
narrowing, unchanged.

L5-S1: Sequela of fusion, right hemilaminectomy and possibly partial
right facetectomy. Right foraminal/extraforaminal protrusion with
small left paracentral protrusion and left facet hypertrophy. No
significant spinal canal narrowing. Mild to moderate left greater
than right neural foraminal narrowing is unchanged.

Paraspinal and other soft tissues: Postsurgical appearance of the
paraspinal soft tissues with bilateral paraspinal muscle atrophy,
progressed since prior exam.
IMPRESSION: Sequela of L2-4 PLIF with L4-5 and L5-S1 interbody fusions. No
adverse hardware features.

Mild spinal canal narrowing at the L1-L5 levels, grossly unchanged.

Mild to moderate L5-S1 and right L4-5 neural foraminal narrowing,
unchanged.

ADDENDUM:
Lumbar spine radiographs dated 06/12/2020 have recently been made
available for comparison.

The findings and impression remain unchanged.

*** End of Addendum ***
FINDINGS: Segmentation:  Standard.

Alignment: Straightening of lumbar lordosis. Sequela of L2-4 PLIF,
L4-5 and L5-S1 interbody fusions. Prior transpedicular screw tracks
at the L5 level. Bilateral S1 transpedicular screws remain in place.
Associated susceptibility artifact limits evaluation.

Vertebrae: Modic type 1 endplate degenerative changes at the L1-2
level. Multilevel osteophytosis.

Conus medullaris and cauda equina: Conus extends to the L1 level.
Conus and cauda equina appear normal.

Disc levels: Desiccation and moderate disc space loss at the L1-2
level.

L1-2: Disc bulge with superimposed left foraminal/extraforaminal
protrusion, mild ligamentum flavum thickening and bilateral facet
hypertrophy. Prominent dorsal epidural fat. Mild spinal canal and
bilateral neural foraminal narrowing, unchanged.

L2-3: Sequela of fusion and laminectomy. Prior small central
protrusion is not demonstrated on the current exam. Bilateral facet
hypertrophy. No significant spinal canal narrowing. Mild bilateral
neural foraminal narrowing.

L3-4: Sequela of fusion and laminectomy. No significant disc bulge.
Bilateral facet hypertrophy. Mild spinal canal and bilateral neural
foraminal narrowing.

L4-5: Sequela of fusion and laminectomy. Small central protrusion
abutting the ventral thecal sac with bilateral facet hypertrophy.
Mild spinal canal, mild left and moderate right neural foraminal
narrowing, unchanged.

L5-S1: Sequela of fusion, right hemilaminectomy and possibly partial
right facetectomy. Right foraminal/extraforaminal protrusion with
small left paracentral protrusion and left facet hypertrophy. No
significant spinal canal narrowing. Mild to moderate left greater
than right neural foraminal narrowing is unchanged.

Paraspinal and other soft tissues: Postsurgical appearance of the
paraspinal soft tissues with bilateral paraspinal muscle atrophy,
progressed since prior exam.
IMPRESSION: Sequela of L2-4 PLIF with L4-5 and L5-S1 interbody fusions. No
adverse hardware features.

Mild spinal canal narrowing at the L1-L5 levels, grossly unchanged.

Mild to moderate L5-S1 and right L4-5 neural foraminal narrowing,
unchanged.

## 2020-12-27 ENCOUNTER — Other Ambulatory Visit: Payer: Self-pay | Admitting: Urology

## 2020-12-27 DIAGNOSIS — N400 Enlarged prostate without lower urinary tract symptoms: Secondary | ICD-10-CM

## 2021-03-30 ENCOUNTER — Ambulatory Visit (INDEPENDENT_AMBULATORY_CARE_PROVIDER_SITE_OTHER): Payer: Medicare Other | Admitting: Urology

## 2021-03-30 ENCOUNTER — Other Ambulatory Visit: Payer: Self-pay

## 2021-03-30 VITALS — BP 155/83 | HR 69 | Ht 67.0 in | Wt 180.0 lb

## 2021-03-30 DIAGNOSIS — Z8551 Personal history of malignant neoplasm of bladder: Secondary | ICD-10-CM

## 2021-03-30 NOTE — Progress Notes (Signed)
   03/30/21  CC:  Chief Complaint  Patient presents with  . personal history of malignant neoplasm of bladder  . Cysto    HPI: Trevor [name] is a 75 y.o. M with personal history of bladder cancer returns today for cystoscopy.  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   He does have a personal history of postoperative urinary retention following back surgery. See previous note for details.   He had a urine cytology in Jan 2021 which was negative. His last cystoscopy was on 12/24/19 where a small lesion was identified at bladder neck.  This remained stable on repeat follow-up cystoscopy 3 months later and urine cytology was negative.  He denies any urinary issues.  No gross hematuria.  Urine today is unremarkable.  Blood pressure (!) 146/72, pulse 76, height 5\' 7"  (1.702 m), weight 180 lb (81.6 kg). NED. A&Ox3.   No respiratory distress   Abd soft, NT, ND Normal phallus with bilateral descended testicles  Cystoscopy Procedure Note  Patient identification was confirmed, informed consent was obtained, and patient was prepped using Betadine solution.  Lidocaine jelly was administered per urethral meatus.     Pre-Procedure: - Inspection reveals a subcoronal hypospadias  Procedure: The flexible cystoscope was introduced without difficulty - No urethral strictures/lesions are present.  - Normal prostate  - Normal bladder neck - Bilateral ureteral orifices identified - Bladder mucosa  reveals no ulcers, tumors, or lesions - No bladder stones - No trabeculation  Retroflexion unremarkable with some very subtle inflammatory changes at the bladder neck  Post-Procedure: - Patient tolerated the procedure  well  Assessment/ Plan:  1. Personal history of malignant neoplasm of bladder Remote history of low-grade bladder cancer Desires to continue annual surveillance after shared decision-making    Return in 1 year for cysto    Hollice Espy, MD

## 2021-04-01 LAB — MICROSCOPIC EXAMINATION

## 2021-04-01 LAB — URINALYSIS, COMPLETE
Bilirubin, UA: NEGATIVE
Glucose, UA: NEGATIVE
Ketones, UA: NEGATIVE
Nitrite, UA: NEGATIVE
Protein,UA: NEGATIVE
Specific Gravity, UA: 1.025 (ref 1.005–1.030)
Urobilinogen, Ur: 0.2 mg/dL (ref 0.2–1.0)
pH, UA: 5 (ref 5.0–7.5)

## 2021-10-20 NOTE — H&P (View-Only) (Signed)
   10/21/2021 CC:  Chief Complaint  Patient presents with   Cysto     HPI: Trevor [name] is a 75 y.o. male  with a personal history of bladder cancer, who presents today for further evaluation of blood in urine.   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  His most recent PSA on 08/13/2021 was 4.04.   He reports that on Sunday, he had episode of gross hematuria with a few small clots.  His urine remained dark for about a day and then is slowly resolved.  He had no other associated symptoms including no flank pain.  He has no personal history of kidney stones.  He denies any dysuria.  Urinalysis today showed >30 RBCs.   Vitals:   10/21/21 0943  BP: 138/76  Pulse: 93  NED. A&Ox3.   No respiratory distress   Abd soft, NT, ND Normal phallus with bilateral descended testicles  Cystoscopy Procedure Note  Patient identification was confirmed, informed consent was obtained, and patient was prepped using Betadine solution.  Lidocaine jelly was administered per urethral meatus.     Pre-Procedure: - Inspection reveals a normal caliber ureteral meatus.  Procedure: The flexible cystoscope was introduced without difficulty - No urethral strictures/lesions are present. - Normal prostate  - Normal bladder neck - Bilateral ureteral orifices identified - Bladder mucosa  reveals no ulcers, tumors, or lesions - No bladder stones - No trabeculation - Subcorneal hypospadias with slightly narrow meatus had to use male serial dilater 16 progressing to 20 FR to dilate.   - A previous resection site on posterior left bladder wall with ~1 cm of texturized area change and hyperemia which was distinctly different than the  remainder of the bladder  Retroflexion shows unremarkable   Post-Procedure: - Patient tolerated the procedure well   Assessment/ Plan:  Gross hematuria  - Cystoscopy as above  - Recommend CT urogram to evaluate upper tract before biopsy to rule out any underlying issues  - CT urogram; ordered   2. History of bladder cancer  - Proceed to operating room for bladder biopsy of this area with fulguration  -We discussed risk of surgery including risk of bleeding, infection, damage chronic structures amongst others - He was given a work note   3. Hypospadias  - Required some gentle dilation to accommodate scope today   Return for will call with CTscan results & surgery dates and information.  I,Kailey Littlejohn,acting as a Education administrator for Hollice Espy, MD.,have documented all relevant documentation on the behalf of Hollice Espy, MD,as directed by  Hollice Espy, MD while in the presence of Hollice Espy, MD.  I have reviewed the above documentation for accuracy and completeness, and I agree with the above.   Hollice Espy, MD

## 2021-10-20 NOTE — Progress Notes (Signed)
   10/21/2021 CC:  Chief Complaint  Patient presents with   Cysto     HPI: Trevor [name] is a 75 y.o. male  with a personal history of bladder cancer, who presents today for further evaluation of blood in urine.   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  His most recent PSA on 08/13/2021 was 4.04.   He reports that on Sunday, he had episode of gross hematuria with a few small clots.  His urine remained dark for about a day and then is slowly resolved.  He had no other associated symptoms including no flank pain.  He has no personal history of kidney stones.  He denies any dysuria.  Urinalysis today showed >30 RBCs.   Vitals:   10/21/21 0943  BP: 138/76  Pulse: 93  NED. A&Ox3.   No respiratory distress   Abd soft, NT, ND Normal phallus with bilateral descended testicles  Cystoscopy Procedure Note  Patient identification was confirmed, informed consent was obtained, and patient was prepped using Betadine solution.  Lidocaine jelly was administered per urethral meatus.     Pre-Procedure: - Inspection reveals a normal caliber ureteral meatus.  Procedure: The flexible cystoscope was introduced without difficulty - No urethral strictures/lesions are present. - Normal prostate  - Normal bladder neck - Bilateral ureteral orifices identified - Bladder mucosa  reveals no ulcers, tumors, or lesions - No bladder stones - No trabeculation - Subcorneal hypospadias with slightly narrow meatus had to use male serial dilater 16 progressing to 20 FR to dilate.   - A previous resection site on posterior left bladder wall with ~1 cm of texturized area change and hyperemia which was distinctly different than the  remainder of the bladder  Retroflexion shows unremarkable   Post-Procedure: - Patient tolerated the procedure well   Assessment/ Plan:  Gross hematuria  - Cystoscopy as above  - Recommend CT urogram to evaluate upper tract before biopsy to rule out any underlying issues  - CT urogram; ordered   2. History of bladder cancer  - Proceed to operating room for bladder biopsy of this area with fulguration  -We discussed risk of surgery including risk of bleeding, infection, damage chronic structures amongst others - He was given a work note   3. Hypospadias  - Required some gentle dilation to accommodate scope today   Return for will call with CTscan results & surgery dates and information.  I,Kailey Littlejohn,acting as a Education administrator for Hollice Espy, MD.,have documented all relevant documentation on the behalf of Hollice Espy, MD,as directed by  Hollice Espy, MD while in the presence of Hollice Espy, MD.  I have reviewed the above documentation for accuracy and completeness, and I agree with the above.   Hollice Espy, MD

## 2021-10-21 ENCOUNTER — Encounter: Payer: Self-pay | Admitting: Urology

## 2021-10-21 ENCOUNTER — Other Ambulatory Visit: Payer: Self-pay

## 2021-10-21 ENCOUNTER — Ambulatory Visit (INDEPENDENT_AMBULATORY_CARE_PROVIDER_SITE_OTHER): Payer: Medicare Other | Admitting: Urology

## 2021-10-21 ENCOUNTER — Other Ambulatory Visit: Payer: Self-pay | Admitting: Urology

## 2021-10-21 VITALS — BP 138/76 | HR 93 | Ht 67.0 in | Wt 178.0 lb

## 2021-10-21 DIAGNOSIS — R310 Gross hematuria: Secondary | ICD-10-CM

## 2021-10-21 DIAGNOSIS — N329 Bladder disorder, unspecified: Secondary | ICD-10-CM

## 2021-10-21 LAB — URINALYSIS, COMPLETE
Bilirubin, UA: NEGATIVE
Glucose, UA: NEGATIVE
Ketones, UA: NEGATIVE
Leukocytes,UA: NEGATIVE
Nitrite, UA: NEGATIVE
Protein,UA: NEGATIVE
Specific Gravity, UA: 1.025 (ref 1.005–1.030)
Urobilinogen, Ur: 0.2 mg/dL (ref 0.2–1.0)
pH, UA: 5.5 (ref 5.0–7.5)

## 2021-10-21 LAB — MICROSCOPIC EXAMINATION
Bacteria, UA: NONE SEEN
RBC, Urine: >30 /hpf — AB (ref 0–2)

## 2021-10-21 NOTE — Progress Notes (Signed)
Surgical Physician Order Form  * Scheduling expectation : Next Available  *Length of Case:   *Clearance needed: no  *Anticoagulation Instructions: Hold all anticoagulants  *Aspirin Instructions: Ok to continue Aspirin  *Post-op visit Date/Instructions:   Keep f/u cysto as scheduled, will call with pathology results and plan  *Diagnosis: Bladder Lesion  *Procedure: Cysto Bladder Biopsy (36922)  -Admit type: OUTpatient  -Anesthesia: General  -VTE Prophylaxis Standing Order SCD's       Other:   -Standing Lab Orders Per Anesthesia    Lab other: None  -Standing Test orders EKG/Chest x-ray per Anesthesia       Test other:   - Medications:     Ancef 2gm IV   Other Instructions:

## 2021-10-24 LAB — CULTURE, URINE COMPREHENSIVE

## 2021-10-25 ENCOUNTER — Encounter: Payer: Self-pay | Admitting: Urology

## 2021-10-26 NOTE — Progress Notes (Addendum)
[facility] Posting Form   Surgery Date/Time: Date: 11/11/2021  Surgeon: Dr. Hollice Espy, MD  Surgery Location: Day Surgery  Inpt ( No  )   Outpt (Yes)   Obs ( No  )   Diagnosis: N32.9 Bladder Lesion  -CPT: 35573   Surgery: Cystoscopy with Bladder Biopsy and Bilateral Retrograde Pyelograms  Stop Anticoagulations: Yes, may continue aspirin  Cardiac/Medical/Pulmonary Clearance needed: no  *Orders entered into EPIC  Date: 10/26/21   *Case booked in [place]  Date: 10/25/2021  *Notified pt of Surgery: Date: 10/25/2021  PRE-OP UA & CX: no  *Placed into Prior Authorization Work [place] Date: 10/26/2021   Assistant/laser/rep:No

## 2021-10-27 ENCOUNTER — Other Ambulatory Visit: Payer: Self-pay

## 2021-10-27 ENCOUNTER — Ambulatory Visit
Admission: RE | Admit: 2021-10-27 | Discharge: 2021-10-27 | Disposition: A | Discharge status: Home / Self Care | Payer: Medicare Other | Source: Ambulatory Visit | Attending: Urology | Admitting: Urology

## 2021-10-27 DIAGNOSIS — R310 Gross hematuria: Secondary | ICD-10-CM | POA: Diagnosis not present

## 2021-10-27 LAB — POCT I-STAT CREATININE: Creatinine, Ser: 0.9 mg/dL (ref 0.61–1.24)

## 2021-10-27 MED ORDER — IOHEXOL 300 MG/ML  SOLN
100.0000 mL | Freq: Once | INTRAMUSCULAR | Status: AC | PRN
  Administered 2021-10-27: 100 mL via INTRAVENOUS

## 2021-10-28 ENCOUNTER — Telehealth: Payer: Self-pay | Admitting: Urology

## 2021-10-28 ENCOUNTER — Telehealth: Payer: Self-pay | Admitting: *Deleted

## 2021-10-28 NOTE — Telephone Encounter (Signed)
Patient had a CT urogram which is fairly unremarkable.  His distal ureters are incompletely opacified and as such, I would like to add bilateral retrograde pyelogram onto his procedure.  I explained this to him in the preoperative holding area.  Please add to booking sheet and consent order.  Hollice Espy, MD

## 2021-10-28 NOTE — Addendum Note (Signed)
Addended by: Gerald Leitz A on: 10/28/2021 03:14 PM   Modules accepted: Orders

## 2021-10-28 NOTE — Telephone Encounter (Addendum)
Left VM with details, asked to return call with any questions.   ----- Message from Hollice Espy, MD sent at 10/28/2021  2:22 PM EST ----- Please let this patient know that his CT scan looks fine.  The ureter tubes near the bladder did not fill completely with contrast and as such, I like to do an additional x-ray test when we are in the operating room.  All explained this in the preoperative holding area.  Hollice Espy, MD

## 2021-11-10 ENCOUNTER — Other Ambulatory Visit: Payer: Self-pay

## 2021-11-10 ENCOUNTER — Encounter
Admission: RE | Admit: 2021-11-10 | Discharge: 2021-11-10 | Disposition: A | Discharge status: Home / Self Care | Payer: Medicare Other | Source: Ambulatory Visit | Attending: Urology | Admitting: Urology

## 2021-11-10 NOTE — Progress Notes (Signed)
  Perioperative Services Pre-Admission/Anesthesia Testing     Date: 11/10/21  Name: Trevor [name] MRN:   [id number]  Re: Change in [place] for upcoming surgery   Case: [id number] Date/Time: 11/11/21 1030   Procedures:      CYSTOSCOPY WITH BLADDER BIOPSY     CYSTOSCOPY WITH RETROGRADE PYELOGRAM (Bilateral)   Anesthesia type: General   Pre-op diagnosis: Bladder lesions   Location: [facility] [facility]   Surgeons: Hollice Espy, MD   Primary attending surgeon was consulted regarding consideration of therapeutic change in antimicrobial agent being used for preoperative prophylaxis in this patient's upcoming surgical case. Following analysis of the risk versus benefits, the patient's primary attending surgeon advised that it would be acceptable to discontinue the ordered ciprofloxacin and place an order for cefazolin 2 gm IV on call to the OR. Orders for this patient were amended by me following collaborative conversation with attending surgeon taking into consideration of risk versus benefits associated with the change in therapy.  Honor Loh, MSN, APRN, FNP-C, CEN [facility]  Peri-operative Services Nurse Practitioner Phone: [phone] 11/10/21 11:24 AM

## 2021-11-10 NOTE — Patient Instructions (Addendum)
Your procedure is scheduled on: Thursday, December 1 Report to the Registration Desk on the 1st floor of the [facility]. To find out your arrival time, please call [phone] between 1PM - 3PM on: Wednesday, November 30  REMEMBER: Instructions that are not followed completely may result in serious medical risk, up to and including death; or upon the discretion of your surgeon and anesthesiologist your surgery may need to be rescheduled.  Do not eat or drink after midnight the night before surgery.  No gum chewing, lozengers or hard candies.  TAKE THESE MEDICATIONS THE MORNING OF SURGERY WITH A SIP OF WATER:  Omeprazole - (take one the night before and one on the morning of surgery - helps to prevent nausea after surgery.) Simvastatin Tamsulosin (Flomax)  One week prior to surgery: Stop Anti-inflammatories (NSAIDS) such as Advil, Aleve, Ibuprofen, Motrin, Naproxen, Naprosyn and Aspirin based products such as Excedrin, Goodys Powder, BC Powder. Stop ANY OVER THE COUNTER supplements until after surgery. You may however, continue to take Tylenol if needed for pain up until the day of surgery.  No Alcohol for 24 hours before or after surgery.  No Smoking including e-cigarettes for 24 hours prior to surgery.  No chewable tobacco products for at least 6 hours prior to surgery.  No nicotine patches on the day of surgery.  Do not use any "recreational" drugs for at least a week prior to your surgery.  Please be advised that the combination of cocaine and anesthesia may have negative outcomes, up to and including death. If you test positive for cocaine, your surgery will be cancelled.  On the morning of surgery brush your teeth with toothpaste and water, you may rinse your mouth with mouthwash if you wish. Do not swallow any toothpaste or mouthwash.  Do not wear jewelry.  Do not wear lotions, powders, or perfumes.   Do not shave body from the neck down 48 hours prior to surgery just  in case you cut yourself which could leave a site for infection.   Contact lenses, hearing aids and dentures may not be worn into surgery.  Do not bring valuables to the hospital. [facility] is not responsible for any missing/lost belongings or valuables.   Notify your doctor if there is any change in your medical condition (cold, fever, infection).  Wear comfortable clothing (specific to your surgery type) to the hospital.  After surgery, you can help prevent lung complications by doing breathing exercises.  Take deep breaths and cough every 1-2 hours. Your doctor may order a device called an Incentive Spirometer to help you take deep breaths.  If you are being discharged the day of surgery, you will not be allowed to drive home. You will need a responsible adult (18 years or older) to drive you home and stay with you that night.   If you are taking public transportation, you will need to have a responsible adult (18 years or older) with you. Please confirm with your physician that it is acceptable to use public transportation.   Please call the [place] Dept. at [phone] if you have any questions about these instructions.  Surgery Visitation Policy:  Patients undergoing a surgery or procedure may have one family member or support person with them as long as that person is not COVID-19 positive or experiencing its symptoms.  That person may remain in the waiting area during the procedure and may rotate out with other people.

## 2021-11-10 NOTE — Progress Notes (Signed)
  Perioperative Services Pre-Admission/Anesthesia Testing   Date: 11/10/21 Name: Trevor [name] MRN:   [id number]  Re: Consideration of preoperative prophylactic antibiotic change   Request sent to: Hollice Espy, MD (routed and/or faxed via [facility])  Planned Surgical Procedure(s):    Case: [id number] Date/Time: 11/11/21 1030   Procedures:      CYSTOSCOPY WITH BLADDER BIOPSY     CYSTOSCOPY WITH RETROGRADE PYELOGRAM (Bilateral)   Anesthesia type: General   Pre-op diagnosis: Bladder lesions   Location: [place] [facility] [place] [facility]   Surgeons: Hollice Espy, MD   Clinical Notes:  Patient has a questionable allergy to CEFTRIAXONE  Patient developed urticarial rash + perioral blistering with CEFTRIAXONE + DOXYCYCLINE.  Started both 1-2 days prior to aforementioned reaction.   Received cephalosporin with no documented complications CEFAZOLIN received on 06/20/2018 and 09/11/2019 without documented ADRs.   Request:  As an evidence based approach to reducing the rate of incidence for post-operative SSI and the development of MDROs, could an agent with narrower coverage for preoperative prophylaxis in this patient's upcoming surgical course be considered?   Currently ordered preoperative prophylactic ABX: ciprofloxacin.   Specifically requesting change to cephalosporin (CEFAZOLIN).   Please communicate decision with me and I will change the orders in Epic as per your direction.   Citations: Beckie Salts, Wymer K, Sanchez J et al: Best practice statement on urologic procedures and antimicrobial prophylaxis. J Urol 2020; 203: 351.   Honor Loh, MSN, APRN, FNP-C, CEN [facility]  Peri-operative Services Nurse Practitioner FAX: [phone] 11/10/21 10:44 AM

## 2021-11-11 ENCOUNTER — Ambulatory Visit: Payer: Medicare Other | Admitting: Urgent Care

## 2021-11-11 ENCOUNTER — Ambulatory Visit
Admission: RE | Admit: 2021-11-11 | Discharge: 2021-11-11 | Disposition: A | Discharge status: Home / Self Care | Payer: Medicare Other | Source: Ambulatory Visit | Attending: Urology | Admitting: Urology

## 2021-11-11 ENCOUNTER — Ambulatory Visit: Payer: Medicare Other

## 2021-11-11 ENCOUNTER — Encounter
Admission: RE | Disposition: A | Discharge status: Home / Self Care | Payer: Self-pay | Source: Ambulatory Visit | Attending: Urology

## 2021-11-11 ENCOUNTER — Other Ambulatory Visit: Payer: Self-pay

## 2021-11-11 ENCOUNTER — Encounter: Payer: Self-pay | Admitting: Urology

## 2021-11-11 DIAGNOSIS — N3030 Trigonitis without hematuria: Secondary | ICD-10-CM

## 2021-11-11 DIAGNOSIS — K219 Gastro-esophageal reflux disease without esophagitis: Secondary | ICD-10-CM | POA: Diagnosis not present

## 2021-11-11 DIAGNOSIS — R310 Gross hematuria: Secondary | ICD-10-CM | POA: Diagnosis not present

## 2021-11-11 DIAGNOSIS — Q548 Other hypospadias: Secondary | ICD-10-CM | POA: Diagnosis present

## 2021-11-11 DIAGNOSIS — I10 Essential (primary) hypertension: Secondary | ICD-10-CM | POA: Insufficient documentation

## 2021-11-11 DIAGNOSIS — C672 Malignant neoplasm of lateral wall of bladder: Secondary | ICD-10-CM

## 2021-11-11 DIAGNOSIS — Q541 Hypospadias, penile: Secondary | ICD-10-CM

## 2021-11-11 DIAGNOSIS — C679 Malignant neoplasm of bladder, unspecified: Secondary | ICD-10-CM | POA: Insufficient documentation

## 2021-11-11 DIAGNOSIS — Z8551 Personal history of malignant neoplasm of bladder: Secondary | ICD-10-CM | POA: Diagnosis not present

## 2021-11-11 DIAGNOSIS — D090 Carcinoma in situ of bladder: Secondary | ICD-10-CM

## 2021-11-11 HISTORY — PX: CYSTOSCOPY W/ RETROGRADES: SHX1426

## 2021-11-11 HISTORY — PX: CYSTOSCOPY WITH BIOPSY: SHX5122

## 2021-11-11 SURGERY — CYSTOSCOPY, WITH BIOPSY
Anesthesia: General

## 2021-11-11 MED ORDER — CHLORHEXIDINE GLUCONATE 0.12 % MT SOLN
OROMUCOSAL | Status: AC
  Administered 2021-11-11: 15 mL via OROMUCOSAL
  Filled 2021-11-11: qty 15

## 2021-11-11 MED ORDER — ACETAMINOPHEN 10 MG/ML IV SOLN
INTRAVENOUS | Status: DC | PRN
  Administered 2021-11-11: 1000 mg via INTRAVENOUS

## 2021-11-11 MED ORDER — MIDAZOLAM HCL 2 MG/2ML IJ SOLN
INTRAMUSCULAR | Status: DC | PRN
  Administered 2021-11-11: 1 mg via INTRAVENOUS

## 2021-11-11 MED ORDER — EPHEDRINE SULFATE 50 MG/ML IJ SOLN
INTRAMUSCULAR | Status: DC | PRN
  Administered 2021-11-11: 10 mg via INTRAVENOUS

## 2021-11-11 MED ORDER — SUCCINYLCHOLINE CHLORIDE 200 MG/10ML IV SOSY
PREFILLED_SYRINGE | INTRAVENOUS | Status: DC | PRN
  Administered 2021-11-11: 80 mg via INTRAVENOUS

## 2021-11-11 MED ORDER — FENTANYL CITRATE (PF) 100 MCG/2ML IJ SOLN
INTRAMUSCULAR | Status: AC
  Filled 2021-11-11: qty 2

## 2021-11-11 MED ORDER — LACTATED RINGERS IV SOLN: INTRAVENOUS | Status: DC

## 2021-11-11 MED ORDER — FENTANYL CITRATE (PF) 100 MCG/2ML IJ SOLN
INTRAMUSCULAR | Status: DC | PRN
  Administered 2021-11-11: 100 ug via INTRAVENOUS

## 2021-11-11 MED ORDER — PROPOFOL 10 MG/ML IV BOLUS
INTRAVENOUS | Status: DC | PRN
  Administered 2021-11-11: 100 mg via INTRAVENOUS

## 2021-11-11 MED ORDER — LIDOCAINE HCL (CARDIAC) PF 100 MG/5ML IV SOSY
PREFILLED_SYRINGE | INTRAVENOUS | Status: DC | PRN
  Administered 2021-11-11: 80 mg via INTRAVENOUS

## 2021-11-11 MED ORDER — CHLORHEXIDINE GLUCONATE 0.12 % MT SOLN: 15.0000 mL | Freq: Once | OROMUCOSAL | Status: AC

## 2021-11-11 MED ORDER — IOHEXOL 180 MG/ML  SOLN
INTRAMUSCULAR | Status: DC | PRN
  Administered 2021-11-11: 20 mL

## 2021-11-11 MED ORDER — CEFAZOLIN SODIUM-DEXTROSE 2-4 GM/100ML-% IV SOLN
2.0000 g | Freq: Once | INTRAVENOUS | Status: AC
  Administered 2021-11-11: 2 g via INTRAVENOUS

## 2021-11-11 MED ORDER — ORAL CARE MOUTH RINSE: 15.0000 mL | Freq: Once | OROMUCOSAL | Status: AC

## 2021-11-11 MED ORDER — CEFAZOLIN SODIUM-DEXTROSE 2-4 GM/100ML-% IV SOLN
INTRAVENOUS | Status: AC
  Filled 2021-11-11: qty 100

## 2021-11-11 MED ORDER — ACETAMINOPHEN 10 MG/ML IV SOLN
INTRAVENOUS | Status: AC
  Filled 2021-11-11: qty 100

## 2021-11-11 MED ORDER — GLYCOPYRROLATE 0.2 MG/ML IJ SOLN
INTRAMUSCULAR | Status: DC | PRN
  Administered 2021-11-11: .2 mg via INTRAVENOUS

## 2021-11-11 MED ORDER — PROMETHAZINE HCL 25 MG/ML IJ SOLN: 6.2500 mg | INTRAMUSCULAR | Status: DC | PRN

## 2021-11-11 MED ORDER — DEXAMETHASONE SODIUM PHOSPHATE 10 MG/ML IJ SOLN
INTRAMUSCULAR | Status: DC | PRN
  Administered 2021-11-11: 10 mg via INTRAVENOUS

## 2021-11-11 MED ORDER — ACETAMINOPHEN 10 MG/ML IV SOLN: 1000.0000 mg | Freq: Once | INTRAVENOUS | Status: DC | PRN

## 2021-11-11 MED ORDER — ONDANSETRON HCL 4 MG/2ML IJ SOLN
INTRAMUSCULAR | Status: DC | PRN
  Administered 2021-11-11: 4 mg via INTRAVENOUS

## 2021-11-11 MED ORDER — MIDAZOLAM HCL 2 MG/2ML IJ SOLN
INTRAMUSCULAR | Status: AC
  Filled 2021-11-11: qty 2

## 2021-11-11 SURGICAL SUPPLY — 24 items
BAG DRAIN CYSTO-URO LG1000N (MISCELLANEOUS) ×3 IMPLANT
BRUSH SCRUB EZ  4% CHG (MISCELLANEOUS) ×1
BRUSH SCRUB EZ 1% IODOPHOR (MISCELLANEOUS) ×3 IMPLANT
BRUSH SCRUB EZ 4% CHG (MISCELLANEOUS) ×2 IMPLANT
CATH URETL OPEN 5X70 (CATHETERS) ×3 IMPLANT
DRAPE UTILITY 15X26 TOWEL STRL (DRAPES) ×3 IMPLANT
DRSG TELFA 4X3 1S NADH ST (GAUZE/BANDAGES/DRESSINGS) ×3 IMPLANT
ELECT REM PT RETURN 9FT ADLT (ELECTROSURGICAL) ×3
ELECTRODE REM PT RTRN 9FT ADLT (ELECTROSURGICAL) ×2 IMPLANT
GAUZE 4X4 16PLY ~~LOC~~+RFID DBL (SPONGE) ×6 IMPLANT
GLOVE SURG ENC MOIS LTX SZ6.5 (GLOVE) ×3 IMPLANT
GOWN STRL REUS W/ TWL LRG LVL3 (GOWN DISPOSABLE) ×4 IMPLANT
GOWN STRL REUS W/TWL LRG LVL3 (GOWN DISPOSABLE) ×6
GUIDEWIRE STR DUAL SENSOR (WIRE) ×3 IMPLANT
IV NS IRRIG 3000ML ARTHROMATIC (IV SOLUTION) ×3 IMPLANT
KIT TURNOVER CYSTO (KITS) ×3 IMPLANT
NDL SAFETY ECLIPSE 18X1.5 (NEEDLE) ×2 IMPLANT
NEEDLE HYPO 18GX1.5 SHARP (NEEDLE) ×3
PACK CYSTO AR (MISCELLANEOUS) ×3 IMPLANT
SET CYSTO W/LG BORE CLAMP LF (SET/KITS/TRAYS/PACK) ×3 IMPLANT
SURGILUBE 2OZ TUBE FLIPTOP (MISCELLANEOUS) ×3 IMPLANT
WATER STERILE IRR 1000ML POUR (IV SOLUTION) ×3 IMPLANT
WATER STERILE IRR 3000ML UROMA (IV SOLUTION) ×3 IMPLANT
WATER STERILE IRR 500ML POUR (IV SOLUTION) ×3 IMPLANT

## 2021-11-11 NOTE — Transfer of Care (Signed)
Immediate Anesthesia Transfer of Care Note  Patient: Trevor [name]  Procedure(s) Performed: CYSTOSCOPY WITH BLADDER BIOPSY CYSTOSCOPY WITH RETROGRADE PYELOGRAM (Bilateral)  Patient Location: PACU  Anesthesia Type:General  Level of Consciousness: awake, drowsy and patient cooperative  Airway & Oxygen Therapy: Patient Spontanous Breathing and Patient connected to face mask oxygen  Post-op Assessment: Report given to RN and Post -op Vital signs reviewed and stable  Post vital signs: Reviewed and stable  Last Vitals:  Vitals Value Taken Time  BP 147/76 11/11/21 1209  Temp 37 C 11/11/21 1209  Pulse 93 11/11/21 1212  Resp 18 11/11/21 1212  SpO2 100 % 11/11/21 1212  Vitals shown include unvalidated device data.  Last Pain:  Vitals:   11/11/21 0812  TempSrc: Oral  PainSc: 1          Complications: No notable events documented.

## 2021-11-11 NOTE — Discharge Instructions (Addendum)

## 2021-11-11 NOTE — Interval H&P Note (Signed)
History and Physical Interval Note:  11/11/2021 11:03 AM  Trevor [name]  has presented today for surgery, with the diagnosis of Bladder lesions.  The various methods of treatment have been discussed with the patient and family. After consideration of risks, benefits and other options for treatment, the patient has consented to  Procedure(s): CYSTOSCOPY WITH BLADDER BIOPSY (N/A) CYSTOSCOPY WITH RETROGRADE PYELOGRAM (Bilateral) as a surgical intervention.  The patient's history has been reviewed, patient examined, no change in status, stable for surgery.  I have reviewed the patient's chart and labs.  Questions were answered to the patient's satisfaction.    RRR CTAB   Hollice Espy

## 2021-11-11 NOTE — Anesthesia Preprocedure Evaluation (Addendum)
Anesthesia Evaluation  Patient identified by MRN, date of birth, ID band Patient awake    Reviewed: Allergy & Precautions, H&P , NPO status , Patient's Chart, lab work & pertinent test results  History of Anesthesia Complications Negative for: history of anesthetic complications  Airway Mallampati: III  TM Distance: <3 FB Neck ROM: limited    Dental no notable dental hx.    Pulmonary neg shortness of breath, sleep apnea ,    Pulmonary exam normal        Cardiovascular Exercise Tolerance: Good hypertension, Pt. on medications (-) angina(-) Past MI and (-) DOE Normal cardiovascular exam+ Valvular Problems/Murmurs MR  Rhythm:Regular Rate:Normal     Neuro/Psych PSYCHIATRIC DISORDERS Anxiety Depression  Neuromuscular disease (chronic back pain s/p fusions)    GI/Hepatic Neg liver ROS, GERD  Medicated and Controlled,  Endo/Other  negative endocrine ROS  Renal/GU    Bladder lesions    Musculoskeletal  (+) Arthritis ,   Abdominal Normal abdominal exam  (+)   Peds  Hematology negative hematology ROS (+)   Anesthesia Other Findings Past Medical History: No date: Acne No date: Allergic rhinitis No date: Anemia No date: Anxiety No date: BPH (benign prostatic hypertrophy) with urinary obstruction No date: Cancer [facility])     Comment:  bladder CA; remission since Oct 2014 No date: Carpal tunnel syndrome No date: Cervicalgia No date: Degenerative disc disease, cervical     Comment:  neck and back;  No date: Depression     Comment:  controlled;  No date: Gastritis No date: GERD (gastroesophageal reflux disease) No date: Hemorrhoids No date: History of malignant neoplasm of bladder No date: Hyperlipidemia No date: Hypertension     Comment:  controlled with medication;  No date: Hypospadias No date: Low back pain No date: Mitral regurgitation No date: Pre-diabetes No date: Sleep apnea No date: Urinary  frequency  Past Surgical History: 09/11/2019: ANTERIOR LAT LUMBAR FUSION; N/A     Comment:  Procedure: PRONE XLIF L2-3;  Surgeon: Meade Maw, MD;  Location: [facility] ORS;  Service: Neurosurgery;              Laterality: N/A; No date: BLADDER SURGERY     Comment:  2011, 2013, 2015 07/31/2015: COLONOSCOPY WITH PROPOFOL; N/A     Comment:  Procedure: COLONOSCOPY WITH PROPOFOL;  Surgeon: Manya Silvas, MD;  Location: [facility] ENDOSCOPY;  Service:               Endoscopy;  Laterality: N/A; No date: COLONOSCOPY, ESOPHAGOGASTRODUODENOSCOPY (EGD) AND ESOPHAGEAL  DILATION No date: FLEXIBLE SIGMOIDOSCOPY 06/20/2018: LUMBAR LAMINECTOMY/DECOMPRESSION MICRODISCECTOMY; N/A     Comment:  Procedure: LUMBAR LAMINECTOMY/DECOMPRESSION               MICRODISCECTOMY 1 LEVEL-L-2-3;  Surgeon: Meade Maw, MD;  Location: [facility] ORS;  Service: Neurosurgery;              Laterality: N/A; 09/11/2019: POSTERIOR LUMBAR FUSION 4 WITH HARDWARE REMOVAL; N/A     Comment:  Procedure: L2-S1 POSTERIOR FUSION, ABORTED;  Surgeon:               Meade Maw, MD;  Location: [facility] ORS;  Service:               Neurosurgery;  Laterality:  N/A; No date: SEPTOPLASTY No date: TONSILLECTOMY  BMI    Body Mass Index: 28.54 kg/m      Reproductive/Obstetrics negative OB ROS                            Anesthesia Physical  Anesthesia Plan  ASA: 2  Anesthesia Plan: General ETT   Post-op Pain Management:    Induction: Intravenous  PONV Risk Score and Plan: 2 and Ondansetron, Dexamethasone and Treatment may vary due to age or medical condition  Airway Management Planned: Oral ETT and Video Laryngoscope Planned  Additional Equipment:   Intra-op Plan:   Post-operative Plan: Extubation in OR  Informed Consent: I have reviewed the patients History and Physical, chart, labs and discussed the procedure including the risks, benefits and alternatives  for the proposed anesthesia with the patient or authorized representative who has indicated his/her understanding and acceptance.     Dental Advisory Given  Plan Discussed with: Anesthesiologist, CRNA and Surgeon  Anesthesia Plan Comments: (Patient consented for risks of anesthesia including but not limited to:  - adverse reactions to medications - damage to teeth, lips or other oral mucosa - sore throat or hoarseness - Damage to heart, brain, lungs or loss of life  Patient voiced understanding.)       Anesthesia Quick Evaluation

## 2021-11-11 NOTE — Op Note (Signed)
Date of procedure: 11/11/21  Preoperative diagnosis:  Hypospadias, subcoronal History bladder cancer Bladder erythema  Postoperative diagnosis:  Same as above  Procedure: Cystoscopy Bladder biopsy Bilateral retrograde pyelogram  Surgeon: Hollice Espy, MD  Anesthesia: General  Complications: None  Intraoperative findings: Normal bilateral retrograde pyelogram.  Bladder erythema persistent on left posterior bladder wall, approximately 2 cm.  This area was sampled x4.  Fulgurated for hemostasis.  EBL: Minimal  Specimens: Bladder biopsy  Drains: None  Indication: Trevor [name] is a 75 y.o. patient with personal history of bladder cancer found to have bladder erythema.  After reviewing the management options for treatment, he elected to proceed with the above surgical procedure(s). We have discussed the potential benefits and risks of the procedure, side effects of the proposed treatment, the likelihood of the patient achieving the goals of the procedure, and any potential problems that might occur during the procedure or recuperation. Informed consent has been obtained.  Description of procedure:  The patient was taken to the operating room and general anesthesia was induced.  The patient was placed in the dorsal lithotomy position, prepped and draped in the usual sterile fashion, and preoperative antibiotics were administered. A preoperative time-out was performed.   Male sounds were used to dilate his hypospadiac urethral meatus which was subcoronal up to 28 [place].  A 21 French cystoscope was then advanced per urethra to the bladder.  The bladder was inspected and notably only for an erythematous almost velvety patch of erythema on the left posterior bladder wall which was relatively focal, measuring up approximately 2 cm in diameter.  No other bladder lesions were identified.  Next, attention was turned to the right ureteral orifice which was cannulated using a 5 [place]  open-ended ureteral catheter.  A gentle retrograde pyelogram was performed by the side which showed no hydronephrosis or filling defects.  The same procedure was performed on the left which is also normal without hydronephrosis or filling defects.  Next, cold cup biopsy forceps were used to biopsy the bladder erythema x4.  Bugbee electrocautery was then used to fulgurate this area with water as the medium.  Adequate hemostasis was achieved.  The scope was then removed after the bladder was drained.  The patient was cleaned and dried, repositioned in supine position, reversed from anesthesia, and taken to the PACU in stable condition.  Plan: I will call him with his pathology results.  If requires further intervention, we will have him come into the office to discuss treatment options.  If not, we will continue cystoscopic surveillance.  Hollice Espy, M.D.

## 2021-11-11 NOTE — Anesthesia Procedure Notes (Signed)
Procedure Name: Intubation Date/Time: 11/11/2021 11:39 AM Performed by: Kelton Pillar, CRNA Pre-anesthesia Checklist: Patient identified, Emergency Drugs available, Suction available and Patient being monitored Patient Re-evaluated:Patient Re-evaluated prior to induction Oxygen Delivery Method: Circle system utilized Preoxygenation: Pre-oxygenation with 100% oxygen Induction Type: IV induction Ventilation: Mask ventilation without difficulty Laryngoscope Size: McGraph and 3 Grade View: Grade I Tube type: Oral Tube size: 7.0 mm Number of attempts: 1 Airway Equipment and Method: Stylet and Oral airway Placement Confirmation: ETT inserted through vocal cords under direct vision, positive ETCO2, breath sounds checked- equal and bilateral and CO2 detector Secured at: 21 cm Tube secured with: Tape Dental Injury: Teeth and Oropharynx as per pre-operative assessment

## 2021-11-12 LAB — SURGICAL PATHOLOGY

## 2021-11-12 NOTE — Anesthesia Postprocedure Evaluation (Signed)
Anesthesia Post Note  Patient: Trevor [name]  Procedure(s) Performed: CYSTOSCOPY WITH BLADDER BIOPSY CYSTOSCOPY WITH RETROGRADE PYELOGRAM (Bilateral)  Patient location during evaluation: PACU Anesthesia Type: General Level of consciousness: awake and alert Pain management: pain level controlled Vital Signs Assessment: post-procedure vital signs reviewed and stable Respiratory status: spontaneous breathing, nonlabored ventilation and respiratory function stable Cardiovascular status: blood pressure returned to baseline and stable Postop Assessment: no apparent nausea or vomiting Anesthetic complications: no   No notable events documented.   Last Vitals:  Vitals:   11/11/21 1250 11/11/21 1257  BP:  (!) 166/75  Pulse: 65 70  Resp: 16 17  Temp:  (!) 36 C  SpO2: 100% 100%    Last Pain:  Vitals:   11/12/21 0834  TempSrc:   PainSc: 0-No pain                 [place] Ouch

## 2021-11-17 ENCOUNTER — Telehealth: Payer: Self-pay | Admitting: *Deleted

## 2021-11-17 NOTE — Telephone Encounter (Addendum)
Patient called and scheduled follow up.   ----- Message from Hollice Espy, MD sent at 11/15/2021  1:04 PM EST ----- Please let this patient know that the red spot in his bladder was consistent with a flat type of bladder cancer, CIS.  Looks like he has an appointment next week to talk about his treatment options with me.  Please keep that appointment and if he wants to try to move it up to sooner, that is fine too (looks like I have availability Thursday).  It could also be virtual if he has a video enabled device.  Hollice Espy, MD

## 2021-11-17 NOTE — Progress Notes (Incomplete)
Virtual Visit via Telephone Note  I connected with Trevor [name] on 11/18/2021 at  9:15 AM EST by telephone and verified that I am speaking with the correct person using two identifiers.  Location: Patient: *** Provider: ***   I discussed the limitations, risks, security and privacy concerns of performing an evaluation and management service by telephone and the availability of in person appointments. I also discussed with the patient that there may be a patient responsible charge related to this service. The patient expressed understanding and agreed to proceed.   History of Present Illness: Trevor [name] is a 75 y.o. male with a personal history of bladder cancer, who presents today via telephone to discuss pathology results.  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  He is s/p bladder biopsy on 11/11/2021. Intraoperative findings showed normal bilateral retrograde pyelogram.  Bladder erythema persistent on left posterior bladder wall, approximately 2 cm.  This area was sampled x4. Fulgurated for hemostasis.  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      Observations/Objective:   Assessment and Plan:   Follow Up Instructions:    I discussed the assessment and treatment plan with the patient. The patient was provided an opportunity to ask questions and all were answered. The patient agreed with the plan and demonstrated an understanding of the instructions.   The patient was advised to call back or seek an in-person evaluation if the symptoms worsen or if the condition fails to improve as anticipated.  I  provided *** minutes of non-face-to-face time during this encounter.   I,Kailey Littlejohn,acting as a Education administrator for Hollice Espy, MD.,have documented all relevant documentation on the behalf of Hollice Espy, MD,as directed by  Hollice Espy, MD while in the presence of Hollice Espy, MD.

## 2021-11-18 ENCOUNTER — Other Ambulatory Visit: Payer: Self-pay

## 2021-11-18 ENCOUNTER — Ambulatory Visit (INDEPENDENT_AMBULATORY_CARE_PROVIDER_SITE_OTHER): Payer: Medicare Other | Admitting: Urology

## 2021-11-18 ENCOUNTER — Encounter: Payer: Self-pay | Admitting: Urology

## 2021-11-18 DIAGNOSIS — N329 Bladder disorder, unspecified: Secondary | ICD-10-CM

## 2021-11-18 NOTE — Progress Notes (Signed)
This service is provided via telemedicine   No vital signs collected/recorded due to the encounter was a telemedicine visit.     Patient consents to a telephone visit:  Yes    Names of all persons participating in the telemedicine service and their role in the encounter:  Pinkey Mcjunkin O`Sullivan, RMA   

## 2021-11-23 ENCOUNTER — Telehealth: Payer: Medicare Other | Admitting: Urology

## 2021-11-23 NOTE — Progress Notes (Signed)
11/24/21 11:12 AM   Trevor [name] 1946-08-29 [id number]  Referring provider:  Derinda Late, MD [phone] [facility] [facility] [place],   [id number] Chief Complaint  Patient presents with   Results     HPI: Trevor [name] is a 75 y.o.male with a personal history of bladder cancer, who presents today for pathology results.   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  He is s/p cystoscopy, bladder biopsy, and bilateral retrograde pyelogram on 11/11/2021. Intraoperative findings showed normal bilateral retrograde pyelogram.  Bladder erythema persistent on left posterior bladder wall, approximately 2 cm.  This area was sampled x4.  Fulgurated for hemostasis. 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    He is doing well today. He reports that he had an episode where he could not urinate, he believes this was due to a blood clot.  PMH: Past Medical History:  Diagnosis Date   Acne    Allergic rhinitis    Anemia    Anxiety    BPH (benign prostatic hypertrophy) with urinary obstruction    Cancer (HCC)    bladder CA; remission since Oct 2014   Carpal tunnel syndrome    Cervicalgia    Degenerative disc disease, cervical    neck and back;    Depression    controlled;    Gastritis    GERD (gastroesophageal reflux disease)    Hemorrhoids    History of malignant neoplasm of bladder    Hyperlipidemia    Hypertension    controlled with medication;    Hypospadias    Low back pain    Mitral regurgitation    Pre-diabetes    Sleep apnea    Urinary frequency     Surgical  History: Past Surgical History:  Procedure Laterality Date   ADENOIDECTOMY     ANTERIOR FUSION LUMBAR SPINE  11/20/2017   multiple levels; [name]   ANTERIOR LAT LUMBAR FUSION N/A 09/11/2019   Procedure: PRONE XLIF L2-3;  Surgeon: Meade Maw, MD;  Location: [facility] ORS;  Service: Neurosurgery;  Laterality: N/A;   BLADDER SURGERY     2011, 2013, 2015   COLONOSCOPY WITH PROPOFOL N/A 07/31/2015   Procedure: COLONOSCOPY WITH PROPOFOL;  Surgeon: Manya Silvas, MD;  Location: [facility] ENDOSCOPY;  Service: Endoscopy;  Laterality: N/A;   COLONOSCOPY, ESOPHAGOGASTRODUODENOSCOPY (EGD) AND ESOPHAGEAL DILATION  2006   CYSTOSCOPY W/ RETROGRADES Bilateral 11/11/2021   Procedure: CYSTOSCOPY WITH RETROGRADE PYELOGRAM;  Surgeon: Hollice Espy, MD;  Location: [facility] ORS;  Service: Urology;  Laterality: Bilateral;   CYSTOSCOPY WITH BIOPSY N/A 11/11/2021   Procedure: CYSTOSCOPY WITH BLADDER BIOPSY;  Surgeon: Hollice Espy, MD;  Location: [facility] ORS;  Service: Urology;  Laterality: N/A;   [place]   LUMBAR LAMINECTOMY/DECOMPRESSION MICRODISCECTOMY N/A 06/20/2018   Procedure: LUMBAR LAMINECTOMY/DECOMPRESSION MICRODISCECTOMY 1 LEVEL-L-2-3;  Surgeon: Meade Maw, MD;  Location: [facility] ORS;  Service: Neurosurgery;  Laterality: N/A;   POSTERIOR FUSION LUMBAR SPINE  10/02/2019   L2-4   POSTERIOR LUMBAR FUSION 4 WITH HARDWARE REMOVAL N/A 09/11/2019   Procedure: L2-S1 POSTERIOR FUSION, ABORTED;  Surgeon: Meade Maw, MD;  Location: [facility] ORS;  Service: Neurosurgery;  Laterality: N/A;   SEPTOPLASTY  1996   TONSILLECTOMY      Home Medications:  Allergies as of 11/24/2021       Reactions   Ceftriaxone Hives   Tolerated 1st generation cephalosporin (CEFAZOLIN) on 06/20/2018 and 09/11/2019 without documented ADRs.  Blisters and Hives around mouth and neck.  Some blistering of lips.  Mouth not effected.  Patient started on ceftriaxone and doxycyline 1-2 days prior to reaction.      Doxycycline Hives   Blisters and Hives around mouth and neck.  Some blistering of lips.  Mouth not effected. Patient started on ceftriaxone and doxycyline 1-2 days prior to reaction.          Medication List        Accurate as of November 24, 2021 11:12 AM. If you have any questions, ask your nurse or doctor.          acetaminophen 650 MG CR tablet Commonly known as: TYLENOL Take 1,300 mg by mouth every 8 (eight) hours as needed for pain.   aspirin 81 MG EC tablet Take 81 mg by mouth daily.   fluticasone 50 MCG/ACT nasal spray Commonly known as: FLONASE Place 2 sprays into both nostrils daily as needed for allergies.   lisinopril-hydrochlorothiazide 20-25 MG tablet Commonly known as: ZESTORETIC Take 1 tablet by mouth daily.   multivitamin with minerals Tabs tablet Take 1 tablet by mouth daily.   naproxen sodium 220 MG tablet Commonly known as: ALEVE Take 220 mg by mouth 2 (two) times daily as needed (pain).   Omeprazole 20 MG Tbec Take 20 mg by mouth daily.   OVER THE COUNTER MEDICATION Take 2 capsules by mouth daily. Omega XL   senna-docusate 8.6-50 MG tablet Commonly known as: Senokot-S Take 2 tablets by mouth 2 (two) times daily. What changed: when to take this   simvastatin 40 MG tablet Commonly known as: ZOCOR Take 40 mg by mouth daily.   sucralfate 1 g tablet Commonly known as: CARAFATE Take 1 g by mouth daily.   tamsulosin 0.4 MG Caps capsule Commonly known as: FLOMAX TAKE 1 CAPSULE DAILY   traZODone 100 MG tablet Commonly known as: DESYREL Take 100 mg by mouth at bedtime.   triamcinolone cream 0.1 % Commonly known as: KENALOG Apply 1 application topically daily as needed (rash).   valACYclovir 1000 MG tablet Commonly known as: VALTREX Take 2,000 mg by mouth 2 (two) times daily as needed (fever blisters).   vitamin B-12 500 MCG tablet Commonly known as: CYANOCOBALAMIN Take 500 mcg by mouth daily.        Allergies:  Allergies   Allergen Reactions   Ceftriaxone Hives    Tolerated 1st generation cephalosporin (CEFAZOLIN) on 06/20/2018 and 09/11/2019 without documented ADRs.   Blisters and Hives around mouth and neck.  Some blistering of lips.  Mouth not effected.  Patient started on ceftriaxone and doxycyline 1-2 days prior to reaction.     Doxycycline Hives    Blisters and Hives around mouth and neck.  Some blistering of lips.  Mouth not effected. Patient started on ceftriaxone and doxycyline 1-2 days prior to reaction.      Family History: Family History  Problem Relation Age of Onset   Hyperlipidemia Brother    Hyperlipidemia Sister    Heart attack Father     Social History:  reports that he has never smoked. He has never used smokeless tobacco. He reports that he does not drink alcohol and does  not use drugs.   Physical Exam: BP (!) 174/69    Pulse 77    Ht 5\' 7"  (1.702 m)    Wt 185 lb (83.9 kg)    BMI 28.98 kg/m   Constitutional:  Alert and oriented, No acute distress. HEENT: [place] AT, moist mucus membranes.  Trachea midline, no masses. Cardiovascular: No clubbing, cyanosis, or edema. Respiratory: Normal respiratory effort, no increased work of breathing. Skin: No rashes, bruises or suspicious lesions. Neurologic: Grossly intact, no focal deficits, moving all 4 extremities. Psychiatric: Normal mood and affect.  Laboratory Data:  Lab Results  Component Value Date   CREATININE 0.90 10/27/2021   Lab Results  Component Value Date   HGBA1C 5.4 03/01/2013   Assessment & Plan:    Urothelial carcinoma in SITU  (CIS) - Discussed pathology today  - Discussed induction BCG x6 today. Risk and benefits were reviewed. Risk include BCG sepsis, UTI symptoms, urgency, frequency, and burning. He is agreeable with this plan. He was given a handout today.  -Plan for cystoscopy/urine cytology 3 months after BCG has been completed, if there is any abnormalities, would have low threshold to repeat biopsy.  He is  agreeable this plan.  Return for BCG induction  I,Kailey Littlejohn,acting as a scribe for Hollice Espy, MD.,have documented all relevant documentation on the behalf of Hollice Espy, MD,as directed by  Hollice Espy, MD while in the presence of Hollice Espy, MD.  I have reviewed the above documentation for accuracy and completeness, and I agree with the above.   Hollice Espy, MD   [facility] [address], [place], [place] [address] [phone]

## 2021-11-24 ENCOUNTER — Other Ambulatory Visit: Payer: Self-pay

## 2021-11-24 ENCOUNTER — Ambulatory Visit (INDEPENDENT_AMBULATORY_CARE_PROVIDER_SITE_OTHER): Payer: Medicare Other | Admitting: Urology

## 2021-11-24 ENCOUNTER — Telehealth: Payer: Self-pay

## 2021-11-24 VITALS — BP 174/69 | HR 77 | Ht 67.0 in | Wt 185.0 lb

## 2021-11-24 DIAGNOSIS — D090 Carcinoma in situ of bladder: Secondary | ICD-10-CM

## 2021-11-24 NOTE — Patient Instructions (Signed)

## 2021-11-24 NOTE — Telephone Encounter (Signed)
-----   Message from Hollice Espy, MD sent at 11/24/2021  2:33 PM EST ----- BCG induction course please for CIS

## 2021-12-07 ENCOUNTER — Telehealth: Payer: Self-pay | Admitting: Urology

## 2021-12-07 NOTE — Telephone Encounter (Signed)
Spoke with patient, informed will notify once ready to schedule, possibly delayed due to supply. Voiced understanding.

## 2021-12-07 NOTE — Telephone Encounter (Signed)
Pt called in and said he's supposed to be scheduled for treatments, but I dnt see anything. He would like to speak with someone in regards to this.

## 2021-12-07 NOTE — Telephone Encounter (Signed)
Please call him at [phone].

## 2021-12-23 NOTE — Telephone Encounter (Signed)
Patient scheduled for BCG treatments.  Voiced understanding.

## 2021-12-23 NOTE — Telephone Encounter (Signed)
Patient scheduled for BCG treatments.  Patient expressed understanding.

## 2021-12-23 NOTE — Telephone Encounter (Signed)
BCG shipment received.  Left message for patient to return my call to schedule BCG treatments.

## 2022-01-05 NOTE — Progress Notes (Signed)
BCG Bladder Instillation  BCG # 1/6  Due to Bladder Cancer patient is present today for a BCG treatment. Patient was cleaned and prepped in a sterile fashion with betadine. A 14 FR catheter was inserted, urine return was noted 200 ml, urine was yellow clear in color.  52ml of reconstituted BCG was instilled into the bladder. The catheter was then removed. Patient tolerated well, no complications were noted  Performed by: Zara Council, PA-C and Rebeca A Morris, CMA  Follow up/ Additional notes: One week for #2/6 BCG

## 2022-01-06 ENCOUNTER — Ambulatory Visit: Payer: Medicare Other

## 2022-01-06 ENCOUNTER — Ambulatory Visit (INDEPENDENT_AMBULATORY_CARE_PROVIDER_SITE_OTHER): Payer: Medicare Other | Admitting: Urology

## 2022-01-06 ENCOUNTER — Encounter: Payer: Self-pay | Admitting: Urology

## 2022-01-06 ENCOUNTER — Other Ambulatory Visit: Payer: Self-pay

## 2022-01-06 VITALS — BP 111/74 | HR 76 | Ht 67.0 in | Wt 185.0 lb

## 2022-01-06 DIAGNOSIS — R310 Gross hematuria: Secondary | ICD-10-CM

## 2022-01-06 LAB — URINALYSIS, COMPLETE
Bilirubin, UA: NEGATIVE
Glucose, UA: NEGATIVE
Ketones, UA: NEGATIVE
Nitrite, UA: NEGATIVE
Protein,UA: NEGATIVE
Specific Gravity, UA: 1.01 (ref 1.005–1.030)
Urobilinogen, Ur: 0.2 mg/dL (ref 0.2–1.0)
pH, UA: 6 (ref 5.0–7.5)

## 2022-01-06 LAB — MICROSCOPIC EXAMINATION
Bacteria, UA: NONE SEEN
RBC, Urine: >30 /hpf — AB (ref 0–2)

## 2022-01-06 MED ORDER — BCG LIVE 50 MG IS SUSR
3.2400 mL | Freq: Once | INTRAVESICAL | Status: AC
  Administered 2022-01-06: 81 mg via INTRAVESICAL

## 2022-01-06 NOTE — Patient Instructions (Signed)

## 2022-01-10 ENCOUNTER — Observation Stay
Admission: EM | Admit: 2022-01-10 | Discharge: 2022-01-11 | Disposition: A | Discharge status: Home / Self Care | Payer: Medicare Other | Attending: Internal Medicine | Admitting: Internal Medicine

## 2022-01-10 ENCOUNTER — Emergency Department: Payer: Medicare Other

## 2022-01-10 ENCOUNTER — Encounter: Payer: Self-pay | Admitting: Emergency Medicine

## 2022-01-10 ENCOUNTER — Observation Stay: Payer: Medicare Other

## 2022-01-10 ENCOUNTER — Other Ambulatory Visit: Payer: Self-pay

## 2022-01-10 ENCOUNTER — Telehealth: Payer: Self-pay

## 2022-01-10 DIAGNOSIS — C679 Malignant neoplasm of bladder, unspecified: Secondary | ICD-10-CM | POA: Insufficient documentation

## 2022-01-10 DIAGNOSIS — R42 Dizziness and giddiness: Secondary | ICD-10-CM

## 2022-01-10 DIAGNOSIS — R55 Syncope and collapse: Secondary | ICD-10-CM

## 2022-01-10 DIAGNOSIS — R531 Weakness: Secondary | ICD-10-CM | POA: Diagnosis present

## 2022-01-10 DIAGNOSIS — R3129 Other microscopic hematuria: Secondary | ICD-10-CM | POA: Diagnosis present

## 2022-01-10 DIAGNOSIS — Z8603 Personal history of neoplasm of uncertain behavior: Secondary | ICD-10-CM | POA: Diagnosis present

## 2022-01-10 DIAGNOSIS — N179 Acute kidney failure, unspecified: Secondary | ICD-10-CM | POA: Diagnosis not present

## 2022-01-10 DIAGNOSIS — I10 Essential (primary) hypertension: Secondary | ICD-10-CM | POA: Diagnosis not present

## 2022-01-10 DIAGNOSIS — Z79899 Other long term (current) drug therapy: Secondary | ICD-10-CM | POA: Diagnosis not present

## 2022-01-10 DIAGNOSIS — I959 Hypotension, unspecified: Secondary | ICD-10-CM | POA: Insufficient documentation

## 2022-01-10 DIAGNOSIS — G479 Sleep disorder, unspecified: Secondary | ICD-10-CM | POA: Diagnosis present

## 2022-01-10 DIAGNOSIS — D696 Thrombocytopenia, unspecified: Secondary | ICD-10-CM | POA: Insufficient documentation

## 2022-01-10 DIAGNOSIS — Z7982 Long term (current) use of aspirin: Secondary | ICD-10-CM | POA: Insufficient documentation

## 2022-01-10 DIAGNOSIS — D649 Anemia, unspecified: Secondary | ICD-10-CM | POA: Insufficient documentation

## 2022-01-10 DIAGNOSIS — Z20822 Contact with and (suspected) exposure to covid-19: Secondary | ICD-10-CM | POA: Diagnosis not present

## 2022-01-10 DIAGNOSIS — E785 Hyperlipidemia, unspecified: Secondary | ICD-10-CM | POA: Diagnosis present

## 2022-01-10 LAB — BASIC METABOLIC PANEL
Anion gap: 12 (ref 5–15)
BUN: 38 mg/dL — ABNORMAL HIGH (ref 8–23)
CO2: 21 mmol/L — ABNORMAL LOW (ref 22–32)
Calcium: 9.4 mg/dL (ref 8.9–10.3)
Chloride: 101 mmol/L (ref 98–111)
Creatinine, Ser: 1.98 mg/dL — ABNORMAL HIGH (ref 0.61–1.24)
GFR, Estimated: 35 mL/min — ABNORMAL LOW (ref >60)
Glucose, Bld: 144 mg/dL — ABNORMAL HIGH (ref 70–99)
Potassium: 3.8 mmol/L (ref 3.5–5.1)
Sodium: 134 mmol/L — ABNORMAL LOW (ref 135–145)

## 2022-01-10 LAB — CBC
HCT: 37.3 % — ABNORMAL LOW (ref 39.0–52.0)
Hemoglobin: 11.9 g/dL — ABNORMAL LOW (ref 13.0–17.0)
MCH: 28.6 pg (ref 26.0–34.0)
MCHC: 31.9 g/dL (ref 30.0–36.0)
MCV: 89.7 fL (ref 80.0–100.0)
Platelets: 132 10*3/uL — ABNORMAL LOW (ref 150–400)
RBC: 4.16 MIL/uL — ABNORMAL LOW (ref 4.22–5.81)
RDW: 13.2 % (ref 11.5–15.5)
WBC: 6.7 10*3/uL (ref 4.0–10.5)
nRBC: 0 % (ref 0.0–0.2)

## 2022-01-10 LAB — URINALYSIS, COMPLETE (UACMP) WITH MICROSCOPIC
Bilirubin Urine: NEGATIVE
Glucose, UA: NEGATIVE mg/dL
Ketones, ur: NEGATIVE mg/dL
Nitrite: NEGATIVE
Protein, ur: NEGATIVE mg/dL
RBC / HPF: >50 RBC/hpf — ABNORMAL HIGH (ref 0–5)
Specific Gravity, Urine: 1.01 (ref 1.005–1.030)
WBC, UA: >50 WBC/hpf — ABNORMAL HIGH (ref 0–5)
pH: 5 (ref 5.0–8.0)

## 2022-01-10 LAB — HEPATIC FUNCTION PANEL
ALT: 25 U/L (ref 0–44)
AST: 39 U/L (ref 15–41)
Albumin: 4.1 g/dL (ref 3.5–5.0)
Alkaline Phosphatase: 74 U/L (ref 38–126)
Bilirubin, Direct: <0.1 mg/dL (ref 0.0–0.2)
Total Bilirubin: 0.5 mg/dL (ref 0.3–1.2)
Total Protein: 6.9 g/dL (ref 6.5–8.1)

## 2022-01-10 LAB — MAGNESIUM: Magnesium: 2.1 mg/dL (ref 1.7–2.4)

## 2022-01-10 LAB — RESP PANEL BY RT-PCR (FLU A&B, COVID) ARPGX2
Influenza A by PCR: NEGATIVE
Influenza B by PCR: NEGATIVE
SARS Coronavirus 2 by RT PCR: NEGATIVE

## 2022-01-10 MED ORDER — ACETAMINOPHEN 650 MG RE SUPP: 650.0000 mg | Freq: Four times a day (QID) | RECTAL | Status: DC | PRN

## 2022-01-10 MED ORDER — MELATONIN 5 MG PO TABS: 5.0000 mg | ORAL_TABLET | Freq: Every evening | ORAL | Status: DC | PRN

## 2022-01-10 MED ORDER — SIMVASTATIN 10 MG PO TABS: 40.0000 mg | ORAL_TABLET | Freq: Every day | ORAL | Status: DC

## 2022-01-10 MED ORDER — SENNOSIDES-DOCUSATE SODIUM 8.6-50 MG PO TABS
2.0000 | ORAL_TABLET | Freq: Every day | ORAL | Status: DC
  Administered 2022-01-11: 2 via ORAL
  Filled 2022-01-10 (×2): qty 2

## 2022-01-10 MED ORDER — TRAZODONE HCL 100 MG PO TABS
100.0000 mg | ORAL_TABLET | Freq: Every day | ORAL | Status: DC
Start: 2022-01-10 — End: 2022-01-11
  Administered 2022-01-10: 100 mg via ORAL
  Filled 2022-01-10: qty 1

## 2022-01-10 MED ORDER — LACTATED RINGERS IV BOLUS
1000.0000 mL | Freq: Once | INTRAVENOUS | Status: AC
  Administered 2022-01-10: 1000 mL via INTRAVENOUS

## 2022-01-10 MED ORDER — CYANOCOBALAMIN 500 MCG PO TABS
500.0000 ug | ORAL_TABLET | Freq: Every day | ORAL | Status: DC
  Filled 2022-01-10: qty 1

## 2022-01-10 MED ORDER — ASPIRIN EC 81 MG PO TBEC
81.0000 mg | DELAYED_RELEASE_TABLET | Freq: Every day | ORAL | Status: DC
  Administered 2022-01-11: 81 mg via ORAL
  Filled 2022-01-10: qty 1

## 2022-01-10 MED ORDER — ONDANSETRON HCL 4 MG PO TABS: 4.0000 mg | ORAL_TABLET | Freq: Four times a day (QID) | ORAL | Status: DC | PRN

## 2022-01-10 MED ORDER — HYDRALAZINE HCL 20 MG/ML IJ SOLN: 5.0000 mg | Freq: Four times a day (QID) | INTRAMUSCULAR | Status: DC | PRN

## 2022-01-10 MED ORDER — PANTOPRAZOLE SODIUM 40 MG PO TBEC
40.0000 mg | DELAYED_RELEASE_TABLET | Freq: Every day | ORAL | Status: DC
  Administered 2022-01-11: 40 mg via ORAL
  Filled 2022-01-10: qty 1

## 2022-01-10 MED ORDER — ACETAMINOPHEN 325 MG PO TABS
650.0000 mg | ORAL_TABLET | Freq: Four times a day (QID) | ORAL | Status: DC | PRN
  Administered 2022-01-10: 650 mg via ORAL
  Filled 2022-01-10: qty 2

## 2022-01-10 MED ORDER — LACTATED RINGERS IV SOLN: INTRAVENOUS | Status: AC

## 2022-01-10 MED ORDER — HEPARIN SODIUM (PORCINE) 5000 UNIT/ML IJ SOLN
5000.0000 [IU] | Freq: Three times a day (TID) | INTRAMUSCULAR | Status: DC
  Administered 2022-01-10 – 2022-01-11 (×2): 5000 [IU] via SUBCUTANEOUS
  Filled 2022-01-10 (×2): qty 1

## 2022-01-10 MED ORDER — ONDANSETRON HCL 4 MG/2ML IJ SOLN: 4.0000 mg | Freq: Four times a day (QID) | INTRAMUSCULAR | Status: DC | PRN

## 2022-01-10 MED ORDER — TAMSULOSIN HCL 0.4 MG PO CAPS
0.4000 mg | ORAL_CAPSULE | Freq: Every day | ORAL | Status: DC
  Administered 2022-01-11: 0.4 mg via ORAL
  Filled 2022-01-10: qty 1

## 2022-01-10 NOTE — ED Provider Notes (Signed)
[facility] Provider Note    Event Date/Time   First MD Initiated Contact with Patient 01/10/22 1808     (approximate)   History   Hypotension   HPI  Trevor [name] is a 76 y.o. male who presents to the ED for evaluation of Hypotension   I review outpatient urology visit from 12/8, recent cystoscopy and bladder biopsy on 12/1. BCG immunotherapy bladder instillation on 1/26, 4 days ago. History of HTN on lisinopril/HCTZ.  HLD, GERD.  Patient presents to the ED for evaluation of generalized weakness, positional presyncope and hypotension at home.  Due to a couple days of feeling weak and lightheaded dizziness, he checked his blood pressure this morning and shows me 3 documented numbers that he brings with him of 80s/50s.  Due to these low values, he presents to the ED for evaluation.  Since his intravesicular BCG treatment a few days ago, patient reports there is laying around at home for a few days and having poor intake.  Denies emesis or diarrhea. Does report he forgot to take his antihypertensive regimen Friday and Saturday, so yesterday and today he took a double dose of his antihypertensives.  Reports that he does not have the urge to void, and has had less output today.  Physical Exam   Triage Vital Signs: ED Triage Vitals [01/10/22 1443]  Enc Vitals Group     BP 111/67     Pulse Rate 80     Resp 20     Temp 98.1 F (36.7 C)     Temp src      SpO2 98 %     Weight 185 lb (83.9 kg)     Height 5\' 7"  (1.702 m)     Head Circumference      Peak Flow      Pain Score 5     Pain Loc      Pain Edu?      Excl. in [place]?     Most recent vital signs: Vitals:   01/10/22 1443 01/10/22 1822  BP: 111/67 115/70  Pulse: 80 85  Resp: 20 17  Temp: 98.1 F (36.7 C) 98.2 F (36.8 C)  SpO2: 98% 96%    General: Awake, no distress.  Dry mucous membranes.  Pleasant and conversational in full sentences. CV:  Good peripheral perfusion. RRR Resp:  Normal  effort.  Abd:  No distention.  Benign exam.  No tenderness MSK:  No deformity noted.  Neuro:  No focal deficits appreciated. Other:     ED Results / Procedures / Treatments   Labs (all labs ordered are listed, but only abnormal results are displayed) Labs Reviewed  CBC - Abnormal; Notable for the following components:      Result Value   RBC 4.16 (*)    Hemoglobin 11.9 (*)    HCT 37.3 (*)    Platelets 132 (*)    All other components within normal limits  BASIC METABOLIC PANEL - Abnormal; Notable for the following components:   Sodium 134 (*)    CO2 21 (*)    Glucose, Bld 144 (*)    BUN 38 (*)    Creatinine, Ser 1.98 (*)    GFR, Estimated 35 (*)    All other components within normal limits  RESP PANEL BY RT-PCR (FLU A&B, COVID) ARPGX2  URINALYSIS, COMPLETE (UACMP) WITH MICROSCOPIC    EKG  Sinus rhythm, rate of 93 bpm.  Leftward axis.  Normal intervals.  Nonspecific  ST changes to inferior and lateral leads without STEMI or clear evidence of acute ischemia  RADIOLOGY US Renal pending  Official radiology report(s): No results found.  PROCEDURES and INTERVENTIONS:  .1-3 Lead EKG Interpretation Performed by: Vladimir Crofts, MD Authorized by: Vladimir Crofts, MD     Interpretation: normal     ECG rate:  80   ECG rate assessment: normal     Rhythm: sinus rhythm     Ectopy: none     Conduction: normal    Medications  lactated ringers bolus 1,000 mL (1,000 mLs Intravenous New Bag/Given 01/10/22 1821)     IMPRESSION / MDM / ASSESSMENT AND PLAN / ED COURSE  I reviewed the triage vital signs and the nursing notes.  76 year old male being treated for bladder cancer presents to the ED with presyncopal dizziness and home hypotension, found to have a AKI that is likely prerenal and requiring medical observation admission.  His vitals are normal here in the ED, but he reportedly has had hypotensive pressures at home.  Blood work here demonstrates AKI that appears prerenal  without significant electrolyte derangements.  CBC without acute derangements.  No evidence of sepsis or systemic illness.  We will initiate IV fluids here in the ED to treat likely dehydration in the setting of poor intake.  We will perform ultrasounds of his kidneys and bladder.  He has no urge to void and I doubt outlet obstruction.  We will consult with hospitalist and anticipate he will require medical observation admission due to the severity of his creatinine bump.      FINAL CLINICAL IMPRESSION(S) / ED DIAGNOSES   Final diagnoses:  AKI (acute kidney injury) ([place])     Rx / DC Orders   ED Discharge Orders     None        Note:  This document was prepared using Dragon voice recognition software and may include unintentional dictation errors.   Vladimir Crofts, MD 01/10/22 [phone]

## 2022-01-10 NOTE — Assessment & Plan Note (Signed)
-   Continue outpatient follow-up with urologist

## 2022-01-10 NOTE — Assessment & Plan Note (Signed)
-   Patient takes lisinopril-hydrochlorothiazide 20-25 tablets daily at home, I am holding this at this time due to presentation of acute kidney injury and hypotension - Hydralazine 5 mg IV every 6 hours as needed for SBP greater than 170, 4 doses ordered

## 2022-01-10 NOTE — Assessment & Plan Note (Signed)
-   Hemoglobin on admission is 11.9, which is within baseline range of 2022 in 11.4-12.1 - CBC in the a.m.

## 2022-01-10 NOTE — Hospital Course (Signed)
76 year old male with history of hypertension, hyperlipidemia, personal history of carcinoma in situ of the bladder, status post BCG bladder instillation on 01/06/2022, GERD, BPH, chronic thrombocytopenia, chronic anemia, who presents emergency department for chief concerns of weakness and hypotension.  Initial vitals in the emergency department showed temperature of 98.1, respiration rate of 20, heart rate of 80, blood pressure 111/67, SPO2 of 98% on room air.  Labs in the emergency department showed serum sodium 134, potassium 3.8, chloride 101, bicarb 21, BUN of 38, serum creatinine of 1.98, nonfasting blood glucose 144, GFR 35, WBC 6.7, hemoglobin 11.9, platelets of 132.  In the emergency department patient received 1 L lactated ringer bolus.  EDP ordered renal ultrasound.

## 2022-01-10 NOTE — Assessment & Plan Note (Signed)
-   Resumed home simvastatin 40 mg nightly

## 2022-01-10 NOTE — ED Triage Notes (Signed)
Pt via POV from home. Pt c/o lightheadedness and headaches. States that his mouth is really dry. Pt states that he has a hx of hypertension but he has not taken any medication today. Pt states his BP today was 80/50s. Pt is A&Ox4 and NAD.

## 2022-01-10 NOTE — Assessment & Plan Note (Signed)
-   Outpatient follow-up, within baseline range

## 2022-01-10 NOTE — H&P (Signed)
History and Physical   Trevor [name] YIF:[id number] DOB: 1945/12/24 DOA: 01/10/2022  PCP: Derinda Late, MD Outpatient Specialists: Dr. Hollice Espy, urology Patient coming from: Home via EMS  I have personally briefly reviewed patient's old medical records in [place].  Chief Concern: Dizziness and hypotension  HPI: 76 year old male with history of hypertension, hyperlipidemia, personal history of carcinoma in situ of the bladder, status post BCG bladder instillation on 01/06/2022, GERD, BPH, chronic thrombocytopenia, chronic anemia, who presents emergency department for chief concerns of weakness and hypotension.  Initial vitals in the emergency department showed temperature of 98.1, respiration rate of 20, heart rate of 80, blood pressure 111/67, SPO2 of 98% on room air.  Labs in the emergency department showed serum sodium 134, potassium 3.8, chloride 101, bicarb 21, BUN of 38, serum creatinine of 1.98, nonfasting blood glucose 144, GFR 35, WBC 6.7, hemoglobin 11.9, platelets of 132.  In the emergency department patient received 1 L lactated ringer bolus.  EDP ordered renal ultrasound. _______  At bedside he is able to tell me his age, name, current location of hospital, and current calendar year.   He missed antihypertensive medications on Friday and Saturday. On both Sunday and Monday, he doubled his antihypertensive medication dose.  He felt weak and dizzy and he checked his blood pressure on day of presentation and noted that it was low, 85/56 and then dropping to the 70s over 50s.  He called outpatient clinic and was advised to call EMS to present to the emergency department immediately for further evaluation.  He further endorses that in the last week he has had low appetite and has not been eating and/or drinking like normal.  He reports this is never happened before.  He denies shortness of breath, chest pain, weakness, dysuria, diarrhea, abdominal pain, syncope.    Social history: he lives by himself. He denies tobacco use, etoh, recreational drug use. He works as a Glass blower/designer.   Vaccination history: He is vaccinated for covid 19, two doses and two booster.   ROS: Constitutional: no weight change, no fever ENT/Mouth: no sore throat, no rhinorrhea Eyes: no eye pain, no vision changes Cardiovascular: no chest pain, no dyspnea,  no edema, no palpitations Respiratory: no cough, no sputum, no wheezing Gastrointestinal: no nausea, no vomiting, no diarrhea, no constipation Genitourinary: no urinary incontinence, no dysuria, no hematuria Musculoskeletal: no arthralgias, no myalgias Skin: no skin lesions, no pruritus, Neuro: + weakness, no loss of consciousness, no syncope Psych: no anxiety, no depression, no decrease appetite Heme/Lymph: no bruising, no bleeding  ED Course: Discussed with emergency medicine provider, patient requiring hospitalization for chief concerns of hypotension and acute kidney injury.  Assessment/Plan  Principal Problem:   AKI (acute kidney injury) ([place]) Active Problems:   Absolute anemia   Microscopic hematuria   HLD (hyperlipidemia)   History of neoplasm of bladder   Sleep disturbance   Essential hypertension   Thrombocytopenia (HCC)   Dizziness   * AKI (acute kidney injury) ([place]) Assessment & Plan - Multifactorial including intrarenal (doubling up on home antihypertensive medications) and prerenal (poor p.o. intake) - Patient's baseline serum creatinine in 2022 per care everywhere is 0.9/GFR 94 - No prior CKD - Status post lactated ringer 1 L bolus per EDP - Ordered to start lactated ringer 150 mL/h, for 12 hours after completion of lactated ringer 1 L bolus - Add on a LFT to prior collection, discussed with lab to verify availability of blood collection - BMP in  the a.m.  Essential hypertension Assessment & Plan - Patient takes lisinopril-hydrochlorothiazide 20-25 tablets daily at home, I am holding  this at this time due to presentation of acute kidney injury and hypotension - Hydralazine 5 mg IV every 6 hours as needed for SBP greater than 170, 4 doses ordered  Thrombocytopenia (HCC) Assessment & Plan - Outpatient follow-up, within baseline range  Dizziness Assessment & Plan - Holding home gabapentin at this time due to patient presentation of hypotension, dizziness, and weakness  Sleep disturbance Assessment & Plan - Melatonin 5 mg nightly as needed for sleep, 3 nights ordered  History of neoplasm of bladder Assessment & Plan - Continue outpatient follow-up with urologist  HLD (hyperlipidemia) Assessment & Plan - Resumed home simvastatin 40 mg nightly  Absolute anemia Assessment & Plan - Hemoglobin on admission is 11.9, which is within baseline range of 2022 in 11.4-12.1 - CBC in the a.m.  Chart reviewed.   DVT prophylaxis: Heparin 5000 units subcutaneous daily 8 hours Code Status: Full code Diet: Heart healthy; continue order for encourage p.o. intake Family Communication: None Disposition Plan: Pending clinical course Consults called: None at this time Admission status: MedSurg, observation, no telemetry  Past Medical History:  Diagnosis Date   Acne    Allergic rhinitis    Anemia    Anxiety    BPH (benign prostatic hypertrophy) with urinary obstruction    Cancer ([place])    bladder CA; remission since Oct 2014   Carpal tunnel syndrome    Cervicalgia    Degenerative disc disease, cervical    neck and back;    Depression    controlled;    Gastritis    GERD (gastroesophageal reflux disease)    Hemorrhoids    History of malignant neoplasm of bladder    Hyperlipidemia    Hypertension    controlled with medication;    Hypospadias    Low back pain    Mitral regurgitation    Pre-diabetes    Sleep apnea    Urinary frequency    Past Surgical History:  Procedure Laterality Date   ADENOIDECTOMY     ANTERIOR FUSION LUMBAR SPINE  11/20/2017   multiple  levels; [name]   ANTERIOR LAT LUMBAR FUSION N/A 09/11/2019   Procedure: PRONE XLIF L2-3;  Surgeon: Meade Maw, MD;  Location: [facility] ORS;  Service: Neurosurgery;  Laterality: N/A;   BLADDER SURGERY     2011, 2013, 2015   COLONOSCOPY WITH PROPOFOL N/A 07/31/2015   Procedure: COLONOSCOPY WITH PROPOFOL;  Surgeon: Manya Silvas, MD;  Location[facility];  Service: Endoscopy;  Laterality: N/A;   COLONOSCOPY, ESOPHAGOGASTRODUODENOSCOPY (EGD) AND ESOPHAGEAL DILATION  2006   CYSTOSCOPY W/ RETROGRADES Bilateral 11/11/2021   Procedure: CYSTOSCOPY WITH RETROGRADE PYELOGRAM;  Surgeon: Hollice Espy, MD;  Location: [facility] ORS;  Service: Urology;  Laterality: Bilateral;   CYSTOSCOPY WITH BIOPSY N/A 11/11/2021   Procedure: CYSTOSCOPY WITH BLADDER BIOPSY;  Surgeon: Hollice Espy, MD;  Location: [facility] ORS;  Service: Urology;  Laterality: N/A;   [place]   LUMBAR LAMINECTOMY/DECOMPRESSION MICRODISCECTOMY N/A 06/20/2018   Procedure: LUMBAR LAMINECTOMY/DECOMPRESSION MICRODISCECTOMY 1 LEVEL-L-2-3;  Surgeon: Meade Maw, MD;  Location: [facility] ORS;  Service: Neurosurgery;  Laterality: N/A;   POSTERIOR FUSION LUMBAR SPINE  10/02/2019   L2-4   POSTERIOR LUMBAR FUSION 4 WITH HARDWARE REMOVAL N/A 09/11/2019   Procedure: L2-S1 POSTERIOR FUSION, ABORTED;  Surgeon: Meade Maw, MD;  Location: [facility] ORS;  Service: Neurosurgery;  Laterality: N/A;   SEPTOPLASTY  1996  TONSILLECTOMY     Social History:  reports that he has never smoked. He has never used smokeless tobacco. He reports that he does not drink alcohol and does not use drugs.  Allergies  Allergen Reactions   Ceftriaxone Hives    Tolerated 1st generation cephalosporin (CEFAZOLIN) on 06/20/2018 and 09/11/2019 without documented ADRs.   Blisters and Hives around mouth and neck.  Some blistering of lips.  Mouth not effected.  Patient started on ceftriaxone and doxycyline 1-2 days prior to reaction.      Doxycycline Hives    Blisters and Hives around mouth and neck.  Some blistering of lips.  Mouth not effected. Patient started on ceftriaxone and doxycyline 1-2 days prior to reaction.     Family History  Problem Relation Age of Onset   Hyperlipidemia Brother    Hyperlipidemia Sister    Heart attack Father    Family history: Family history reviewed and not pertinent.  Prior to Admission medications   Medication Sig Start Date End Date Taking? Authorizing Provider  acetaminophen (TYLENOL) 650 MG CR tablet Take 1,300 mg by mouth every 8 (eight) hours as needed for pain.    [provider]  aspirin 81 MG EC tablet Take 81 mg by mouth daily.    [provider]  fluticasone (FLONASE) 50 MCG/ACT nasal spray Place 2 sprays into both nostrils daily as needed for allergies.    [provider]  lisinopril-hydrochlorothiazide (ZESTORETIC) 20-25 MG tablet Take 1 tablet by mouth daily. 02/16/21   [provider]  Multiple Vitamin (MULTIVITAMIN WITH MINERALS) TABS tablet Take 1 tablet by mouth daily.    [provider]  naproxen sodium (ALEVE) 220 MG tablet Take 220 mg by mouth 2 (two) times daily as needed (pain).    [provider]  Omeprazole 20 MG TBEC Take 20 mg by mouth daily.    [provider]  OVER THE COUNTER MEDICATION Take 2 capsules by mouth daily. Omega XL    [provider]  senna-docusate (SENOKOT-S) 8.6-50 MG tablet Take 2 tablets by mouth 2 (two) times daily. Patient taking differently: Take 2 tablets by mouth daily. 07/13/18   Love, Ivan Anchors, PA-C  simvastatin (ZOCOR) 40 MG tablet Take 40 mg by mouth daily.    [provider]  sucralfate (CARAFATE) 1 G tablet Take 1 g by mouth daily.    [provider]  tamsulosin (FLOMAX) 0.4 MG CAPS capsule TAKE 1 CAPSULE DAILY 12/29/20   Ernestine Conrad, Larene Beach A, PA-C  traZODone (DESYREL) 100 MG tablet Take 100 mg by mouth at bedtime.    [provider]   triamcinolone cream (KENALOG) 0.1 % Apply 1 application topically daily as needed (rash). 10/04/21   [provider]  valACYclovir (VALTREX) 1000 MG tablet Take 2,000 mg by mouth 2 (two) times daily as needed (fever blisters). 11/01/21   [provider]  vitamin B-12 (CYANOCOBALAMIN) 500 MCG tablet Take 500 mcg by mouth daily.    [provider]   Physical Exam: Vitals:   01/10/22 2130 01/10/22 2145 01/10/22 2200 01/10/22 2215  BP: (!) 158/76     Pulse: 66 65 64 63  Resp: 15 18 17 18   Temp:      TempSrc:      SpO2: 100% 100% 100% 98%  Weight:      Height:       Constitutional: appears age-appropriate, frail, NAD, calm, comfortable Eyes: PERRL, lids and conjunctivae normal ENMT: Mucous membranes are moist. Posterior pharynx  clear of any exudate or lesions. Age-appropriate dentition. Hearing appropriate Neck: normal, supple, no masses, no thyromegaly Respiratory: clear to auscultation bilaterally, no wheezing, no crackles. Normal respiratory effort. No accessory muscle use.  Cardiovascular: Regular rate and rhythm, no murmurs / rubs / gallops. No extremity edema. 2+ pedal pulses. No carotid bruits.  Abdomen: no tenderness, no masses palpated, no hepatosplenomegaly. Bowel sounds positive.  Musculoskeletal: no clubbing / cyanosis. No joint deformity upper and lower extremities. Good ROM, no contractures, no atrophy. Normal muscle tone.  Skin: no rashes, lesions, ulcers. No induration Neurologic: Sensation intact. Strength 5/5 in all 4.  Psychiatric: Normal judgment and insight. Alert and oriented x 3. Normal mood.   EKG: independently reviewed, showing sinus rhythm with rate of 93, QTc 445.  Chest x-ray on Admission: I personally reviewed and I agree with radiologist reading as below.  US Renal  Result Date: 01/10/2022 CLINICAL DATA:  Acute kidney injury. Being treated for bladder cancer. EXAM: RENAL / URINARY TRACT ULTRASOUND COMPLETE COMPARISON:  None.  FINDINGS: Right Kidney: Renal measurements: 10.0 x 4.8 x 4.9 = volume: 122 mL. Echogenicity within normal limits. No mass or hydronephrosis visualized. Left Kidney: Renal measurements: 10.7 x 4.9 x 5.2 = volume: 141 mL. Echogenicity within normal limits. No mass or hydronephrosis visualized. Bladder: Appears normal for degree of bladder distention. Other: Prostate is unremarkable. IMPRESSION: No evidence of nephrolithiasis or hydronephrosis or appreciable renal mass. Electronically Signed   By: Keane Police D.O.   On: 01/10/2022 19:14   DG Chest Port 1 View  Result Date: 01/10/2022 CLINICAL DATA:  Lightheadedness and headaches, initial encounter EXAM: PORTABLE CHEST 1 VIEW COMPARISON:  03/01/2013 FINDINGS: Cardiac shadow is within normal limits. Aortic calcifications are seen. The lungs are clear. No bony abnormality is noted. IMPRESSION: No active disease. Electronically Signed   By: Inez Catalina M.D.   On: 01/10/2022 19:15    Labs on Admission: I have personally reviewed following labs CBC: Recent Labs  Lab 01/10/22 1445  WBC 6.7  HGB 11.9*  HCT 37.3*  MCV 89.7  PLT 295*   Basic Metabolic Panel: Recent Labs  Lab 01/10/22 1445  NA 134*  K 3.8  CL 101  CO2 21*  GLUCOSE 144*  BUN 38*  CREATININE 1.98*  CALCIUM 9.4  MG 2.1   GFR: Estimated Creatinine Clearance: 33.4 mL/min (A) (by C-G formula based on SCr of 1.98 mg/dL (H)).  Urine analysis:    Component Value Date/Time   COLORURINE YELLOW 01/10/2022 2047   APPEARANCEUR CLEAR 01/10/2022 2047   APPEARANCEUR Hazy (A) 01/06/2022 1413   LABSPEC 1.010 01/10/2022 2047   PHURINE 5.0 01/10/2022 2047   GLUCOSEU NEGATIVE 01/10/2022 2047   HGBUR LARGE (A) 01/10/2022 2047   BILIRUBINUR NEGATIVE 01/10/2022 2047   BILIRUBINUR Negative 01/06/2022 [place] 01/10/2022 2047   PROTEINUR NEGATIVE 01/10/2022 2047   NITRITE NEGATIVE 01/10/2022 2047   LEUKOCYTESUR SMALL (A) 01/10/2022 2047   Dr. Tobie Poet [facility]  If 7PM-7AM, please contact overnight-coverage provider If 7AM-7PM, please contact day coverage provider [email or web address]  01/11/2022, 12:54 AM

## 2022-01-10 NOTE — Assessment & Plan Note (Signed)
-   Holding home gabapentin at this time due to patient presentation of hypotension, dizziness, and weakness

## 2022-01-10 NOTE — Telephone Encounter (Signed)
Incoming call from pt who states that he has checked BP this morning shortly after waking and the reading was 85/56. His BP has since dropped to 76/50. He c/o dizziness and feeling lightheaded. Upon further questioning patient states that he usually takes 1/2 of lisinopril, but missed a few doses and decided to take 2 full doses to compensate. In addition the patient states that he has had a weak urinary stream today. He has drank tea, water, and coke with little output. Patient advised to call EMS immediately as it is not safe for him to drive himself in to our office or the ED. Pt voiced understanding and state he will call now.

## 2022-01-10 NOTE — Assessment & Plan Note (Addendum)
-   Multifactorial including intrarenal (doubling up on home antihypertensive medications) and prerenal (poor p.o. intake) - Patient's baseline serum creatinine in 2022 per care everywhere is 0.9/GFR 94 - No prior CKD - Status post lactated ringer 1 L bolus per EDP - Ordered to start lactated ringer 150 mL/h, for 12 hours after completion of lactated ringer 1 L bolus - Add on a LFT to prior collection, discussed with lab to verify availability of blood collection - BMP in the a.m.

## 2022-01-10 NOTE — Assessment & Plan Note (Signed)
-   Melatonin 5 mg nightly as needed for sleep, 3 nights ordered

## 2022-01-11 DIAGNOSIS — N179 Acute kidney failure, unspecified: Secondary | ICD-10-CM | POA: Diagnosis not present

## 2022-01-11 LAB — BASIC METABOLIC PANEL
Anion gap: 7 (ref 5–15)
BUN: 27 mg/dL — ABNORMAL HIGH (ref 8–23)
CO2: 27 mmol/L (ref 22–32)
Calcium: 8.9 mg/dL (ref 8.9–10.3)
Chloride: 102 mmol/L (ref 98–111)
Creatinine, Ser: 1.05 mg/dL (ref 0.61–1.24)
GFR, Estimated: >60 mL/min (ref >60)
Glucose, Bld: 100 mg/dL — ABNORMAL HIGH (ref 70–99)
Potassium: 3.6 mmol/L (ref 3.5–5.1)
Sodium: 136 mmol/L (ref 135–145)

## 2022-01-11 LAB — CBC
HCT: 33.1 % — ABNORMAL LOW (ref 39.0–52.0)
Hemoglobin: 11 g/dL — ABNORMAL LOW (ref 13.0–17.0)
MCH: 28.7 pg (ref 26.0–34.0)
MCHC: 33.2 g/dL (ref 30.0–36.0)
MCV: 86.4 fL (ref 80.0–100.0)
Platelets: 98 10*3/uL — ABNORMAL LOW (ref 150–400)
RBC: 3.83 MIL/uL — ABNORMAL LOW (ref 4.22–5.81)
RDW: 13.2 % (ref 11.5–15.5)
WBC: 3.6 10*3/uL — ABNORMAL LOW (ref 4.0–10.5)
nRBC: 0 % (ref 0.0–0.2)

## 2022-01-11 NOTE — Discharge Instructions (Signed)
Keep log of BP at home and review with PCP Pt advised not to double up the dose incase he misses to take BP meds

## 2022-01-11 NOTE — Discharge Summary (Signed)
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   Carpal tunnel syndrome    Cervicalgia    Degenerative disc disease, cervical    neck and back;    Depression    controlled;    Gastritis    GERD (gastroesophageal reflux disease)    Hemorrhoids    History of malignant neoplasm of bladder    Hyperlipidemia    Hypertension    controlled with medication;    Hypospadias    Low back pain    Mitral regurgitation    Pre-diabetes    Sleep apnea    Urinary frequency     HOSPITAL COURSE:   76 year old male with history of hypertension, hyperlipidemia, personal history of carcinoma in situ of the bladder, status post BCG bladder instillation on 01/06/2022, GERD, BPH, chronic thrombocytopenia, chronic anemia, who presents emergency department for chief concerns of weakness and hypotension.  He missed antihypertensive medications on Friday and Saturday. On both Sunday and Monday, he doubled his antihypertensive medication dose.  He felt weak and dizzy and he checked his blood pressure on day of presentation and noted that it was low, 85/56 and then dropping to the 70s over 50s..  AKI/pre-renal azotemia with hypotension and dizziness Pt had doubled up his meds since he missed 2 days of taking it --came in with hypotension and elevated creat --Received IVF --creat 1.98-->1.05 --pt feesl back  to [place] --ambulated to the [place] few times--no issues  Bladder cancer --follows with [facility] for BCG treatment  HLD --cont statins  Overall stable D/c home. Pt in agreement   CONSULTS OBTAINED:    DRUG ALLERGIES:   Allergies  Allergen Reactions   Ceftriaxone Hives    Tolerated 1st generation cephalosporin (CEFAZOLIN) on 06/20/2018 and 09/11/2019 without documented ADRs.   Blisters and Hives around mouth and neck.  Some blistering of lips.  Mouth not effected.  Patient started on ceftriaxone and doxycyline 1-2 days prior to reaction.     Doxycycline Hives    Blisters and Hives around mouth and neck.  Some blistering of lips.  Mouth not effected. Patient started on ceftriaxone and doxycyline 1-2 days prior to reaction.      DISCHARGE MEDICATIONS:   Allergies as of 01/11/2022       Reactions   Ceftriaxone Hives   Tolerated 1st generation cephalosporin (CEFAZOLIN) on 06/20/2018 and 09/11/2019 without documented ADRs.  Blisters and Hives around mouth and neck.  Some blistering of lips.  Mouth not effected.  Patient started on ceftriaxone and doxycyline 1-2 days prior to reaction.     Doxycycline Hives   Blisters and Hives around mouth and neck.  Some blistering of lips.  Mouth not effected. Patient started on ceftriaxone and doxycyline 1-2 days prior to reaction.          Medication List  STOP taking these medications    sucralfate 1 g tablet Commonly known as: CARAFATE       TAKE these medications    acetaminophen 650 MG CR tablet Commonly known as: TYLENOL Take 1,300 mg by mouth every 8 (eight) hours as needed for pain.   aspirin 81 MG EC tablet Take 81 mg by mouth daily.   fluticasone 50 MCG/ACT nasal spray Commonly known as: FLONASE Place 2 sprays into both nostrils daily as needed for allergies.   gabapentin 300 MG capsule Commonly known as: NEURONTIN Take 300 mg by mouth 2 (two) times daily.   lisinopril-hydrochlorothiazide 20-25 MG  tablet Commonly known as: ZESTORETIC Take 0.5 tablets by mouth daily.   multivitamin with minerals Tabs tablet Take 1 tablet by mouth daily.   naproxen sodium 220 MG tablet Commonly known as: ALEVE Take 220 mg by mouth 2 (two) times daily as needed (pain).   Omeprazole 20 MG Tbec Take 20 mg by mouth daily.   OVER THE COUNTER MEDICATION Take 2 capsules by mouth daily. Omega XL   senna-docusate 8.6-50 MG tablet Commonly known as: Senokot-S Take 2 tablets by mouth 2 (two) times daily. What changed: when to take this   simvastatin 40 MG tablet Commonly known as: ZOCOR Take 40 mg by mouth daily.   tamsulosin 0.4 MG Caps capsule Commonly known as: FLOMAX TAKE 1 CAPSULE DAILY   traZODone 100 MG tablet Commonly known as: DESYREL Take 100 mg by mouth at bedtime.   triamcinolone cream 0.1 % Commonly known as: KENALOG Apply 1 application topically daily as needed (rash).   valACYclovir 1000 MG tablet Commonly known as: VALTREX Take 2,000 mg by mouth 2 (two) times daily as needed (fever blisters).   vitamin B-12 500 MCG tablet Commonly known as: CYANOCOBALAMIN Take 500 mcg by mouth daily.        If you experience worsening of your admission symptoms, develop shortness of breath, life threatening emergency, suicidal or homicidal thoughts you must seek medical attention immediately by calling 911 or calling your MD immediately  if symptoms less severe.  You Must read complete instructions/literature along with all the possible adverse reactions/side effects for all the Medicines you take and that have been prescribed to you. Take any new Medicines after you have completely understood and accept all the possible adverse reactions/side effects.   Please note  You were cared for by a hospitalist during your hospital stay. If you have any questions about your discharge medications or the care you received while you were in the hospital after you are discharged, you can call the  unit and asked to speak with the hospitalist on call if the hospitalist that took care of you is not available. Once you are discharged, your primary care physician will handle any further medical issues. Please note that NO REFILLS for any discharge medications will be authorized once you are discharged, as it is imperative that you return to your primary care physician (or establish a relationship with a primary care physician if you do not have one) for your aftercare needs so that they can reassess your need for medications and monitor your lab values. Today   SUBJECTIVE   Dong well. BP stable No cp, sob or dizziness  VITAL SIGNS:  Blood pressure (!) 118/43, pulse 64, temperature 98.2 F (36.8 C), temperature source Oral, resp. rate 17, height 5\' 7"  (1.702 m), weight 83.9 kg, SpO2 96 %.  I/O:  No intake or output data in the  24 hours ending 01/11/22 1300  PHYSICAL EXAMINATION:  GENERAL:  76 y.o.-year-old patient lying in the bed with no acute distress.  LUNGS: Normal breath sounds bilaterally, no wheezing, rales,rhonchi or crepitation.  CARDIOVASCULAR: S1, S2 normal. No murmurs, rubs, or gallops.  ABDOMEN: Soft, non-tender, non-distended. Bowel sounds present.  EXTREMITIES: No  clubbing.  NEUROLOGIC: non-focal PSYCHIATRIC:  patient is alert and awake  SKIN: No obvious rash, lesion, or ulcer.   DATA REVIEW:   CBC  Recent Labs  Lab 01/11/22 0659  WBC 3.6*  HGB 11.0*  HCT 33.1*  PLT 98*    Chemistries  Recent Labs  Lab 01/10/22 1445 01/11/22 0659  NA 134* 136  K 3.8 3.6  CL 101 102  CO2 21* 27  GLUCOSE 144* 100*  BUN 38* 27*  CREATININE 1.98* 1.05  CALCIUM 9.4 8.9  MG 2.1  --   AST 39  --   ALT 25  --   ALKPHOS 74  --   BILITOT 0.5  --     Microbiology Results   Recent Results (from the past 240 hour(s))  Microscopic Examination     Status: Abnormal   Collection Time: 01/06/22  2:13 PM   Urine  Result Value Ref Range Status   WBC, UA 6-10 (A) 0 - 5  /hpf Final   RBC >30 (A) 0 - 2 /hpf Final   Epithelial Cells (non renal) 0-10 0 - 10 /hpf Final   Bacteria, UA None seen None seen/Few Final  Resp Panel by RT-PCR (Flu A&B, Covid) Nasopharyngeal Swab     Status: None   Collection Time: 01/10/22  6:26 PM   Specimen: Nasopharyngeal Swab; Nasopharyngeal(NP) swabs in vial transport medium  Result Value Ref Range Status   SARS Coronavirus 2 by RT PCR NEGATIVE NEGATIVE Final    Comment: (NOTE) SARS-CoV-2 target nucleic acids are NOT DETECTED.  The SARS-CoV-2 RNA is generally detectable in upper respiratory specimens during the acute phase of infection. The lowest concentration of SARS-CoV-2 viral copies this assay can detect is 138 copies/mL. A negative result does not preclude SARS-Cov-2 infection and should not be used as the sole basis for treatment or other patient management decisions. A negative result may occur with  improper specimen collection/handling, submission of specimen other than nasopharyngeal swab, presence of viral mutation(s) within the areas targeted by this assay, and inadequate number of viral copies(<138 copies/mL). A negative result must be combined with clinical observations, patient history, and epidemiological information. The expected result is Negative.  Fact Sheet for Patients:  EntrepreneurPulse.com.au  Fact Sheet for Healthcare Providers:  IncredibleEmployment.be  This test is no t yet approved or cleared by the [place] FDA and  has been authorized for detection and/or diagnosis of SARS-CoV-2 by FDA under an Emergency Use Authorization (EUA). This EUA will remain  in effect (meaning this test can be used) for the duration of the COVID-19 declaration under Section 564(b)(1) of the Act, 21 U.S.C.section 360bbb-3(b)(1), unless the authorization is terminated  or revoked sooner.       Influenza A by PCR NEGATIVE NEGATIVE Final   Influenza B by PCR NEGATIVE  NEGATIVE Final    Comment: (NOTE) The Xpert Xpress SARS-CoV-2/FLU/RSV plus assay is intended as an aid in the diagnosis of influenza from Nasopharyngeal swab specimens and should not be used as a sole basis for treatment. Nasal washings and aspirates are unacceptable for Xpert Xpress SARS-CoV-2/FLU/RSV testing.  Fact Sheet for Patients: EntrepreneurPulse.com.au  Fact Sheet for Healthcare Providers:  IncredibleEmployment.be  This test is not yet approved or cleared by the [place] and has been authorized for detection and/or diagnosis of SARS-CoV-2 by FDA under an Emergency Use Authorization (EUA). This EUA will remain in effect (meaning this test can be used) for the duration of the COVID-19 declaration under Section 564(b)(1) of the Act, 21 U.S.C. section 360bbb-3(b)(1), unless the authorization is terminated or revoked.  Performed at [facility], [address], [place], [place] [address]     RADIOLOGY:  US Renal  Result Date: 01/10/2022 CLINICAL DATA:  Acute kidney injury. Being treated for bladder cancer. EXAM: RENAL / URINARY TRACT ULTRASOUND COMPLETE COMPARISON:  None. FINDINGS: Right Kidney: Renal measurements: 10.0 x 4.8 x 4.9 = volume: 122 mL. Echogenicity within normal limits. No mass or hydronephrosis visualized. Left Kidney: Renal measurements: 10.7 x 4.9 x 5.2 = volume: 141 mL. Echogenicity within normal limits. No mass or hydronephrosis visualized. Bladder: Appears normal for degree of bladder distention. Other: Prostate is unremarkable. IMPRESSION: No evidence of nephrolithiasis or hydronephrosis or appreciable renal mass. Electronically Signed   By: Keane Police D.O.   On: 01/10/2022 19:14   DG Chest Port 1 View  Result Date: 01/10/2022 CLINICAL DATA:  Lightheadedness and headaches, initial encounter EXAM: PORTABLE CHEST 1 VIEW COMPARISON:  03/01/2013 FINDINGS: Cardiac shadow is within normal limits. Aortic  calcifications are seen. The lungs are clear. No bony abnormality is noted. IMPRESSION: No active disease. Electronically Signed   By: Inez Catalina M.D.   On: 01/10/2022 19:15     CODE STATUS:     Code Status Orders  (From admission, onward)           Start     Ordered   01/10/22 1842  Full code  Continuous        01/10/22 1843           Code Status History     Date Active Date Inactive Code Status Order ID Comments User Context   10/02/2019 1443 10/06/2019 1737 Full Code [id number]  Marin Olp, PA-C Inpatient   09/11/2019 1331 09/16/2019 1659 Full Code [id number]  Marin Olp, PA-C Inpatient   06/22/2018 1836 07/13/2018 1602 Full Code [id number]  Bary Leriche, PA-C Inpatient   06/22/2018 1836 06/22/2018 1836 Full Code [id number]  Flora Lipps Inpatient   06/20/2018 2002 06/22/2018 1815 Full Code [id number]  Marin Olp, PA-C Inpatient        TOTAL TIME TAKING CARE OF THIS PATIENT: 35 minutes.    Fritzi Mandes M.D  [facility]    CC: Primary care physician; Derinda Late, MD

## 2022-01-13 ENCOUNTER — Other Ambulatory Visit: Payer: Self-pay

## 2022-01-13 ENCOUNTER — Ambulatory Visit (INDEPENDENT_AMBULATORY_CARE_PROVIDER_SITE_OTHER): Payer: Medicare Other

## 2022-01-13 DIAGNOSIS — D090 Carcinoma in situ of bladder: Secondary | ICD-10-CM | POA: Diagnosis not present

## 2022-01-13 LAB — MICROSCOPIC EXAMINATION
Bacteria, UA: NONE SEEN
RBC, Urine: >30 /HPF — AB (ref 0–2)

## 2022-01-13 LAB — URINALYSIS, COMPLETE
Bilirubin, UA: NEGATIVE
Glucose, UA: NEGATIVE
Ketones, UA: NEGATIVE
Nitrite, UA: NEGATIVE
Protein,UA: NEGATIVE
Specific Gravity, UA: 1.015 (ref 1.005–1.030)
Urobilinogen, Ur: 0.2 mg/dL (ref 0.2–1.0)
pH, UA: 5.5 (ref 5.0–7.5)

## 2022-01-13 MED ORDER — BCG LIVE 50 MG IS SUSR
3.2400 mL | Freq: Once | INTRAVESICAL | Status: AC
  Administered 2022-01-13: 81 mg via INTRAVESICAL

## 2022-01-13 NOTE — Progress Notes (Signed)
BCG Bladder Instillation  BCG # 2 of 6  Due to Bladder Cancer patient is present today for a BCG treatment. Patient was cleaned and prepped in a sterile fashion with betadine. A 14FR catheter was inserted, urine return was noted 146ml, urine was yellow in color.  67ml of reconstituted BCG was instilled into the bladder. The catheter was then removed. Patient tolerated well, no complications were noted  Performed by: Kerman Passey, RMA  Follow up/ Additional notes: as directed.

## 2022-01-20 ENCOUNTER — Other Ambulatory Visit: Payer: Self-pay

## 2022-01-20 ENCOUNTER — Ambulatory Visit (INDEPENDENT_AMBULATORY_CARE_PROVIDER_SITE_OTHER): Payer: Medicare Other

## 2022-01-20 DIAGNOSIS — D090 Carcinoma in situ of bladder: Secondary | ICD-10-CM

## 2022-01-20 LAB — URINALYSIS, COMPLETE
Bilirubin, UA: NEGATIVE
Glucose, UA: NEGATIVE
Ketones, UA: NEGATIVE
Nitrite, UA: NEGATIVE
Protein,UA: NEGATIVE
Specific Gravity, UA: 1.025 (ref 1.005–1.030)
Urobilinogen, Ur: 0.2 mg/dL (ref 0.2–1.0)
pH, UA: 6 (ref 5.0–7.5)

## 2022-01-20 LAB — MICROSCOPIC EXAMINATION: RBC, Urine: >30 /hpf — AB (ref 0–2)

## 2022-01-20 MED ORDER — BCG LIVE 50 MG IS SUSR
3.2400 mL | Freq: Once | INTRAVESICAL | Status: AC
  Administered 2022-01-20: 81 mg via INTRAVESICAL

## 2022-01-20 NOTE — Progress Notes (Signed)
BCG Bladder Instillation  BCG # 3 of 6  Due to Bladder Cancer patient is present today for a BCG treatment. Patient was cleaned and prepped in a sterile fashion with betadine. A 14FR catheter was inserted, urine return was noted 46ml, urine was dark yellow in color.  31ml of reconstituted BCG was instilled into the bladder. The catheter was then removed. Patient tolerated well, no complications were noted  Performed by: Lesli Albee & Rebeca Morris CMA  Follow up/ Additional notes: as directed.

## 2022-01-27 ENCOUNTER — Ambulatory Visit (INDEPENDENT_AMBULATORY_CARE_PROVIDER_SITE_OTHER): Payer: Medicare Other

## 2022-01-27 ENCOUNTER — Other Ambulatory Visit: Payer: Self-pay | Admitting: Urology

## 2022-01-27 ENCOUNTER — Other Ambulatory Visit: Payer: Self-pay

## 2022-01-27 DIAGNOSIS — D090 Carcinoma in situ of bladder: Secondary | ICD-10-CM

## 2022-01-27 DIAGNOSIS — N400 Enlarged prostate without lower urinary tract symptoms: Secondary | ICD-10-CM

## 2022-01-27 LAB — URINALYSIS, COMPLETE
Bilirubin, UA: NEGATIVE
Glucose, UA: NEGATIVE
Ketones, UA: NEGATIVE
Nitrite, UA: NEGATIVE
Protein,UA: NEGATIVE
Specific Gravity, UA: 1.015 (ref 1.005–1.030)
Urobilinogen, Ur: 0.2 mg/dL (ref 0.2–1.0)
pH, UA: 6 (ref 5.0–7.5)

## 2022-01-27 LAB — MICROSCOPIC EXAMINATION

## 2022-01-27 MED ORDER — BCG LIVE 50 MG IS SUSR
3.2400 mL | Freq: Once | INTRAVESICAL | Status: AC
  Administered 2022-01-27: 81 mg via INTRAVESICAL

## 2022-01-27 NOTE — Progress Notes (Signed)
BCG Bladder Instillation  BCG # 4 of 6  Due to Bladder Cancer patient is present today for a BCG treatment. Patient was cleaned and prepped in a sterile fashion with betadine. A 14FR catheter was inserted, urine return was noted 38ml, urine was yellow in color.  89ml of reconstituted BCG was instilled into the bladder. The catheter was then removed. Patient tolerated well, no complications were noted  Performed by: Lesli Albee & Rebeca Morris CMA  Follow up/ Additional notes: as directed. One week

## 2022-02-03 ENCOUNTER — Ambulatory Visit: Payer: Medicare Other

## 2022-02-04 ENCOUNTER — Other Ambulatory Visit: Payer: Self-pay

## 2022-02-04 ENCOUNTER — Ambulatory Visit: Payer: Medicare Other | Admitting: *Deleted

## 2022-02-04 ENCOUNTER — Ambulatory Visit: Payer: Medicare Other

## 2022-02-04 DIAGNOSIS — R310 Gross hematuria: Secondary | ICD-10-CM

## 2022-02-04 LAB — URINALYSIS, COMPLETE
Bilirubin, UA: NEGATIVE
Glucose, UA: NEGATIVE
Ketones, UA: NEGATIVE
Nitrite, UA: POSITIVE — AB
Specific Gravity, UA: 1.02 (ref 1.005–1.030)
Urobilinogen, Ur: 0.2 mg/dL (ref 0.2–1.0)
pH, UA: 7.5 (ref 5.0–7.5)

## 2022-02-04 LAB — MICROSCOPIC EXAMINATION: WBC, UA: >30 /hpf — AB (ref 0–5)

## 2022-02-04 NOTE — Progress Notes (Signed)
Urine came back positive, cancel bcg

## 2022-02-06 ENCOUNTER — Encounter: Payer: Self-pay | Admitting: Urology

## 2022-02-07 NOTE — Telephone Encounter (Signed)
Patient informed per Dr. Erlene Quan wait for culture results. Patient aware to report any worsening symptoms. Voiced understanding.

## 2022-02-07 NOTE — Telephone Encounter (Signed)
BCG cancelled on 02/04/22 due to urine nitrate positive. Spoke with patient, he has been having increased dysuria, hematuria, feeling fatigued. Denies fevers, nausea or vomiting. Urine culture still pending.

## 2022-02-08 ENCOUNTER — Encounter: Payer: Self-pay | Admitting: Urology

## 2022-02-08 ENCOUNTER — Telehealth: Payer: Self-pay | Admitting: *Deleted

## 2022-02-08 LAB — CULTURE, URINE COMPREHENSIVE

## 2022-02-08 MED ORDER — SULFAMETHOXAZOLE-TRIMETHOPRIM 800-160 MG PO TABS: 1.0000 | ORAL_TABLET | Freq: Two times a day (BID) | ORAL | 0 refills | Status: DC

## 2022-02-08 NOTE — Addendum Note (Signed)
Addended by: Verlene Mayer A on: 02/08/2022 03:44 PM   Modules accepted: Orders

## 2022-02-08 NOTE — Telephone Encounter (Signed)
Per Dr. Sarajane Jews looks like his prelim growing, gram-negative rods. Lets go ahead and treat Bactrim BID for seven days. If he scheduled for BCG this week, lets defer another week.   Patient informed, voiced understanding.  Rescheduled BCG

## 2022-02-10 ENCOUNTER — Ambulatory Visit: Payer: Medicare Other

## 2022-02-11 NOTE — Telephone Encounter (Signed)
Error

## 2022-02-17 ENCOUNTER — Ambulatory Visit (INDEPENDENT_AMBULATORY_CARE_PROVIDER_SITE_OTHER): Payer: Medicare Other

## 2022-02-17 ENCOUNTER — Other Ambulatory Visit: Payer: Self-pay

## 2022-02-17 DIAGNOSIS — D090 Carcinoma in situ of bladder: Secondary | ICD-10-CM

## 2022-02-17 MED ORDER — BCG LIVE 50 MG IS SUSR
3.2400 mL | Freq: Once | INTRAVESICAL | Status: AC
  Administered 2022-02-17: 81 mg via INTRAVESICAL

## 2022-02-17 NOTE — Progress Notes (Signed)
BCG Bladder Instillation ? ?BCG # 5 ? ?Due to Bladder Cancer patient is present today for a BCG treatment. Patient was cleaned and prepped in a sterile fashion with betadine. A 16FR catheter was inserted, urine return was noted 150 ml, urine was yellow in color.  44m of reconstituted BCG was instilled into the bladder. The catheter was then removed. Patient tolerated well, no complications were noted ? ?Performed by: ALesli Albee& CBradly BienenstockCMA ? ?Follow up/ Additional notes: 1 wk ?  ?

## 2022-02-18 LAB — URINALYSIS, COMPLETE
Bilirubin, UA: NEGATIVE
Glucose, UA: NEGATIVE
Ketones, UA: NEGATIVE
Leukocytes,UA: NEGATIVE
Nitrite, UA: NEGATIVE
Protein,UA: NEGATIVE
Specific Gravity, UA: 1.015 (ref 1.005–1.030)
Urobilinogen, Ur: 0.2 mg/dL (ref 0.2–1.0)
pH, UA: 5.5 (ref 5.0–7.5)

## 2022-02-18 LAB — MICROSCOPIC EXAMINATION
Bacteria, UA: NONE SEEN
RBC, Urine: >30 /hpf — AB (ref 0–2)

## 2022-02-24 ENCOUNTER — Ambulatory Visit (INDEPENDENT_AMBULATORY_CARE_PROVIDER_SITE_OTHER): Payer: Medicare Other | Admitting: *Deleted

## 2022-02-24 ENCOUNTER — Other Ambulatory Visit: Payer: Self-pay

## 2022-02-24 DIAGNOSIS — D090 Carcinoma in situ of bladder: Secondary | ICD-10-CM

## 2022-02-24 MED ORDER — BCG LIVE 50 MG IS SUSR
3.2400 mL | Freq: Once | INTRAVESICAL | Status: AC
  Administered 2022-02-24: 81 mg via INTRAVESICAL

## 2022-02-24 NOTE — Progress Notes (Signed)
BCG Bladder Instillation ? ?BCG # 6 ? ?Due to Bladder Cancer patient is present today for a BCG treatment. Patient was cleaned and prepped in a sterile fashion with betadine. A 14FR catheter was inserted, urine return was noted 159m, urine was yellow in color.  580mof reconstituted BCG was instilled into the bladder. The catheter was then removed. Patient tolerated well, no complications were noted ? ?Performed by: ReVerlene MayerCMProvencalnd DeEdwin DadaCMK[place] ?Follow up/ Additional notes: 3 months cysto as scheduled ? ? ?

## 2022-02-25 LAB — URINALYSIS, COMPLETE
Bilirubin, UA: NEGATIVE
Glucose, UA: NEGATIVE
Ketones, UA: NEGATIVE
Leukocytes,UA: NEGATIVE
Nitrite, UA: NEGATIVE
Protein,UA: NEGATIVE
Specific Gravity, UA: 1.02 (ref 1.005–1.030)
Urobilinogen, Ur: 0.2 mg/dL (ref 0.2–1.0)
pH, UA: 5.5 (ref 5.0–7.5)

## 2022-02-25 LAB — MICROSCOPIC EXAMINATION
Bacteria, UA: NONE SEEN
RBC, Urine: >30 /hpf — AB (ref 0–2)

## 2022-04-05 ENCOUNTER — Other Ambulatory Visit: Payer: Self-pay | Admitting: Urology

## 2022-05-10 ENCOUNTER — Telehealth: Payer: Self-pay | Admitting: Urology

## 2022-05-10 NOTE — Telephone Encounter (Signed)
Patient called the office today with report of gross hematuria over the weekend.   Patient is scheduled for cysto post BCG treatments on 05/17/22 and is wanting to know if we need to see him or check his urine prior to his appt next week.  Please advise.

## 2022-05-10 NOTE — Telephone Encounter (Signed)
Spoke with patient, hematuria has improved. Denies any other symptoms. Aware will check urine the day of procedure. Voiced understanding to call if symptoms worsen.

## 2022-05-11 ENCOUNTER — Encounter: Payer: Self-pay | Admitting: Urology

## 2022-05-11 ENCOUNTER — Ambulatory Visit (INDEPENDENT_AMBULATORY_CARE_PROVIDER_SITE_OTHER): Payer: Medicare Other | Admitting: Urology

## 2022-05-11 VITALS — BP 112/71 | HR 89 | Ht 67.0 in | Wt 180.0 lb

## 2022-05-11 DIAGNOSIS — R310 Gross hematuria: Secondary | ICD-10-CM | POA: Diagnosis not present

## 2022-05-11 MED ORDER — SULFAMETHOXAZOLE-TRIMETHOPRIM 800-160 MG PO TABS: 1.0000 | ORAL_TABLET | Freq: Two times a day (BID) | ORAL | 0 refills | Status: DC

## 2022-05-11 NOTE — Progress Notes (Signed)
05/11/2022 3:24 PM   Trevor [name] 05-Apr-1946 [id number]  Referring provider: Derinda Late, MD [phone] [address] [place],  [place] [address]  Urological history: 1. 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 Elevated PSA -PSA trend 4.04, 08/2021 2.30, 01/2020 3.78, 01/2019 4.8, 01/2018  3. BPH with LU TS -tamsulosin 0.4 mg daily   Chief Complaint  Patient presents with   Hematuria    HPI: Trevor [name] is a 76 y.o. male who presents today for an urgent appointment for gross heme.      He started seeing gross heme on Monday with small clots x 2.  His urine was clear Tuesday during the day, but he had some gross heme that evening.   He may have had gross heme during the night, but it was difficult to know due to low lighting.  Today, he has been having continuous streams of gross heme with intermittent episodes of clots.   He lower abdomen feels like he "ate something that didn't agree with him" and that started yesterday.    Patient denies any modifying or aggravating factors.  Patient denies any dysuria or flank pain.  Patient denies any fevers, chills, nausea or vomiting.    His UA was 6-10 WBC's, > 30 RBC's and few bacteria.  The urine looked like merlot grossly.    He takes a 81 mg daily ASA.    PMH: Past Medical History:  Diagnosis Date   Acne    Allergic rhinitis    Anemia    Anxiety    BPH (benign prostatic hypertrophy) with urinary obstruction    Cancer (HCC)    bladder CA; remission since Oct 2014   Carpal tunnel syndrome    Cervicalgia    Degenerative disc disease, cervical    neck and back;    Depression    controlled;    Gastritis    GERD (gastroesophageal reflux disease)     Hemorrhoids    History of malignant neoplasm of bladder    Hyperlipidemia    Hypertension    controlled with medication;    Hypospadias    Low back pain    Mitral regurgitation    Pre-diabetes    Sleep apnea    Urinary frequency     Surgical History: Past Surgical History:  Procedure Laterality Date   ADENOIDECTOMY     ANTERIOR FUSION LUMBAR SPINE  11/20/2017   multiple levels; [name]   ANTERIOR LAT LUMBAR FUSION N/A 09/11/2019   Procedure: PRONE XLIF L2-3;  Surgeon: Meade Maw, MD;  Location: [facility];  Service: Neurosurgery;  Laterality: N/A;   BLADDER SURGERY     2011, 2013, 2015   COLONOSCOPY WITH PROPOFOL N/A 07/31/2015   Procedure: COLONOSCOPY WITH PROPOFOL;  Surgeon: Manya Silvas, MD;  Location: [facility];  Service: Endoscopy;  Laterality: N/A;   COLONOSCOPY, ESOPHAGOGASTRODUODENOSCOPY (EGD) AND ESOPHAGEAL DILATION  2006   CYSTOSCOPY W/ RETROGRADES Bilateral 11/11/2021   Procedure: CYSTOSCOPY WITH RETROGRADE PYELOGRAM;  Surgeon: Hollice Espy, MD;  Location: [facility];  Service: Urology;  Laterality: Bilateral;   CYSTOSCOPY WITH BIOPSY N/A 11/11/2021   Procedure: CYSTOSCOPY WITH BLADDER BIOPSY;  Surgeon: Hollice Espy, MD;  Location: [facility];  Service: Urology;  Laterality: N/A;   [place]  LUMBAR LAMINECTOMY/DECOMPRESSION MICRODISCECTOMY N/A 06/20/2018   Procedure: LUMBAR LAMINECTOMY/DECOMPRESSION MICRODISCECTOMY 1 LEVEL-L-2-3;  Surgeon: Meade Maw, MD;  Location: [facility];  Service: Neurosurgery;  Laterality: N/A;   POSTERIOR FUSION LUMBAR SPINE  10/02/2019   L2-4   POSTERIOR LUMBAR FUSION 4 WITH HARDWARE REMOVAL N/A 09/11/2019   Procedure: L2-S1 POSTERIOR FUSION, ABORTED;  Surgeon: Meade Maw, MD;  Location: [facility];  Service: Neurosurgery;  Laterality: N/A;   SEPTOPLASTY  1996   TONSILLECTOMY      Home Medications:  Allergies as of 05/11/2022       Reactions   Ceftriaxone Hives   Tolerated 1st  generation cephalosporin (CEFAZOLIN) on 06/20/2018 and 09/11/2019 without documented ADRs.  Blisters and Hives around mouth and neck.  Some blistering of lips.  Mouth not effected.  Patient started on ceftriaxone and doxycyline 1-2 days prior to reaction.     Doxycycline Hives   Blisters and Hives around mouth and neck.  Some blistering of lips.  Mouth not effected. Patient started on ceftriaxone and doxycyline 1-2 days prior to reaction.          Medication List        Accurate as of May 11, 2022  3:24 PM. If you have any questions, ask your nurse or doctor.          acetaminophen 650 MG CR tablet Commonly known as: TYLENOL Take 1,300 mg by mouth every 8 (eight) hours as needed for pain.   aspirin EC 81 MG tablet Take 81 mg by mouth daily.   fluticasone 50 MCG/ACT nasal spray Commonly known as: FLONASE Place 2 sprays into both nostrils daily as needed for allergies.   gabapentin 300 MG capsule Commonly known as: NEURONTIN Take 300 mg by mouth 2 (two) times daily.   lisinopril-hydrochlorothiazide 20-25 MG tablet Commonly known as: ZESTORETIC Take 0.5 tablets by mouth daily.   multivitamin with minerals Tabs tablet Take 1 tablet by mouth daily.   naproxen sodium 220 MG tablet Commonly known as: ALEVE Take 220 mg by mouth 2 (two) times daily as needed (pain).   Omeprazole 20 MG Tbec Take 20 mg by mouth daily.   OVER THE COUNTER MEDICATION Take 2 capsules by mouth daily. Omega XL   senna-docusate 8.6-50 MG tablet Commonly known as: Senokot-S Take 2 tablets by mouth 2 (two) times daily. What changed: when to take this   simvastatin 40 MG tablet Commonly known as: ZOCOR Take 40 mg by mouth daily.   sulfamethoxazole-trimethoprim 800-160 MG tablet Commonly known as: BACTRIM DS Take 1 tablet by mouth every 12 (twelve) hours.   tamsulosin 0.4 MG Caps capsule Commonly known as: FLOMAX TAKE 1 CAPSULE DAILY   traZODone 100 MG tablet Commonly known as:  DESYREL Take 100 mg by mouth at bedtime.   triamcinolone cream 0.1 % Commonly known as: KENALOG Apply 1 application topically daily as needed (rash).   valACYclovir 1000 MG tablet Commonly known as: VALTREX Take 2,000 mg by mouth 2 (two) times daily as needed (fever blisters).   vitamin B-12 500 MCG tablet Commonly known as: CYANOCOBALAMIN Take 500 mcg by mouth daily.        Allergies:  Allergies  Allergen Reactions   Ceftriaxone Hives    Tolerated 1st generation cephalosporin (CEFAZOLIN) on 06/20/2018 and 09/11/2019 without documented ADRs.   Blisters and Hives around mouth and neck.  Some blistering of lips.  Mouth not effected.  Patient started on ceftriaxone and doxycyline 1-2 days prior to reaction.  Doxycycline Hives    Blisters and Hives around mouth and neck.  Some blistering of lips.  Mouth not effected. Patient started on ceftriaxone and doxycyline 1-2 days prior to reaction.      Family History: Family History  Problem Relation Age of Onset   Hyperlipidemia Brother    Hyperlipidemia Sister    Heart attack Father     Social History:  reports that he has never smoked. He has never used smokeless tobacco. He reports that he does not drink alcohol and does not use drugs.  ROS: Pertinent ROS in HPI  Physical Exam: BP 112/71   Pulse 89   Ht '5\' 7"'$  (1.702 m)   Wt 180 lb (81.6 kg)   BMI 28.19 kg/m   Constitutional:  Well nourished. Alert and oriented, No acute distress. HEENT: [place] AT, moist mucus membranes.  Trachea midline Cardiovascular: No clubbing, cyanosis, or edema. Respiratory: Normal respiratory effort, no increased work of breathing. Neurologic: Grossly intact, no focal deficits, moving all 4 extremities. Psychiatric: Normal mood and affect.  Laboratory Data: Lab Results  Component Value Date   WBC 3.6 (L) 01/11/2022   HGB 11.0 (L) 01/11/2022   HCT 33.1 (L) 01/11/2022   MCV 86.4 01/11/2022   PLT 98 (L) 01/11/2022    Lab Results   Component Value Date   CREATININE 1.05 01/11/2022    Lab Results  Component Value Date   AST 39 01/10/2022   Lab Results  Component Value Date   ALT 25 01/10/2022     Urinalysis Red cloudy, 6-10 WBC's, > 30 RBC's and few bacteria.   I have reviewed the labs.   Pertinent Imaging: N/A  Assessment & Plan:    1. Gross hematuria/Suprapubic pain  -symptomatic  -history of bladder cancer with cysto pending -UA 6-10 WBC's, > 30 RBC's and few bacteria  -UA is sent for culture -started on Septra DS  -instructed to stop the ASA until hematuria clears -increase water intake  -advised to contact the office if gross heme persists after starting the antibiotic and stopping the ASA   Return for keep appointment on the 6th with Dr. Erlene Quan .  These notes generated with voice recognition software. I apologize for typographical errors.  Zara Council, PA-C  [facility] [address]  [place], [place] [address] [phone]

## 2022-05-12 LAB — MICROSCOPIC EXAMINATION: RBC, Urine: >30 /hpf — AB (ref 0–2)

## 2022-05-12 LAB — URINALYSIS, COMPLETE
Bilirubin, UA: NEGATIVE
Glucose, UA: NEGATIVE
Ketones, UA: NEGATIVE
Nitrite, UA: NEGATIVE
Specific Gravity, UA: 1.025 (ref 1.005–1.030)
Urobilinogen, Ur: 0.2 mg/dL (ref 0.2–1.0)
pH, UA: 6 (ref 5.0–7.5)

## 2022-05-14 LAB — CULTURE, URINE COMPREHENSIVE

## 2022-05-16 ENCOUNTER — Telehealth: Payer: Self-pay

## 2022-05-16 NOTE — Telephone Encounter (Signed)
-----   Message from Nori Riis, PA-C sent at 05/15/2022  6:13 PM EDT ----- Please let Trevor [name] know that his urine culture was negative for infection.  He can stop his antibiotic, Septra DS/Bactrim DS, but he needs to keep his appointment with Dr. Erlene Quan this week.

## 2022-05-16 NOTE — Telephone Encounter (Signed)
OK per DPR, LMOM notifying pt of msg. Advised pt to return call if he has any questions.

## 2022-05-17 ENCOUNTER — Ambulatory Visit (INDEPENDENT_AMBULATORY_CARE_PROVIDER_SITE_OTHER): Payer: Medicare Other | Admitting: Urology

## 2022-05-17 VITALS — BP 150/72 | HR 96 | Ht 67.0 in | Wt 180.0 lb

## 2022-05-17 DIAGNOSIS — Z8551 Personal history of malignant neoplasm of bladder: Secondary | ICD-10-CM | POA: Diagnosis not present

## 2022-05-17 DIAGNOSIS — D090 Carcinoma in situ of bladder: Secondary | ICD-10-CM

## 2022-05-17 DIAGNOSIS — R8289 Other abnormal findings on cytological and histological examination of urine: Secondary | ICD-10-CM

## 2022-05-17 LAB — URINALYSIS, COMPLETE
Bilirubin, UA: NEGATIVE
Glucose, UA: NEGATIVE
Ketones, UA: NEGATIVE
Nitrite, UA: NEGATIVE
Protein,UA: NEGATIVE
Specific Gravity, UA: 1.02 (ref 1.005–1.030)
Urobilinogen, Ur: 0.2 mg/dL (ref 0.2–1.0)
pH, UA: 5.5 (ref 5.0–7.5)

## 2022-05-17 LAB — MICROSCOPIC EXAMINATION: RBC, Urine: >30 /hpf — AB (ref 0–2)

## 2022-05-17 NOTE — Progress Notes (Signed)
   05/17/22 CC:  Chief Complaint  Patient presents with   Cysto    HPI: Trevor [name] is a 76 y.o. malewith a personal history of elevated PSA, BPH with LUTS, and bladder cancer, who presents today for a surveillance cystoscopy.   He was first  diagnosed in 2011, low-grade TA disease identified a gross hematuria work-up.  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  He is s/p cystoscopy, bladder biopsy, and bilateral retrograde pyelogram on 11/11/2021. 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     He completed induction BCG on 02/24/2022.   He did have some intermittent gross hematuria last week and was seen by Zara Council.  His urine culture ended up resulting negative but he was completed a course of antibiotics as a precaution.  His hematuria has resolved.  Vitals:   05/17/22 0934  BP: (!) 150/72  Pulse: 96   NED. A&Ox3.   No respiratory distress   Abd soft, NT, ND Normal phallus with bilateral descended testicles  Cystoscopy Procedure Note  Patient identification was confirmed, informed consent was obtained, and patient was prepped using Betadine solution.  Lidocaine jelly was administered per urethral meatus.     Pre-Procedure: - Inspection reveals a normal caliber ureteral meatus.  Procedure: The flexible cystoscope was introduced without difficulty - No urethral strictures/lesions are present. - Normal prostate  - Normal bladder neck - Bilateral ureteral orifices identified - Bladder mucosa  reveals no ulcers, tumors, or lesions - No bladder stones - No trabeculation   Retroflexion shows texturized carpeting around thee right UO   Post-Procedure: - Patient tolerated the procedure well  Assessment/  Plan:  History of bladder cancer  -Obvious disease although there is some textural change on the right UO, we will continue to monitor - Will plan for repeat cystoscopy in 3 months  - Urine sent for cytology ;, we discussed that the cytology is positive, would like to biopsy the specific area as outlined above in the bladder.  Otherwise, we will follow-up in 3 months with cystoscopy   3 mo cysto  I,Kailey Littlejohn,acting as a scribe for Hollice Espy, MD.,have documented all relevant documentation on the behalf of Hollice Espy, MD,as directed by  Hollice Espy, MD while in the presence of Hollice Espy, MD.  I have reviewed the above documentation for accuracy and completeness, and I agree with the above.   Hollice Espy, MD

## 2022-05-18 LAB — CYTOLOGY - NON PAP

## 2022-07-27 ENCOUNTER — Telehealth: Payer: Self-pay

## 2022-07-27 NOTE — Telephone Encounter (Signed)
Refill request from [place] at [facility]

## 2022-07-28 NOTE — Telephone Encounter (Signed)
Per patient disregard request.

## 2022-07-28 NOTE — Telephone Encounter (Signed)
Please let him know that he should get any refills of Gabapentin from his PCP.

## 2022-08-16 ENCOUNTER — Ambulatory Visit (INDEPENDENT_AMBULATORY_CARE_PROVIDER_SITE_OTHER): Payer: Medicare Other | Admitting: Urology

## 2022-08-16 ENCOUNTER — Encounter: Payer: Self-pay | Admitting: Urology

## 2022-08-16 VITALS — BP 146/77 | HR 88 | Ht 67.0 in | Wt 180.0 lb

## 2022-08-16 DIAGNOSIS — Z8551 Personal history of malignant neoplasm of bladder: Secondary | ICD-10-CM | POA: Diagnosis not present

## 2022-08-16 DIAGNOSIS — Z01812 Encounter for preprocedural laboratory examination: Secondary | ICD-10-CM

## 2022-08-16 LAB — MICROSCOPIC EXAMINATION: RBC, Urine: >30 /hpf — AB (ref 0–2)

## 2022-08-16 LAB — URINALYSIS, COMPLETE
Bilirubin, UA: NEGATIVE
Glucose, UA: NEGATIVE
Ketones, UA: NEGATIVE
Nitrite, UA: NEGATIVE
Specific Gravity, UA: 1.015 (ref 1.005–1.030)
Urobilinogen, Ur: 0.2 mg/dL (ref 0.2–1.0)
pH, UA: 6 (ref 5.0–7.5)

## 2022-08-16 NOTE — Progress Notes (Signed)
   08/16/22 CC:  Chief Complaint  Patient presents with   Cysto    HPI: BRODEE MAURITZ is a 76 y.o. malewith a personal history of elevated PSA, BPH with LUTS, and bladder cancer, who presents today for a surveillance cystoscopy.   He was first  diagnosed in 2011, low-grade TA disease identified a gross hematuria work-up.  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  He is s/p cystoscopy, bladder biopsy, and bilateral retrograde pyelogram on 11/11/2021. 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     He completed induction BCG on 02/24/2022.   He denies any significant urinary issues.  He just completed a course of antibiotics this morning for a sinus infection.  Urinalysis today with RBCs and WBCs, minimal bacteria.  Nitrite negative.  Vitals:   08/16/22 1024  BP: (!) 146/77  Pulse: 88   NED. A&Ox3.   No respiratory distress   Abd soft, NT, ND Normal phallus with bilateral descended testicles  Cystoscopy Procedure Note  Patient identification was confirmed, informed consent was obtained, and patient was prepped using Betadine solution.  Lidocaine jelly was administered per urethral meatus.     Pre-Procedure: - Inspection reveals coronal hypospadias  Procedure: The flexible cystoscope was introduced without difficulty - No urethral strictures/lesions are present. - Normal prostate  - Normal bladder neck - Bilateral ureteral orifices identified - Bladder mucosa  reveals no ulcers, tumors, or lesions - No bladder stones - No trabeculation  Retroflexion today is fairly unremarkable  Post-Procedure: - Patient tolerated the procedure well  Assessment/ Plan:  History of bladder cancer  - Will plan for repeat cystoscopy in  3-4 mo months  -Urine cytology today -Nonspecific findings at last cystoscopy not identifiable today which is reassuring -Given his history of recurrent bladder cancer as well as recent more aggressive CIS of the bladder, we discussed the option of maintenance BCG.  He would like to pursue this.  We will plan for BCG x3 in the near future.  We discussed risks again today.   Maintenance BCG then cystoscopy in 3 to 4 months   Hollice Espy, MD

## 2022-08-17 LAB — CYTOLOGY - NON PAP

## 2022-08-19 ENCOUNTER — Telehealth: Payer: Self-pay

## 2022-08-19 NOTE — Telephone Encounter (Signed)
-----   Message from Shanon Ace, [place] sent at 08/16/2022  1:38 PM EDT ----- Regarding: BCG Maint BCG asap per Dr. Erlene Quan

## 2022-08-19 NOTE — Telephone Encounter (Signed)
Attempted to schedule pt, he states he will call back next week to schedule.

## 2022-08-22 ENCOUNTER — Other Ambulatory Visit: Payer: Self-pay

## 2022-08-22 NOTE — Telephone Encounter (Signed)
Pt scheduled for 3 mBCG appts, pt confirmed.

## 2022-08-23 ENCOUNTER — Ambulatory Visit: Payer: Medicare Other | Admitting: Urology

## 2022-08-23 NOTE — Progress Notes (Unsigned)
BCG Bladder Instillation  We did not perform a BCG today as patient is having gross heme, malaise and urine with 6-10 WBCs greater than 30 RBCs and a few bacteria.  Patient denies any modifying or aggravating factors.  Patient denies any dysuria or suprapubic/flank pain.  Patient denies any fevers, chills, nausea or vomiting.    Urine is sent for culture and hopefully will at least get a preliminary by Friday, so we can start an antibiotic.

## 2022-08-24 ENCOUNTER — Ambulatory Visit (INDEPENDENT_AMBULATORY_CARE_PROVIDER_SITE_OTHER): Payer: Medicare Other | Admitting: Urology

## 2022-08-24 DIAGNOSIS — R310 Gross hematuria: Secondary | ICD-10-CM

## 2022-08-24 DIAGNOSIS — R5381 Other malaise: Secondary | ICD-10-CM

## 2022-08-24 DIAGNOSIS — R8271 Bacteriuria: Secondary | ICD-10-CM

## 2022-08-24 DIAGNOSIS — D090 Carcinoma in situ of bladder: Secondary | ICD-10-CM

## 2022-08-24 LAB — URINALYSIS, COMPLETE
Bilirubin, UA: NEGATIVE
Glucose, UA: NEGATIVE
Ketones, UA: NEGATIVE
Nitrite, UA: NEGATIVE
Specific Gravity, UA: 1.01 (ref 1.005–1.030)
Urobilinogen, Ur: 0.2 mg/dL (ref 0.2–1.0)
pH, UA: 5.5 (ref 5.0–7.5)

## 2022-08-24 LAB — MICROSCOPIC EXAMINATION: RBC, Urine: >30 /HPF — AB (ref 0–2)

## 2022-08-26 LAB — CULTURE, URINE COMPREHENSIVE

## 2022-08-29 NOTE — Progress Notes (Unsigned)
BCG Bladder Instillation  BCG # Not given today.   He continues to experience episodes of gross hematuria and on today's urinalysis it is amber clear, 6-10 WBCs and greater than 30 RBCs.  We will go ahead and repeat culture at this time.  If it returns negative, we may need to consider upper tract imaging for further evaluation.

## 2022-08-30 ENCOUNTER — Ambulatory Visit: Payer: Medicare Other | Admitting: Urology

## 2022-08-30 ENCOUNTER — Ambulatory Visit (INDEPENDENT_AMBULATORY_CARE_PROVIDER_SITE_OTHER): Payer: Medicare Other | Admitting: Urology

## 2022-08-30 DIAGNOSIS — D090 Carcinoma in situ of bladder: Secondary | ICD-10-CM

## 2022-08-30 DIAGNOSIS — R310 Gross hematuria: Secondary | ICD-10-CM

## 2022-08-30 LAB — URINALYSIS, COMPLETE
Bilirubin, UA: NEGATIVE
Glucose, UA: NEGATIVE
Ketones, UA: NEGATIVE
Nitrite, UA: NEGATIVE
Specific Gravity, UA: 1.025 (ref 1.005–1.030)
Urobilinogen, Ur: 0.2 mg/dL (ref 0.2–1.0)
pH, UA: 5 (ref 5.0–7.5)

## 2022-08-30 LAB — MICROSCOPIC EXAMINATION
Bacteria, UA: NONE SEEN
RBC, Urine: >30 /hpf — AB (ref 0–2)

## 2022-09-06 ENCOUNTER — Ambulatory Visit (INDEPENDENT_AMBULATORY_CARE_PROVIDER_SITE_OTHER): Payer: Medicare Other | Admitting: Physician Assistant

## 2022-09-06 ENCOUNTER — Ambulatory Visit: Payer: Medicare Other | Admitting: Urology

## 2022-09-06 DIAGNOSIS — C679 Malignant neoplasm of bladder, unspecified: Secondary | ICD-10-CM | POA: Diagnosis not present

## 2022-09-06 DIAGNOSIS — N329 Bladder disorder, unspecified: Secondary | ICD-10-CM

## 2022-09-06 LAB — URINALYSIS, COMPLETE
Bilirubin, UA: NEGATIVE
Glucose, UA: NEGATIVE
Ketones, UA: NEGATIVE
Leukocytes,UA: NEGATIVE
Nitrite, UA: NEGATIVE
Specific Gravity, UA: 1.01 (ref 1.005–1.030)
Urobilinogen, Ur: 0.2 mg/dL (ref 0.2–1.0)
pH, UA: 5 (ref 5.0–7.5)

## 2022-09-06 LAB — MICROSCOPIC EXAMINATION
Bacteria, UA: NONE SEEN
RBC, Urine: >30 /hpf — AB (ref 0–2)

## 2022-09-06 MED ORDER — BCG LIVE 50 MG IS SUSR
3.2400 mL | Freq: Once | INTRAVESICAL | Status: AC
  Administered 2022-09-06: 81 mg via INTRAVESICAL

## 2022-09-06 NOTE — Patient Instructions (Signed)
Your Timeline for Today:  Right now through 5:35pm: Hold your urine and do your quarter turns every 15 minutes. 5:35pm-11:35pm today: Every time you urinate, pour 1/2 cup of bleach into the toilet and let it sit for 15 minutes prior to flushing. 11:35pm onward: Resume your normal routine.   Patient Education: (BCG) Into the Bladder (Intravesical Chemotherapy)  BCG is a vaccine which is used to prevent tuberculosis (TB).  But it's also a helpful treatment for some early bladder cancers.  When BCG goes directly into the bladder the treatment is described as intravesical.  BCG is a type of immunotherapy.  Immunotherapy stimulates the body's immune system to destroy cancer cells.  How it's given BCG treatment is given to you in an outpatient setting.  It takes a few minutes to administer and you can go home as soon as it's finished.  It might be a good idea to ask someone to bring you, particularly the fist time.  Unlike chemotherapy into the bladder, BCG treatment is never given immediately after surgery to remove bladder tumors.  There needs to be a delay usually of at least two weeks after surgery, before you can have it.  You won't be given treatment with BCG if you are unwell or have an infection in your urine.  You're usually asked to limit the amount you drink before your treatment.  This will help to increase the concentration of BCG in your bladder.  Drinking too much before your treatment may make your bladder feel uncomfortably full.  If you normally take water tablets (diuretics) take them later in the day after your treatment.  Your nurse or doctor will give you more advise about preparing for your treatment.  You will have a small tube (catheter) placed into your bladder.  Your doctor will then put the liquid vaccine directly into your bladder through the catheter and remove the catheter.  You will need to hold your urine for two hours afterwards.  Rotating every 15 minutes from side to  side. This can be difficult but it's to give the treatment time to work.  When the treatment is over you can go to the toilet.  After your treatment there are some precautions you'll need to take.  This is because BCG is a live vaccine and other people shouldn't be exposed to it.  For the next six hours, you'll need to avoid your urine splashing on the toilet seat and getting any urine on your hands.  It might be easer for men to sit down when they're using an ordinary toilet although using a stand up urinal should be alright.  The main this is to avoid splashing urine and spreading the vaccine.  You will also be asked to put 1/2 cup undiluted bleach into the toilet to destroy any live vaccine and leave it for 15 minutes until you flush for the next 6 voids.  Side Effects Because BCG goes directly into the bladder most of the side effects are linked with the bladder.  They usually go away within one to two days after your treatment.  The most common ones are: -needing to pass urine often -pain when you pass urine -blood in urine -flu-like symptoms (tiredness, general aching and raised temperature)  Theses side effects should settle down within a day or two.  If they don't get better contact your doctor.  Drinking lots of fluids can help flush the drug out of your bladder and reduce some of these effects.  Taking Ibuprofen or Aleve is encouraged unless you have a condition that would make these medications unsafe to take (renal failure, diabetes, gerd)  Rare side effects can include a continuing high temperature (fever), pain in your joints and a cough.  If you have any of these symptoms, or if you feel generally unwell, contact your doctor.  These symptoms could be a sign of a more serious infection (due to BCG) that needs to be treated immediately.  If this happens you'll be treated with the same drugs (antibiotics) that are used to treat TB.  Contraception Men should use a condom during sex for  the first 48 hours after their treatment.  If you are a women who has had BCG treatment then your partner should use a condom.  Using a condom will protect your partner from any vaccine present in your semen or vaginal fluid.  We don't know how BCG may affect a developing fetus so it's not advisable to become pregnant or father a child while having it.  It is important to use effective contraception during your treatment and for six weeks afterwards.  You can discuss this with your doctor or specialist nurse.

## 2022-09-06 NOTE — Progress Notes (Signed)
BCG Bladder Instillation  BCG # 1 of 3  Due to Bladder Cancer patient is present today for a BCG treatment. Patient was cleaned and prepped in a sterile fashion with betadine. A 14FR catheter was inserted, urine return was noted 145m, urine was pink tinged in color.  555mof reconstituted BCG was instilled into the bladder. The catheter was then removed. Patient tolerated well, no complications were noted  Performed by: SaDebroah LoopPA-C and JeGaspar ColaCMA  Follow up/ Additional notes: 1 week for BCG #2 of 3

## 2022-09-07 LAB — CULTURE, URINE COMPREHENSIVE

## 2022-09-08 ENCOUNTER — Telehealth: Payer: Self-pay

## 2022-09-08 MED ORDER — NITROFURANTOIN MONOHYD MACRO 100 MG PO CAPS: 100.0000 mg | ORAL_CAPSULE | Freq: Two times a day (BID) | ORAL | 0 refills | Status: AC

## 2022-09-08 NOTE — Telephone Encounter (Signed)
-----   Message from Nori Riis, PA-C sent at 09/08/2022  2:17 PM EDT ----- Please let [name] know that his urine culture grew out a tiny amount of bacteria and I would like for him to take a three day course of Macrobid 100 mg twice daily as a precaution before his next BCG treatment.

## 2022-09-08 NOTE — Telephone Encounter (Signed)
Notified pt as advised, abx Rx sent to pharmacy. Pt does note that since his BCG treatment on Tuesday he has seen a lot more blood than normal. He states it is heavier early in the morning,  then it will clear up and then get heavy again as the day progresses. He states it has been more than it usually is post BCG treatment. Advised pt I would let provider know.

## 2022-09-08 NOTE — Telephone Encounter (Signed)
Pt states he started noticing blood in his urine on Tuesday night. He notes that it is heaviest in the morning as if it accumulates over night.

## 2022-09-11 DIAGNOSIS — I4729 Other ventricular tachycardia: Secondary | ICD-10-CM

## 2022-09-11 HISTORY — DX: Other ventricular tachycardia: I47.29

## 2022-09-13 ENCOUNTER — Ambulatory Visit (INDEPENDENT_AMBULATORY_CARE_PROVIDER_SITE_OTHER): Payer: Medicare Other | Admitting: Physician Assistant

## 2022-09-13 DIAGNOSIS — R310 Gross hematuria: Secondary | ICD-10-CM

## 2022-09-13 DIAGNOSIS — C679 Malignant neoplasm of bladder, unspecified: Secondary | ICD-10-CM | POA: Diagnosis not present

## 2022-09-13 DIAGNOSIS — D090 Carcinoma in situ of bladder: Secondary | ICD-10-CM

## 2022-09-13 LAB — URINALYSIS, COMPLETE
Bilirubin, UA: NEGATIVE
Glucose, UA: NEGATIVE
Ketones, UA: NEGATIVE
Nitrite, UA: NEGATIVE
Protein,UA: NEGATIVE
Specific Gravity, UA: <1.005 — ABNORMAL LOW (ref 1.005–1.030)
Urobilinogen, Ur: 0.2 mg/dL (ref 0.2–1.0)
pH, UA: 5 (ref 5.0–7.5)

## 2022-09-13 LAB — MICROSCOPIC EXAMINATION: RBC, Urine: >30 /hpf — AB (ref 0–2)

## 2022-09-13 MED ORDER — BCG LIVE 50 MG IS SUSR
3.2400 mL | Freq: Once | INTRAVESICAL | Status: AC
  Administered 2022-09-13: 81 mg via INTRAVESICAL

## 2022-09-13 NOTE — Progress Notes (Signed)
BCG Bladder Instillation  BCG # 2 of 3  Due to Bladder Cancer patient is present today for a BCG treatment. Patient was cleaned and prepped in a sterile fashion with betadine. A 14FR catheter was inserted, urine return was noted 181m, urine was yellow in color.  521mof reconstituted BCG was instilled into the bladder. The catheter was then removed. Patient tolerated well, no complications were noted  Performed by: SaDebroah LoopPA-C and CaElberta LeatherwoodCMA  Follow up/ Additional notes: 1 week for BCG #3 of 3

## 2022-09-16 ENCOUNTER — Other Ambulatory Visit: Payer: Self-pay | Admitting: *Deleted

## 2022-09-16 LAB — CULTURE, URINE COMPREHENSIVE

## 2022-09-16 MED ORDER — NITROFURANTOIN MONOHYD MACRO 100 MG PO CAPS: 100.0000 mg | ORAL_CAPSULE | Freq: Two times a day (BID) | ORAL | 0 refills | Status: DC

## 2022-09-19 NOTE — Progress Notes (Deleted)
BCG Bladder Instillation  BCG # 3/3  Due to Bladder Cancer patient is present today for a BCG treatment. Patient was cleaned and prepped in a sterile fashion with betadine. A ***FR catheter was inserted, urine return was noted ***ml, urine was *** in color.  63m of reconstituted BCG was instilled into the bladder. The catheter was then removed. Patient tolerated well, {dnt complications:20057}  Performed by: ***  Follow up/ Additional notes: RTC 12/20/2022 for surveillance cystoscopy

## 2022-09-20 ENCOUNTER — Ambulatory Visit: Payer: Medicare Other | Admitting: Urology

## 2022-09-20 DIAGNOSIS — D090 Carcinoma in situ of bladder: Secondary | ICD-10-CM

## 2022-09-22 ENCOUNTER — Emergency Department: Payer: Medicare Other

## 2022-09-22 ENCOUNTER — Inpatient Hospital Stay
Admission: EM | Admit: 2022-09-22 | Discharge: 2022-09-25 | DRG: 871 | Disposition: A | Discharge status: Home / Self Care | Payer: Medicare Other | Attending: Osteopathic Medicine | Admitting: Osteopathic Medicine

## 2022-09-22 ENCOUNTER — Other Ambulatory Visit: Payer: Self-pay

## 2022-09-22 ENCOUNTER — Encounter: Payer: Self-pay | Admitting: Emergency Medicine

## 2022-09-22 DIAGNOSIS — N390 Urinary tract infection, site not specified: Secondary | ICD-10-CM | POA: Diagnosis present

## 2022-09-22 DIAGNOSIS — E876 Hypokalemia: Secondary | ICD-10-CM | POA: Diagnosis present

## 2022-09-22 DIAGNOSIS — K2970 Gastritis, unspecified, without bleeding: Secondary | ICD-10-CM | POA: Diagnosis present

## 2022-09-22 DIAGNOSIS — T378X5A Adverse effect of other specified systemic anti-infectives and antiparasitics, initial encounter: Secondary | ICD-10-CM | POA: Diagnosis present

## 2022-09-22 DIAGNOSIS — Z20822 Contact with and (suspected) exposure to covid-19: Secondary | ICD-10-CM | POA: Diagnosis present

## 2022-09-22 DIAGNOSIS — A4181 Sepsis due to Enterococcus: Secondary | ICD-10-CM | POA: Diagnosis present

## 2022-09-22 DIAGNOSIS — T502X5A Adverse effect of carbonic-anhydrase inhibitors, benzothiadiazides and other diuretics, initial encounter: Secondary | ICD-10-CM | POA: Diagnosis present

## 2022-09-22 DIAGNOSIS — I10 Essential (primary) hypertension: Secondary | ICD-10-CM | POA: Diagnosis present

## 2022-09-22 DIAGNOSIS — K219 Gastro-esophageal reflux disease without esophagitis: Secondary | ICD-10-CM | POA: Diagnosis present

## 2022-09-22 DIAGNOSIS — F32A Depression, unspecified: Secondary | ICD-10-CM | POA: Diagnosis present

## 2022-09-22 DIAGNOSIS — N179 Acute kidney failure, unspecified: Secondary | ICD-10-CM | POA: Diagnosis present

## 2022-09-22 DIAGNOSIS — Z8249 Family history of ischemic heart disease and other diseases of the circulatory system: Secondary | ICD-10-CM | POA: Diagnosis not present

## 2022-09-22 DIAGNOSIS — N401 Enlarged prostate with lower urinary tract symptoms: Secondary | ICD-10-CM | POA: Diagnosis present

## 2022-09-22 DIAGNOSIS — Z66 Do not resuscitate: Secondary | ICD-10-CM | POA: Diagnosis present

## 2022-09-22 DIAGNOSIS — I472 Ventricular tachycardia, unspecified: Secondary | ICD-10-CM | POA: Diagnosis not present

## 2022-09-22 DIAGNOSIS — Z7982 Long term (current) use of aspirin: Secondary | ICD-10-CM | POA: Diagnosis not present

## 2022-09-22 DIAGNOSIS — Z8551 Personal history of malignant neoplasm of bladder: Secondary | ICD-10-CM | POA: Diagnosis not present

## 2022-09-22 DIAGNOSIS — E785 Hyperlipidemia, unspecified: Secondary | ICD-10-CM | POA: Diagnosis present

## 2022-09-22 DIAGNOSIS — A419 Sepsis, unspecified organism: Secondary | ICD-10-CM | POA: Diagnosis not present

## 2022-09-22 DIAGNOSIS — E872 Acidosis, unspecified: Secondary | ICD-10-CM | POA: Diagnosis present

## 2022-09-22 DIAGNOSIS — T886XXA Anaphylactic reaction due to adverse effect of correct drug or medicament properly administered, initial encounter: Secondary | ICD-10-CM | POA: Diagnosis present

## 2022-09-22 DIAGNOSIS — C679 Malignant neoplasm of bladder, unspecified: Secondary | ICD-10-CM

## 2022-09-22 DIAGNOSIS — R579 Shock, unspecified: Principal | ICD-10-CM

## 2022-09-22 DIAGNOSIS — N170 Acute kidney failure with tubular necrosis: Secondary | ICD-10-CM | POA: Diagnosis present

## 2022-09-22 DIAGNOSIS — C672 Malignant neoplasm of lateral wall of bladder: Secondary | ICD-10-CM | POA: Diagnosis present

## 2022-09-22 DIAGNOSIS — Z79899 Other long term (current) drug therapy: Secondary | ICD-10-CM

## 2022-09-22 DIAGNOSIS — R531 Weakness: Secondary | ICD-10-CM | POA: Diagnosis present

## 2022-09-22 LAB — COMPREHENSIVE METABOLIC PANEL
ALT: 26 U/L (ref 0–44)
AST: 42 U/L — ABNORMAL HIGH (ref 15–41)
Albumin: 3.4 g/dL — ABNORMAL LOW (ref 3.5–5.0)
Alkaline Phosphatase: 67 U/L (ref 38–126)
Anion gap: 16 — ABNORMAL HIGH (ref 5–15)
BUN: 47 mg/dL — ABNORMAL HIGH (ref 8–23)
CO2: 20 mmol/L — ABNORMAL LOW (ref 22–32)
Calcium: 8.5 mg/dL — ABNORMAL LOW (ref 8.9–10.3)
Chloride: 101 mmol/L (ref 98–111)
Creatinine, Ser: 2.47 mg/dL — ABNORMAL HIGH (ref 0.61–1.24)
GFR, Estimated: 26 mL/min — ABNORMAL LOW (ref >60)
Glucose, Bld: 149 mg/dL — ABNORMAL HIGH (ref 70–99)
Potassium: 3.2 mmol/L — ABNORMAL LOW (ref 3.5–5.1)
Sodium: 137 mmol/L (ref 135–145)
Total Bilirubin: 1 mg/dL (ref 0.3–1.2)
Total Protein: 6.8 g/dL (ref 6.5–8.1)

## 2022-09-22 LAB — CBC WITH DIFFERENTIAL/PLATELET
Abs Immature Granulocytes: 0.35 10*3/uL — ABNORMAL HIGH (ref 0.00–0.07)
Basophils Absolute: 0.1 10*3/uL (ref 0.0–0.1)
Basophils Relative: 0 %
Eosinophils Absolute: 0.6 10*3/uL — ABNORMAL HIGH (ref 0.0–0.5)
Eosinophils Relative: 3 %
HCT: 37.4 % — ABNORMAL LOW (ref 39.0–52.0)
Hemoglobin: 12.4 g/dL — ABNORMAL LOW (ref 13.0–17.0)
Immature Granulocytes: 2 %
Lymphocytes Relative: 1 %
Lymphs Abs: 0.2 10*3/uL — ABNORMAL LOW (ref 0.7–4.0)
MCH: 28.6 pg (ref 26.0–34.0)
MCHC: 33.2 g/dL (ref 30.0–36.0)
MCV: 86.2 fL (ref 80.0–100.0)
Monocytes Absolute: 0.8 10*3/uL (ref 0.1–1.0)
Monocytes Relative: 4 %
Neutro Abs: 16.8 10*3/uL — ABNORMAL HIGH (ref 1.7–7.7)
Neutrophils Relative %: 90 %
Platelets: 141 10*3/uL — ABNORMAL LOW (ref 150–400)
RBC: 4.34 MIL/uL (ref 4.22–5.81)
RDW: 12.9 % (ref 11.5–15.5)
WBC: 18.9 10*3/uL — ABNORMAL HIGH (ref 4.0–10.5)
nRBC: 0 % (ref 0.0–0.2)

## 2022-09-22 LAB — LACTIC ACID, PLASMA
Lactic Acid, Venous: 5.2 mmol/L (ref 0.5–1.9)
Lactic Acid, Venous: 5 mmol/L (ref 0.5–1.9)
Lactic Acid, Venous: 3.2 mmol/L (ref 0.5–1.9)

## 2022-09-22 LAB — MAGNESIUM: Magnesium: 1.9 mg/dL (ref 1.7–2.4)

## 2022-09-22 LAB — URINALYSIS, ROUTINE W REFLEX MICROSCOPIC
Bilirubin Urine: NEGATIVE
Glucose, UA: NEGATIVE mg/dL
Ketones, ur: NEGATIVE mg/dL
Leukocytes,Ua: NEGATIVE
Nitrite: NEGATIVE
Protein, ur: NEGATIVE mg/dL
RBC / HPF: >50 RBC/hpf — ABNORMAL HIGH (ref 0–5)
Specific Gravity, Urine: 1.013 (ref 1.005–1.030)
pH: 5 (ref 5.0–8.0)

## 2022-09-22 LAB — RESP PANEL BY RT-PCR (FLU A&B, COVID) ARPGX2
Influenza A by PCR: NEGATIVE
Influenza B by PCR: NEGATIVE
SARS Coronavirus 2 by RT PCR: NEGATIVE

## 2022-09-22 LAB — TROPONIN I (HIGH SENSITIVITY)
Troponin I (High Sensitivity): 10 ng/L (ref <18)
Troponin I (High Sensitivity): 8 ng/L (ref <18)

## 2022-09-22 LAB — BRAIN NATRIURETIC PEPTIDE: B Natriuretic Peptide: 57.4 pg/mL (ref 0.0–100.0)

## 2022-09-22 LAB — LIPASE, BLOOD: Lipase: 68 U/L — ABNORMAL HIGH (ref 11–51)

## 2022-09-22 MED ORDER — EPINEPHRINE 0.3 MG/0.3ML IJ SOAJ
0.3000 mg | Freq: Once | INTRAMUSCULAR | Status: AC
  Administered 2022-09-22: 0.3 mg via INTRAMUSCULAR
  Filled 2022-09-22: qty 0.3

## 2022-09-22 MED ORDER — PIPERACILLIN-TAZOBACTAM 3.375 G IVPB
3.3750 g | Freq: Three times a day (TID) | INTRAVENOUS | Status: DC
  Administered 2022-09-22 – 2022-09-25 (×9): 3.375 g via INTRAVENOUS
  Filled 2022-09-22 (×9): qty 50

## 2022-09-22 MED ORDER — METHYLPREDNISOLONE SODIUM SUCC 125 MG IJ SOLR
125.0000 mg | INTRAMUSCULAR | Status: AC
  Administered 2022-09-22: 125 mg via INTRAVENOUS
  Filled 2022-09-22: qty 2

## 2022-09-22 MED ORDER — PANTOPRAZOLE SODIUM 40 MG PO TBEC
40.0000 mg | DELAYED_RELEASE_TABLET | Freq: Every day | ORAL | Status: DC
  Administered 2022-09-22 – 2022-09-25 (×4): 40 mg via ORAL
  Filled 2022-09-22 (×4): qty 1

## 2022-09-22 MED ORDER — SODIUM CHLORIDE 0.9 % IV BOLUS
500.0000 mL | Freq: Once | INTRAVENOUS | Status: AC
  Administered 2022-09-22: 500 mL via INTRAVENOUS

## 2022-09-22 MED ORDER — VANCOMYCIN HCL 1750 MG/350ML IV SOLN
1750.0000 mg | Freq: Once | INTRAVENOUS | Status: AC
  Administered 2022-09-22: 1750 mg via INTRAVENOUS
  Filled 2022-09-22: qty 350

## 2022-09-22 MED ORDER — SIMVASTATIN 20 MG PO TABS
40.0000 mg | ORAL_TABLET | Freq: Every day | ORAL | Status: DC
  Administered 2022-09-22 – 2022-09-24 (×3): 40 mg via ORAL
  Filled 2022-09-22: qty 4
  Filled 2022-09-22 (×2): qty 2

## 2022-09-22 MED ORDER — ACETAMINOPHEN 500 MG PO TABS: 1000.0000 mg | ORAL_TABLET | Freq: Three times a day (TID) | ORAL | Status: DC | PRN

## 2022-09-22 MED ORDER — ONDANSETRON HCL 4 MG/2ML IJ SOLN: 4.0000 mg | Freq: Four times a day (QID) | INTRAMUSCULAR | Status: DC | PRN

## 2022-09-22 MED ORDER — TRAZODONE HCL 100 MG PO TABS
100.0000 mg | ORAL_TABLET | Freq: Every day | ORAL | Status: DC
  Administered 2022-09-22 – 2022-09-24 (×3): 100 mg via ORAL
  Filled 2022-09-22 (×3): qty 1

## 2022-09-22 MED ORDER — DIPHENHYDRAMINE HCL 50 MG/ML IJ SOLN
50.0000 mg | Freq: Once | INTRAMUSCULAR | Status: AC
  Administered 2022-09-22: 50 mg via INTRAVENOUS
  Filled 2022-09-22: qty 1

## 2022-09-22 MED ORDER — POTASSIUM CHLORIDE CRYS ER 20 MEQ PO TBCR
40.0000 meq | EXTENDED_RELEASE_TABLET | Freq: Once | ORAL | Status: AC
  Administered 2022-09-22: 40 meq via ORAL
  Filled 2022-09-22: qty 2

## 2022-09-22 MED ORDER — SODIUM CHLORIDE 0.9 % IV SOLN
2.0000 g | Freq: Once | INTRAVENOUS | Status: AC
  Administered 2022-09-22: 2 g via INTRAVENOUS
  Filled 2022-09-22: qty 10

## 2022-09-22 MED ORDER — SENNOSIDES-DOCUSATE SODIUM 8.6-50 MG PO TABS
2.0000 | ORAL_TABLET | Freq: Every day | ORAL | Status: DC
  Administered 2022-09-22 – 2022-09-23 (×2): 2 via ORAL
  Filled 2022-09-22 (×3): qty 2

## 2022-09-22 MED ORDER — ASPIRIN 81 MG PO TBEC
81.0000 mg | DELAYED_RELEASE_TABLET | Freq: Every day | ORAL | Status: DC
  Administered 2022-09-22 – 2022-09-25 (×4): 81 mg via ORAL
  Filled 2022-09-22 (×4): qty 1

## 2022-09-22 MED ORDER — MIRTAZAPINE 15 MG PO TABS
15.0000 mg | ORAL_TABLET | Freq: Every day | ORAL | Status: DC
  Administered 2022-09-22 – 2022-09-24 (×3): 15 mg via ORAL
  Filled 2022-09-22 (×3): qty 1

## 2022-09-22 MED ORDER — ONDANSETRON HCL 4 MG PO TABS: 4.0000 mg | ORAL_TABLET | Freq: Four times a day (QID) | ORAL | Status: DC | PRN

## 2022-09-22 MED ORDER — LACTATED RINGERS IV BOLUS
1000.0000 mL | Freq: Once | INTRAVENOUS | Status: AC
  Administered 2022-09-22: 1000 mL via INTRAVENOUS

## 2022-09-22 MED ORDER — ENOXAPARIN SODIUM 30 MG/0.3ML IJ SOSY
30.0000 mg | PREFILLED_SYRINGE | INTRAMUSCULAR | Status: DC
  Administered 2022-09-22: 30 mg via SUBCUTANEOUS
  Filled 2022-09-22: qty 0.3

## 2022-09-22 MED ORDER — ADULT MULTIVITAMIN W/MINERALS CH
1.0000 | ORAL_TABLET | Freq: Every day | ORAL | Status: DC
  Administered 2022-09-22 – 2022-09-25 (×4): 1 via ORAL
  Filled 2022-09-22 (×4): qty 1

## 2022-09-22 MED ORDER — METRONIDAZOLE 500 MG/100ML IV SOLN
500.0000 mg | Freq: Once | INTRAVENOUS | Status: AC
  Administered 2022-09-22: 500 mg via INTRAVENOUS
  Filled 2022-09-22: qty 100

## 2022-09-22 MED ORDER — VITAMIN B-12 1000 MCG PO TABS
500.0000 ug | ORAL_TABLET | Freq: Every day | ORAL | Status: DC
  Administered 2022-09-22 – 2022-09-25 (×4): 500 ug via ORAL
  Filled 2022-09-22 (×4): qty 1

## 2022-09-22 MED ORDER — VANCOMYCIN HCL IN DEXTROSE 1-5 GM/200ML-% IV SOLN: 1000.0000 mg | Freq: Once | INTRAVENOUS | Status: DC

## 2022-09-22 MED ORDER — GABAPENTIN 300 MG PO CAPS
300.0000 mg | ORAL_CAPSULE | Freq: Two times a day (BID) | ORAL | Status: DC
  Administered 2022-09-23 – 2022-09-25 (×5): 300 mg via ORAL
  Filled 2022-09-22 (×6): qty 1

## 2022-09-22 MED ORDER — SODIUM CHLORIDE 0.9 % IV BOLUS (SEPSIS)
1000.0000 mL | Freq: Once | INTRAVENOUS | Status: AC
  Administered 2022-09-22: 1000 mL via INTRAVENOUS

## 2022-09-22 MED ORDER — TAMSULOSIN HCL 0.4 MG PO CAPS
0.4000 mg | ORAL_CAPSULE | Freq: Every day | ORAL | Status: DC
  Administered 2022-09-22 – 2022-09-25 (×4): 0.4 mg via ORAL
  Filled 2022-09-22 (×4): qty 1

## 2022-09-22 MED ORDER — ONDANSETRON HCL 4 MG/2ML IJ SOLN
4.0000 mg | Freq: Once | INTRAMUSCULAR | Status: DC
  Filled 2022-09-22: qty 2

## 2022-09-22 MED ORDER — LACTATED RINGERS IV SOLN: INTRAVENOUS | Status: DC

## 2022-09-22 NOTE — Assessment & Plan Note (Signed)
Most likely related to diuretic use Supplement potassium Magnesium level is within normal limits

## 2022-09-22 NOTE — ED Provider Notes (Signed)
[facility] Provider Note    Event Date/Time   First MD Initiated Contact with Patient 09/22/22 [phone]     (approximate)   History   Chief Complaint: Weakness   HPI  Trevor [name] is a 76 y.o. male with a history of of GERD, hypertension, BPH, bladder cancer status post bladder surgery and intravesical chemotherapy who comes the ED complaining of generalized weakness for the past 5 days.  Reports that he was recently started on Macrobid for a UTI.  He took his first dose 6 days ago, and then the next day he had generalized abdominal pain, low energy, nausea and has not been able to eat or drink since.  Yesterday he took 2 more doses of Macrobid, and today he feels very weak and lightheaded.  He has had poor oral intake over the last 5 days.  Other than abdominal pain, no other pain complaints.  No leg pain or swelling.  No chest pain or shortness of breath.  He does have occasional cough.     Physical Exam   Triage Vital Signs: ED Triage Vitals  Enc Vitals Group     BP 09/22/22 0743 (!) 74/50     Pulse Rate 09/22/22 0743 (!) 114     Resp 09/22/22 0743 16     Temp 09/22/22 0743 98.7 F (37.1 C)     Temp Source 09/22/22 0743 Oral     SpO2 09/22/22 0743 94 %     Weight 09/22/22 0744 179 lb 14.3 oz (81.6 kg)     Height 09/22/22 0744 '5\' 7"'$  (1.702 m)     Head Circumference --      Peak Flow --      Pain Score 09/22/22 0744 0     Pain Loc --      Pain Edu? --      Excl. in [place]? --     Most recent vital signs: Vitals:   09/22/22 1105 09/22/22 1214  BP: (!) 112/57 122/68  Pulse: 87 81  Resp: 18 18  Temp:  98.1 F (36.7 C)  SpO2: 96% 96%    General: Awake, no distress.  CV:  Good peripheral perfusion.  Tachycardia heart rate 110.  Regular rate. Resp:  Normal effort.  Clear to auscultation bilaterally.  Inducible wheezing with cough/FEV1 maneuver Abd:  No distention.  Soft with mild lower abdominal tenderness Other:  No lower extremity edema or  calf tenderness.  Dry mucous membranes.  There are petechia on bilateral lower legs which are new according to the patient.  There is erythematous rash on bilateral arms which is also new.  No purpura or bullae.   ED Results / Procedures / Treatments   Labs (all labs ordered are listed, but only abnormal results are displayed) Labs Reviewed  COMPREHENSIVE METABOLIC PANEL - Abnormal; Notable for the following components:      Result Value   Potassium 3.2 (*)    CO2 20 (*)    Glucose, Bld 149 (*)    BUN 47 (*)    Creatinine, Ser 2.47 (*)    Calcium 8.5 (*)    Albumin 3.4 (*)    AST 42 (*)    GFR, Estimated 26 (*)    Anion gap 16 (*)    All other components within normal limits  LACTIC ACID, PLASMA - Abnormal; Notable for the following components:   Lactic Acid, Venous 5.2 (*)    All other components within normal limits  LACTIC ACID, PLASMA - Abnormal; Notable for the following components:   Lactic Acid, Venous 5.0 (*)    All other components within normal limits  CBC WITH DIFFERENTIAL/PLATELET - Abnormal; Notable for the following components:   WBC 18.9 (*)    Hemoglobin 12.4 (*)    HCT 37.4 (*)    Platelets 141 (*)    Neutro Abs 16.8 (*)    Lymphs Abs 0.2 (*)    Eosinophils Absolute 0.6 (*)    Abs Immature Granulocytes 0.35 (*)    All other components within normal limits  URINALYSIS, ROUTINE W REFLEX MICROSCOPIC - Abnormal; Notable for the following components:   Color, Urine YELLOW (*)    APPearance HAZY (*)    Hgb urine dipstick LARGE (*)    RBC / HPF >50 (*)    Bacteria, UA FEW (*)    All other components within normal limits  LIPASE, BLOOD - Abnormal; Notable for the following components:   Lipase 68 (*)    All other components within normal limits  RESP PANEL BY RT-PCR (FLU A&B, COVID) ARPGX2  CULTURE, BLOOD (SINGLE)  BRAIN NATRIURETIC PEPTIDE  MAGNESIUM  TROPONIN I (HIGH SENSITIVITY)  TROPONIN I (HIGH SENSITIVITY)     EKG Interpreted by me Sinus  tachycardia rate 112.  Left axis, normal intervals.  Normal QRS ST segments and T waves.   RADIOLOGY Chest x-ray interpreted by me, appears normal without effusion or pneumothorax or consolidation.  Radiology report reviewed.  CT chest abdomen pelvis pending   PROCEDURES:  .Critical Care  Performed by: Carrie Mew, MD Authorized by: Carrie Mew, MD   Critical care provider statement:    Critical care time (minutes):  35   Critical care time was exclusive of:  Separately billable procedures and treating other patients   Critical care was necessary to treat or prevent imminent or life-threatening deterioration of the following conditions:  Shock and dehydration   Critical care was time spent personally by me on the following activities:  Development of treatment plan with patient or surrogate, discussions with consultants, evaluation of patient's response to treatment, examination of patient, obtaining history from patient or surrogate, ordering and performing treatments and interventions, ordering and review of laboratory studies, ordering and review of radiographic studies, pulse oximetry, re-evaluation of patient's condition and review of old charts   Care discussed with: admitting provider   Comments:        .1-3 Lead EKG Interpretation  Performed by: Carrie Mew, MD Authorized by: Carrie Mew, MD     Interpretation: abnormal     ECG rate:  110   ECG rate assessment: tachycardic     Rhythm: sinus tachycardia     Ectopy: none     Conduction: normal      MEDICATIONS ORDERED IN ED: Medications  ondansetron (ZOFRAN) injection 4 mg (0 mg Intravenous Hold 09/22/22 0836)  vancomycin (VANCOREADY) IVPB 1750 mg/350 mL (1,750 mg Intravenous New Bag/Given 09/22/22 1137)  sodium chloride 0.9 % bolus 500 mL (0 mLs Intravenous Stopped 09/22/22 0907)  methylPREDNISolone sodium succinate (SOLU-MEDROL) 125 mg/2 mL injection 125 mg (125 mg Intravenous Given 09/22/22  0834)  diphenhydrAMINE (BENADRYL) injection 50 mg (50 mg Intravenous Given 09/22/22 0834)  EPINEPHrine (EPI-PEN) injection 0.3 mg (0.3 mg Intramuscular Given 09/22/22 0835)  lactated ringers bolus 1,000 mL (0 mLs Intravenous Stopped 09/22/22 1035)  sodium chloride 0.9 % bolus 1,000 mL (0 mLs Intravenous Stopped 09/22/22 1035)  aztreonam (AZACTAM) 2 g in sodium chloride 0.9 % 100  mL IVPB (0 g Intravenous Stopped 09/22/22 0959)  metroNIDAZOLE (FLAGYL) IVPB 500 mg (0 mg Intravenous Stopped 09/22/22 1123)     IMPRESSION / MDM / ASSESSMENT AND PLAN / ED COURSE  I reviewed the triage vital signs and the nursing notes.                              Differential diagnosis includes, but is not limited to, pyelonephritis, sepsis, dehydration, AKI, electrolyte abnormality, anemia, anaphylaxis, diverticulitis, intra-abdominal abscess, bowel obstruction, pulmonary embolism, pneumonia  Patient's presentation is most consistent with acute presentation with potential threat to life or bodily function.  Patient presents with generalized weakness and lightheadedness, gradual onset and worsening over the last 5 days.  Also had some initial abdominal pain and has some abdominal tenderness although not peritoneal.  Vital signs concerning for shock with tachycardia and hypotension, possibly anaphylactic due to Macrobid.  Will give Benadryl, Solu-Medrol, EpiPen.  We will check labs, obtain CT scans, continue IV fluids.  ----------------------------------------- 12:59 PM on 09/22/2022 ----------------------------------------- Vital signs normalized after medications and fluids.  Labs show AKI, leukocytosis, lactate of 5, all consistent with septic shock.  CT abdomen pelvis does not show any acute abnormalities.  Will admit for further management of sepsis in the setting of UTI, complicated by patient's intolerance/anaphylactic reaction to Macrobid.       FINAL CLINICAL IMPRESSION(S) / ED DIAGNOSES   Final  diagnoses:  Shock ([place])  Malignant neoplasm of urinary bladder, unspecified site [facility])     Rx / DC Orders   ED Discharge Orders     None        Note:  This document was prepared using Dragon voice recognition software and may include unintentional dictation errors.   Carrie Mew, MD 09/22/22 1300

## 2022-09-22 NOTE — Assessment & Plan Note (Signed)
Continue Remeron and trazodone

## 2022-09-22 NOTE — Assessment & Plan Note (Signed)
Hold lisinopril/HCTZ due to hypotension

## 2022-09-22 NOTE — Assessment & Plan Note (Signed)
Patient with a history of bladder cancer receiving intravesical BCG, has received 2/ 3 treatments. Follow-up with urology as an outpatient

## 2022-09-22 NOTE — Assessment & Plan Note (Signed)
Most likely secondary to ATN from hypotension related to sepsis Patient has a baseline serum creatinine of 1.05 and today on admission it is 2.47 Hold lisinopril/HCTZ Hold NSAIDs Continue aggressive IV fluid resuscitation Repeat renal parameters in a.m.

## 2022-09-22 NOTE — Assessment & Plan Note (Signed)
Continue PPI ?

## 2022-09-22 NOTE — Consult Note (Signed)
Pharmacy Antibiotic Note  Trevor [name] is a 75 y.o. male admitted on 09/22/2022 with  urosepsis .  Pharmacy has been consulted for Zosyn dosing.  Assessment: 76 yo M with PMH bladder cancer (active treatment), HTN, depression, GERD, chronic back pain presents with 4 day h/o weakness and abdominal pain in periumbilical area associated with anorexia and nausea. Has been taking nitrofurantoin (10/3 Ucx E. faecalis) for UTI for 3 days without benefit. Pt was afebrile, but hypotensive and tachycardic with WBC 18 upon arrival, with lactic acid 5.2. CTAP shows no evidence of intestinal obstruction or hydronephrosis. Pt is currently eating. RVP negative. Bcx collected. Pt is in AKI, with Scr 2.47 (baseline Scr ~1.0). Received one-time doses of aztreonam, vancomycin, and metronidazole in the ED.  Pt has notable allergy to ceftriaxone on chart, however, there is some uncertainty regarding whether the reaction (chills, mouth blisters) was herpetic because patient received valacyclovir during that same encounter. Discussion with the patient yielded no new information about this.  Plan: Initiate Zosyn 3.375 g IV q8H Monitor renal function to assess for any necessary changes in dose because patient is in AKI Follow up cultures to assess for any opportunities to de-escalate therapy  Height: '5\' 7"'$  (170.2 cm) Weight: 81.6 kg (179 lb 14.3 oz) IBW/kg (Calculated) : 66.1  Temp (24hrs), Avg:98.4 F (36.9 C), Min:98.1 F (36.7 C), Max:98.7 F (37.1 C)  Recent Labs  Lab 09/22/22 0753 09/22/22 0754 09/22/22 1003 09/22/22 1330  WBC 18.9*  --   --   --   CREATININE 2.47*  --   --   --   LATICACIDVEN  --  5.2* 5.0* 3.2*    Estimated Creatinine Clearance: 26 mL/min (A) (by C-G formula based on SCr of 2.47 mg/dL (H)).    Allergies  Allergen Reactions   Ceftriaxone Hives    Tolerated 1st generation cephalosporin (CEFAZOLIN) on 06/20/2018 and 09/11/2019 without documented ADRs. Previously tolerated  amoxicillin  Blisters and Hives around mouth and neck.  Some blistering of lips.  Mouth not effected.  Patient started on ceftriaxone and doxycyline 1-2 days prior to reaction.     Doxycycline Hives    Blisters and Hives around mouth and neck.  Some blistering of lips.  Mouth not effected. Patient started on ceftriaxone and doxycyline 1-2 days prior to reaction.     Macrobid [Nitrofurantoin] Rash    Antimicrobials this admission: Aztreonam 10/12 x 1 Metronidazole 10/12 x 1 Vancomycin 10/12 x 1 Zosyn 10/12 >>  Dose adjustments this admission: N/A  Microbiology results: 10/12 BCx: collected  Thank you for allowing pharmacy to be a part of this patient's care.  Dara Hoyer, PharmD PGY-1 Pharmacy Resident 09/22/2022 3:12 PM

## 2022-09-22 NOTE — ED Triage Notes (Signed)
Pt here with weakness. Pt states he is being treated for a bladder infx and is currently dealing with bladder cancer as well. Pt denies N/V/D.

## 2022-09-22 NOTE — Consult Note (Signed)
PHARMACY -  BRIEF ANTIBIOTIC NOTE   Pharmacy has received consult(s) for aztreonam + vancomycin from an ED provider.  The patient's profile has been reviewed for ht/wt/allergies/indication/available labs.    One time order(s) placed for Vancomycin 1.75g IV x1 and Aztreonam 2g IV x1 ED,   Further antibiotics/pharmacy consults should be ordered by admitting physician if indicated.                       Thank you, Darrick Penna 09/22/2022  8:40 AM

## 2022-09-22 NOTE — Progress Notes (Signed)
PHARMACIST - PHYSICIAN COMMUNICATION  CONCERNING:  Enoxaparin (Lovenox) for DVT Prophylaxis    RECOMMENDATION: Patient was prescribed enoxaparin '40mg'$  q24 hours for VTE prophylaxis.   Filed Weights   09/22/22 0744  Weight: 81.6 kg (179 lb 14.3 oz)    Body mass index is 28.18 kg/m.  Estimated Creatinine Clearance: 26 mL/min (A) (by C-G formula based on SCr of 2.47 mg/dL (H)).   Patient is candidate for enoxaparin '30mg'$  every 24 hours based on CrCl <76m/min or Weight <45kg  DESCRIPTION: [facility]  Patient is now receiving enoxaparin 30 mg every 24 hours    ABenita Gutter10/11/2022 2:11 PM

## 2022-09-22 NOTE — ED Provider Triage Note (Signed)
Emergency Medicine Provider Triage Evaluation Note  Trevor [name] , a 76 y.o. male  was evaluated in triage.  Pt complains of weakness and fatigue since Monday.  No cough or congestion.  States feels weak and will have some shortness of breath.  .  Review of Systems  Positive: Weakness Negative: Fever  Physical Exam  BP (!) 74/50 (BP Location: Left Arm)   Pulse (!) 114   Temp 98.7 F (37.1 C) (Oral)   Resp 16   Ht '5\' 7"'$  (1.702 m)   Wt 81.6 kg   SpO2 94%   BMI 28.18 kg/m  Gen:   Awake, no distress   Resp:  Normal effort  MSK:   Moves extremities without difficulty  Other:    Medical Decision Making  Medically screening exam initiated at 7:58 AM.  Appropriate orders placed.  Trevor [name] was informed that the remainder of the evaluation will be completed by another provider, this initial triage assessment does not replace that evaluation, and the importance of remaining in the ED until their evaluation is complete.  Start sepsis protocols, give patient fluids, BP low, going to flex immediately   Versie Starks, PA-C 09/22/22 [phone]

## 2022-09-22 NOTE — Assessment & Plan Note (Addendum)
As evidenced by hypotension that responded to IV fluid resuscitation, tachycardia, leukocytosis, marked lactic acidosis and pyuria.  Patient also has an acute kidney injury related to hypotension from sepsis. Patient has a history of bladder cancer and is receiving intravesical BCG.  Last treatment was on 09/13/22. He had a urine culture positive for Enterococcus faecalis and was placed on Macrobid. He appears to be septic from a urinary source with failure of oral antibiotic therapy. Continue aggressive IV fluid resuscitation Place patient empirically on vancomycin and Azactam until urine culture results become available Follow-up results of blood cultures

## 2022-09-22 NOTE — ED Notes (Signed)
See triage note  Presents with  weakness since Monday Denies any fever or n/v/d  Currently afebrile on arrival   denies any pain

## 2022-09-22 NOTE — H&P (Signed)
History and Physical    Patient: Trevor [name] YCX:[id number] DOB: 1946-05-02 DOA: 09/22/2022 DOS: the patient was seen and examined on 09/22/2022 PCP: Derinda Late, MD  Patient coming from: Home  Chief Complaint:  Chief Complaint  Patient presents with   Weakness   HPI: Trevor [name] is a 76 y.o. male with medical history significant for bladder cancer receiving intravesical BCG treatment  last treatment was on 09/13/22 92/3), history of depression, GERD, hypertension, chronic low back pain who presents to the ER via private vehicle for evaluation of weakness and feeling unwell for about 4 days. Patient states that his symptoms started this past weekend complaints of abdominal pain mostly in the periumbilical area associated with anorexia, nausea but no vomiting.  He also complains of feeling very weak and had been laying in bed for several days.  He was placed on nitrofurantoin for urinary tract infection and has only been able to take 3 days worth of therapy because he did not feel well.  He notes that he also had some rhinorrhea. He denies having any changes in his bowel habits, denies having any fever, no chills, no cough, no headache, no dizziness or lightheadedness, no focal deficits or blurred vision. Patient also has a macular rash which developed about 3 days prior to this hospitalization.  It involves his arms and legs and is nonpruritic.  He has not used any new creams or soaps and is unsure if it is related to the Macrobid that he started taking a couple of days ago. Upon arrival to the ER he was hypotensive with blood pressure 74/50, he was tachycardic and labs showed a white count of 18,000. Patient received 2.5 L fluid bolus, Benadryl, Solu-Medrol and EpiPen injection for presumed anaphylactic reaction. He also received a dose of Azactam and vancomycin and will be admitted to the hospital for further evaluation.   Review of Systems: As mentioned in the history of present  illness. All other systems reviewed and are negative. Past Medical History:  Diagnosis Date   Acne    Allergic rhinitis    Anemia    Anxiety    BPH (benign prostatic hypertrophy) with urinary obstruction    Cancer (HCC)    bladder CA; remission since Oct 2014   Carpal tunnel syndrome    Cervicalgia    Degenerative disc disease, cervical    neck and back;    Depression    controlled;    Gastritis    GERD (gastroesophageal reflux disease)    Hemorrhoids    History of malignant neoplasm of bladder    Hyperlipidemia    Hypertension    controlled with medication;    Hypospadias    Low back pain    Mitral regurgitation    Pre-diabetes    Sleep apnea    Urinary frequency    Past Surgical History:  Procedure Laterality Date   ADENOIDECTOMY     ANTERIOR FUSION LUMBAR SPINE  11/20/2017   multiple levels; [name]   ANTERIOR LAT LUMBAR FUSION N/A 09/11/2019   Procedure: PRONE XLIF L2-3;  Surgeon: Meade Maw, MD;  Location: [facility] ORS;  Service: Neurosurgery;  Laterality: N/A;   BLADDER SURGERY     2011, 2013, 2015   COLONOSCOPY WITH PROPOFOL N/A 07/31/2015   Procedure: COLONOSCOPY WITH PROPOFOL;  Surgeon: Manya Silvas, MD;  Location: [facility] ENDOSCOPY;  Service: Endoscopy;  Laterality: N/A;   COLONOSCOPY, ESOPHAGOGASTRODUODENOSCOPY (EGD) AND ESOPHAGEAL DILATION  2006   CYSTOSCOPY W/ RETROGRADES Bilateral  11/11/2021   Procedure: CYSTOSCOPY WITH RETROGRADE PYELOGRAM;  Surgeon: Hollice Espy, MD;  Location: [facility] ORS;  Service: Urology;  Laterality: Bilateral;   CYSTOSCOPY WITH BIOPSY N/A 11/11/2021   Procedure: CYSTOSCOPY WITH BLADDER BIOPSY;  Surgeon: Hollice Espy, MD;  Location: [facility] ORS;  Service: Urology;  Laterality: N/A;   [place]   LUMBAR LAMINECTOMY/DECOMPRESSION MICRODISCECTOMY N/A 06/20/2018   Procedure: LUMBAR LAMINECTOMY/DECOMPRESSION MICRODISCECTOMY 1 LEVEL-L-2-3;  Surgeon: Meade Maw, MD;  Location: [facility] ORS;  Service:  Neurosurgery;  Laterality: N/A;   POSTERIOR FUSION LUMBAR SPINE  10/02/2019   L2-4   POSTERIOR LUMBAR FUSION 4 WITH HARDWARE REMOVAL N/A 09/11/2019   Procedure: L2-S1 POSTERIOR FUSION, ABORTED;  Surgeon: Meade Maw, MD;  Location: [facility] ORS;  Service: Neurosurgery;  Laterality: N/A;   SEPTOPLASTY  1996   TONSILLECTOMY     Social History:  reports that he has never smoked. He has never used smokeless tobacco. He reports that he does not drink alcohol and does not use drugs.  Allergies  Allergen Reactions   Ceftriaxone Hives    Tolerated 1st generation cephalosporin (CEFAZOLIN) on 06/20/2018 and 09/11/2019 without documented ADRs. Previously tolerated amoxicillin  Blisters and Hives around mouth and neck.  Some blistering of lips.  Mouth not effected.  Patient started on ceftriaxone and doxycyline 1-2 days prior to reaction.     Doxycycline Hives    Blisters and Hives around mouth and neck.  Some blistering of lips.  Mouth not effected. Patient started on ceftriaxone and doxycyline 1-2 days prior to reaction.     Macrobid [Nitrofurantoin] Rash    Family History  Problem Relation Age of Onset   Hyperlipidemia Brother    Hyperlipidemia Sister    Heart attack Father     Prior to Admission medications   Medication Sig Start Date End Date Taking? Authorizing Provider  acetaminophen (TYLENOL) 650 MG CR tablet Take 1,300 mg by mouth every 8 (eight) hours as needed for pain.   Yes [provider]  aspirin 81 MG EC tablet Take 81 mg by mouth daily.   Yes [provider]  fluticasone (FLONASE) 50 MCG/ACT nasal spray Place 2 sprays into both nostrils daily as needed for allergies.   Yes [provider]  gabapentin (NEURONTIN) 300 MG capsule Take 300 mg by mouth 2 (two) times daily.   Yes [provider]  lisinopril-hydrochlorothiazide (ZESTORETIC) 20-25 MG tablet Take 0.5 tablets by mouth daily. 02/16/21  Yes [provider]  mirtazapine  (REMERON) 15 MG tablet Take 15 mg by mouth at bedtime. 08/29/22  Yes [provider]  Multiple Vitamin (MULTIVITAMIN WITH MINERALS) TABS tablet Take 1 tablet by mouth daily.   Yes [provider]  naproxen sodium (ALEVE) 220 MG tablet Take 220 mg by mouth 2 (two) times daily as needed (pain).   Yes [provider]  nitrofurantoin, macrocrystal-monohydrate, (MACROBID) 100 MG capsule Take 1 capsule (100 mg total) by mouth 2 (two) times daily for 7 days. 09/16/22 09/23/22 Yes Vaillancourt, Aldona Bar, PA-C  Omeprazole 20 MG TBEC Take 20 mg by mouth daily.   Yes [provider]  senna-docusate (SENOKOT-S) 8.6-50 MG tablet Take 2 tablets by mouth 2 (two) times daily. Patient taking differently: Take 2 tablets by mouth daily. 07/13/18  Yes Love, Ivan Anchors, PA-C  simvastatin (ZOCOR) 40 MG tablet Take 40 mg by mouth daily.   Yes [provider]  tamsulosin (FLOMAX) 0.4 MG CAPS capsule TAKE 1 CAPSULE DAILY 01/27/22  Yes Hollice Espy, MD  traZODone (DESYREL) 100 MG tablet Take 100 mg by mouth at bedtime.   Yes [provider]  vitamin B-12 (CYANOCOBALAMIN) 500 MCG tablet Take 500 mcg by mouth daily.   Yes [provider]  benzonatate (TESSALON) 200 MG capsule Take by mouth. Patient not taking: Reported on 09/22/2022 08/12/22   [provider]  methocarbamol (ROBAXIN) 500 MG tablet Take 500 mg by mouth 3 (three) times daily. Patient not taking: Reported on 09/22/2022 04/25/22   [provider]  OVER THE COUNTER MEDICATION Take 2 capsules by mouth daily. Omega XL    [provider]  triamcinolone cream (KENALOG) 0.1 % Apply 1 application topically daily as needed (rash). Patient not taking: Reported on 09/22/2022 10/04/21   [provider]  valACYclovir (VALTREX) 1000 MG tablet Take 2,000 mg by mouth 2 (two) times daily as needed (fever blisters). Patient not taking: Reported on 09/22/2022 11/01/21   [provider]    Physical Exam: Vitals:   09/22/22 0821 09/22/22 1002 09/22/22 1105 09/22/22 1214  BP: (!) 91/45 (!) 106/59 (!) 112/57 122/68  Pulse: 98 94 87 81  Resp: '16 20 18 18  '$ Temp:    98.1 F (36.7 C)  TempSrc:    Oral  SpO2: 96% 96% 96% 96%  Weight:      Height:       Physical Exam Vitals and nursing note reviewed.  Constitutional:      Comments: Chronically ill-appearing  HENT:     Head: Normocephalic and atraumatic.     Nose: Nose normal.     Mouth/Throat:     Mouth: Mucous membranes are dry.  Eyes:     Comments: Pale conjunctiva  Cardiovascular:     Rate and Rhythm: Regular rhythm.  Pulmonary:     Effort: Pulmonary effort is normal.     Breath sounds: Normal breath sounds.  Abdominal:     General: Abdomen is flat. Bowel sounds are normal.     Palpations: Abdomen is soft.  Musculoskeletal:     Cervical back: Normal range of motion and neck supple.  Skin:    Comments: Macular nonpruritic rash involving his upper and lower extremities  Neurological:     Mental Status: He is alert.     Motor: Weakness present.  Psychiatric:        Mood and Affect: Mood normal.        Behavior: Behavior normal.     Data Reviewed: Relevant notes from primary care and specialist visits, past discharge summaries as available in EHR, including Care Everywhere. Prior diagnostic testing as pertinent to current admission diagnoses Updated medications and problem lists for reconciliation ED course, including vitals, labs, imaging, treatment and response to treatment Triage notes, nursing and pharmacy notes and ED provider's notes Notable results as noted in HPI. Labs reviewed.  Lactic acid 5.2 >> 5.0, troponin 8, BNP 57, lipase 68, magnesium 1.9, sodium 137, potassium 3.2, chloride 102, bicarb 20, glucose 149, BUN 47, creatinine 2.47, calcium 8.5, total protein 6.8, albumin 3.4, AST 42, ALT 26, alkaline phosphatase 67, total bilirubin 1.0, white count 18.9, hemoglobin 12.4,  hematocrit 37.4, platelet count 141 Respiratory viral panel is negative Chest x-ray reviewed by me shows no evidence of acute cardiopulmonary disease CT scan of abdomen and pelvis shows  no evidence of intestinal obstruction or pneumoperitoneum. There is no hydronephrosis.New small bilateral pleural effusions. Increased markings in the periphery of lower lung fields may suggest scarring from chronic interstitial lung disease. Extensive coronary  artery calcifications are seen. Cirrhosis of liver.  Spleen is enlarged.  Lumbar spondylosis. Twelve-lead EKG reviewed by me shows sinus tachycardia with left axis deviation There are no new results to review at this time.  Assessment and Plan: * Sepsis secondary to UTI [facility]) As evidenced by hypotension that responded to IV fluid resuscitation, tachycardia, leukocytosis, marked lactic acidosis and pyuria.  Patient also has an acute kidney injury related to hypotension from sepsis. Patient has a history of bladder cancer and is receiving intravesical BCG.  Last treatment was on 09/13/22. He had a urine culture positive for Enterococcus faecalis and was placed on Macrobid. He appears to be septic from a urinary source with failure of oral antibiotic therapy. Continue aggressive IV fluid resuscitation Place patient empirically on vancomycin and Azactam until urine culture results become available Follow-up results of blood cultures  AKI (acute kidney injury) ([place]) Most likely secondary to ATN from hypotension related to sepsis Patient has a baseline serum creatinine of 1.05 and today on admission it is 2.47 Hold lisinopril/HCTZ Hold NSAIDs Continue aggressive IV fluid resuscitation Repeat renal parameters in a.m.  Gastritis Continue PPI  Depression Continue Remeron and trazodone  Hypokalemia Most likely related to diuretic use Supplement potassium Magnesium level is within normal limits  Essential hypertension Hold lisinopril/HCTZ due to  hypotension  Malignant neoplasm of lateral wall of urinary bladder ([place]) Patient with a history of bladder cancer receiving intravesical BCG, has received 2/ 3 treatments. Follow-up with urology as an outpatient      Advance Care Planning:   Code Status: DNR   Consults: None  Family Communication: Greater than 50% of time was spent discussing patient's condition and plan of care with him at the bedside.  All questions and concerns have been addressed.  He verbalizes understanding and agrees with the plan.  Severity of Illness: The appropriate patient status for this patient is INPATIENT. Inpatient status is judged to be reasonable and necessary in order to provide the required intensity of service to ensure the patient's safety. The patient's presenting symptoms, physical exam findings, and initial radiographic and laboratory data in the context of their chronic comorbidities is felt to place them at high risk for further clinical deterioration. Furthermore, it is not anticipated that the patient will be medically stable for discharge from the hospital within 2 midnights of admission.   * I certify that at the point of admission it is my clinical judgment that the patient will require inpatient hospital care spanning beyond 2 midnights from the point of admission due to high intensity of service, high risk for further deterioration and high frequency of surveillance required.*  Author: Collier Bullock, MD 09/22/2022 2:14 PM  For on call review [email or web address].

## 2022-09-23 DIAGNOSIS — N179 Acute kidney failure, unspecified: Secondary | ICD-10-CM | POA: Diagnosis not present

## 2022-09-23 DIAGNOSIS — C679 Malignant neoplasm of bladder, unspecified: Secondary | ICD-10-CM

## 2022-09-23 DIAGNOSIS — A419 Sepsis, unspecified organism: Secondary | ICD-10-CM | POA: Diagnosis not present

## 2022-09-23 DIAGNOSIS — I10 Essential (primary) hypertension: Secondary | ICD-10-CM

## 2022-09-23 DIAGNOSIS — E876 Hypokalemia: Secondary | ICD-10-CM

## 2022-09-23 LAB — BASIC METABOLIC PANEL
Anion gap: 8 (ref 5–15)
BUN: 36 mg/dL — ABNORMAL HIGH (ref 8–23)
CO2: 23 mmol/L (ref 22–32)
Calcium: 8.5 mg/dL — ABNORMAL LOW (ref 8.9–10.3)
Chloride: 109 mmol/L (ref 98–111)
Creatinine, Ser: 1.27 mg/dL — ABNORMAL HIGH (ref 0.61–1.24)
GFR, Estimated: 59 mL/min — ABNORMAL LOW (ref >60)
Glucose, Bld: 115 mg/dL — ABNORMAL HIGH (ref 70–99)
Potassium: 3.1 mmol/L — ABNORMAL LOW (ref 3.5–5.1)
Sodium: 140 mmol/L (ref 135–145)

## 2022-09-23 LAB — CBC
HCT: 29.1 % — ABNORMAL LOW (ref 39.0–52.0)
Hemoglobin: 9.9 g/dL — ABNORMAL LOW (ref 13.0–17.0)
MCH: 28.4 pg (ref 26.0–34.0)
MCHC: 34 g/dL (ref 30.0–36.0)
MCV: 83.4 fL (ref 80.0–100.0)
Platelets: 111 10*3/uL — ABNORMAL LOW (ref 150–400)
RBC: 3.49 MIL/uL — ABNORMAL LOW (ref 4.22–5.81)
RDW: 13 % (ref 11.5–15.5)
WBC: 8.9 10*3/uL (ref 4.0–10.5)
nRBC: 0 % (ref 0.0–0.2)

## 2022-09-23 LAB — PROTIME-INR
INR: 1.6 — ABNORMAL HIGH (ref 0.8–1.2)
Prothrombin Time: 19.1 seconds — ABNORMAL HIGH (ref 11.4–15.2)

## 2022-09-23 LAB — LACTIC ACID, PLASMA
Lactic Acid, Venous: 1.6 mmol/L (ref 0.5–1.9)
Lactic Acid, Venous: 1.2 mmol/L (ref 0.5–1.9)

## 2022-09-23 LAB — CORTISOL-AM, BLOOD: Cortisol - AM: 3.2 ug/dL — ABNORMAL LOW (ref 6.7–22.6)

## 2022-09-23 LAB — PROCALCITONIN: Procalcitonin: 6.25 ng/mL

## 2022-09-23 MED ORDER — ENOXAPARIN SODIUM 40 MG/0.4ML IJ SOSY
40.0000 mg | PREFILLED_SYRINGE | INTRAMUSCULAR | Status: DC
  Administered 2022-09-23 – 2022-09-24 (×2): 40 mg via SUBCUTANEOUS
  Filled 2022-09-23 (×2): qty 0.4

## 2022-09-23 MED ORDER — POTASSIUM CHLORIDE CRYS ER 20 MEQ PO TBCR
40.0000 meq | EXTENDED_RELEASE_TABLET | ORAL | Status: AC
  Administered 2022-09-23 (×2): 40 meq via ORAL
  Filled 2022-09-23 (×2): qty 2

## 2022-09-23 MED ORDER — LACTATED RINGERS IV SOLN: INTRAVENOUS | Status: AC

## 2022-09-23 NOTE — Progress Notes (Signed)
[facility]  PROGRESS NOTE  LANE ELAND OYD:[id number] DOB: 1946/04/21 DOA: 09/22/2022 PCP: Derinda Late, MD   Brief HPI:   76 year old male with medical history for bladder cancer receiving intravesical BCG treatment, last treatment was on 09/13/2022, history of depression, GERD, hypertension, chronic low back pain presented to the ER via private vehicle for evaluation of weakness and feeling unwell for the past 4 days.  Also complains of abdominal pain in the periumbilical area along with anorexia, nausea but no vomiting. He was placed on nitrofurantoin for UTI and has not been able to take it more than 3 days because he was not feeling well. He also had macular rash developed 3 days prior to hospitalization, in the ED blood pressure was 74 x 50, tachycardia with WBC 18,000.  Patient received 2.5 L of fluid bolus, Benadryl, Solu-Medrol, EpiPen for presumed anaphylactic reaction. He also received a dose of Azactam and vancomycin    Subjective   Patient seen and examined, denies abdominal pain.   Assessment/Plan:    Sepsis due to UTI -Presented with hypotension; there is wanted to IV fluid challenge -Sepsis physiology has resolved; WBC is down to 8.9 -Also has acute kidney injury due to hypotension and sepsis -Patient had urine culture positive for Enterococcus faecalis and was placed on Macrobid as outpatient -Current replaced on vancomycin and Azactam until urine culture results available obtained in the ED -Follow blood culture results  Acute kidney injury -Likely from ATN from hypotension due to sepsis -Patient has baseline creatinine of 1.05; presented with creatinine of 2.47 -Hold HCTZ/lisinopril, hold NSAIDs -Started on IV fluids as above -Creatinine has improved to 1.27 -We will cut down LR to 75 mill per hour for 12 hours  Gastritis -Continue Protonix 40 mg daily  Depression -Continue Remeron, trazodone  Hypokalemia -Secondary to diuretic use -Potassium  is still low -We will give K-Dur 40 mg p.o. x2 -Follow BMP in am  Hypertension -Blood pressure improved with IV fluids -Continue to hold lisinopril/HCTZ  Malignant neoplasm of lateral wall of urinary bladder -Patient has history of bladder cancer receiving intravesical BCG; has received 2 out of 3 treatments -Follow-up with urology as outpatient  Medications     aspirin EC  81 mg Oral Daily   cyanocobalamin  500 mcg Oral Daily   enoxaparin (LOVENOX) injection  40 mg Subcutaneous Q24H   gabapentin  300 mg Oral BID   mirtazapine  15 mg Oral QHS   multivitamin with minerals  1 tablet Oral Daily   ondansetron (ZOFRAN) IV  4 mg Intravenous Once   pantoprazole  40 mg Oral Daily   senna-docusate  2 tablet Oral Daily   simvastatin  40 mg Oral QHS   tamsulosin  0.4 mg Oral Daily   traZODone  100 mg Oral QHS     Data Reviewed:   CBG:  No results for input(s): "GLUCAP" in the last 168 hours.  SpO2: 99 %    Vitals:   09/22/22 2300 09/23/22 0400 09/23/22 0952 09/23/22 1300  BP: 131/73 (!) 113/54 134/71 (!) 149/73  Pulse: 73  71 62  Resp: 18 18 (!) 23 18  Temp: 98 F (36.7 C) 98.6 F (37 C) 98.1 F (36.7 C)   TempSrc: Oral Oral Oral   SpO2: 95% 96% 100% 99%  Weight:      Height:          Data Reviewed:  Basic Metabolic Panel: Recent Labs  Lab 09/22/22 0753 09/23/22 0801  NA 137  140  K 3.2* 3.1*  CL 101 109  CO2 20* 23  GLUCOSE 149* 115*  BUN 47* 36*  CREATININE 2.47* 1.27*  CALCIUM 8.5* 8.5*  MG 1.9  --     CBC: Recent Labs  Lab 09/22/22 0753 09/23/22 0801  WBC 18.9* 8.9  NEUTROABS 16.8*  --   HGB 12.4* 9.9*  HCT 37.4* 29.1*  MCV 86.2 83.4  PLT 141* 111*    LFT Recent Labs  Lab 09/22/22 0753  AST 42*  ALT 26  ALKPHOS 67  BILITOT 1.0  PROT 6.8  ALBUMIN 3.4*     Antibiotics: Anti-infectives (From admission, onward)    Start     Dose/Rate Route Frequency Ordered Stop   09/22/22 1700  piperacillin-tazobactam (ZOSYN) IVPB 3.375 g         3.375 g 12.5 mL/hr over 240 Minutes Intravenous Every 8 hours 09/22/22 1458     09/22/22 0845  aztreonam (AZACTAM) 2 g in sodium chloride 0.9 % 100 mL IVPB        2 g 200 mL/hr over 30 Minutes Intravenous  Once 09/22/22 0838 09/22/22 0959   09/22/22 0845  metroNIDAZOLE (FLAGYL) IVPB 500 mg        500 mg 100 mL/hr over 60 Minutes Intravenous  Once 09/22/22 0838 09/22/22 1123   09/22/22 0845  vancomycin (VANCOCIN) IVPB 1000 mg/200 mL premix  Status:  Discontinued        1,000 mg 200 mL/hr over 60 Minutes Intravenous  Once 09/22/22 0838 09/22/22 0841   09/22/22 0845  vancomycin (VANCOREADY) IVPB 1750 mg/350 mL        1,750 mg 175 mL/hr over 120 Minutes Intravenous  Once 09/22/22 0841 09/22/22 1402        DVT prophylaxis: Lovenox  Code Status: DNR  Family Communication: No family at bedside   CONSULTS    Objective    Physical Examination:  General-appears in no acute distress Heart-S1-S2, regular, no murmur auscultated Lungs-clear to auscultation bilaterally, no wheezing or crackles auscultated Abdomen-soft, nontender, no organomegaly Extremities-no edema in the lower extremities Neuro-alert, oriented x3, no focal deficit noted  Status is: Inpatient:             Oswald Hillock   [facility] If 7PM-7AM, please contact night-coverage at [email or web address], Office  [phone]   09/23/2022, 3:23 PM  LOS: 1 day

## 2022-09-24 DIAGNOSIS — I10 Essential (primary) hypertension: Secondary | ICD-10-CM | POA: Diagnosis not present

## 2022-09-24 DIAGNOSIS — N179 Acute kidney failure, unspecified: Secondary | ICD-10-CM | POA: Diagnosis not present

## 2022-09-24 DIAGNOSIS — A419 Sepsis, unspecified organism: Secondary | ICD-10-CM | POA: Diagnosis not present

## 2022-09-24 DIAGNOSIS — C679 Malignant neoplasm of bladder, unspecified: Secondary | ICD-10-CM | POA: Diagnosis not present

## 2022-09-24 LAB — CBC
HCT: 29.8 % — ABNORMAL LOW (ref 39.0–52.0)
Hemoglobin: 9.9 g/dL — ABNORMAL LOW (ref 13.0–17.0)
MCH: 28.4 pg (ref 26.0–34.0)
MCHC: 33.2 g/dL (ref 30.0–36.0)
MCV: 85.4 fL (ref 80.0–100.0)
Platelets: 83 10*3/uL — ABNORMAL LOW (ref 150–400)
RBC: 3.49 MIL/uL — ABNORMAL LOW (ref 4.22–5.81)
RDW: 13.1 % (ref 11.5–15.5)
WBC: 6.8 10*3/uL (ref 4.0–10.5)
nRBC: 0 % (ref 0.0–0.2)

## 2022-09-24 LAB — BASIC METABOLIC PANEL
Anion gap: 7 (ref 5–15)
BUN: 28 mg/dL — ABNORMAL HIGH (ref 8–23)
CO2: 23 mmol/L (ref 22–32)
Calcium: 8.4 mg/dL — ABNORMAL LOW (ref 8.9–10.3)
Chloride: 111 mmol/L (ref 98–111)
Creatinine, Ser: 1.09 mg/dL (ref 0.61–1.24)
GFR, Estimated: >60 mL/min (ref >60)
Glucose, Bld: 77 mg/dL (ref 70–99)
Potassium: 4 mmol/L (ref 3.5–5.1)
Sodium: 141 mmol/L (ref 135–145)

## 2022-09-24 LAB — MAGNESIUM: Magnesium: 1.7 mg/dL (ref 1.7–2.4)

## 2022-09-24 MED ORDER — MAGNESIUM SULFATE IN D5W 1-5 GM/100ML-% IV SOLN
1.0000 g | Freq: Once | INTRAVENOUS | Status: AC
  Administered 2022-09-24: 1 g via INTRAVENOUS
  Filled 2022-09-24: qty 100

## 2022-09-24 MED ORDER — CARVEDILOL 3.125 MG PO TABS
3.1250 mg | ORAL_TABLET | Freq: Two times a day (BID) | ORAL | Status: DC
  Administered 2022-09-24 – 2022-09-25 (×2): 3.125 mg via ORAL
  Filled 2022-09-24 (×3): qty 1

## 2022-09-24 NOTE — Progress Notes (Signed)
[facility]  PROGRESS NOTE  Trevor [name] HYW:[id number] DOB: Jan 22, 1946 DOA: 09/22/2022 PCP: Derinda Late, MD   Brief HPI:   76 year old male with medical history for bladder cancer receiving intravesical BCG treatment, last treatment was on 09/13/2022, history of depression, GERD, hypertension, chronic low back pain presented to the ER via private vehicle for evaluation of weakness and feeling unwell for the past 4 days.  Also complains of abdominal pain in the periumbilical area along with anorexia, nausea but no vomiting. He was placed on nitrofurantoin for UTI and has not been able to take it more than 3 days because he was not feeling well. He also had macular rash developed 3 days prior to hospitalization, in the ED blood pressure was 74 x 50, tachycardia with WBC 18,000.  Patient received 2.5 L of fluid bolus, Benadryl, Solu-Medrol, EpiPen for presumed anaphylactic reaction. He also received a dose of Azactam and vancomycin    Subjective   Patient seen and examined, had 8 beats of runs of nonsustained V. tach this morning.  Denies chest pain or palpitations.   Assessment/Plan:    Sepsis due to UTI -Presented with hypotension; there is wanted to IV fluid challenge -Sepsis physiology has resolved; WBC is down to 8.9 -Also has acute kidney injury due to hypotension and sepsis -Patient had urine culture positive for Enterococcus faecalis and was placed on Macrobid as outpatient -Currently placed on  vancomycin and Azactam ; no urine culture was obtained at the time of admission -Follow blood culture results  Acute kidney injury -Likely from ATN from hypotension due to sepsis -Patient has baseline creatinine of 1.05; presented with creatinine of 2.47 -Hold HCTZ/lisinopril, hold NSAIDs -Started on IV fluids as above -Creatinine has improved to 1.09 -We will discontinue IV fluids  Nonsustained V. Tach -Had 8 beat run of nonsustained V. tach this morning -EKG obtained is  unremarkable -Check echocardiogram, start Coreg 3.125 mg p.o. twice daily -Serum magnesium 1.7; will give 1 g of magnesium sulfate IV  Hypertension -Started on Coreg 3.125 mg p.o. twice daily  Gastritis -Continue Protonix 40 mg daily  Depression -Continue Remeron, trazodone  Hypokalemia -Secondary to diuretic use -Potassium is still low -We will give K-Dur 40 mg p.o. x2 -Follow BMP in am  Hypertension -Blood pressure improved with IV fluids -Continue to hold lisinopril/HCTZ  Malignant neoplasm of lateral wall of urinary bladder -Patient has history of bladder cancer receiving intravesical BCG; has received 2 out of 3 treatments -Follow-up with urology as outpatient  Medications     aspirin EC  81 mg Oral Daily   carvedilol  3.125 mg Oral BID WC   cyanocobalamin  500 mcg Oral Daily   enoxaparin (LOVENOX) injection  40 mg Subcutaneous Q24H   gabapentin  300 mg Oral BID   mirtazapine  15 mg Oral QHS   multivitamin with minerals  1 tablet Oral Daily   ondansetron (ZOFRAN) IV  4 mg Intravenous Once   pantoprazole  40 mg Oral Daily   senna-docusate  2 tablet Oral Daily   simvastatin  40 mg Oral QHS   tamsulosin  0.4 mg Oral Daily   traZODone  100 mg Oral QHS     Data Reviewed:   CBG:  No results for input(s): "GLUCAP" in the last 168 hours.  SpO2: 94 %    Vitals:   09/24/22 0506 09/24/22 0822 09/24/22 1202 09/24/22 1652  BP: (!) 150/79 (!) 147/72 (!) 157/78 (!) 149/84  Pulse:  68 66 70  Resp: (!) '25 16 17 18  '$ Temp: 98.7 F (37.1 C) 98.2 F (36.8 C) 98 F (36.7 C)   TempSrc: Oral     SpO2: 97% 99% 98% 94%  Weight:      Height:          Data Reviewed:  Basic Metabolic Panel: Recent Labs  Lab 09/22/22 0753 09/23/22 0801 09/24/22 0501 09/24/22 1509  NA 137 140 141  --   K 3.2* 3.1* 4.0  --   CL 101 109 111  --   CO2 20* 23 23  --   GLUCOSE 149* 115* 77  --   BUN 47* 36* 28*  --   CREATININE 2.47* 1.27* 1.09  --   CALCIUM 8.5* 8.5* 8.4*  --    MG 1.9  --   --  1.7    CBC: Recent Labs  Lab 09/22/22 0753 09/23/22 0801 09/24/22 0501  WBC 18.9* 8.9 6.8  NEUTROABS 16.8*  --   --   HGB 12.4* 9.9* 9.9*  HCT 37.4* 29.1* 29.8*  MCV 86.2 83.4 85.4  PLT 141* 111* 83*    LFT Recent Labs  Lab 09/22/22 0753  AST 42*  ALT 26  ALKPHOS 67  BILITOT 1.0  PROT 6.8  ALBUMIN 3.4*     Antibiotics: Anti-infectives (From admission, onward)    Start     Dose/Rate Route Frequency Ordered Stop   09/22/22 1700  piperacillin-tazobactam (ZOSYN) IVPB 3.375 g        3.375 g 12.5 mL/hr over 240 Minutes Intravenous Every 8 hours 09/22/22 1458     09/22/22 0845  aztreonam (AZACTAM) 2 g in sodium chloride 0.9 % 100 mL IVPB        2 g 200 mL/hr over 30 Minutes Intravenous  Once 09/22/22 0838 09/22/22 0959   09/22/22 0845  metroNIDAZOLE (FLAGYL) IVPB 500 mg        500 mg 100 mL/hr over 60 Minutes Intravenous  Once 09/22/22 0838 09/22/22 1123   09/22/22 0845  vancomycin (VANCOCIN) IVPB 1000 mg/200 mL premix  Status:  Discontinued        1,000 mg 200 mL/hr over 60 Minutes Intravenous  Once 09/22/22 0838 09/22/22 0841   09/22/22 0845  vancomycin (VANCOREADY) IVPB 1750 mg/350 mL        1,750 mg 175 mL/hr over 120 Minutes Intravenous  Once 09/22/22 0841 09/22/22 1402        DVT prophylaxis: Lovenox  Code Status: DNR  Family Communication: No family at bedside   CONSULTS    Objective    Physical Examination:  General-appears in no acute distress Heart-S1-S2, regular, no murmur auscultated Lungs-clear to auscultation bilaterally, no wheezing or crackles auscultated Abdomen-soft, nontender, no organomegaly Extremities-no edema in the lower extremities Neuro-alert, oriented x3, no focal deficit noted  Status is: Inpatient:          Oswald Hillock   [facility] If 7PM-7AM, please contact night-coverage at [email or web address], Office  [phone]   09/24/2022, 5:10 PM  LOS: 2 days

## 2022-09-25 DIAGNOSIS — A419 Sepsis, unspecified organism: Secondary | ICD-10-CM | POA: Diagnosis not present

## 2022-09-25 DIAGNOSIS — N390 Urinary tract infection, site not specified: Secondary | ICD-10-CM | POA: Diagnosis not present

## 2022-09-25 MED ORDER — LISINOPRIL-HYDROCHLOROTHIAZIDE 20-25 MG PO TABS: 0.5000 | ORAL_TABLET | Freq: Every day | ORAL | Status: DC

## 2022-09-25 MED ORDER — CARVEDILOL 3.125 MG PO TABS: 3.1250 mg | ORAL_TABLET | Freq: Two times a day (BID) | ORAL | 0 refills | Status: AC

## 2022-09-25 MED ORDER — SENNOSIDES-DOCUSATE SODIUM 8.6-50 MG PO TABS: 2.0000 | ORAL_TABLET | Freq: Every evening | ORAL | Status: DC | PRN

## 2022-09-25 MED ORDER — CIPROFLOXACIN HCL 500 MG PO TABS: 500.0000 mg | ORAL_TABLET | Freq: Two times a day (BID) | ORAL | 0 refills | Status: AC

## 2022-09-25 MED ORDER — HYDROCHLOROTHIAZIDE 12.5 MG PO TABS
12.5000 mg | ORAL_TABLET | Freq: Every day | ORAL | Status: DC
  Administered 2022-09-25: 12.5 mg via ORAL
  Filled 2022-09-25: qty 1

## 2022-09-25 MED ORDER — LISINOPRIL 10 MG PO TABS
10.0000 mg | ORAL_TABLET | Freq: Every day | ORAL | Status: DC
  Administered 2022-09-25: 10 mg via ORAL
  Filled 2022-09-25: qty 1

## 2022-09-25 NOTE — Progress Notes (Signed)
Pt discharged to home. Family friend picked him up. Denies pain. Discharged paperwork discussed with pt. All questions/concerns answered; no further needs at this time.    09/25/22 1326  Vitals  Temp 98.1 F (36.7 C)  Temp Source Oral  BP (!) 134/92  MAP (mmHg) 103  BP Location Left Arm  BP Method Automatic  Patient Position (if appropriate) Sitting  Pulse Rate 97  Pulse Rate Source Monitor  Resp 18  Level of Consciousness  Level of Consciousness Alert  MEWS COLOR  MEWS Score Color Green  Oxygen Therapy  SpO2 98 %  O2 Device Room Air  Pain Assessment  Pain Scale 0-10  Pain Score 0

## 2022-09-25 NOTE — Hospital Course (Addendum)
76 year old male with medical history for bladder cancer receiving intravesical BCG treatment, last treatment was on 09/13/2022, history of depression, GERD, hypertension, chronic low back pain presented to the ER 09/22/2022 via private vehicle for evaluation of weakness and feeling unwell for the past 4 days.  Also complains of abdominal pain in the periumbilical area along with anorexia, nausea but no vomiting. He was placed on nitrofurantoin for UTI and has not been able to take it more than 3 days because he was not feeling well. He also had macular rash developed 3 days prior to hospitalization 10/12: in the ED blood pressure was 74/50, tachycardia, WBC 18,000 = sepsis d/t UTI. Patient received 2.5 L of fluid bolus, Benadryl, Solu-Medrol, EpiPen for presumed anaphylactic reaction. He also received a dose of Azactam and vancomycin. Admitted for sepsis and AKI 10/13: Sepsis parameters resolved. Continue IV abx pending cultures - no UCx at time of admission, awaiting BCx. Outpatient UCx (+)Enterococcus faecalis.  10/14: nonsustained Vtach on telemetry this morning, Echo pending, started Coreg 10/15: BCx NGx3d. Echo pending, in shared decision making discussion risks/benefits, pt opts to forego echocardiogram prior to discharge (ordered for brief run [place]) and will follow outpatient on this issue.   Consultants:  none  Procedures: none      ASSESSMENT & PLAN:   Principal Problem:   Sepsis secondary to UTI [facility]) Active Problems:   AKI (acute kidney injury) ([place])   Malignant neoplasm of lateral wall of urinary bladder ([facility])   Essential hypertension   Hypokalemia   Depression   Gastritis   Sepsis due to UTI - sepsis resolved Complicated UTI  -Presented with hypotension; there is wanted to IV fluid challenge -Sepsis physiology has resolved; WBC is down to 8.9 -Also has acute kidney injury due to hypotension and sepsis -Patient had urine culture positive for Enterococcus faecalis and  was placed on Macrobid as outpatient Currently placed on vancomycin and Azactam; no urine culture was obtained at the time of admission Follow blood culture results --> NG x3 days  PO abx on discharge w/ cipro for complicated UTI   Acute kidney injury - resolved from ATN from hypotension due to sepsis Patient has baseline creatinine of 1.05; presented with creatinine of 2.47 Treated w/ IV fluids which have been d/c after normalization of Cr  Hold HCTZ/lisinopril, hold NSAID Follow BMP   Nonsustained V. Tach Had 8 beat run of nonsustained V. tach yesterday morning EKG obtained was unremarkable Continue telemetry  Check echocardiogram - pt decided to defer this see above  start Coreg 3.125 mg p.o. twice daily Serum magnesium 1.7 yesterday, gave 1 g of magnesium sulfate IV  Hypotension on admission d/t Sepsis - hypotension resolved  Essential Hypertension Blood pressure improved with IV fluids Continue to hold lisinopril/HCTZ d/t AKI --> restarted on discharge  Continue beta blocker Coreg 3.125 mg p.o. twice daily  Hypomagnesemia  Serum magnesium 1.7 yesterday, gave 1 g of magnesium sulfate IV   Gastritis Continue Protonix 40 mg daily   Depression Continue Remeron, trazodone   Hypokalemia - resolved  Secondary to diuretic use Repleted Follow bMP   Malignant neoplasm of lateral wall of urinary bladder Patient has history of bladder cancer receiving intravesical BCG; has received 2 out of 3 treatments follow-up with urology as outpatient

## 2022-09-25 NOTE — Discharge Summary (Signed)
Physician Discharge Summary   Patient: Trevor [name] MRN: [id number]  DOB: 09/14/1946   Admit:     Date of Admission: 09/22/2022 Admitted from: home   Discharge: Date of discharge: 09/25/22 Disposition: Home Condition at discharge: good  CODE STATUS: DNR     Discharge Physician: Emeterio Reeve, DO [facility]     PCP: Derinda Late, MD  Recommendations for Outpatient Follow-up:  Follow up with PCP Derinda Late, MD in 1 weeks Please obtain labs/tests: consider echocardiogram to eval for brief run VTach (asymptomatic caught on tele, resolved spontaneously after 8 beats)  Please follow up on the following pending results: none PCP AND OTHER OUTPATIENT PROVIDERS: SEE BELOW FOR SPECIFIC DISCHARGE INSTRUCTIONS PRINTED FOR PATIENT IN ADDITION TO GENERIC AVS PATIENT INFO     Discharge Instructions     Call MD for:  difficulty breathing, headache or visual disturbances   Complete by: As directed    Call MD for:  extreme fatigue   Complete by: As directed    Call MD for:  persistant dizziness or light-headedness   Complete by: As directed    Call MD for:  temperature >100.4   Complete by: As directed    Diet - low sodium heart healthy   Complete by: As directed    Discharge instructions   Complete by: As directed    You have been treated for sepsis due to a UTI - you are being discharged home with antibiotics to finish 3 more days of treatment with ciprofloxacin.   You had a brief episode on the heart monitor of something called Ventricular Tachycardia aka V-Tach. Ideally, we'd like to get an echocardiogram (heart ultrasound) to further evaluate but there is minimal risk if you decide not to get this test here - you have decided to forego this test in the hospital. We have started a medication called Carvedilol to help control heart rate. Please follow up on this with your outpatient primary care team to see if you should continue this medication.   Increase  activity slowly   Complete by: As directed          Discharge Diagnoses: Principal Problem:   Sepsis secondary to UTI [facility]) Active Problems:   AKI (acute kidney injury) ([place])   Malignant neoplasm of lateral wall of urinary bladder (HCC)   Essential hypertension   Hypokalemia   Depression   Gastritis       Hospital Course: 76 year old male with medical history for bladder cancer receiving intravesical BCG treatment, last treatment was on 09/13/2022, history of depression, GERD, hypertension, chronic low back pain presented to the ER 09/22/2022 via private vehicle for evaluation of weakness and feeling unwell for the past 4 days.  Also complains of abdominal pain in the periumbilical area along with anorexia, nausea but no vomiting. He was placed on nitrofurantoin for UTI and has not been able to take it more than 3 days because he was not feeling well. He also had macular rash developed 3 days prior to hospitalization 10/12: in the ED blood pressure was 74/50, tachycardia, WBC 18,000 = sepsis d/t UTI. Patient received 2.5 L of fluid bolus, Benadryl, Solu-Medrol, EpiPen for presumed anaphylactic reaction. He also received a dose of Azactam and vancomycin. Admitted for sepsis and AKI 10/13: Sepsis parameters resolved. Continue IV abx pending cultures - no UCx at time of admission, awaiting BCx. Outpatient UCx (+)Enterococcus faecalis.  10/14: nonsustained Vtach on telemetry this morning, Echo pending, started Coreg 10/15: BCx  NGx3d. Echo pending, in shared decision making discussion risks/benefits, pt opts to forego echocardiogram prior to discharge (ordered for brief run [place]) and will follow outpatient on this issue.   Consultants:  none  Procedures: none      ASSESSMENT & PLAN:   Principal Problem:   Sepsis secondary to UTI [facility]) Active Problems:   AKI (acute kidney injury) ([place])   Malignant neoplasm of lateral wall of urinary bladder (HCC)   Essential hypertension    Hypokalemia   Depression   Gastritis   Sepsis due to UTI - sepsis resolved Complicated UTI  -Presented with hypotension; there is wanted to IV fluid challenge -Sepsis physiology has resolved; WBC is down to 8.9 -Also has acute kidney injury due to hypotension and sepsis -Patient had urine culture positive for Enterococcus faecalis and was placed on Macrobid as outpatient Currently placed on vancomycin and Azactam; no urine culture was obtained at the time of admission Follow blood culture results --> NG x3 days  PO abx on discharge w/ cipro for complicated UTI   Acute kidney injury - resolved from ATN from hypotension due to sepsis Patient has baseline creatinine of 1.05; presented with creatinine of 2.47 Treated w/ IV fluids which have been d/c after normalization of Cr  Hold HCTZ/lisinopril, hold NSAID Follow BMP   Nonsustained V. Tach Had 8 beat run of nonsustained V. tach yesterday morning EKG obtained was unremarkable Continue telemetry  Check echocardiogram - pt decided to defer this see above  start Coreg 3.125 mg p.o. twice daily Serum magnesium 1.7 yesterday, gave 1 g of magnesium sulfate IV  Hypotension on admission d/t Sepsis - hypotension resolved  Essential Hypertension Blood pressure improved with IV fluids Continue to hold lisinopril/HCTZ d/t AKI --> restarted on discharge  Continue beta blocker Coreg 3.125 mg p.o. twice daily  Hypomagnesemia  Serum magnesium 1.7 yesterday, gave 1 g of magnesium sulfate IV   Gastritis Continue Protonix 40 mg daily   Depression Continue Remeron, trazodone   Hypokalemia - resolved  Secondary to diuretic use Repleted Follow bMP   Malignant neoplasm of lateral wall of urinary bladder Patient has history of bladder cancer receiving intravesical BCG; has received 2 out of 3 treatments follow-up with urology as outpatient              Discharge Instructions  Allergies as of 09/25/2022       Reactions    Ceftriaxone Hives   Tolerated 1st generation cephalosporin (CEFAZOLIN) on 06/20/2018 and 09/11/2019 without documented ADRs. Previously tolerated amoxicillin Blisters and Hives around mouth and neck.  Some blistering of lips.  Mouth not effected.  Patient started on ceftriaxone and doxycyline 1-2 days prior to reaction.     Doxycycline Hives   Blisters and Hives around mouth and neck.  Some blistering of lips.  Mouth not effected. Patient started on ceftriaxone and doxycyline 1-2 days prior to reaction.     Macrobid [nitrofurantoin] Rash        Medication List     STOP taking these medications    benzonatate 200 MG capsule Commonly known as: TESSALON   methocarbamol 500 MG tablet Commonly known as: ROBAXIN   nitrofurantoin (macrocrystal-monohydrate) 100 MG capsule Commonly known as: MACROBID   OVER THE COUNTER MEDICATION   triamcinolone cream 0.1 % Commonly known as: KENALOG   valACYclovir 1000 MG tablet Commonly known as: VALTREX       TAKE these medications    acetaminophen 650 MG CR tablet Commonly known as: TYLENOL  Take 1,300 mg by mouth every 8 (eight) hours as needed for pain.   aspirin EC 81 MG tablet Take 81 mg by mouth daily.   carvedilol 3.125 MG tablet Commonly known as: COREG Take 1 tablet (3.125 mg total) by mouth 2 (two) times daily with a meal.   ciprofloxacin 500 MG tablet Commonly known as: CIPRO Take 1 tablet (500 mg total) by mouth 2 (two) times daily for 3 days.   cyanocobalamin 500 MCG tablet Commonly known as: VITAMIN B12 Take 500 mcg by mouth daily.   fluticasone 50 MCG/ACT nasal spray Commonly known as: FLONASE Place 2 sprays into both nostrils daily as needed for allergies.   gabapentin 300 MG capsule Commonly known as: NEURONTIN Take 300 mg by mouth 2 (two) times daily.   lisinopril-hydrochlorothiazide 20-25 MG tablet Commonly known as: ZESTORETIC Take 0.5 tablets by mouth daily.   mirtazapine 15 MG tablet Commonly known  as: REMERON Take 15 mg by mouth at bedtime.   multivitamin with minerals Tabs tablet Take 1 tablet by mouth daily.   naproxen sodium 220 MG tablet Commonly known as: ALEVE Take 220 mg by mouth 2 (two) times daily as needed (pain).   Omeprazole 20 MG Tbec Take 20 mg by mouth daily.   senna-docusate 8.6-50 MG tablet Commonly known as: Senokot-S Take 2 tablets by mouth at bedtime as needed for mild constipation. What changed:  when to take this reasons to take this   simvastatin 40 MG tablet Commonly known as: ZOCOR Take 40 mg by mouth daily.   tamsulosin 0.4 MG Caps capsule Commonly known as: FLOMAX TAKE 1 CAPSULE DAILY   traZODone 100 MG tablet Commonly known as: DESYREL Take 100 mg by mouth at bedtime.          Allergies  Allergen Reactions   Ceftriaxone Hives    Tolerated 1st generation cephalosporin (CEFAZOLIN) on 06/20/2018 and 09/11/2019 without documented ADRs. Previously tolerated amoxicillin  Blisters and Hives around mouth and neck.  Some blistering of lips.  Mouth not effected.  Patient started on ceftriaxone and doxycyline 1-2 days prior to reaction.     Doxycycline Hives    Blisters and Hives around mouth and neck.  Some blistering of lips.  Mouth not effected. Patient started on ceftriaxone and doxycyline 1-2 days prior to reaction.     Macrobid [Nitrofurantoin] Rash     Subjective: pt feels well this morning and asks about going home. No CP/SOB, no N/V, no HA?VC< tolerating diet no abdominal pain    Discharge Exam: BP (!) 134/92 (BP Location: Left Arm)   Pulse 97   Temp 98.1 F (36.7 C) (Oral)   Resp 18   Ht '5\' 7"'$  (1.702 m)   Wt 81.6 kg   SpO2 98%   BMI 28.18 kg/m  General: Pt is alert, awake, not in acute distress Cardiovascular: RRR, S1/S2 +, no rubs, no gallops Respiratory: CTA bilaterally, no wheezing, no rhonchi Abdominal: Soft, NT, ND, bowel sounds + Extremities: no edema, no cyanosis     The results of significant  diagnostics from this hospitalization (including imaging, microbiology, ancillary and laboratory) are listed below for reference.     Microbiology: Recent Results (from the past 240 hour(s))  Resp Panel by RT-PCR (Flu A&B, Covid) Anterior Nasal Swab     Status: None   Collection Time: 09/22/22  7:54 AM   Specimen: Anterior Nasal Swab  Result Value Ref Range Status   SARS Coronavirus 2 by RT PCR NEGATIVE NEGATIVE Final  Comment: (NOTE) SARS-CoV-2 target nucleic acids are NOT DETECTED.  The SARS-CoV-2 RNA is generally detectable in upper respiratory specimens during the acute phase of infection. The lowest concentration of SARS-CoV-2 viral copies this assay can detect is 138 copies/mL. A negative result does not preclude SARS-Cov-2 infection and should not be used as the sole basis for treatment or other patient management decisions. A negative result may occur with  improper specimen collection/handling, submission of specimen other than nasopharyngeal swab, presence of viral mutation(s) within the areas targeted by this assay, and inadequate number of viral copies(<138 copies/mL). A negative result must be combined with clinical observations, patient history, and epidemiological information. The expected result is Negative.  Fact Sheet for Patients:  EntrepreneurPulse.com.au  Fact Sheet for Healthcare Providers:  IncredibleEmployment.be  This test is no t yet approved or cleared by the [place] FDA and  has been authorized for detection and/or diagnosis of SARS-CoV-2 by FDA under an Emergency Use Authorization (EUA). This EUA will remain  in effect (meaning this test can be used) for the duration of the COVID-19 declaration under Section 564(b)(1) of the Act, 21 U.S.C.section 360bbb-3(b)(1), unless the authorization is terminated  or revoked sooner.       Influenza A by PCR NEGATIVE NEGATIVE Final   Influenza B by PCR NEGATIVE  NEGATIVE Final    Comment: (NOTE) The Xpert Xpress SARS-CoV-2/FLU/RSV plus assay is intended as an aid in the diagnosis of influenza from Nasopharyngeal swab specimens and should not be used as a sole basis for treatment. Nasal washings and aspirates are unacceptable for Xpert Xpress SARS-CoV-2/FLU/RSV testing.  Fact Sheet for Patients: EntrepreneurPulse.com.au  Fact Sheet for Healthcare Providers: IncredibleEmployment.be  This test is not yet approved or cleared by the [place] FDA and has been authorized for detection and/or diagnosis of SARS-CoV-2 by FDA under an Emergency Use Authorization (EUA). This EUA will remain in effect (meaning this test can be used) for the duration of the COVID-19 declaration under Section 564(b)(1) of the Act, 21 U.S.C. section 360bbb-3(b)(1), unless the authorization is terminated or revoked.  Performed at [facility], [address] [place], [place] [address]   Culture, blood (single)     Status: None (Preliminary result)   Collection Time: 09/22/22  9:15 AM   Specimen: BLOOD LEFT ARM  Result Value Ref Range Status   Specimen Description BLOOD LEFT ARM  Final   Special Requests   Final    BOTTLES DRAWN AEROBIC AND ANAEROBIC Blood Culture results may not be optimal due to an inadequate volume of blood received in culture bottles   Culture   Final    NO GROWTH 3 DAYS Performed at [facility], [address], [place], [place] [address]    Report Status PENDING  Incomplete     Labs: BNP (last 3 results) Recent Labs    09/22/22 0753  BNP 74.0   Basic Metabolic Panel: Recent Labs  Lab 09/22/22 0753 09/23/22 0801 09/24/22 0501 09/24/22 1509  NA 137 140 141  --   K 3.2* 3.1* 4.0  --   CL 101 109 111  --   CO2 20* 23 23  --   GLUCOSE 149* 115* 77  --   BUN 47* 36* 28*  --   CREATININE 2.47* 1.27* 1.09  --   CALCIUM 8.5* 8.5* 8.4*  --   MG 1.9  --   --  1.7   Liver  Function Tests: Recent Labs  Lab 09/22/22 0753  AST 42*  ALT 26  ALKPHOS 67  BILITOT 1.0  PROT 6.8  ALBUMIN 3.4*   Recent Labs  Lab 09/22/22 0753  LIPASE 68*   No results for input(s): "AMMONIA" in the last 168 hours. CBC: Recent Labs  Lab 09/22/22 0753 09/23/22 0801 09/24/22 0501  WBC 18.9* 8.9 6.8  NEUTROABS 16.8*  --   --   HGB 12.4* 9.9* 9.9*  HCT 37.4* 29.1* 29.8*  MCV 86.2 83.4 85.4  PLT 141* 111* 83*   Cardiac Enzymes: No results for input(s): "CKTOTAL", "CKMB", "CKMBINDEX", "TROPONINI" in the last 168 hours. BNP: Invalid input(s): "POCBNP" CBG: No results for input(s): "GLUCAP" in the last 168 hours. D-Dimer No results for input(s): "DDIMER" in the last 72 hours. Hgb A1c No results for input(s): "HGBA1C" in the last 72 hours. Lipid Profile No results for input(s): "CHOL", "HDL", "LDLCALC", "TRIG", "CHOLHDL", "LDLDIRECT" in the last 72 hours. Thyroid function studies No results for input(s): "TSH", "T4TOTAL", "T3FREE", "THYROIDAB" in the last 72 hours.  Invalid input(s): "FREET3" Anemia work up No results for input(s): "VITAMINB12", "FOLATE", "FERRITIN", "TIBC", "IRON", "RETICCTPCT" in the last 72 hours. Urinalysis    Component Value Date/Time   COLORURINE YELLOW (A) 09/22/2022 1138   APPEARANCEUR HAZY (A) 09/22/2022 1138   APPEARANCEUR Hazy (A) 09/13/2022 1506   LABSPEC 1.013 09/22/2022 1138   PHURINE 5.0 09/22/2022 1138   GLUCOSEU NEGATIVE 09/22/2022 1138   HGBUR LARGE (A) 09/22/2022 1138   BILIRUBINUR NEGATIVE 09/22/2022 1138   BILIRUBINUR Negative 09/13/2022 1506   KETONESUR NEGATIVE 09/22/2022 1138   PROTEINUR NEGATIVE 09/22/2022 1138   NITRITE NEGATIVE 09/22/2022 1138   LEUKOCYTESUR NEGATIVE 09/22/2022 1138   Sepsis Labs Recent Labs  Lab 09/22/22 0753 09/23/22 0801 09/24/22 0501  WBC 18.9* 8.9 6.8   Microbiology Recent Results (from the past 240 hour(s))  Resp Panel by RT-PCR (Flu A&B, Covid) Anterior Nasal Swab     Status: None    Collection Time: 09/22/22  7:54 AM   Specimen: Anterior Nasal Swab  Result Value Ref Range Status   SARS Coronavirus 2 by RT PCR NEGATIVE NEGATIVE Final    Comment: (NOTE) SARS-CoV-2 target nucleic acids are NOT DETECTED.  The SARS-CoV-2 RNA is generally detectable in upper respiratory specimens during the acute phase of infection. The lowest concentration of SARS-CoV-2 viral copies this assay can detect is 138 copies/mL. A negative result does not preclude SARS-Cov-2 infection and should not be used as the sole basis for treatment or other patient management decisions. A negative result may occur with  improper specimen collection/handling, submission of specimen other than nasopharyngeal swab, presence of viral mutation(s) within the areas targeted by this assay, and inadequate number of viral copies(<138 copies/mL). A negative result must be combined with clinical observations, patient history, and epidemiological information. The expected result is Negative.  Fact Sheet for Patients:  EntrepreneurPulse.com.au  Fact Sheet for Healthcare Providers:  IncredibleEmployment.be  This test is no t yet approved or cleared by the [place] FDA and  has been authorized for detection and/or diagnosis of SARS-CoV-2 by FDA under an Emergency Use Authorization (EUA). This EUA will remain  in effect (meaning this test can be used) for the duration of the COVID-19 declaration under Section 564(b)(1) of the Act, 21 U.S.C.section 360bbb-3(b)(1), unless the authorization is terminated  or revoked sooner.       Influenza A by PCR NEGATIVE NEGATIVE Final   Influenza B by PCR NEGATIVE NEGATIVE Final    Comment: (NOTE) The Xpert Xpress  SARS-CoV-2/FLU/RSV plus assay is intended as an aid in the diagnosis of influenza from Nasopharyngeal swab specimens and should not be used as a sole basis for treatment. Nasal washings and aspirates are unacceptable for  Xpert Xpress SARS-CoV-2/FLU/RSV testing.  Fact Sheet for Patients: EntrepreneurPulse.com.au  Fact Sheet for Healthcare Providers: IncredibleEmployment.be  This test is not yet approved or cleared by the [place] FDA and has been authorized for detection and/or diagnosis of SARS-CoV-2 by FDA under an Emergency Use Authorization (EUA). This EUA will remain in effect (meaning this test can be used) for the duration of the COVID-19 declaration under Section 564(b)(1) of the Act, 21 U.S.C. section 360bbb-3(b)(1), unless the authorization is terminated or revoked.  Performed at [facility], [address] [place], [place] [address]   Culture, blood (single)     Status: None (Preliminary result)   Collection Time: 09/22/22  9:15 AM   Specimen: BLOOD LEFT ARM  Result Value Ref Range Status   Specimen Description BLOOD LEFT ARM  Final   Special Requests   Final    BOTTLES DRAWN AEROBIC AND ANAEROBIC Blood Culture results may not be optimal due to an inadequate volume of blood received in culture bottles   Culture   Final    NO GROWTH 3 DAYS Performed at [facility], [address] [place], [place] [address]    Report Status PENDING  Incomplete   Imaging CT ABDOMEN PELVIS WO CONTRAST  Result Date: 09/22/2022 CLINICAL DATA:  Abdominal pain, nausea, vomiting EXAM: CT ABDOMEN AND PELVIS WITHOUT CONTRAST TECHNIQUE: Multidetector CT imaging of the abdomen and pelvis was performed following the standard protocol without IV contrast. RADIATION DOSE REDUCTION: This exam was performed according to the departmental dose-optimization program which includes automated exposure control, adjustment of the mA and/or kV according to patient size and/or use of iterative reconstruction technique. COMPARISON:  10/27/2021 FINDINGS: Lower chest: Extensive coronary artery calcifications are seen. Increased interstitial markings are seen in the  periphery of both lower lung fields suggesting scarring from chronic interstitial lung disease. Small bilateral pleural effusions are seen. Hepatobiliary: There is mild nodularity of the liver surface. No focal abnormalities are seen. There is no dilation of bile ducts. Gallbladder is unremarkable. Pancreas: There is atrophy.  No focal abnormalities are seen. Spleen: Spleen measures 13.1 cm in maximum diameter. Small calcifications are seen. Adrenals/Urinary Tract: Adrenals are unremarkable. There is no hydronephrosis. There are no renal or ureteral stones. Urinary bladder is unremarkable. Stomach/Bowel: Stomach is unremarkable. Small bowel loops are not dilated. Appendix is not dilated. There is no significant wall thickening in colon. Vascular/Lymphatic: Extensive arterial calcifications are seen. There is ectasia of infrarenal aorta measuring 2.3 cm. Reproductive: Prostate appears smaller than usual in size. Other: There is no ascites or pneumoperitoneum. Umbilical hernia containing fat is seen. Musculoskeletal: Degenerative changes are noted in lumbar spine. There is surgical fusion at multiple levels in lumbar spine from L2-S1 levels. IMPRESSION: There is no evidence of intestinal obstruction or pneumoperitoneum. There is no hydronephrosis. New small bilateral pleural effusions. Increased markings in the periphery of lower lung fields may suggest scarring from chronic interstitial lung disease. Extensive coronary artery calcifications are seen. Cirrhosis of liver.  Spleen is enlarged.  Lumbar spondylosis. Other findings as described in the body of the report. Electronically Signed   By: Elmer Picker M.D.   On: 09/22/2022 09:20   DG Chest Portable 1 View  Result Date: 09/22/2022 CLINICAL DATA:  Weakness and shortness of breath EXAM:  PORTABLE CHEST 1 VIEW COMPARISON:  Chest x-ray dated January 10, 2022 FINDINGS: Cardiac and mediastinal contours within normal limits. Mild left basilar atelectasis. No  large pleural effusion or evidence of pneumothorax. IMPRESSION: No active disease. Electronically Signed   By: Yetta Glassman M.D.   On: 09/22/2022 08:09      Time coordinating discharge: over 30 minutes  SIGNED:  Emeterio Reeve DO [facility]

## 2022-09-27 ENCOUNTER — Ambulatory Visit: Payer: Medicare Other | Admitting: Urology

## 2022-09-27 LAB — CULTURE, BLOOD (SINGLE): Culture: NO GROWTH

## 2022-10-04 ENCOUNTER — Encounter: Payer: Self-pay | Admitting: Physician Assistant

## 2022-10-04 ENCOUNTER — Ambulatory Visit (INDEPENDENT_AMBULATORY_CARE_PROVIDER_SITE_OTHER): Payer: Medicare Other | Admitting: Physician Assistant

## 2022-10-04 DIAGNOSIS — D090 Carcinoma in situ of bladder: Secondary | ICD-10-CM

## 2022-10-04 DIAGNOSIS — R310 Gross hematuria: Secondary | ICD-10-CM

## 2022-10-04 MED ORDER — BCG LIVE 50 MG IS SUSR: 3.2400 mL | Freq: Once | INTRAVESICAL | Status: DC

## 2022-10-04 NOTE — Progress Notes (Signed)
NO CHARGE VISIT. NO BCG ADMINISTERED.  Patient presented today for maintenance BCG #3/3. He was hospitalized with urosepsis after his last treatment and his symptoms resolved after receiving Azactam, vancomycin, and Cipro. I discussed with Dr. Erlene Quan, who recommends foregoing today's treatment. Patient agrees. He left clinic today without receiving intravesical BCG. He understands to follow up at scheduled follow-up cystoscopy with Dr. Erlene Quan in January 2024.

## 2022-10-05 LAB — URINALYSIS, COMPLETE
Bilirubin, UA: NEGATIVE
Glucose, UA: NEGATIVE
Ketones, UA: NEGATIVE
Leukocytes,UA: NEGATIVE
Nitrite, UA: NEGATIVE
Specific Gravity, UA: 1.02 (ref 1.005–1.030)
Urobilinogen, Ur: 0.2 mg/dL (ref 0.2–1.0)
pH, UA: 5.5 (ref 5.0–7.5)

## 2022-10-05 LAB — MICROSCOPIC EXAMINATION: RBC, Urine: >30 /hpf — AB (ref 0–2)

## 2022-10-31 ENCOUNTER — Telehealth: Payer: Self-pay | Admitting: *Deleted

## 2022-10-31 NOTE — Telephone Encounter (Signed)
Dr. Erlene Quan, do you want to move up his surveillance cysto given that we couldn't complete his mBCG series?

## 2022-10-31 NOTE — Telephone Encounter (Signed)
Prefer not to move up cystoscopy appointment as he did receive 2 doses of BCG which causes bladder inflammation and redness   Please do offer him for same or next appointment to rule out urinary tract infection especially if he is having any other associated symptoms like urgency, frequency, or burning.  Hollice Espy, MD

## 2022-10-31 NOTE — Telephone Encounter (Signed)
Patient called in today and left a voice stating he has been having blood for a week now . He wants to know if he needs to be seen sooner

## 2022-11-01 NOTE — Telephone Encounter (Signed)
Spoke with patient and he is not having any urgency, frequency, burning, fever or abdominal pain. Pt states it's just blood and he wanted to make Dr. Erlene Quan aware, pt declined appt. Pt states he doesn't have a UTI.

## 2022-11-07 ENCOUNTER — Ambulatory Visit (INDEPENDENT_AMBULATORY_CARE_PROVIDER_SITE_OTHER): Payer: Medicare Other | Admitting: Physician Assistant

## 2022-11-07 ENCOUNTER — Telehealth: Payer: Self-pay

## 2022-11-07 VITALS — BP 150/72 | HR 62 | Ht 67.0 in | Wt 179.0 lb

## 2022-11-07 DIAGNOSIS — R310 Gross hematuria: Secondary | ICD-10-CM

## 2022-11-07 MED ORDER — FINASTERIDE 5 MG PO TABS: 5.0000 mg | ORAL_TABLET | Freq: Every day | ORAL | 11 refills | Status: DC

## 2022-11-07 NOTE — Progress Notes (Unsigned)
11/07/2022 4:38 PM   Trevor [name] 04/22/1946 [id number]  CC: Chief Complaint  Patient presents with   Urinary Retention   HPI: Trevor STUEVE is a 76 y.o. male with PMH elevated PSA, BPH with LUTS, and bladder cancer who recently completed maintenance BCG x 2/3 who presents today for evaluation of recurrent gross hematuria.   Today he reports he had gross hematuria early last week, which he contacted our office about.  He declined an appointment, and the gross hematuria resolved on its own 4 days ago.  It resumed yesterday and he felt he was passing some small pieces of tissue/clots.  He reports some obstructive urinary symptoms associated with debris passage.  In-office UA today positive for trace glucose, 1+ ketones, 3+ blood, and 3+ protein; urine microscopy with >30 RBCs/HPF. PVR 30m.  PMH: Past Medical History:  Diagnosis Date   Acne    Allergic rhinitis    Anemia    Anxiety    BPH (benign prostatic hypertrophy) with urinary obstruction    Cancer (HCC)    bladder CA; remission since Oct 2014   Carpal tunnel syndrome    Cervicalgia    Degenerative disc disease, cervical    neck and back;    Depression    controlled;    Gastritis    GERD (gastroesophageal reflux disease)    Hemorrhoids    History of malignant neoplasm of bladder    Hyperlipidemia    Hypertension    controlled with medication;    Hypospadias    Low back pain    Mitral regurgitation    Pre-diabetes    Sleep apnea    Urinary frequency     Surgical History: Past Surgical History:  Procedure Laterality Date   ADENOIDECTOMY     ANTERIOR FUSION LUMBAR SPINE  11/20/2017   multiple levels; Dr. YIzora Ribas  ANTERIOR LAT LUMBAR FUSION N/A 09/11/2019   Procedure: PRONE XLIF L2-3;  Surgeon: YMeade Maw MD;  Location: [facility];  Service: Neurosurgery;  Laterality: N/A;   BLADDER SURGERY     2011, 2013, 2015   COLONOSCOPY WITH PROPOFOL N/A 07/31/2015   Procedure: COLONOSCOPY WITH PROPOFOL;   Surgeon: RManya Silvas MD;  [facility];  Service: Endoscopy;  Laterality: N/A;   COLONOSCOPY, ESOPHAGOGASTRODUODENOSCOPY (EGD) AND ESOPHAGEAL DILATION  2006   CYSTOSCOPY W/ RETROGRADES Bilateral 11/11/2021   Procedure: CYSTOSCOPY WITH RETROGRADE PYELOGRAM;  Surgeon: BHollice Espy MD;  Location: [facility];  Service: Urology;  Laterality: Bilateral;   CYSTOSCOPY WITH BIOPSY N/A 11/11/2021   Procedure: CYSTOSCOPY WITH BLADDER BIOPSY;  Surgeon: BHollice Espy MD;  Location: [facility];  Service: Urology;  Laterality: N/A;   [place]  LUMBAR LAMINECTOMY/DECOMPRESSION MICRODISCECTOMY N/A 06/20/2018   Procedure: LUMBAR LAMINECTOMY/DECOMPRESSION MICRODISCECTOMY 1 LEVEL-L-2-3;  Surgeon: YMeade Maw MD;  Location: [facility];  Service: Neurosurgery;  Laterality: N/A;   POSTERIOR FUSION LUMBAR SPINE  10/02/2019   L2-4   POSTERIOR LUMBAR FUSION 4 WITH HARDWARE REMOVAL N/A 09/11/2019   Procedure: L2-S1 POSTERIOR FUSION, ABORTED;  Surgeon: YMeade Maw MD;  Location: [facility];  Service: Neurosurgery;  Laterality: N/A;   SEPTOPLASTY  1996   TONSILLECTOMY      Home Medications:  Allergies as of 11/07/2022       Reactions   Ceftriaxone Hives   Tolerated 1st generation cephalosporin (CEFAZOLIN) on 06/20/2018 and 09/11/2019 without documented ADRs. Previously tolerated amoxicillin Blisters and Hives around mouth and neck.  Some blistering of lips.  Mouth not  effected.  Patient started on ceftriaxone and doxycyline 1-2 days prior to reaction.     Doxycycline Hives   Blisters and Hives around mouth and neck.  Some blistering of lips.  Mouth not effected. Patient started on ceftriaxone and doxycyline 1-2 days prior to reaction.     Macrobid [nitrofurantoin] Rash        Medication List        Accurate as of November 07, 2022  4:38 PM. If you have any questions, ask your nurse or doctor.          acetaminophen 650 MG CR tablet Commonly known as:  TYLENOL Take 1,300 mg by mouth every 8 (eight) hours as needed for pain.   aspirin EC 81 MG tablet Take 81 mg by mouth daily.   carvedilol 3.125 MG tablet Commonly known as: COREG Take 1 tablet (3.125 mg total) by mouth 2 (two) times daily with a meal.   cyanocobalamin 500 MCG tablet Commonly known as: VITAMIN B12 Take 500 mcg by mouth daily.   finasteride 5 MG tablet Commonly known as: PROSCAR Take 1 tablet (5 mg total) by mouth daily. Started by: Debroah Loop, PA-C   fluticasone 50 MCG/ACT nasal spray Commonly known as: FLONASE Place 2 sprays into both nostrils daily as needed for allergies.   gabapentin 300 MG capsule Commonly known as: NEURONTIN Take 300 mg by mouth 2 (two) times daily.   lisinopril-hydrochlorothiazide 20-25 MG tablet Commonly known as: ZESTORETIC Take 0.5 tablets by mouth daily.   mirtazapine 15 MG tablet Commonly known as: REMERON Take 15 mg by mouth at bedtime.   multivitamin with minerals Tabs tablet Take 1 tablet by mouth daily.   naproxen sodium 220 MG tablet Commonly known as: ALEVE Take 220 mg by mouth 2 (two) times daily as needed (pain).   Omeprazole 20 MG Tbec Take 20 mg by mouth daily.   senna-docusate 8.6-50 MG tablet Commonly known as: Senokot-S Take 2 tablets by mouth at bedtime as needed for mild constipation.   simvastatin 40 MG tablet Commonly known as: ZOCOR Take 40 mg by mouth daily.   tamsulosin 0.4 MG Caps capsule Commonly known as: FLOMAX TAKE 1 CAPSULE DAILY   traZODone 100 MG tablet Commonly known as: DESYREL Take 100 mg by mouth at bedtime.        Allergies:  Allergies  Allergen Reactions   Ceftriaxone Hives    Tolerated 1st generation cephalosporin (CEFAZOLIN) on 06/20/2018 and 09/11/2019 without documented ADRs. Previously tolerated amoxicillin  Blisters and Hives around mouth and neck.  Some blistering of lips.  Mouth not effected.  Patient started on ceftriaxone and doxycyline 1-2 days  prior to reaction.     Doxycycline Hives    Blisters and Hives around mouth and neck.  Some blistering of lips.  Mouth not effected. Patient started on ceftriaxone and doxycyline 1-2 days prior to reaction.     Macrobid [Nitrofurantoin] Rash    Family History: Family History  Problem Relation Age of Onset   Hyperlipidemia Brother    Hyperlipidemia Sister    Heart attack Father     Social History:   reports that he has never smoked. He has never used smokeless tobacco. He reports that he does not drink alcohol and does not use drugs.  Physical Exam: BP (!) 150/72   Pulse 62   Ht '5\' 7"'$  (1.702 m)   Wt 179 lb (81.2 kg)   BMI 28.04 kg/m   Constitutional:  Alert and oriented, no acute  distress, nontoxic appearing HEENT: [place], AT Cardiovascular: No clubbing, cyanosis, or edema Respiratory: Normal respiratory effort, no increased work of breathing Skin: No rashes, bruises or suspicious lesions Neurologic: Grossly intact, no focal deficits, moving all 4 extremities Psychiatric: Normal mood and affect  Laboratory Data: Results for orders placed or performed in visit on 10/04/22  Microscopic Examination   Urine  Result Value Ref Range   WBC, UA 0-5 0 - 5 /hpf   RBC, Urine >30 (A) 0 - 2 /hpf   Epithelial Cells (non renal) 0-10 0 - 10 /hpf   Bacteria, UA Few None seen/Few  Urinalysis, Complete  Result Value Ref Range   Specific Gravity, UA 1.020 1.005 - 1.030   pH, UA 5.5 5.0 - 7.5   Color, UA Yellow Yellow   Appearance Ur Clear Clear   Leukocytes,UA Negative Negative   Protein,UA Trace (A) Negative/Trace   Glucose, UA Negative Negative   Ketones, UA Negative Negative   RBC, UA 3+ (A) Negative   Bilirubin, UA Negative Negative   Urobilinogen, Ur 0.2 0.2 - 1.0 mg/dL   Nitrite, UA Negative Negative   Microscopic Examination See below:    Assessment & Plan:   1. Gross hematuria On visual inspection today, his urine is translucent and dark brown consistent with old bleeding.   His bladder is empty, so I am hesitant to catheterize him and attempt irrigation of the bladder lest I worsen his bleeding.  Will check an H&H today and contact him with the results when available tomorrow.  I encouraged him to stay well-hydrated and we discussed warning signs of clinically significant gross hematuria including passage of thick, ketchup like urine, passage of large clots, or the inability to void.  In discussion with Dr. Erlene Quan, we will also start him on finasteride to reduce his risk for prostatic bleeding. - Bladder Scan (Post Void Residual) in office - Hemoglobin and hematocrit, blood - Urinalysis, Complete - finasteride (PROSCAR) 5 MG tablet; Take 1 tablet (5 mg total) by mouth daily.  Dispense: 30 tablet; Refill: 11  Return for Will call with results.  Debroah Loop, PA-C  [facility] [address], [place], [place] [address] [phone]

## 2022-11-07 NOTE — Telephone Encounter (Signed)
Incoming VM on triage line from pt stating he is experiencing heavy bleeding with urination and is only dribbling when he attempts to urinate. Pt states he is in pain. Per Sam, pt needs to come in and be evaluated. Added pt to schedule, pt expressed understanding.

## 2022-11-08 ENCOUNTER — Telehealth: Payer: Self-pay | Admitting: *Deleted

## 2022-11-08 LAB — URINALYSIS, COMPLETE
Bilirubin, UA: NEGATIVE
Leukocytes,UA: NEGATIVE
Nitrite, UA: NEGATIVE
Specific Gravity, UA: 1.025 (ref 1.005–1.030)
Urobilinogen, Ur: 0.2 mg/dL (ref 0.2–1.0)
pH, UA: 5 (ref 5.0–7.5)

## 2022-11-08 LAB — MICROSCOPIC EXAMINATION: RBC, Urine: >30 /hpf — AB (ref 0–2)

## 2022-11-08 LAB — HEMOGLOBIN AND HEMATOCRIT, BLOOD
Hematocrit: 32.9 % — ABNORMAL LOW (ref 37.5–51.0)
Hemoglobin: 10.8 g/dL — ABNORMAL LOW (ref 13.0–17.7)

## 2022-11-08 NOTE — Telephone Encounter (Signed)
-----   Message from [place], [place] sent at 11/08/2022  1:57 PM EST ----- Blood counts are improved over prior, excellent news, I'm not worried he's losing a dangerous amount of blood in his urine.  I spoke with Dr. Erlene Quan about him yesterday, and we recommend starting finasteride to reduce his risk of bleeding from the prostate. This medication can take several months to reach the desired effect, but we feel it will be beneficial for him in the long run in terms of his gross hematuria. I sent a 1-year supply of this medication to his pharmacy yesterday and he may pick it up at his convenience. I do want him to be aware of the risk of erectile dysfunction on this medication, as this is the most common side effect.

## 2022-11-08 NOTE — Telephone Encounter (Signed)
.  left message to have patient return my call.  

## 2022-11-09 NOTE — Telephone Encounter (Signed)
Notified patient as instructed, patient pleased °

## 2022-12-01 ENCOUNTER — Telehealth: Payer: Self-pay

## 2022-12-01 DIAGNOSIS — N400 Enlarged prostate without lower urinary tract symptoms: Secondary | ICD-10-CM

## 2022-12-01 MED ORDER — TAMSULOSIN HCL 0.4 MG PO CAPS: 0.4000 mg | ORAL_CAPSULE | Freq: Every day | ORAL | 3 refills | Status: DC

## 2022-12-01 NOTE — Telephone Encounter (Signed)
Patient is requesting a refill of tamsulosin.  Is this still appropriate?

## 2022-12-01 NOTE — Telephone Encounter (Signed)
Yes, 1 year refill sent to [facility] mail order pharmacy.

## 2022-12-09 DIAGNOSIS — I350 Nonrheumatic aortic (valve) stenosis: Secondary | ICD-10-CM | POA: Insufficient documentation

## 2022-12-20 ENCOUNTER — Ambulatory Visit (INDEPENDENT_AMBULATORY_CARE_PROVIDER_SITE_OTHER): Payer: Medicare Other | Admitting: Urology

## 2022-12-20 ENCOUNTER — Encounter: Payer: Self-pay | Admitting: Urology

## 2022-12-20 VITALS — BP 130/74 | HR 78 | Ht 67.0 in | Wt 175.0 lb

## 2022-12-20 DIAGNOSIS — R319 Hematuria, unspecified: Secondary | ICD-10-CM

## 2022-12-20 DIAGNOSIS — Z8551 Personal history of malignant neoplasm of bladder: Secondary | ICD-10-CM | POA: Diagnosis not present

## 2022-12-20 LAB — URINALYSIS, COMPLETE
Specific Gravity, UA: 1.03 (ref 1.005–1.030)
pH, UA: 5 (ref 5.0–7.5)

## 2022-12-20 LAB — MICROSCOPIC EXAMINATION: RBC, Urine: >30 /hpf — AB (ref 0–2)

## 2022-12-20 NOTE — Progress Notes (Unsigned)
   12/20/22 CC:  Chief Complaint  Patient presents with   Cysto    HPI: Trevor [name] is a 77 y.o. malewith a personal history of elevated PSA, BPH with LUTS, and bladder cancer, who presents today for a surveillance cystoscopy.   He was first  diagnosed in 2011, low-grade TA disease identified a gross hematuria work-up.  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  He is s/p cystoscopy, bladder biopsy, and bilateral retrograde pyelogram on 11/11/2021. 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     He completed induction BCG on 02/24/2022 followed by BCG x 1 for maintenance but was aborted due to hematuria.    He reports today that he continues to have almost daily hematuria, light pink in nature.  No clots.  No other changes in his urinary symptoms.  Urinalysis today with RBCs and WBCs, minimal bacteria.  Nitrite negative.  Vitals:   12/20/22 0921  BP: 130/74  Pulse: 78   NED. A&Ox3.   No respiratory distress   Abd soft, NT, ND Normal phallus with bilateral descended testicles  Cystoscopy Procedure Note  Patient identification was confirmed, informed consent was obtained, and patient was prepped using Betadine solution.  Lidocaine jelly was administered per urethral meatus.     Pre-Procedure: - Inspection reveals coronal hypospadias  Procedure: The flexible cystoscope was introduced without difficulty.  Distally, visualization was poor but upon aspirating all of his blood-tinged urine and filling with clear saline, visualization was significantly improved. - No urethral strictures/lesions are present. - Normal prostate  - Normal bladder neck - Bilateral ureteral orifices identified - Bladder mucosa  reveals an area on the  right lateral bladder wall which was slightly texturized change less than 1 cm but no obvious papillary change.  Is not particularly erythematous. 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 -No active bleeding noted today -Not enough urine today for culture although based on the absence of symptoms, do not suspect bacterial infection -Urine cytology today -If cytology is positive, recommend proceeding to the operating room for repeat biopsy of this area -If cytology is negative, will plan for repeat cystoscopy in the very near future, 1 month for reassessment as patient prefers to avoid return to OR for if at all possible   Hollice Espy, MD

## 2022-12-21 LAB — CYTOLOGY - NON PAP

## 2022-12-23 ENCOUNTER — Other Ambulatory Visit
Admission: RE | Admit: 2022-12-23 | Discharge: 2022-12-23 | Disposition: A | Discharge status: Home / Self Care | Payer: Medicare Other | Attending: Urology | Admitting: Urology

## 2022-12-23 ENCOUNTER — Telehealth: Payer: Self-pay

## 2022-12-23 ENCOUNTER — Encounter: Payer: Self-pay | Admitting: Urology

## 2022-12-23 ENCOUNTER — Ambulatory Visit (INDEPENDENT_AMBULATORY_CARE_PROVIDER_SITE_OTHER): Payer: Medicare Other | Admitting: Urology

## 2022-12-23 VITALS — BP 118/61 | HR 73 | Ht 67.0 in | Wt 176.0 lb

## 2022-12-23 DIAGNOSIS — R310 Gross hematuria: Secondary | ICD-10-CM

## 2022-12-23 LAB — CBC
HCT: 27.7 % — ABNORMAL LOW (ref 39.0–52.0)
Hemoglobin: 9 g/dL — ABNORMAL LOW (ref 13.0–17.0)
MCH: 27.6 pg (ref 26.0–34.0)
MCHC: 32.5 g/dL (ref 30.0–36.0)
MCV: 85 fL (ref 80.0–100.0)
Platelets: 110 10*3/uL — ABNORMAL LOW (ref 150–400)
RBC: 3.26 MIL/uL — ABNORMAL LOW (ref 4.22–5.81)
RDW: 13.7 % (ref 11.5–15.5)
WBC: 4.3 10*3/uL (ref 4.0–10.5)
nRBC: 0 % (ref 0.0–0.2)

## 2022-12-23 LAB — URINALYSIS, COMPLETE (UACMP) WITH MICROSCOPIC: RBC / HPF: >50 RBC/hpf (ref 0–5)

## 2022-12-23 LAB — BLADDER SCAN AMB NON-IMAGING

## 2022-12-23 NOTE — Telephone Encounter (Signed)
Patient called stating since then he has had harder time urinating-has to push harder to get urine out and when he urinates has "flesh" in the urine and heavier blood flow with urine, he states he had some blood with urination before Cysto but much heavier now.   Spoke with Dr Erlene Quan. Sam is not able to work patient in today. Dr Erlene Quan working patient in today in [place] at 1:15 pm with UA and Urine culture. Patient aware.

## 2022-12-23 NOTE — Progress Notes (Signed)
I, Trevor [name],acting as a scribe for Trevor Espy, MD.,have documented all relevant documentation on the behalf of Trevor Espy, MD,as directed by  Trevor Espy, MD while in the presence of Trevor Espy, MD.   I, Trevor [name],acting as a scribe for Trevor Espy, MD.,have documented all relevant documentation on the behalf of Trevor Espy, MD,as directed by  Trevor Espy, MD while in the presence of Trevor Espy, MD.  12/23/2022 2:10 PM   Trevor [name] 1946/01/09 [id number]  Referring provider: Derinda Late, MD [address] [facility] [place],  [place] [address]  Chief Complaint  Patient presents with   Acute Visit   Dysuria    HPI: 77 year-old male with a personal history of bladder cancer and hematuria, who had a cytoscopy on 12/20/2021 with an area of inflammation versus recurrent tumor.   His urinalysis today has 6-10 WBC's, a few bacteria, and greater than 50 RBC's. There is no evidence of infection as a contributing factor.  He called the office today with increasing difficulty urinating and heavier bleeding presumably passing some small clots. He reports mild lightheadedness and fatigue.  Results for orders placed or performed in visit on 12/23/22  BLADDER SCAN AMB NON-IMAGING  Result Value Ref Range   Scan Result 64m     PMH: Past Medical History:  Diagnosis Date   Acne    Allergic rhinitis    Anemia    Anxiety    BPH (benign prostatic hypertrophy) with urinary obstruction    Cancer (HCC)    bladder CA; remission since Oct 2014   Carpal tunnel syndrome    Cervicalgia    Degenerative disc disease, cervical    neck and back;    Depression    controlled;    Gastritis    GERD (gastroesophageal reflux disease)    Hemorrhoids    History of malignant neoplasm of bladder    Hyperlipidemia    Hypertension    controlled with medication;    Hypospadias    Low back pain    Mitral regurgitation     Pre-diabetes    Sleep apnea    Urinary frequency     Surgical History: Past Surgical History:  Procedure Laterality Date   ADENOIDECTOMY     ANTERIOR FUSION LUMBAR SPINE  11/20/2017   multiple levels; Dr. YIzora Ribas  ANTERIOR LAT LUMBAR FUSION N/A 09/11/2019   Procedure: PRONE XLIF L2-3;  Surgeon: YMeade Maw MD;  Location: [facility];  Service: Neurosurgery;  Laterality: N/A;   BLADDER SURGERY     2011, 2013, 2015   COLONOSCOPY WITH PROPOFOL N/A 07/31/2015   Procedure: COLONOSCOPY WITH PROPOFOL;  Surgeon: RManya Silvas MD;  [facility];  Service: Endoscopy;  Laterality: N/A;   COLONOSCOPY, ESOPHAGOGASTRODUODENOSCOPY (EGD) AND ESOPHAGEAL DILATION  2006   CYSTOSCOPY W/ RETROGRADES Bilateral 11/11/2021   Procedure: CYSTOSCOPY WITH RETROGRADE PYELOGRAM;  Surgeon: BHollice Espy MD;  Location: [facility];  Service: Urology;  Laterality: Bilateral;   CYSTOSCOPY WITH BIOPSY N/A 11/11/2021   Procedure: CYSTOSCOPY WITH BLADDER BIOPSY;  Surgeon: BHollice Espy MD;  Location: [facility];  Service: Urology;  Laterality: N/A;   [place]  LUMBAR LAMINECTOMY/DECOMPRESSION MICRODISCECTOMY N/A 06/20/2018   Procedure: LUMBAR LAMINECTOMY/DECOMPRESSION MICRODISCECTOMY 1 LEVEL-L-2-3;  Surgeon: YMeade Maw MD;  Location: [facility];  Service: Neurosurgery;  Laterality: N/A;   POSTERIOR FUSION LUMBAR SPINE  10/02/2019   L2-4   POSTERIOR LUMBAR FUSION 4 WITH  HARDWARE REMOVAL N/A 09/11/2019   Procedure: L2-S1 POSTERIOR FUSION, ABORTED;  Surgeon: Meade Maw, MD;  Location: [facility];  Service: Neurosurgery;  Laterality: N/A;   SEPTOPLASTY  1996   TONSILLECTOMY      Home Medications:  Allergies as of 12/23/2022       Reactions   Ceftriaxone Hives   Tolerated 1st generation cephalosporin (CEFAZOLIN) on 06/20/2018 and 09/11/2019 without documented ADRs. Previously tolerated amoxicillin Blisters and Hives around mouth and neck.  Some blistering of lips.   Mouth not effected.  Patient started on ceftriaxone and doxycyline 1-2 days prior to reaction.     Doxycycline Hives   Blisters and Hives around mouth and neck.  Some blistering of lips.  Mouth not effected. Patient started on ceftriaxone and doxycyline 1-2 days prior to reaction.     Macrobid [nitrofurantoin] Rash        Medication List        Accurate as of December 23, 2022  2:10 PM. If you have any questions, ask your nurse or doctor.          acetaminophen 650 MG CR tablet Commonly known as: TYLENOL Take 1,300 mg by mouth every 8 (eight) hours as needed for pain.   aspirin EC 81 MG tablet Take 81 mg by mouth daily.   carvedilol 3.125 MG tablet Commonly known as: COREG Take 1 tablet (3.125 mg total) by mouth 2 (two) times daily with a meal.   cyanocobalamin 500 MCG tablet Commonly known as: VITAMIN B12 Take 500 mcg by mouth daily.   finasteride 5 MG tablet Commonly known as: PROSCAR Take 1 tablet (5 mg total) by mouth daily.   fluticasone 50 MCG/ACT nasal spray Commonly known as: FLONASE Place 2 sprays into both nostrils daily as needed for allergies.   gabapentin 300 MG capsule Commonly known as: NEURONTIN Take 300 mg by mouth 2 (two) times daily.   lisinopril-hydrochlorothiazide 20-25 MG tablet Commonly known as: ZESTORETIC Take 0.5 tablets by mouth daily.   mirtazapine 15 MG tablet Commonly known as: REMERON Take 15 mg by mouth at bedtime.   multivitamin with minerals Tabs tablet Take 1 tablet by mouth daily.   naproxen sodium 220 MG tablet Commonly known as: ALEVE Take 220 mg by mouth 2 (two) times daily as needed (pain).   Omeprazole 20 MG Tbec Take 20 mg by mouth daily.   senna-docusate 8.6-50 MG tablet Commonly known as: Senokot-S Take 2 tablets by mouth at bedtime as needed for mild constipation.   simvastatin 40 MG tablet Commonly known as: ZOCOR Take 40 mg by mouth daily.   tamsulosin 0.4 MG Caps capsule Commonly known as:  FLOMAX Take 1 capsule (0.4 mg total) by mouth daily.   traZODone 100 MG tablet Commonly known as: DESYREL Take 100 mg by mouth at bedtime.   valACYclovir 1000 MG tablet Commonly known as: VALTREX Take 4,000 mg by mouth once.        Allergies:  Allergies  Allergen Reactions   Ceftriaxone Hives    Tolerated 1st generation cephalosporin (CEFAZOLIN) on 06/20/2018 and 09/11/2019 without documented ADRs. Previously tolerated amoxicillin  Blisters and Hives around mouth and neck.  Some blistering of lips.  Mouth not effected.  Patient started on ceftriaxone and doxycyline 1-2 days prior to reaction.     Doxycycline Hives    Blisters and Hives around mouth and neck.  Some blistering of lips.  Mouth not effected. Patient started on ceftriaxone and doxycyline 1-2 days prior to reaction.  Macrobid [Nitrofurantoin] Rash    Family History: Family History  Problem Relation Age of Onset   Hyperlipidemia Brother    Hyperlipidemia Sister    Heart attack Father     Social History:  reports that he has never smoked. He has never been exposed to tobacco smoke. He has never used smokeless tobacco. He reports that he does not drink alcohol and does not use drugs.   Physical Exam: BP 118/61   Pulse 73   Ht '5\' 7"'$  (1.702 m)   Wt 176 lb (79.8 kg)   BMI 27.57 kg/m   Constitutional:  Alert and oriented, No acute distress. HEENT: [place] AT, moist mucus membranes.  Trachea midline, no masses. Neurologic: Grossly intact, no focal deficits, moving all 4 extremities. Psychiatric: Normal mood and affect.  Urinalysis    Component Value Date/Time   COLORURINE RED (A) 12/23/2022 1258   APPEARANCEUR CLOUDY (A) 12/23/2022 1258   APPEARANCEUR Cloudy (A) 12/20/2022 0918   LABSPEC  12/23/2022 1258    TEST NOT REPORTED DUE TO COLOR INTERFERENCE OF URINE PIGMENT   PHURINE  12/23/2022 1258    TEST NOT REPORTED DUE TO COLOR INTERFERENCE OF URINE PIGMENT   GLUCOSEU (A) 12/23/2022 1258    TEST NOT  REPORTED DUE TO COLOR INTERFERENCE OF URINE PIGMENT   HGBUR (A) 12/23/2022 1258    TEST NOT REPORTED DUE TO COLOR INTERFERENCE OF URINE PIGMENT   BILIRUBINUR (A) 12/23/2022 1258    TEST NOT REPORTED DUE TO COLOR INTERFERENCE OF URINE PIGMENT   BILIRUBINUR Negative 11/07/2022 1519   KETONESUR (A) 12/23/2022 1258    TEST NOT REPORTED DUE TO COLOR INTERFERENCE OF URINE PIGMENT   PROTEINUR (A) 12/23/2022 1258    TEST NOT REPORTED DUE TO COLOR INTERFERENCE OF URINE PIGMENT   NITRITE (A) 12/23/2022 1258    TEST NOT REPORTED DUE TO COLOR INTERFERENCE OF URINE PIGMENT   LEUKOCYTESUR (A) 12/23/2022 1258    TEST NOT REPORTED DUE TO COLOR INTERFERENCE OF URINE PIGMENT   Assessment & Plan:    Gross Hematuria - No evidence of clot retention, no evidence of urinary tract infection. We will send a urine culture as a precaution. He is feeling mildly fatigued and it is reasonable to get a CBC. We will keep his cystoscopy as scheduled. We will call him if the results are markedly abnormal.   Return for Keep cystoscopy as planned.  [facility] [address], [place], [place] [address] [phone]

## 2022-12-24 LAB — URINE CULTURE: Culture: NO GROWTH

## 2023-01-17 ENCOUNTER — Ambulatory Visit (INDEPENDENT_AMBULATORY_CARE_PROVIDER_SITE_OTHER): Payer: Medicare Other | Admitting: Urology

## 2023-01-17 VITALS — BP 128/66 | HR 94

## 2023-01-17 DIAGNOSIS — Z8551 Personal history of malignant neoplasm of bladder: Secondary | ICD-10-CM | POA: Diagnosis not present

## 2023-01-17 DIAGNOSIS — Z01812 Encounter for preprocedural laboratory examination: Secondary | ICD-10-CM

## 2023-01-17 DIAGNOSIS — R310 Gross hematuria: Secondary | ICD-10-CM

## 2023-01-17 MED ORDER — CEPHALEXIN 250 MG PO CAPS
500.0000 mg | ORAL_CAPSULE | Freq: Once | ORAL | Status: AC
  Administered 2023-01-17: 500 mg via ORAL

## 2023-01-17 NOTE — Progress Notes (Signed)
   01/17/23 CC:  Chief Complaint  Patient presents with   Cysto    HPI: Trevor [name] is a 77 y.o. malewith a personal history of elevated PSA, BPH with LUTS, and bladder cancer, who presents today for a surveillance cystoscopy.   He was first  diagnosed in 2011, low-grade TA disease identified a gross hematuria work-up.  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  He is s/p cystoscopy, bladder biopsy, and bilateral retrograde pyelogram on 11/11/2021. 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     He completed induction BCG on 02/24/2022 followed by BCG x 1 for maintenance but was aborted due to hematuria.    Urinalysis today with RBCs and WBCs, minimal bacteria.  Nitrite negative.  He returns today for reevaluation with cystoscopy.  He continues to have intermittent hematuria.  His urine cytology showed inflammatory type picture.  Urine cultures been negative.  Vitals:   01/17/23 1513  BP: 128/66  Pulse: 94   NED. A&Ox3.   No respiratory distress   Abd soft, NT, ND Normal phallus with bilateral descended testicles  Cystoscopy Procedure Note  Patient identification was confirmed, informed consent was obtained, and patient was prepped using Betadine solution.  Lidocaine jelly was administered per urethral meatus.     Pre-Procedure: - Inspection reveals coronal hypospadias  Procedure: The flexible cystoscope was introduced without difficulty.  Distally, visualization was poor but upon aspirating all of his blood-tinged urine and filling with clear saline, visualization was significantly improved. - No urethral strictures/lesions are present. - Normal prostate  - Normal bladder neck - Bilateral ureteral orifices identified -  Bladder mucosa  reveals an area on the right lateral bladder wall which was slightly texturized change less than 1 cm but no obvious papillary change.  Is not particularly erythematous. 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 -Given persistent change along with ongoing bleeding, I recommended proceeding back to the operating room for biopsy of this area.  We discussed risk and benefits including risk of bleeding, infection, damage surrounding structures amongst others.  All questions were answered.  Preoperative urine culture today.  Hollice Espy, MD

## 2023-01-18 LAB — MICROSCOPIC EXAMINATION: WBC, UA: >30 /hpf — AB (ref 0–5)

## 2023-01-18 LAB — URINALYSIS, COMPLETE
Bilirubin, UA: NEGATIVE
Glucose, UA: NEGATIVE
Ketones, UA: NEGATIVE
Nitrite, UA: NEGATIVE
Protein,UA: NEGATIVE
Specific Gravity, UA: 1.01 (ref 1.005–1.030)
Urobilinogen, Ur: 0.2 mg/dL (ref 0.2–1.0)
pH, UA: 5.5 (ref 5.0–7.5)

## 2023-01-21 LAB — CULTURE, URINE COMPREHENSIVE

## 2023-01-23 ENCOUNTER — Other Ambulatory Visit: Payer: Self-pay | Admitting: Urology

## 2023-01-23 DIAGNOSIS — N329 Bladder disorder, unspecified: Secondary | ICD-10-CM

## 2023-01-23 NOTE — Progress Notes (Unsigned)
Surgical Physician Order Form [facility] [place]  * Scheduling expectation : Next Available  *Length of Case:   *Clearance needed: no  *Anticoagulation Instructions: Hold all anticoagulants  *Aspirin Instructions: Ok to continue Aspirin  *Post-op visit Date/Instructions:   TBD  *Diagnosis: Bladder Lesion  *Procedure: bilateral RTG, Cysto Bladder Biopsy [id number])   Additional orders: N/A  -Admit type: OUTpatient  -Anesthesia: General  -VTE Prophylaxis Standing Order SCD's       Other:   -Standing Lab Orders Per Anesthesia    Lab other: None  -Standing Test orders EKG/Chest x-ray per Anesthesia       Test other:   - Medications:  Ancef 2gm IV  ok with allergy  -Other orders:  N/A

## 2023-01-24 ENCOUNTER — Telehealth: Payer: Self-pay

## 2023-01-24 NOTE — Telephone Encounter (Signed)
I spoke with [name]. We have discussed possible surgery dates and Monday February 19th, 2024 was agreed upon by all parties. Patient given information about surgery date, what to expect pre-operatively and post operatively.  We discussed that a Pre-Admission Testing office will be calling to set up the pre-op visit that will take place prior to surgery, and that these appointments are typically done over the phone with a Pre-Admissions RN. Informed patient that our office will communicate any additional care to be provided after surgery. Patients questions or concerns were discussed during our call. Advised to call our office should there be any additional information, questions or concerns that arise. Patient verbalized understanding.

## 2023-01-24 NOTE — Progress Notes (Addendum)
   [facility] From  Surgery Date: 03/13/2023  Surgeon: Dr. Hollice Espy, MD  Inpt ( No  )   Outpt (Yes)   Obs ( No  )   Diagnosis: N32.9 Bladder Lesion  -CPT: [id number], [id number]  Surgery: Cystoscopy with Bladder Biopsy and Bilateral Retrograde Pyelograms  Stop Anticoagulations: Yes, may continue ASA  Cardiac/Medical/Pulmonary Clearance needed: no  *Orders entered into EPIC  Date: 01/24/23   *Case booked in EPIC  Date: 01/23/2023  *Notified pt of Surgery: Date: 01/23/2023  PRE-OP UA & CX: no  *Placed into Prior Authorization Work Que Date: 01/24/23  Assistant/laser/rep:No

## 2023-01-25 ENCOUNTER — Encounter
Admission: RE | Admit: 2023-01-25 | Discharge: 2023-01-25 | Disposition: A | Discharge status: Home / Self Care | Payer: Medicare Other | Source: Ambulatory Visit | Attending: Urology | Admitting: Urology

## 2023-01-25 VITALS — Ht 67.0 in | Wt 175.9 lb

## 2023-01-25 DIAGNOSIS — I10 Essential (primary) hypertension: Secondary | ICD-10-CM

## 2023-01-25 DIAGNOSIS — I350 Nonrheumatic aortic (valve) stenosis: Secondary | ICD-10-CM

## 2023-01-25 DIAGNOSIS — E876 Hypokalemia: Secondary | ICD-10-CM

## 2023-01-25 DIAGNOSIS — D649 Anemia, unspecified: Secondary | ICD-10-CM

## 2023-01-25 DIAGNOSIS — Z01812 Encounter for preprocedural laboratory examination: Secondary | ICD-10-CM

## 2023-01-25 DIAGNOSIS — D696 Thrombocytopenia, unspecified: Secondary | ICD-10-CM

## 2023-01-25 HISTORY — DX: Other ventricular tachycardia: I47.29

## 2023-01-25 HISTORY — DX: Acute kidney failure, unspecified: N17.9

## 2023-01-25 HISTORY — DX: Thrombocytopenia, unspecified: D69.6

## 2023-01-25 HISTORY — DX: Cardiac murmur, unspecified: R01.1

## 2023-01-25 HISTORY — DX: Hypokalemia: E87.6

## 2023-01-25 HISTORY — DX: Sepsis, unspecified organism: A41.9

## 2023-01-25 HISTORY — DX: Nonrheumatic aortic (valve) stenosis: I35.0

## 2023-01-25 NOTE — Patient Instructions (Signed)
Your procedure is scheduled on:01-30-23 Monday Report to the Registration Desk on the 1st floor of the [facility]. Then proceed to the 2nd floor Surgery Desk To find out your arrival time, please call [phone] between 1PM - 3PM on:01-27-23 Friday If your arrival time is 6:00 am, do not arrive before that time as the [facility] entrance doors do not open until 6:00 am.  REMEMBER: Instructions that are not followed completely may result in serious medical risk, up to and including death; or upon the discretion of your surgeon and anesthesiologist your surgery may need to be rescheduled.  Do not eat food OR drink any liquids after midnight the night before surgery.  No gum chewing or hard candies.  One week prior to surgery: Stop Anti-inflammatories (NSAIDS) such as Advil, Aleve, Ibuprofen, Motrin, Naproxen, Naprosyn and Aspirin based products such as Excedrin, Goody's Powder, BC Powder.You may however, continue to take Tylenol if needed for pain up until the day of surgery.  Stop ANY OVER THE COUNTER supplements/vitamins NOW (01-25-23) until after surgery (Multivitamin, Arthritis/Joint supplement, and Vitamin B12)  Continue your 81 mg Aspirin up until the day prior to surgery-Do NOT take the morning of surgery  TAKE ONLY THESE MEDICATIONS THE MORNING OF SURGERY WITH A SIP OF WATER: -carvedilol (COREG)  -finasteride (PROSCAR)  -simvastatin (ZOCOR)  -Omeprazole-take one the night before and one on the morning of surgery - helps to prevent nausea after surgery.)  No Alcohol for 24 hours before or after surgery.  No Smoking including e-cigarettes for 24 hours before surgery.  No chewable tobacco products for at least 6 hours before surgery.  No nicotine patches on the day of surgery.  Do not use any "recreational" drugs for at least a week (preferably 2 weeks) before your surgery.  Please be advised that the combination of cocaine and anesthesia may have negative outcomes, up to and  including death. If you test positive for cocaine, your surgery will be cancelled.  On the morning of surgery brush your teeth with toothpaste and water, you may rinse your mouth with mouthwash if you wish. Do not swallow any toothpaste or mouthwash.  Do not wear jewelry, make-up, hairpins, clips or nail polish.  Do not wear lotions, powders, or perfumes.   Do not shave body hair from the neck down 48 hours before surgery.  Contact lenses, hearing aids and dentures may not be worn into surgery.  Do not bring valuables to the hospital. [facility] is not responsible for any missing/lost belongings or valuables.   Notify your doctor if there is any change in your medical condition (cold, fever, infection).  Wear comfortable clothing (specific to your surgery type) to the hospital.  After surgery, you can help prevent lung complications by doing breathing exercises.  Take deep breaths and cough every 1-2 hours. Your doctor may order a device called an Incentive Spirometer to help you take deep breaths. When coughing or sneezing, hold a pillow firmly against your incision with both hands. This is called "splinting." Doing this helps protect your incision. It also decreases belly discomfort.  If you are being admitted to the hospital overnight, leave your suitcase in the car. After surgery it may be brought to your room.  In case of increased patient census, it may be necessary for you, the patient, to continue your postoperative care in the Same Day Surgery department.  If you are being discharged the day of surgery, you will not be allowed to drive home.  You will need a responsible individual to drive you home and stay with you for 24 hours after surgery.   If you are taking public transportation, you will need to have a responsible individual with you.  Please call the [place] Dept. at [phone] if you have any questions about these instructions.  Surgery  Visitation Policy:  Patients undergoing a surgery or procedure may have two family members or support persons with them as long as the person is not COVID-19 positive or experiencing its symptoms.   Due to an increase in RSV and influenza rates and associated hospitalizations, children ages 63 and under will not be able to visit patients in [facility]. Masks continue to be strongly recommended.

## 2023-01-26 NOTE — Pre-Procedure Instructions (Addendum)
Called Trevor [name] to see if he had gone to [facility] urgent care like he said he would yesterday while doing anesthesia phone interview due to feeling bad (cough, Chest soreness and cough-denies fever.) Had to leave him a message to call us back in PAT to let us know bc if not he will need to come to [name] to get Covid tested per our NP Honor Loh along with getting his pre surgery labs

## 2023-01-29 MED ORDER — BUPIVACAINE HCL (PF) 0.5 % IJ SOLN
INTRAMUSCULAR | Status: AC
  Filled 2023-01-29: qty 30

## 2023-02-07 ENCOUNTER — Inpatient Hospital Stay
Admission: RE | Admit: 2023-02-07 | Discharge: 2023-02-07 | Disposition: A | Discharge status: Home / Self Care | Payer: Medicare Other | Source: Ambulatory Visit

## 2023-02-07 NOTE — Patient Instructions (Addendum)
Your procedure is scheduled on: Report to the Registration Desk on the 1st floor of the [facility]. To find out your arrival time, please call [phone] between 1PM - 3PM on: If your arrival time is 6:00 am, do not arrive before that time as the [facility] entrance doors do not open until 6:00 am.  REMEMBER: Instructions that are not followed completely may result in serious medical risk, up to and including death; or upon the discretion of your surgeon and anesthesiologist your surgery may need to be rescheduled.  Do not eat food after midnight the night before surgery.  No gum chewing or hard candies.  You may however, drink CLEAR liquids up to 2 hours before you are scheduled to arrive for your surgery. Do not drink anything within 2 hours of your scheduled arrival time.  Clear liquids include: - water  - apple juice without pulp - gatorade (not RED colors) - black coffee or tea (Do NOT add milk or creamers to the coffee or tea) Do NOT drink anything that is not on this list.  Type 1 and Type 2 diabetics should only drink water.  In addition, your doctor has ordered for you to drink the provided:  Ensure Pre-Surgery Clear Carbohydrate Drink  Gatorade G2 Drinking this carbohydrate drink up to two hours before surgery helps to reduce insulin resistance and improve patient outcomes. Please complete drinking 2 hours before scheduled arrival time.  One week prior to surgery: Stop Anti-inflammatories (NSAIDS) such as Advil, Aleve, Ibuprofen, Motrin, Naproxen, Naprosyn and Aspirin based products such as Excedrin, Goody's Powder, BC Powder. Stop ANY OVER THE COUNTER supplements until after surgery. You may however, continue to take Tylenol if needed for pain up until the day of surgery.  Follow recommendations from Cardiologist or PCP regarding stopping blood thinners like Aspirin   TAKE ONLY THESE MEDICATIONS THE MORNING OF SURGERY WITH A SIP OF WATER:  carvedilol (COREG)   2. finasteride (PROSCAR)  3.  Omeprazole    (take one the night before and one on the morning of surgery - helps to prevent nausea after surgery.)     No Alcohol for 24 hours before or after surgery.  No Smoking including e-cigarettes for 24 hours before surgery.  No chewable tobacco products for at least 6 hours before surgery.  No nicotine patches on the day of surgery.  Do not use any "recreational" drugs for at least a week (preferably 2 weeks) before your surgery.  Please be advised that the combination of cocaine and anesthesia may have negative outcomes, up to and including death. If you test positive for cocaine, your surgery will be cancelled.  On the morning of surgery brush your teeth with toothpaste and water, you may rinse your mouth with mouthwash if you wish. Do not swallow any toothpaste or mouthwash.  You need to shower on day of surgery .   Do not wear jewelry, make-up, hairpins, clips or nail polish.  Do not wear lotions, powders, or perfumes.   Do not shave body hair from the neck down 48 hours before surgery.  Contact lenses, hearing aids and dentures may not be worn into surgery.  Do not bring valuables to the hospital. [facility] is not responsible for any missing/lost belongings or valuables.    Bring your C-PAP to the hospital in case you may have to spend the night.   Notify your doctor if there is any change in your medical condition (cold, fever, infection).  Wear  comfortable clothing (specific to your surgery type) to the hospital.  After surgery, you can help prevent lung complications by doing breathing exercises.  Take deep breaths and cough every 1-2 hours. Your doctor may order a device called an Incentive Spirometer to help you take deep breaths.  If you are being discharged the day of surgery, you will not be allowed to drive home. You will need a responsible individual to drive you home and stay with you for 24 hours after surgery.     Please call the [place] Dept. at [phone] if you have any questions about these instructions.  Surgery Visitation Policy:  Patients undergoing a surgery or procedure may have two family members or support persons with them as long as the person is not COVID-19 positive or experiencing its symptoms.   Due to an increase in RSV and influenza rates and associated hospitalizations, children ages 49 and under will not be able to visit patients in [facility]. Masks continue to be strongly recommended.

## 2023-02-07 NOTE — Addendum Note (Signed)
Addended by: Gerald Leitz A on: 02/07/2023 10:33 AM   Modules accepted: Orders

## 2023-03-02 ENCOUNTER — Other Ambulatory Visit: Payer: Medicare Other

## 2023-03-02 DIAGNOSIS — N329 Bladder disorder, unspecified: Secondary | ICD-10-CM

## 2023-03-02 LAB — URINALYSIS, COMPLETE
Bilirubin, UA: NEGATIVE
Glucose, UA: NEGATIVE
Ketones, UA: NEGATIVE
Nitrite, UA: NEGATIVE
Specific Gravity, UA: 1.015 (ref 1.005–1.030)
Urobilinogen, Ur: 0.2 mg/dL (ref 0.2–1.0)
pH, UA: 5 (ref 5.0–7.5)

## 2023-03-02 LAB — MICROSCOPIC EXAMINATION: RBC, Urine: >30 /hpf — AB (ref 0–2)

## 2023-03-03 ENCOUNTER — Encounter
Admission: RE | Admit: 2023-03-03 | Discharge: 2023-03-03 | Disposition: A | Discharge status: Home / Self Care | Payer: Medicare Other | Source: Ambulatory Visit | Attending: Urology | Admitting: Urology

## 2023-03-03 NOTE — Patient Instructions (Signed)
Your procedure is scheduled on:03-13-23 Monday Report to the Registration Desk on the 1st floor of the [facility]. Then proceed to the 2nd floor Surgery Desk To find out your arrival time, please call [phone] between 1PM - 3PM on:03-10-23 Friday If your arrival time is 6:00 am, do not arrive before that time as the [facility] entrance doors do not open until 6:00 am.   REMEMBER: Instructions that are not followed completely may result in serious medical risk, up to and including death; or upon the discretion of your surgeon and anesthesiologist your surgery may need to be rescheduled.   Do not eat food OR drink any liquids after midnight the night before surgery.  No gum chewing or hard candies.   One week prior to surgery: Stop Anti-inflammatories (NSAIDS) such as Advil, Aleve, Ibuprofen, Motrin, Naproxen, Naprosyn and Aspirin based products such as Excedrin, Goody's Powder, BC Powder.You may however, continue to take Tylenol if needed for pain up until the day of surgery.   Stop ANY OVER THE COUNTER supplements/vitamins  (03-06-23) until after surgery (Multivitamin, Arthritis/Joint supplement, and Vitamin B12)   Continue your 81 mg Aspirin up until the day prior to surgery-Do NOT take the morning of surgery   TAKE ONLY THESE MEDICATIONS THE MORNING OF SURGERY WITH A SIP OF WATER: -carvedilol (COREG)  -finasteride (PROSCAR)  -simvastatin (ZOCOR)  -tamsulosin (FLOMAX)  -Prilosec-take one the night before and one on the morning of surgery - helps to prevent nausea after surgery.)   No Alcohol for 24 hours before or after surgery.   No Smoking including e-cigarettes for 24 hours before surgery.  No chewable tobacco products for at least 6 hours before surgery.  No nicotine patches on the day of surgery.   Do not use any "recreational" drugs for at least a week (preferably 2 weeks) before your surgery.  Please be advised that the combination of cocaine and anesthesia may have  negative outcomes, up to and including death. If you test positive for cocaine, your surgery will be cancelled.   On the morning of surgery brush your teeth with toothpaste and water, you may rinse your mouth with mouthwash if you wish. Do not swallow any toothpaste or mouthwash.   Do not wear jewelry, make-up, hairpins, clips or nail polish.   Do not wear lotions, powders, or perfumes.    Do not shave body hair from the neck down 48 hours before surgery.   Contact lenses, hearing aids and dentures may not be worn into surgery.   Do not bring valuables to the hospital. [facility] is not responsible for any missing/lost belongings or valuables.    Notify your doctor if there is any change in your medical condition (cold, fever, infection).   Wear comfortable clothing (specific to your surgery type) to the hospital.   After surgery, you can help prevent lung complications by doing breathing exercises.  Take deep breaths and cough every 1-2 hours. Your doctor may order a device called an Incentive Spirometer to help you take deep breaths. When coughing or sneezing, hold a pillow firmly against your incision with both hands. This is called "splinting." Doing this helps protect your incision. It also decreases belly discomfort.   If you are being admitted to the hospital overnight, leave your suitcase in the car. After surgery it may be brought to your room.   In case of increased patient census, it may be necessary for you, the patient, to continue your postoperative care in  the Same Day Surgery department.   If you are being discharged the day of surgery, you will not be allowed to drive home. You will need a responsible individual to drive you home and stay with you for 24 hours after surgery.    If you are taking public transportation, you will need to have a responsible individual with you.   Surgery Visitation Policy:   Patients undergoing a surgery or procedure may have two  family members or support persons with them as long as the person is not COVID-19 positive or experiencing its symptoms  Please call the [place] Dept. at [phone] if you have any questions about these instructions.   Marland Kitchen

## 2023-03-03 NOTE — Pre-Procedure Instructions (Signed)
Pre op phone interview with patient. Chart reviewed. Medical/surgical history, medications, psychosocial, ADL's, and discharge plan reviewed with patient. Verbal and written instructions thru MyChart given. Patient verbalized understanding. Aware to call with any questions/concerns. 

## 2023-03-06 ENCOUNTER — Encounter: Payer: Self-pay | Admitting: Urology

## 2023-03-06 LAB — CULTURE, URINE COMPREHENSIVE

## 2023-03-07 ENCOUNTER — Encounter: Payer: Self-pay | Admitting: Urology

## 2023-03-07 NOTE — Progress Notes (Signed)
Perioperative / Anesthesia Services  Pre-Admission Testing Clinical Review / Preoperative Anesthesia Consult  Date: 03/08/23  Patient Demographics:  Name: Trevor [name] DOB:   1946-01-20 MRN:   [id number]  Planned Surgical Procedure(s):    Case: [id number] Date/Time: 03/13/23 1116   Procedures:      CYSTOSCOPY WITH BLADDER BIOPSY     CYSTOSCOPY WITH RETROGRADE PYELOGRAM (Bilateral)   Anesthesia type: General   Pre-op diagnosis: Bladder Lesion   Location: [place] [facility] / [place] [facility]   Surgeons: Hollice Espy, MD     NOTE: Available PAT nursing documentation and vital signs have been reviewed. Clinical nursing staff has updated patient's PMH/PSHx, current medication list, and drug allergies/intolerances to ensure comprehensive history available to assist in medical decision making as it pertains to the aforementioned surgical procedure and anticipated anesthetic course. Extensive review of available clinical information personally performed.  PMH and PSHx updated with any diagnoses/procedures that  may have been inadvertently omitted during his intake with the pre-admission testing department's nursing staff.  Clinical Discussion:  Trevor [name] is a 77 y.o. male who is submitted for pre-surgical anesthesia review and clearance prior to him undergoing the above procedure. Patient has never been a smoker. Pertinent PMH includes: CAD, aortic stenosis, NSVT, cardiac murmur, HTN, HLD, prediabetes, GERD (on daily PPI), OSAH (does not utilize nocturnal PAP therapy), splenomegaly, cirrhosis, anemia, thrombocytopenia, chronic lower back pain, lumbar spondylosis, remote bladder cancer, cervical DDD, BPH, anxiety, depression, sleep difficulties.  Patient is followed by cardiology Saralyn Pilar, MD). He was last seen in the cardiology clinic on 12/09/2022; notes reviewed. At the time of his clinic visit, patient doing well overall from a cardiovascular perspective. Patient  denied any chest pain, shortness of breath, PND, orthopnea, palpitations, significant peripheral edema, weakness, fatigue, vertiginous symptoms, or presyncope/syncope. Patient with a past medical history significant for cardiovascular diagnoses. Documented physical exam was grossly benign, providing no evidence of acute exacerbation and/or decompensation of the patient's known cardiovascular conditions.  Long-term cardiac event monitor study performed on 10/05/2022 revealed a predominant underlying sinus rhythm with a average heart rate of 65 bpm; range 45-108 bpm.  There were occasional PACs.  There were no significant arrhythmias or pauses noted.  Most recent TTE was performed on 10/12/2022 revealing normal left ventricular systolic function with an EF of >55%.  There was mild concentric LVH.  There were no regional wall motion abnormalities. Left ventricular diastolic Doppler parameters consistent with abnormal relaxation (G1DD).  There was mild biatrial and mild right ventricular enlargement.  Trivial to mild pan valvular regurgitation observed.  Mild aortic valve stenosis noted with a mean pressure gradient of 8.2 mmHg; AVA (VTI) = 1.3 cm.  Blood pressure well controlled at 124/74 mmHg on currently prescribed beta-blocker (carvedilol), ACEi (lisinopril) and diuretic (HCTZ) therapies. Patient is on simvastatin for his HLD diagnosis and ASCVD prevention.  Patient has a prediabetes diagnosis; last HgbA1c at the time of his cardiology visit was 6.0%.  Of note, level has been rechecked since patient was last seen with further improvement to 5.6% on 02/21/2023.  Patient does have an OSAH diagnosis, however he does not utilize nocturnal PAP therapy.  Patient makes efforts to remain active.  He continues to work full-time.  Patient does not have a standard exercise routine. Functional capacity, as defined by DAS[facility], is documented as being >/= 4 METS.  No changes were made to his medication regimen.  Patient to  follow-up with outpatient cardiology in 4 months or  sooner if needed.  Trevor [name] is scheduled for an CYSTOSCOPY WITH BLADDER BIOPSY; CYSTOSCOPY WITH RETROGRADE PYELOGRAM (Bilateral) on 03/13/2023 with Dr. Hollice Espy, MD.  Given patient's past medical history significant for cardiovascular diagnoses, presurgical cardiac clearance was sought by the PAT team. Per cardiology, "this patient is optimized for surgery and may proceed with the planned procedural course with a LOW risk of significant perioperative cardiovascular complications".    In review of his medication reconciliation, it is noted that patient is currently on prescribed daily antiplatelet therapy.  Per direction from the patient's primary attending surgeon, patient may continue his daily low-dose ASA throughout his perioperative course.  Patient denies previous perioperative complications with anesthesia in the past. In review of the available records, it is noted that patient underwent a general anesthetic course here at [facility] ([facility]) in 11/2021 without documented complications.      03/03/2023    2:46 PM 01/25/2023    3:00 PM 01/17/2023    3:13 PM  Vitals with BMI  Height 5\' 7"  5\' 7"    Weight 170 lbs 175 lbs 15 oz   BMI [id number]   Systolic   0000000  Diastolic   66  Pulse   94    Providers/Specialists:   NOTE: Primary physician provider listed below. Patient may have been seen by APP or partner within same practice.   PROVIDER ROLE / SPECIALTY LAST Lu Duffel, MD Urology (Surgeon) 01/17/2023  Derinda Late, MD Primary Care Provider 02/28/2023  Isaias Cowman, MD Cardiology 12/09/2022   Allergies:  Ceftriaxone, Doxycycline, and Macrobid [nitrofurantoin]  Current Home Medications:    bcg vaccine injection 81 mg    acetaminophen (TYLENOL) 650 MG CR tablet   aspirin 81 MG EC tablet   carvedilol (COREG) 3.125 MG tablet   Ferrous Sulfate (IRON PO)    finasteride (PROSCAR) 5 MG tablet   fluticasone (FLONASE) 50 MCG/ACT nasal spray   lisinopril-hydrochlorothiazide (ZESTORETIC) 20-25 MG tablet   Multiple Vitamin (MULTIVITAMIN WITH MINERALS) TABS tablet   naproxen sodium (ALEVE) 220 MG tablet   Omeprazole 20 MG TBEC   OVER THE COUNTER MEDICATION   senna-docusate (SENOKOT-S) 8.6-50 MG tablet   simvastatin (ZOCOR) 40 MG tablet   tamsulosin (FLOMAX) 0.4 MG CAPS capsule   traZODone (DESYREL) 100 MG tablet   vitamin B-12 (CYANOCOBALAMIN) 500 MCG tablet   History:   Past Medical History:  Diagnosis Date   Acne    AKI (acute kidney injury) ([place])    Allergic rhinitis    Anemia    Anxiety    Aortic stenosis    a.) TTE 10/12/2022: mild AS (MPG 8.2); AVA (VTI) = 1.3 cm2   Bladder cancer ([place])    a.) remission since Oct 2014   BPH (benign prostatic hypertrophy) with urinary obstruction    CAD (coronary artery disease)    Carpal tunnel syndrome    Cervicalgia    Chronic low back pain    Cirrhosis (HCC)    Degenerative disc disease, cervical    Depression    Gastritis    GERD (gastroesophageal reflux disease)    Heart murmur    Hemorrhoids    History of malignant neoplasm of bladder    Hyperlipidemia    Hypertension    Hypokalemia    Hypospadias    Lumbar spondylosis    a.) s/p L2-4 PLIF with L4-5 and L5-S1 interbody fusion   Non-sustained ventricular tachycardia ([place]) 09/2022   a.) 8  beat episode of NSVT during admission in setting urosepsis   Pre-diabetes    Sepsis ([place])    Sleep apnea    a.) does not utilize nocturnal PAP therapy   Sleep difficulties    a.) on trazodone PRN   Splenomegaly    Thrombocytopenia (HCC)    Urinary frequency    Past Surgical History:  Procedure Laterality Date   ADENOIDECTOMY     ANTERIOR FUSION LUMBAR SPINE  11/20/2017   multiple levels; [name]   ANTERIOR LAT LUMBAR FUSION N/A 09/11/2019   Procedure: PRONE XLIF L2-3;  Surgeon: Meade Maw, MD;  Location: [facility]S;  Service:  Neurosurgery;  Laterality: N/A;   BLADDER SURGERY     2011, 2013, 2015   COLONOSCOPY WITH PROPOFOL N/A 07/31/2015   Procedure: COLONOSCOPY WITH PROPOFOL;  Surgeon: Manya Silvas, MD;  Location: [facility] ENDOSCOPY;  Service: Endoscopy;  Laterality: N/A;   COLONOSCOPY, ESOPHAGOGASTRODUODENOSCOPY (EGD) AND ESOPHAGEAL DILATION  2006   CYSTOSCOPY W/ RETROGRADES Bilateral 11/11/2021   Procedure: CYSTOSCOPY WITH RETROGRADE PYELOGRAM;  Surgeon: Hollice Espy, MD;  Location: [facility] ORS;  Service: Urology;  Laterality: Bilateral;   CYSTOSCOPY WITH BIOPSY N/A 11/11/2021   Procedure: CYSTOSCOPY WITH BLADDER BIOPSY;  Surgeon: Hollice Espy, MD;  Location: [facility]S;  Service: Urology;  Laterality: N/A;   [place]   LUMBAR LAMINECTOMY/DECOMPRESSION MICRODISCECTOMY N/A 06/20/2018   Procedure: LUMBAR LAMINECTOMY/DECOMPRESSION MICRODISCECTOMY 1 LEVEL-L-2-3;  Surgeon: Meade Maw, MD;  Location: [facility] ORS;  Service: Neurosurgery;  Laterality: N/A;   POSTERIOR FUSION LUMBAR SPINE  10/02/2019   L2-4   POSTERIOR LUMBAR FUSION 4 WITH HARDWARE REMOVAL N/A 09/11/2019   Procedure: L2-S1 POSTERIOR FUSION, ABORTED;  Surgeon: Meade Maw, MD;  Location: [facility] ORS;  Service: Neurosurgery;  Laterality: N/A;   SEPTOPLASTY  1996   TONSILLECTOMY     Family History  Problem Relation Age of Onset   Hyperlipidemia Brother    Hyperlipidemia Sister    Heart attack Father    Social History   Tobacco Use   Smoking status: Never    Passive exposure: Never   Smokeless tobacco: Never  Vaping Use   Vaping Use: Never used  Substance Use Topics   Alcohol use: No    Alcohol/week: 0.0 standard drinks of alcohol   Drug use: No    Pertinent Clinical Results:  LABS:   No visits with results within 3 Day(s) from this visit.  Latest known visit with results is:  Appointment on 03/02/2023  Component Date Value Ref Range Status   Urine Culture, Comprehensive 03/02/2023 Final report   Final    Organism ID, Bacteria 03/02/2023 Comment   Final   No growth in 36 - 48 hours.   Specific Gravity, UA 03/02/2023 1.015  1.005 - 1.030 Final   pH, UA 03/02/2023 5.0  5.0 - 7.5 Final   Color, UA 03/02/2023 Red (A)  Yellow Final   Appearance Ur 03/02/2023 Cloudy (A)  Clear Final   Leukocytes,UA 03/02/2023 Trace (A)  Negative Final   Protein,UA 03/02/2023 3+ (A)  Negative/Trace Final   Glucose, UA 03/02/2023 Negative  Negative Final   Ketones, UA 03/02/2023 Negative  Negative Final   RBC, UA 03/02/2023 3+ (A)  Negative Final   Bilirubin, UA 03/02/2023 Negative  Negative Final   Urobilinogen, Ur 03/02/2023 0.2  0.2 - 1.0 mg/dL Final   Nitrite, UA 03/02/2023 Negative  Negative Final   Microscopic Examination 03/02/2023 See below:   Final   WBC, UA 03/02/2023 6-10 (A)  0 - 5 /hpf Final   RBC, Urine 03/02/2023 >30 (A)  0 - 2 /hpf Final   Epithelial Cells (non renal) 03/02/2023 0-10  0 - 10 /hpf Final   Bacteria, UA 03/02/2023 Moderate (A)  None seen/Few Final    ECG: Date: 09/24/2022 Time ECG obtained: 1215 PM Rate: 64 bpm Rhythm: normal sinus Axis (leads I and aVF): Right axis deviation Intervals: PR 154 ms. QRS 78 ms. QTc 416 ms. ST segment and T wave changes: Nonspecific T wave abnormalities in the inferior leads Comparison: Similar to previous tracing obtained on 09/22/2022    IMAGING / PROCEDURES: TRANSTHORACIC ECHOCARDIOGRAM performed on 10/12/2022 Normal left ventricular systolic function with an EF of >55%  Mild concentric LVH No regional wall motion abnormalities Left ventricular diastolic Doppler parameters consistent with abnormal relaxation (G1DD). Mild right ventricular enlargement mild biatrial enlargement Trivial AR and PR Mild MR and TR Trivial aortic stenosis with a mean pressure gradient of 8.2 mmHg; AVA (VTI) = 1.3 cm No pericardial effusion  CT ABDOMEN PELVIS WO CONTRAST performed on 09/22/2022 There is no evidence of intestinal obstruction or  pneumoperitoneum. There is no hydronephrosis. New small bilateral pleural effusions. Increased markings in the periphery of lower lung fields may suggest scarring from chronic interstitial lung disease.  Extensive coronary artery calcifications are seen. Cirrhosis of liver.   Spleen is enlarged.   Lumbar spondylosis.   Impression and Plan:  Trevor [name] has been referred for pre-anesthesia review and clearance prior to him undergoing the planned anesthetic and procedural courses. Available labs, pertinent testing, and imaging results were personally reviewed by me in preparation for upcoming operative/procedural course. [facility] medical record has been updated following extensive record review and patient interview with PAT staff.   This patient has been appropriately cleared by cardiology with an overall LOW risk of significant perioperative cardiovascular complications. Based on clinical review performed today (03/08/23), barring any significant acute changes in the patient's overall condition, it is anticipated that he will be able to proceed with the planned surgical intervention. Any acute changes in clinical condition may necessitate his procedure being postponed and/or cancelled. Patient will meet with anesthesia team (MD and/or CRNA) on the day of his procedure for preoperative evaluation/assessment. Questions regarding anesthetic course will be fielded at that time.   Pre-surgical instructions were reviewed with the patient during his PAT appointment, and questions were fielded to satisfaction by PAT clinical staff. He has been instructed on which medications that he will need to hold prior to surgery, as well as the ones that have been deemed safe/appropriate to take of the day of his procedure. As part of the general education provided by PAT, patient made aware both verbally and in writing, that he would need to abstain from the use of any illegal substances during his perioperative  course.  He was advised that failure to follow the provided instructions could necessitate case cancellation or result serious perioperative complications up to and including death. Patient encouraged to contact PAT and/or his surgeon's office to discuss any questions or concerns that may arise prior to surgery; verbalized understanding.   Honor Loh, MSN, APRN, FNP-C, CEN [facility]  Peri-operative Services Nurse Practitioner Phone: [phone] Fax: [phone] 03/08/23 8:49 AM  NOTE: This note has been prepared using Dragon dictation software. Despite my best ability to proofread, there is always the potential that unintentional transcriptional errors may still occur from this process.

## 2023-03-12 MED ORDER — ORAL CARE MOUTH RINSE: 15.0000 mL | Freq: Once | OROMUCOSAL | Status: AC

## 2023-03-12 MED ORDER — CEFAZOLIN SODIUM-DEXTROSE 2-4 GM/100ML-% IV SOLN
2.0000 g | INTRAVENOUS | Status: AC
  Administered 2023-03-13: 2 g via INTRAVENOUS

## 2023-03-12 MED ORDER — LACTATED RINGERS IV SOLN: INTRAVENOUS | Status: DC

## 2023-03-12 MED ORDER — CHLORHEXIDINE GLUCONATE 0.12 % MT SOLN: 15.0000 mL | Freq: Once | OROMUCOSAL | Status: AC

## 2023-03-13 ENCOUNTER — Ambulatory Visit: Payer: Medicare Other | Admitting: Urgent Care

## 2023-03-13 ENCOUNTER — Encounter
Admission: RE | Disposition: A | Discharge status: Home / Self Care | Payer: Self-pay | Source: Ambulatory Visit | Attending: Urology

## 2023-03-13 ENCOUNTER — Ambulatory Visit
Admission: RE | Admit: 2023-03-13 | Discharge: 2023-03-13 | Disposition: A | Discharge status: Home / Self Care | Payer: Medicare Other | Source: Ambulatory Visit | Attending: Urology | Admitting: Urology

## 2023-03-13 ENCOUNTER — Other Ambulatory Visit: Payer: Self-pay

## 2023-03-13 ENCOUNTER — Ambulatory Visit: Payer: Medicare Other

## 2023-03-13 ENCOUNTER — Encounter: Payer: Self-pay | Admitting: Urology

## 2023-03-13 DIAGNOSIS — N139 Obstructive and reflux uropathy, unspecified: Secondary | ICD-10-CM | POA: Diagnosis not present

## 2023-03-13 DIAGNOSIS — C672 Malignant neoplasm of lateral wall of bladder: Secondary | ICD-10-CM | POA: Diagnosis not present

## 2023-03-13 DIAGNOSIS — C679 Malignant neoplasm of bladder, unspecified: Secondary | ICD-10-CM

## 2023-03-13 DIAGNOSIS — I10 Essential (primary) hypertension: Secondary | ICD-10-CM | POA: Diagnosis not present

## 2023-03-13 DIAGNOSIS — G4730 Sleep apnea, unspecified: Secondary | ICD-10-CM | POA: Insufficient documentation

## 2023-03-13 DIAGNOSIS — N35912 Unspecified bulbous urethral stricture, male: Secondary | ICD-10-CM | POA: Insufficient documentation

## 2023-03-13 DIAGNOSIS — N3289 Other specified disorders of bladder: Secondary | ICD-10-CM | POA: Diagnosis not present

## 2023-03-13 DIAGNOSIS — I2510 Atherosclerotic heart disease of native coronary artery without angina pectoris: Secondary | ICD-10-CM | POA: Insufficient documentation

## 2023-03-13 DIAGNOSIS — F32A Depression, unspecified: Secondary | ICD-10-CM | POA: Insufficient documentation

## 2023-03-13 DIAGNOSIS — Z8551 Personal history of malignant neoplasm of bladder: Secondary | ICD-10-CM | POA: Diagnosis not present

## 2023-03-13 DIAGNOSIS — K219 Gastro-esophageal reflux disease without esophagitis: Secondary | ICD-10-CM | POA: Diagnosis not present

## 2023-03-13 DIAGNOSIS — N401 Enlarged prostate with lower urinary tract symptoms: Secondary | ICD-10-CM | POA: Diagnosis not present

## 2023-03-13 DIAGNOSIS — E785 Hyperlipidemia, unspecified: Secondary | ICD-10-CM | POA: Diagnosis not present

## 2023-03-13 DIAGNOSIS — R310 Gross hematuria: Secondary | ICD-10-CM | POA: Diagnosis present

## 2023-03-13 DIAGNOSIS — N329 Bladder disorder, unspecified: Secondary | ICD-10-CM

## 2023-03-13 DIAGNOSIS — Z981 Arthrodesis status: Secondary | ICD-10-CM | POA: Diagnosis not present

## 2023-03-13 DIAGNOSIS — F419 Anxiety disorder, unspecified: Secondary | ICD-10-CM | POA: Diagnosis not present

## 2023-03-13 HISTORY — PX: URETEROSCOPY: SHX842

## 2023-03-13 HISTORY — DX: Malignant neoplasm of bladder, unspecified: C67.9

## 2023-03-13 HISTORY — DX: Other chronic pain: G89.29

## 2023-03-13 HISTORY — DX: Spondylosis without myelopathy or radiculopathy, lumbar region: M47.816

## 2023-03-13 HISTORY — DX: Atherosclerotic heart disease of native coronary artery without angina pectoris: I25.10

## 2023-03-13 HISTORY — DX: Sleep disorder, unspecified: G47.9

## 2023-03-13 HISTORY — DX: Low back pain, unspecified: M54.50

## 2023-03-13 HISTORY — DX: Splenomegaly, not elsewhere classified: R16.1

## 2023-03-13 HISTORY — DX: Unspecified cirrhosis of liver: K74.60

## 2023-03-13 HISTORY — PX: BIOPSY: SHX5522

## 2023-03-13 HISTORY — PX: CYSTOSCOPY W/ RETROGRADES: SHX1426

## 2023-03-13 HISTORY — PX: CYSTOSCOPY WITH BIOPSY: SHX5122

## 2023-03-13 HISTORY — DX: Nonrheumatic aortic (valve) stenosis: I35.0

## 2023-03-13 SURGERY — CYSTOSCOPY, WITH BIOPSY
Anesthesia: General | Site: Ureter

## 2023-03-13 MED ORDER — OXYBUTYNIN CHLORIDE 5 MG PO TABS: 5.0000 mg | ORAL_TABLET | Freq: Three times a day (TID) | ORAL | 0 refills | Status: DC | PRN

## 2023-03-13 MED ORDER — CEFAZOLIN SODIUM-DEXTROSE 2-4 GM/100ML-% IV SOLN
INTRAVENOUS | Status: AC
  Filled 2023-03-13: qty 100

## 2023-03-13 MED ORDER — LACTATED RINGERS IV SOLN: INTRAVENOUS | Status: DC | PRN

## 2023-03-13 MED ORDER — STERILE WATER FOR IRRIGATION IR SOLN
Status: DC | PRN
  Administered 2023-03-13: 3000 mL

## 2023-03-13 MED ORDER — PROPOFOL 10 MG/ML IV BOLUS
INTRAVENOUS | Status: DC | PRN
  Administered 2023-03-13: 170 mg via INTRAVENOUS
  Administered 2023-03-13: 10 mg via INTRAVENOUS

## 2023-03-13 MED ORDER — IOHEXOL 180 MG/ML  SOLN
INTRAMUSCULAR | Status: DC | PRN
  Administered 2023-03-13: 20 mL
  Administered 2023-03-13: 10 mL

## 2023-03-13 MED ORDER — FENTANYL CITRATE (PF) 100 MCG/2ML IJ SOLN: 25.0000 ug | INTRAMUSCULAR | Status: DC | PRN

## 2023-03-13 MED ORDER — CHLORHEXIDINE GLUCONATE 0.12 % MT SOLN
OROMUCOSAL | Status: AC
  Administered 2023-03-13: 15 mL via OROMUCOSAL
  Filled 2023-03-13: qty 15

## 2023-03-13 MED ORDER — HYDROCODONE-ACETAMINOPHEN 5-325 MG PO TABS: 1.0000 | ORAL_TABLET | Freq: Four times a day (QID) | ORAL | 0 refills | Status: DC | PRN

## 2023-03-13 MED ORDER — ACETAMINOPHEN 10 MG/ML IV SOLN
INTRAVENOUS | Status: AC
  Filled 2023-03-13: qty 100

## 2023-03-13 MED ORDER — HYDROMORPHONE HCL 1 MG/ML IJ SOLN
INTRAMUSCULAR | Status: DC | PRN
  Administered 2023-03-13: .2 mg via INTRAVENOUS
  Administered 2023-03-13: .3 mg via INTRAVENOUS

## 2023-03-13 MED ORDER — EPHEDRINE SULFATE (PRESSORS) 50 MG/ML IJ SOLN
INTRAMUSCULAR | Status: DC | PRN
  Administered 2023-03-13: 10 mg via INTRAVENOUS
  Administered 2023-03-13 (×3): 5 mg via INTRAVENOUS

## 2023-03-13 MED ORDER — ONDANSETRON HCL 4 MG/2ML IJ SOLN: 4.0000 mg | Freq: Once | INTRAMUSCULAR | Status: DC | PRN

## 2023-03-13 MED ORDER — PHENYLEPHRINE 80 MCG/ML (10ML) SYRINGE FOR IV PUSH (FOR BLOOD PRESSURE SUPPORT)
PREFILLED_SYRINGE | INTRAVENOUS | Status: DC | PRN
  Administered 2023-03-13: 80 ug via INTRAVENOUS
  Administered 2023-03-13 (×4): 160 ug via INTRAVENOUS

## 2023-03-13 MED ORDER — LIDOCAINE HCL (CARDIAC) PF 100 MG/5ML IV SOSY
PREFILLED_SYRINGE | INTRAVENOUS | Status: DC | PRN
  Administered 2023-03-13: 80 mg via INTRAVENOUS

## 2023-03-13 MED ORDER — FENTANYL CITRATE (PF) 100 MCG/2ML IJ SOLN
INTRAMUSCULAR | Status: AC
  Filled 2023-03-13: qty 2

## 2023-03-13 MED ORDER — FENTANYL CITRATE (PF) 100 MCG/2ML IJ SOLN
INTRAMUSCULAR | Status: DC | PRN
  Administered 2023-03-13: 25 ug via INTRAVENOUS
  Administered 2023-03-13: 50 ug via INTRAVENOUS
  Administered 2023-03-13: 25 ug via INTRAVENOUS

## 2023-03-13 MED ORDER — ONDANSETRON HCL 4 MG/2ML IJ SOLN
INTRAMUSCULAR | Status: DC | PRN
  Administered 2023-03-13: 4 mg via INTRAVENOUS

## 2023-03-13 MED ORDER — HYDROMORPHONE HCL 1 MG/ML IJ SOLN
INTRAMUSCULAR | Status: AC
  Filled 2023-03-13: qty 1

## 2023-03-13 MED ORDER — DEXAMETHASONE SODIUM PHOSPHATE 10 MG/ML IJ SOLN
INTRAMUSCULAR | Status: DC | PRN
  Administered 2023-03-13: 5 mg via INTRAVENOUS

## 2023-03-13 MED ORDER — ACETAMINOPHEN 10 MG/ML IV SOLN
INTRAVENOUS | Status: DC | PRN
  Administered 2023-03-13: 1000 mg via INTRAVENOUS

## 2023-03-13 SURGICAL SUPPLY — 30 items
BAG DRAIN SIEMENS DORNER NS (MISCELLANEOUS) ×4 IMPLANT
BAG DRN NS LF (MISCELLANEOUS) ×4
BRUSH SCRUB EZ  4% CHG (MISCELLANEOUS)
BRUSH SCRUB EZ 1% IODOPHOR (MISCELLANEOUS) ×4 IMPLANT
BRUSH SCRUB EZ 4% CHG (MISCELLANEOUS) ×4 IMPLANT
CATH URET FLEX-TIP 2 LUMEN 10F (CATHETERS) IMPLANT
CATH URETL OPEN 5X70 (CATHETERS) ×4 IMPLANT
DRAPE UTILITY 15X26 TOWEL STRL (DRAPES) ×4 IMPLANT
DRSG TELFA 3X4 N-ADH STERILE (GAUZE/BANDAGES/DRESSINGS) ×4 IMPLANT
ELECT REM PT RETURN 9FT ADLT (ELECTROSURGICAL) ×4
ELECTRODE REM PT RTRN 9FT ADLT (ELECTROSURGICAL) ×4 IMPLANT
FORCEPS BIOP PIRANHA Y (CUTTING FORCEPS) IMPLANT
GAUZE 4X4 16PLY ~~LOC~~+RFID DBL (SPONGE) ×8 IMPLANT
GLOVE BIO SURGEON STRL SZ 6.5 (GLOVE) ×4 IMPLANT
GOWN STRL REUS W/ TWL LRG LVL3 (GOWN DISPOSABLE) ×8 IMPLANT
GOWN STRL REUS W/TWL LRG LVL3 (GOWN DISPOSABLE) ×8
GUIDEWIRE GREEN .038 145CM (MISCELLANEOUS) IMPLANT
GUIDEWIRE STR DUAL SENSOR (WIRE) ×4 IMPLANT
IV NS IRRIG 3000ML ARTHROMATIC (IV SOLUTION) ×4 IMPLANT
KIT TURNOVER CYSTO (KITS) ×4 IMPLANT
NDL SAFETY ECLIP 18X1.5 (MISCELLANEOUS) ×4 IMPLANT
PACK CYSTO AR (MISCELLANEOUS) ×4 IMPLANT
SET CYSTO W/LG BORE CLAMP LF (SET/KITS/TRAYS/PACK) ×4 IMPLANT
SHEATH NAVIGATOR HD 12/14X46 (SHEATH) IMPLANT
STENT URET 6FRX24 CONTOUR (STENTS) IMPLANT
SURGILUBE 2OZ TUBE FLIPTOP (MISCELLANEOUS) ×4 IMPLANT
TRAP FLUID SMOKE EVACUATOR (MISCELLANEOUS) ×4 IMPLANT
WATER STERILE IRR 1000ML POUR (IV SOLUTION) ×4 IMPLANT
WATER STERILE IRR 3000ML UROMA (IV SOLUTION) ×4 IMPLANT
WATER STERILE IRR 500ML POUR (IV SOLUTION) ×4 IMPLANT

## 2023-03-13 NOTE — Anesthesia Procedure Notes (Signed)
Procedure Name: LMA Insertion Date/Time: 03/13/2023 1:18 PM  Performed by: Otho Perl, CRNAPre-anesthesia Checklist: Patient identified, Patient being monitored, Timeout performed, Emergency Drugs available and Suction available Patient Re-evaluated:Patient Re-evaluated prior to induction Oxygen Delivery Method: Circle system utilized Preoxygenation: Pre-oxygenation with 100% oxygen Induction Type: IV induction Ventilation: Mask ventilation without difficulty LMA: LMA inserted LMA Size: 4.0 Tube type: Oral Number of attempts: 1 Placement Confirmation: positive ETCO2 and breath sounds checked- equal and bilateral Tube secured with: Tape Dental Injury: Teeth and Oropharynx as per pre-operative assessment

## 2023-03-13 NOTE — Transfer of Care (Signed)
Immediate Anesthesia Transfer of Care Note  Patient: Trevor [name]  Procedure(s) Performed: CYSTOSCOPY WITH BLADDER BIOPSY (Bladder) CYSTOSCOPY WITH RETROGRADE PYELOGRAM (Bilateral: Bladder) URETEROSCOPY (Left: Ureter) BIOPSY (Left: Renal) URETERAL CATHETER OR STENT PLACEMENT (Left: Ureter)  Patient Location: PACU  Anesthesia Type:General  Level of Consciousness: drowsy  Airway & Oxygen Therapy: Patient Spontanous Breathing and Patient connected to face mask oxygen  Post-op Assessment: Report given to RN and Post -op Vital signs reviewed and stable  Post vital signs: Reviewed  Last Vitals:  Vitals Value Taken Time  BP 152/59 03/13/23 1436  Temp 94F   Pulse 63 03/13/23 1439  Resp 12 03/13/23 1439  SpO2 100 % 03/13/23 1439  Vitals shown include unvalidated device data.  Last Pain:  Vitals:   03/13/23 0917  TempSrc: Tympanic  PainSc: 0-No pain         Complications: No notable events documented.

## 2023-03-13 NOTE — Anesthesia Preprocedure Evaluation (Signed)
Anesthesia Evaluation  Patient identified by MRN, date of birth, ID band Patient awake    Reviewed: Allergy & Precautions, H&P , NPO status , Patient's Chart, lab work & pertinent test results  History of Anesthesia Complications Negative for: history of anesthetic complications  Airway Mallampati: III  TM Distance: <3 FB Neck ROM: limited    Dental  (+) Dental Advidsory Given   Pulmonary neg shortness of breath, sleep apnea , neg recent URI   Pulmonary exam normal        Cardiovascular Exercise Tolerance: Good hypertension, Pt. on medications (-) angina + CAD  (-) Past MI and (-) DOE + dysrhythmias Atrial Fibrillation + Valvular Problems/Murmurs MR  Rhythm:Regular Rate:Normal     Neuro/Psych neg Seizures PSYCHIATRIC DISORDERS Anxiety Depression     Neuromuscular disease (chronic back pain s/p fusions)    GI/Hepatic Neg liver ROS,GERD  Medicated and Controlled,,  Endo/Other  negative endocrine ROS    Renal/GU Renal disease   Bladder lesions    Musculoskeletal  (+) Arthritis ,    Abdominal Normal abdominal exam  (+)   Peds  Hematology negative hematology ROS (+)   Anesthesia Other Findings Past Medical History: No date: Acne No date: Allergic rhinitis No date: Anemia No date: Anxiety No date: BPH (benign prostatic hypertrophy) with urinary obstruction No date: Cancer [facility])     Comment:  bladder CA; remission since Oct 2014 No date: Carpal tunnel syndrome No date: Cervicalgia No date: Degenerative disc disease, cervical     Comment:  neck and back;  No date: Depression     Comment:  controlled;  No date: Gastritis No date: GERD (gastroesophageal reflux disease) No date: Hemorrhoids No date: History of malignant neoplasm of bladder No date: Hyperlipidemia No date: Hypertension     Comment:  controlled with medication;  No date: Hypospadias No date: Low back pain No date: Mitral regurgitation No  date: Pre-diabetes No date: Sleep apnea No date: Urinary frequency  Past Surgical History: 09/11/2019: ANTERIOR LAT LUMBAR FUSION; N/A     Comment:  Procedure: PRONE XLIF L2-3;  Surgeon: Meade Maw, MD;  Location: [facility] ORS;  Service: Neurosurgery;              Laterality: N/A; No date: BLADDER SURGERY     Comment:  2011, 2013, 2015 07/31/2015: COLONOSCOPY WITH PROPOFOL; N/A     Comment:  Procedure: COLONOSCOPY WITH PROPOFOL;  Surgeon: Manya Silvas, MD;  Location: [facility] ENDOSCOPY;  Service:               Endoscopy;  Laterality: N/A; No date: COLONOSCOPY, ESOPHAGOGASTRODUODENOSCOPY (EGD) AND ESOPHAGEAL  DILATION No date: FLEXIBLE SIGMOIDOSCOPY 06/20/2018: LUMBAR LAMINECTOMY/DECOMPRESSION MICRODISCECTOMY; N/A     Comment:  Procedure: LUMBAR LAMINECTOMY/DECOMPRESSION               MICRODISCECTOMY 1 LEVEL-L-2-3;  Surgeon: Meade Maw, MD;  Location: [facility] ORS;  Service: Neurosurgery;              Laterality: N/A; 09/11/2019: POSTERIOR LUMBAR FUSION 4 WITH HARDWARE REMOVAL; N/A     Comment:  Procedure: L2-S1 POSTERIOR FUSION, ABORTED;  Surgeon:               Meade Maw, MD;  Location: [facility] ORS;  Service:  Neurosurgery;  Laterality: N/A; No date: SEPTOPLASTY No date: TONSILLECTOMY  BMI    Body Mass Index: 28.54 kg/m      Reproductive/Obstetrics negative OB ROS                             Anesthesia Physical Anesthesia Plan  ASA: 3  Anesthesia Plan: General   Post-op Pain Management:    Induction: Intravenous  PONV Risk Score and Plan: 2 and Ondansetron, Dexamethasone and Treatment may vary due to age or medical condition  Airway Management Planned: LMA  Additional Equipment:   Intra-op Plan:   Post-operative Plan: Extubation in OR  Informed Consent: I have reviewed the patients History and Physical, chart, labs and discussed the procedure including the risks, benefits  and alternatives for the proposed anesthesia with the patient or authorized representative who has indicated his/her understanding and acceptance.     Dental Advisory Given  Plan Discussed with: Anesthesiologist, CRNA and Surgeon  Anesthesia Plan Comments: (Patient consented for risks of anesthesia including but not limited to:  - adverse reactions to medications - damage to teeth, lips or other oral mucosa - sore throat or hoarseness - Damage to heart, brain, lungs or loss of life  Patient voiced understanding.)        Anesthesia Quick Evaluation

## 2023-03-13 NOTE — H&P (Signed)
03/13/23  RRR CTAB   No changes since last visit other than new thyroid med  CC:     Chief Complaint  Patient presents with   Cysto      HPI: Trevor [name] is a 77 y.o. malewith a personal history of elevated PSA, BPH with LUTS, and bladder cancer, who presents today for a surveillance cystoscopy.    He was first  diagnosed in 2011, low-grade TA disease identified a gross hematuria work-up.  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   He is s/p cystoscopy, bladder biopsy, and bilateral retrograde pyelogram on 11/11/2021. 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     He completed induction BCG on 02/24/2022 followed by BCG x 1 for maintenance but was aborted due to hematuria.     Urinalysis today with RBCs and WBCs, minimal bacteria.  Nitrite negative.   He returns today for reevaluation with cystoscopy.  He continues to have intermittent hematuria.  His urine cytology showed inflammatory type picture.  Urine cultures been negative.      Vitals:    01/17/23 1513  BP: 128/66  Pulse: 94    NED. A&Ox3.   No respiratory distress   Abd soft, NT, ND Normal phallus with bilateral descended testicles   Cystoscopy Procedure Note   Patient identification was confirmed, informed consent was obtained, and patient was prepped using Betadine solution.  Lidocaine jelly was administered per urethral meatus.       Pre-Procedure: - Inspection reveals coronal hypospadias   Procedure: The flexible cystoscope was introduced without difficulty.  Distally, visualization was poor but upon aspirating all of his blood-tinged urine and filling with clear saline, visualization was significantly improved. - No urethral strictures/lesions are  present. - Normal prostate  - Normal bladder neck - Bilateral ureteral orifices identified - Bladder mucosa  reveals an area on the right lateral bladder wall which was slightly texturized change less than 1 cm but no obvious papillary change.  Is not particularly erythematous. 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 -Given persistent change along with ongoing bleeding, I recommended proceeding back to the operating room for biopsy of this area.  We discussed risk and benefits including risk of bleeding, infection, damage surrounding structures amongst others.  All questions were answered.  Preoperative urine culture today.   Hollice Espy, MD

## 2023-03-13 NOTE — Discharge Instructions (Addendum)
You have a ureteral stent in place.  This is a tube that extends from your kidney to your bladder.  This may cause urinary bleeding, burning with urination, and urinary frequency.  Please call our office or present to the ED if you develop fevers >101 or pain which is not able to be controlled with oral pain medications.  You may be given either Flomax and/ or ditropan to help with bladder spasms and stent pain in addition to pain medications.    [facility] [address], [address] [place], [place] [address] [phone] AMBULATORY SURGERY  DISCHARGE INSTRUCTIONS   The drugs that you were given will stay in your system until tomorrow so for the next 24 hours you should not:  Drive an automobile Make any legal decisions Drink any alcoholic beverage   You may resume regular meals tomorrow.  Today it is better to start with liquids and gradually work up to solid foods.  You may eat anything you prefer, but it is better to start with liquids, then soup and crackers, and gradually work up to solid foods.   Please notify your doctor immediately if you have any unusual bleeding, trouble breathing, redness and pain at the surgery site, drainage, fever, or pain not relieved by medication.    Additional Instructions:        Please contact your physician with any problems or Same Day Surgery at [phone], Monday through Friday 6 am to 4 pm, or [place] [facility] [place] [facility] number at [phone].  

## 2023-03-13 NOTE — Op Note (Signed)
Date of procedure: 03/13/23  Preoperative diagnosis:  Gross hematuria History of bladder cancer Bladder erythema  Postoperative diagnosis:  Same as above Abnormal left retrograde pyelogram Left upper tract tumor versus clot Bulbar urethral stricture  Procedure: Cystoscopy Right lateral wall bladder biopsy Bilateral retrograde pyelogram Diagnostic left ureteroscopy with upper tract urothelial biopsy Left ureteral stent placement  Surgeon: Hollice Espy, MD  Anesthesia: General  Complications: None  Intraoperative findings: Some subtle erythema on the right lateral bladder wall adjacent to previous biopsy site which was biopsied.  8 [place] none dense bulbar urethral stricture dilated with scope.  Left retrograde pyelogram abnormal with what appeared to be a possible filling defect in the left renal pelvis measuring 1.2 cm dependently as well as possible filling defect in the mid lower pole calyx.  Diagnostic ureteroscopy somewhat limited due to bleeding and poor visualization although there was very suspicious possible mass in the left lower mid pole calyx.  Multiple attempts at biopsying this area were made however technically challenging due to the amount of bleeding and coagulated blood material.  Stent placed without tether.  EBL: Minimal  Specimens: Bladder biopsy, left upper tract urothelial biopsy  Drains: 6 x 24 French double-J ureteral stent on left  Indication: REMBERTO [name] is a 77 y.o. 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  After reviewing the management options for treatment, Trevor [name] elected to proceed with the above surgical procedure(s). We have discussed the potential benefits and risks of the procedure, side effects of the proposed treatment, the likelihood of the patient achieving the goals of the procedure, and any potential problems  that might occur during the procedure or recuperation. Informed consent has been obtained.  Description of procedure:  The patient was taken to the operating room and general anesthesia was induced.  The patient was placed in the dorsal lithotomy position, prepped and draped in the usual sterile fashion, and preoperative antibiotics were administered. A preoperative time-out was performed.   A 21 French scope was advanced per urethra into the bladder.  Notably, Trevor [name] did have a subcoronal hypospadias which was easily passable with the scope as well as an approximately 8 French bulbar urethral stricture which was nontense and using the open-ended ureteral catheter, was able to push through this quite easily with the beak of the scope.  On entry to the bladder the bladder was carefully inspected.  There was an area of very subtle erythema adjacent to right lateral bladder wall biopsy site.  The remainder of the bladder was unremarkable.  These cold cup biopsy forceps to biopsy this area and fulgurated with Bugbee electrocautery until adequate hemostasis was achieved.  Next, attention was turned to the right ureteral orifice.  Was cannulated using a 5 [place] open-ended ureteral catheter.  A gentle retrograde pyelogram on the side revealed no hydroureteronephrosis or filling defects.  Attention was then turned to the right side.  This is also cannulated using a 5 [place] open-ended ureteral catheter.  A gentle retrograde pyelogram on the side however was abnormal.  There was what appeared to be a filling defect in the left renal pelvis dependently measuring about 14 mm in length.  There was also questionable filling defect in the right mid lower pole calyx.  At this time, the decision was made to proceed with diagnostic ureteroscopy.  A Super Stiff wire was placed up to the level of  the kidney.  I then advanced a 7 [place] digital flexible ureteroscope over this wire to the renal pelvis.  No tumor was identified  within the renal pelvis although there was some old blood clots up in the collecting system superiorly.  Navigating into the mid lower pole calyx were filling defect was visualized, there was concern for tumor at this location.  It was relatively nodular but was also covered with clotted material which made visualization very poor.  I brought in Parratto biopsy forceps and tried to biopsy this area multiple times but due to active bleeding, formation of clots and poor visualization, it is unclear if is able to adequately biopsy in this location.  Trevor [name] has multiple maneuvers including the addition of the ureteral access sheath with more aggressive irrigation to try to make visualization more optimal but ultimately was not successful in doing this.  Ultimately I did get several minute fragments of what was felt to be the representative lesion although there was also adjacent clot material as well included in the biopsy specimen.  A final retrograde pyelogram showed a filling defect in this area as well as multiple places throughout the collecting system at this point time presumably representing formation of additional clot with instrumentation.  The safety wire was backloaded over rigid cystoscope and a 6 x 24 French double-J ureteral stent was placed with a full coil in the renal pelvis as well as within the bladder once the wire was removed.  Trevor [name] was cleaned and dried, repositioned in the supine position, reversed from anesthesia, and taken to the PACU in stable condition.  Plan: High suspicion for left upper tract urothelial carcinoma however visualization and biopsy was 4/technically challenging today.  Will have her return next week with pathology results and if nondiagnostic, may need to return for staged procedure.  Hollice Espy, M.D.

## 2023-03-14 ENCOUNTER — Encounter: Payer: Self-pay | Admitting: Urology

## 2023-03-15 LAB — SURGICAL PATHOLOGY

## 2023-03-15 NOTE — Anesthesia Postprocedure Evaluation (Signed)
Anesthesia Post Note  Patient: Trevor [name]  Procedure(s) Performed: CYSTOSCOPY WITH BLADDER BIOPSY (Bladder) CYSTOSCOPY WITH RETROGRADE PYELOGRAM (Bilateral: Bladder) URETEROSCOPY (Left: Ureter) BIOPSY (Left: Renal) URETERAL CATHETER OR STENT PLACEMENT (Left: Ureter)  Patient location during evaluation: PACU Anesthesia Type: General Level of consciousness: awake and alert Pain management: pain level controlled Vital Signs Assessment: post-procedure vital signs reviewed and stable Respiratory status: spontaneous breathing, nonlabored ventilation and respiratory function stable Cardiovascular status: blood pressure returned to baseline and stable Postop Assessment: no apparent nausea or vomiting Anesthetic complications: no   No notable events documented.   Last Vitals:  Vitals:   03/13/23 1515 03/13/23 1522  BP: (!) 158/66 (!) 173/81  Pulse:  62  Resp:  20  Temp: (!) 36.2 C (!) 36.1 C  SpO2:  100%    Last Pain:  Vitals:   03/13/23 1522  TempSrc: Temporal  PainSc: 0-No pain                 [place] Ouch

## 2023-03-21 ENCOUNTER — Encounter: Payer: Self-pay | Admitting: Urology

## 2023-03-21 ENCOUNTER — Other Ambulatory Visit: Payer: Self-pay | Admitting: Urology

## 2023-03-21 ENCOUNTER — Ambulatory Visit (INDEPENDENT_AMBULATORY_CARE_PROVIDER_SITE_OTHER): Payer: Medicare Other | Admitting: Urology

## 2023-03-21 VITALS — BP 125/71 | HR 73 | Ht 67.0 in | Wt 175.0 lb

## 2023-03-21 DIAGNOSIS — N329 Bladder disorder, unspecified: Secondary | ICD-10-CM | POA: Diagnosis not present

## 2023-03-21 DIAGNOSIS — R310 Gross hematuria: Secondary | ICD-10-CM | POA: Diagnosis not present

## 2023-03-21 DIAGNOSIS — N398 Other specified disorders of urinary system: Secondary | ICD-10-CM

## 2023-03-21 DIAGNOSIS — Z8603 Personal history of neoplasm of uncertain behavior: Secondary | ICD-10-CM

## 2023-03-21 LAB — URINALYSIS, COMPLETE
Bilirubin, UA: NEGATIVE
Glucose, UA: NEGATIVE
Ketones, UA: NEGATIVE
Nitrite, UA: NEGATIVE
Specific Gravity, UA: 1.025 (ref 1.005–1.030)
Urobilinogen, Ur: 0.2 mg/dL (ref 0.2–1.0)
pH, UA: 5.5 (ref 5.0–7.5)

## 2023-03-21 LAB — MICROSCOPIC EXAMINATION
RBC, Urine: >30 /HPF — AB (ref 0–2)
WBC, UA: >30 /HPF — AB (ref 0–5)

## 2023-03-21 NOTE — Progress Notes (Signed)
I, Amy L Pierron,acting as a scribe for [name], MD.,have documented all relevant documentation on the behalf of [name], MD,as directed by  [name], MD while in the presence of [name], MD.  03/21/2023 11:04 AM   Dannielle Huh Jeannett Senior [birth year] [id number]  Referring provider: Kandyce Rud, MD [address] [facility] [place],  [place] [address]  Chief Complaint  Patient presents with   Follow-up    HPI: 77 year-old male with a personal history of bladder cancer, including low-grade TA, PUNLMP, and subsequently follicular cystitis with CIS, returns today for a follow-up.  He completed induction BCG and due to a persistent area of erythema returned to the operating room on 03/13/2023. As part of this workup he underwent a bilateral retrograde pyelogram. It showed a filling defect in the left kidney, which was concerning. There was a good amount of bleeding. Visualization was poor but there was concern for a nodular tumor in a left lower pole calyx. 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   He reports having hematuria lately but not as bad today.   PMH: Past Medical History:  Diagnosis Date   Acne    AKI (acute kidney injury)    Allergic rhinitis    Anemia    Anxiety    Aortic stenosis    a.) TTE 10/12/2022: mild AS (MPG 8.2); AVA (VTI) = 1.3 cm2   Bladder cancer    a.) remission since Oct 2014   BPH (benign prostatic hypertrophy) with urinary obstruction    CAD (coronary artery disease)    Carpal tunnel syndrome    Cervicalgia    Chronic low back pain    Cirrhosis    Degenerative disc disease, cervical    Depression    Gastritis    GERD (gastroesophageal reflux disease)    Heart murmur    Hemorrhoids     History of malignant neoplasm of bladder    Hyperlipidemia    Hypertension    Hypokalemia    Hypospadias    Lumbar spondylosis    a.) s/p L2-4 PLIF with L4-5 and L5-S1 interbody fusion   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 Myer Haff   ANTERIOR LAT LUMBAR FUSION N/A 09/11/2019   Procedure: PRONE XLIF L2-3;  Surgeon: Venetia Night, MD;  Location: [facility] ORS;  Service: Neurosurgery;  Laterality: N/A;   BIOPSY Left 03/13/2023   Procedure: BIOPSY;  Surgeon: [name], MD;  Location: [facility] OR[facility];  Service: Urology;  Laterality: Left;   BLADDER SURGERY     2011, 2013, 2015   COLONOSCOPY WITH PROPOFOL N/A 07/31/2015   Procedure: COLONOSCOPY WITH PROPOFOL;  Surgeon: Scot Jun, MD;  Location: [facility] ENDOSCOPY;  Service: Endoscopy;  Laterality: N/A;   COLONOSCOPY, ESOPHAGOGASTRODUODENOSCOPY (EGD) AND ESOPHAGEAL DILATION  2006   CYSTOSCOPY  W/ RETROGRADES Bilateral 11/11/2021   Procedure: CYSTOSCOPY WITH RETROGRADE PYELOGRAM;  Surgeon: Vanna ScotlandBrandon, Josalyn Dettmann, MD;  Location: [facility] OR[facility];  Service: Urology;  Laterality: Bilateral;   CYSTOSCOPY W/ RETROGRADES Bilateral 03/13/2023   Procedure: CYSTOSCOPY WITH RETROGRADE PYELOGRAM;  Surgeon: Vanna ScotlandBrandon, Kevina Piloto, MD;  Location: [facility] ORS;  Service: Urology;  Laterality: Bilateral;   CYSTOSCOPY WITH BIOPSY N/A 11/11/2021   Procedure: CYSTOSCOPY WITH BLADDER BIOPSY;  Surgeon: Vanna ScotlandBrandon, Julena Barbour, MD;  Location: [facility] OR[facility];  Service: Urology;  Laterality: N/A;   CYSTOSCOPY WITH BIOPSY N/A 03/13/2023   Procedure: CYSTOSCOPY WITH BLADDER BIOPSY;   Surgeon: Vanna ScotlandBrandon, Chantell Kunkler, MD;  Location: [facility];  Service: Urology;  Laterality: N/A;   FLEXIBLE SIGMOIDOSCOPY  1994   LUMBAR LAMINECTOMY/DECOMPRESSION MICRODISCECTOMY N/A 06/20/2018   Procedure: LUMBAR LAMINECTOMY/DECOMPRESSION MICRODISCECTOMY 1 LEVEL-L-2-3;  Surgeon: Venetia NightYarbrough, Chester, MD;  Location: [facility] OR[facility];  Service: Neurosurgery;  Laterality: N/A;   POSTERIOR FUSION LUMBAR SPINE  10/02/2019   L2-4   POSTERIOR LUMBAR FUSION 4 WITH HARDWARE REMOVAL N/A 09/11/2019   Procedure: L2-S1 POSTERIOR FUSION, ABORTED;  Surgeon: Venetia NightYarbrough, Chester, MD;  Location: [facility] ORS;  Service: Neurosurgery;  Laterality: N/A;   SEPTOPLASTY  1996   TONSILLECTOMY     URETEROSCOPY Left 03/13/2023   Procedure: URETEROSCOPY;  Surgeon: Vanna ScotlandBrandon, Malynda Smolinski, MD;  Location: [facility] ORS;  Service: Urology;  Laterality: Left;    Home Medications:  Allergies as of 03/21/2023       Reactions   Ceftriaxone Hives   Tolerated 1st generation cephalosporin (CEFAZOLIN) on 06/20/2018 and 09/11/2019 without documented ADRs. Previously tolerated amoxicillin Blisters and Hives around mouth and neck.  Some blistering of lips.  Mouth not effected.  Patient started on ceftriaxone and doxycyline 1-2 days prior to reaction.     Doxycycline Hives   Blisters and Hives around mouth and neck.  Some blistering of lips.  Mouth not effected. Patient started on ceftriaxone and doxycyline 1-2 days prior to reaction.     Macrobid [nitrofurantoin] Rash        Medication List        Accurate as of March 21, 2023 11:04 AM. If you have any questions, ask your nurse or doctor.          STOP taking these medications    HYDROcodone-acetaminophen 5-325 MG tablet Commonly known as: NORCO/VICODIN Stopped by: Vanna ScotlandAshley Francois Elk, MD       TAKE these medications    acetaminophen 650 MG CR tablet Commonly known as: TYLENOL Take 1,300 mg by mouth every 8 (eight) hours as needed for pain.   aspirin EC 81 MG tablet Take 81 mg by mouth daily.    carvedilol 3.125 MG tablet Commonly known as: COREG Take 1 tablet (3.125 mg total) by mouth 2 (two) times daily with a meal.   cyanocobalamin 500 MCG tablet Commonly known as: VITAMIN B12 Take 500 mcg by mouth daily.   finasteride 5 MG tablet Commonly known as: PROSCAR Take 1 tablet (5 mg total) by mouth daily. What changed: when to take this   fluticasone 50 MCG/ACT nasal spray Commonly known as: FLONASE Place 2 sprays into both nostrils daily as needed for allergies.   IRON PO Take 1 tablet by mouth daily.   levothyroxine 25 MCG tablet Commonly known as: SYNTHROID Take 25 mcg by mouth daily before breakfast.   lisinopril-hydrochlorothiazide 20-25 MG tablet Commonly known as: ZESTORETIC Take 0.5 tablets by mouth every morning.   multivitamin with minerals Tabs tablet Take 1 tablet by mouth daily.   naproxen sodium 220 MG  tablet Commonly known as: ALEVE Take 220 mg by mouth 2 (two) times daily as needed (pain).   Omeprazole 20 MG Tbec Take 20 mg by mouth every morning.   OVER THE COUNTER MEDICATION Take 2 tablets by mouth daily. Arthritis/Joint supplement, Omega XL   oxybutynin 5 MG tablet Commonly known as: DITROPAN Take 1 tablet (5 mg total) by mouth every 8 (eight) hours as needed for bladder spasms.   senna-docusate 8.6-50 MG tablet Commonly known as: Senokot-S Take 2 tablets by mouth at bedtime as needed for mild constipation.   simvastatin 40 MG tablet Commonly known as: ZOCOR Take 40 mg by mouth every morning.   tamsulosin 0.4 MG Caps capsule Commonly known as: FLOMAX Take 1 capsule (0.4 mg total) by mouth daily. What changed: when to take this   traZODone 100 MG tablet Commonly known as: DESYREL Take 100 mg by mouth at bedtime.        Allergies:  Allergies  Allergen Reactions   Ceftriaxone Hives    Tolerated 1st generation cephalosporin (CEFAZOLIN) on 06/20/2018 and 09/11/2019 without documented ADRs. Previously tolerated  amoxicillin  Blisters and Hives around mouth and neck.  Some blistering of lips.  Mouth not effected.  Patient started on ceftriaxone and doxycyline 1-2 days prior to reaction.     Doxycycline Hives    Blisters and Hives around mouth and neck.  Some blistering of lips.  Mouth not effected. Patient started on ceftriaxone and doxycyline 1-2 days prior to reaction.     Macrobid [Nitrofurantoin] Rash    Family History: Family History  Problem Relation Age of Onset   Hyperlipidemia Brother    Hyperlipidemia Sister    Heart attack Father     Social History:  reports that he has never smoked. He has never been exposed to tobacco smoke. He has never used smokeless tobacco. He reports that he does not drink alcohol and does not use drugs.   Physical Exam: BP 125/71   Pulse 73   Ht 5\' 7"  (1.702 m)   Wt 175 lb (79.4 kg)   BMI 27.41 kg/m   Constitutional:  Alert and oriented, No acute distress. HEENT: [place] AT, moist mucus membranes.  Trachea midline, no masses. Neurologic: Grossly intact, no focal deficits, moving all 4 extremities. Psychiatric: Normal mood and affect.   Pertinent Imaging: Surgical pathology 03/13/2023  Personally reviewed images today.   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  - He is in agreement with this option in order to guide treatment -Risk and benefits reviewed again today including risk of bleeding, infection, damage surrounding structures amongst others. - Would like to do the procedure in the next two weeks.  - Pre-op urine done today.  I have reviewed the above documentation for accuracy and completeness, and I agree with the above.   [name], MD   [facility] [address] [place], [place] [address] [phone]

## 2023-03-21 NOTE — H&P (View-Only) (Signed)
 I, Amy L Pierron,acting as a scribe for Raydin Bielinski, MD.,have documented all relevant documentation on the behalf of Aidin Doane, MD,as directed by  Eldene Plocher, MD while in the presence of Lolly Glaus, MD.  03/21/2023 11:04 AM   Trevor [name] 02/05/1946 [id number]  Referring provider: Babaoff, Marcus, MD [address] [facility] [place] [facility] [place],  [place] [address]  Chief Complaint  Patient presents with   Follow-up    HPI: 77 year-old male with a personal history of bladder cancer, including low-grade TA, PUNLMP, and subsequently follicular cystitis with CIS, returns today for a follow-up.  He completed induction BCG and due to a persistent area of erythema returned to the operating room on 03/13/2023. As part of this workup he underwent a bilateral retrograde pyelogram. It showed a filling defect in the left kidney, which was concerning. There was a good amount of bleeding. Visualization was poor but there was concern for a nodular tumor in a left lower pole calyx. 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   He reports having hematuria lately but not as bad today.   PMH: Past Medical History:  Diagnosis Date   Acne    AKI (acute kidney injury)    Allergic rhinitis    Anemia    Anxiety    Aortic stenosis    a.) TTE 10/12/2022: mild AS (MPG 8.2); AVA (VTI) = 1.3 cm2   Bladder cancer    a.) remission since Oct 2014   BPH (benign prostatic hypertrophy) with urinary obstruction    CAD (coronary artery disease)    Carpal tunnel syndrome    Cervicalgia    Chronic low back pain    Cirrhosis    Degenerative disc disease, cervical    Depression    Gastritis    GERD (gastroesophageal reflux disease)    Heart murmur    Hemorrhoids     History of malignant neoplasm of bladder    Hyperlipidemia    Hypertension    Hypokalemia    Hypospadias    Lumbar spondylosis    a.) s/p L2-4 PLIF with L4-5 and L5-S1 interbody fusion   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 Yarbrough   ANTERIOR LAT LUMBAR FUSION N/A 09/11/2019   Procedure: PRONE XLIF L2-3;  Surgeon: Yarbrough, Chester, MD;  Location: [facility];  Service: Neurosurgery;  Laterality: N/A;   BIOPSY Left 03/13/2023   Procedure: BIOPSY;  Surgeon: Louis Gaw, MD;  Location: [facility];  Service: Urology;  Laterality: Left;   BLADDER SURGERY     2011, 2013, 2015   COLONOSCOPY WITH PROPOFOL N/A 07/31/2015   Procedure: COLONOSCOPY WITH PROPOFOL;  Surgeon: Robert T Elliott, MD;  Location: [facility];  Service: Endoscopy;  Laterality: N/A;   COLONOSCOPY, ESOPHAGOGASTRODUODENOSCOPY (EGD) AND ESOPHAGEAL DILATION  2006   CYSTOSCOPY   W/ RETROGRADES Bilateral 11/11/2021   Procedure: CYSTOSCOPY WITH RETROGRADE PYELOGRAM;  Surgeon: Keyshia Orwick, MD;  Location: [facility];  Service: Urology;  Laterality: Bilateral;   CYSTOSCOPY W/ RETROGRADES Bilateral 03/13/2023   Procedure: CYSTOSCOPY WITH RETROGRADE PYELOGRAM;  Surgeon: Shamell Suarez, MD;  Location: [facility];  Service: Urology;  Laterality: Bilateral;   CYSTOSCOPY WITH BIOPSY N/A 11/11/2021   Procedure: CYSTOSCOPY WITH BLADDER BIOPSY;  Surgeon: Rileigh Kawashima, MD;  Location: [facility];  Service: Urology;  Laterality: N/A;   CYSTOSCOPY WITH BIOPSY N/A 03/13/2023   Procedure: CYSTOSCOPY WITH BLADDER BIOPSY;   Surgeon: Sharese Manrique, MD;  Location: [facility];  Service: Urology;  Laterality: N/A;   FLEXIBLE SIGMOIDOSCOPY  1994   LUMBAR LAMINECTOMY/DECOMPRESSION MICRODISCECTOMY N/A 06/20/2018   Procedure: LUMBAR LAMINECTOMY/DECOMPRESSION MICRODISCECTOMY 1 LEVEL-L-2-3;  Surgeon: Yarbrough, Chester, MD;  Location: [facility];  Service: Neurosurgery;  Laterality: N/A;   POSTERIOR FUSION LUMBAR SPINE  10/02/2019   L2-4   POSTERIOR LUMBAR FUSION 4 WITH HARDWARE REMOVAL N/A 09/11/2019   Procedure: L2-S1 POSTERIOR FUSION, ABORTED;  Surgeon: Yarbrough, Chester, MD;  Location: [facility];  Service: Neurosurgery;  Laterality: N/A;   SEPTOPLASTY  1996   TONSILLECTOMY     URETEROSCOPY Left 03/13/2023   Procedure: URETEROSCOPY;  Surgeon: Mayan Dolney, MD;  Location: [facility];  Service: Urology;  Laterality: Left;    Home Medications:  Allergies as of 03/21/2023       Reactions   Ceftriaxone Hives   Tolerated 1st generation cephalosporin (CEFAZOLIN) on 06/20/2018 and 09/11/2019 without documented ADRs. Previously tolerated amoxicillin Blisters and Hives around mouth and neck.  Some blistering of lips.  Mouth not effected.  Patient started on ceftriaxone and doxycyline 1-2 days prior to reaction.     Doxycycline Hives   Blisters and Hives around mouth and neck.  Some blistering of lips.  Mouth not effected. Patient started on ceftriaxone and doxycyline 1-2 days prior to reaction.     Macrobid [nitrofurantoin] Rash        Medication List        Accurate as of March 21, 2023 11:04 AM. If you have any questions, ask your nurse or doctor.          STOP taking these medications    HYDROcodone-acetaminophen 5-325 MG tablet Commonly known as: NORCO/VICODIN Stopped by: Linnaea Ahn, MD       TAKE these medications    acetaminophen 650 MG CR tablet Commonly known as: TYLENOL Take 1,300 mg by mouth every 8 (eight) hours as needed for pain.   aspirin EC 81 MG tablet Take 81 mg by mouth daily.    carvedilol 3.125 MG tablet Commonly known as: COREG Take 1 tablet (3.125 mg total) by mouth 2 (two) times daily with a meal.   cyanocobalamin 500 MCG tablet Commonly known as: VITAMIN B12 Take 500 mcg by mouth daily.   finasteride 5 MG tablet Commonly known as: PROSCAR Take 1 tablet (5 mg total) by mouth daily. What changed: when to take this   fluticasone 50 MCG/ACT nasal spray Commonly known as: FLONASE Place 2 sprays into both nostrils daily as needed for allergies.   IRON PO Take 1 tablet by mouth daily.   levothyroxine 25 MCG tablet Commonly known as: SYNTHROID Take 25 mcg by mouth daily before breakfast.   lisinopril-hydrochlorothiazide 20-25 MG tablet Commonly known as: ZESTORETIC Take 0.5 tablets by mouth every morning.   multivitamin with minerals Tabs tablet Take 1 tablet by mouth daily.   naproxen sodium 220 MG   tablet Commonly known as: ALEVE Take 220 mg by mouth 2 (two) times daily as needed (pain).   Omeprazole 20 MG Tbec Take 20 mg by mouth every morning.   OVER THE COUNTER MEDICATION Take 2 tablets by mouth daily. Arthritis/Joint supplement, Omega XL   oxybutynin 5 MG tablet Commonly known as: DITROPAN Take 1 tablet (5 mg total) by mouth every 8 (eight) hours as needed for bladder spasms.   senna-docusate 8.6-50 MG tablet Commonly known as: Senokot-S Take 2 tablets by mouth at bedtime as needed for mild constipation.   simvastatin 40 MG tablet Commonly known as: ZOCOR Take 40 mg by mouth every morning.   tamsulosin 0.4 MG Caps capsule Commonly known as: FLOMAX Take 1 capsule (0.4 mg total) by mouth daily. What changed: when to take this   traZODone 100 MG tablet Commonly known as: DESYREL Take 100 mg by mouth at bedtime.        Allergies:  Allergies  Allergen Reactions   Ceftriaxone Hives    Tolerated 1st generation cephalosporin (CEFAZOLIN) on 06/20/2018 and 09/11/2019 without documented ADRs. Previously tolerated  amoxicillin  Blisters and Hives around mouth and neck.  Some blistering of lips.  Mouth not effected.  Patient started on ceftriaxone and doxycyline 1-2 days prior to reaction.     Doxycycline Hives    Blisters and Hives around mouth and neck.  Some blistering of lips.  Mouth not effected. Patient started on ceftriaxone and doxycyline 1-2 days prior to reaction.     Macrobid [Nitrofurantoin] Rash    Family History: Family History  Problem Relation Age of Onset   Hyperlipidemia Brother    Hyperlipidemia Sister    Heart attack Father     Social History:  reports that he has never smoked. He has never been exposed to tobacco smoke. He has never used smokeless tobacco. He reports that he does not drink alcohol and does not use drugs.   Physical Exam: BP 125/71   Pulse 73   Ht 5' 7" (1.702 m)   Wt 175 lb (79.4 kg)   BMI 27.41 kg/m   Constitutional:  Alert and oriented, No acute distress. HEENT: [place] AT, moist mucus membranes.  Trachea midline, no masses. Neurologic: Grossly intact, no focal deficits, moving all 4 extremities. Psychiatric: Normal mood and affect.   Pertinent Imaging: Surgical pathology 03/13/2023  Personally reviewed images today.   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  - He is in agreement with this option in order to guide treatment -Risk and benefits reviewed again today including risk of bleeding, infection, damage surrounding structures amongst others. - Would like to do the procedure in the next two weeks.  - Pre-op urine done today.  I have reviewed the above documentation for accuracy and completeness, and I agree with the above.   Ambera Fedele, MD   [facility] [address], [address] [place], [place] [address] [phone]   

## 2023-03-21 NOTE — Progress Notes (Signed)
Surgical Physician Order Form [facility] [place]  * Scheduling expectation : ~2 weekss  *Length of Case:   *Clearance needed: no  *Anticoagulation Instructions: Hold all anticoagulants  *Aspirin Instructions: Hold Aspirin  *Post-op visit Date/Instructions:   TBD  *Diagnosis:  Possible left upper tract urothelial mass, history of bladder cancer  *Procedure: left  diagnostic ureteroscopy with biopsy, left retrograde pyelogram, left ureteral stent exchange, possible laser ablation of tumor   Additional orders: N/A  -Admit type: OUTpatient  -Anesthesia: General  -VTE Prophylaxis Standing Order SCD's       Other:   -Standing Lab Orders Per Anesthesia    Lab other: None  -Standing Test orders EKG/Chest x-ray per Anesthesia       Test other:   - Medications:  Ancef 2gm IV  -Other orders:  N/A

## 2023-03-24 ENCOUNTER — Telehealth: Payer: Self-pay

## 2023-03-24 NOTE — Progress Notes (Signed)
   [facility]- Surgical Posting Form  Surgery Date: Date: 04/10/2023  Surgeon: Dr. [name], MD  Inpt ( No  )   Outpt (Yes)   Obs ( No  )   Diagnosis: N39.8 Left Upper Tract Urothelial Mass, Z86.03 History of Bladder Cancer   -CPT: 52351, 74420, 91675, [id number]  Surgery: Left Diagnostic Ureteroscopy with Left Ureteral Biopsy, Left Retrograde Pyelogram, Left Ureteral Stent Exchange, Possible Laser Ablation of Tumor  Stop Anticoagulations: Yes and will need to hold ASA  Cardiac/Medical/Pulmonary Clearance needed: no  *Orders entered into EPIC  Date: 03/24/23   *Case booked in [place]  Date: 03/22/2023  *Notified pt of Surgery: Date: 03/22/2023  PRE-OP UA & CX: no  *Placed into Prior Authorization Work Que Date: 03/24/23  Assistant/laser/rep:No

## 2023-03-24 NOTE — Telephone Encounter (Signed)
I spoke with Mr. Ruby. We have discussed possible surgery dates and Monday April 29th, 2024 was agreed upon by all parties. Patient given information about surgery date, what to expect pre-operatively and post operatively.  We discussed that a Pre-Admission Testing office will be calling to set up the pre-op visit that will take place prior to surgery, and that these appointments are typically done over the phone with a Pre-Admissions RN. Informed patient that our office will communicate any additional care to be provided after surgery. Patients questions or concerns were discussed during our call. Advised to call our office should there be any additional information, questions or concerns that arise. Patient verbalized understanding.

## 2023-03-26 LAB — CULTURE, URINE COMPREHENSIVE

## 2023-03-31 ENCOUNTER — Encounter
Admission: RE | Admit: 2023-03-31 | Discharge: 2023-03-31 | Disposition: A | Discharge status: Home / Self Care | Payer: Medicare Other | Source: Ambulatory Visit | Attending: Urology | Admitting: Urology

## 2023-03-31 DIAGNOSIS — Z79899 Other long term (current) drug therapy: Secondary | ICD-10-CM

## 2023-03-31 DIAGNOSIS — Z01812 Encounter for preprocedural laboratory examination: Secondary | ICD-10-CM

## 2023-03-31 DIAGNOSIS — I10 Essential (primary) hypertension: Secondary | ICD-10-CM

## 2023-03-31 NOTE — Patient Instructions (Addendum)
Your procedure is scheduled on:04-10-23 Monday Report to the Registration Desk on the 1st floor of the Medical Mall.Then proceed to the 2nd floor Surgery Desk To find out your arrival time, please call [phone] between 1PM - 3PM on:04-07-23 Friday If your arrival time is 6:00 am, do not arrive before that time as the Medical Mall entrance doors do not open until 6:00 am.  REMEMBER: Instructions that are not followed completely may result in serious medical risk, up to and including death; or upon the discretion of your surgeon and anesthesiologist your surgery may need to be rescheduled.  Do not eat food OR drink any liquids after midnight the night before surgery.  No gum chewing or hard candies.  One week prior to surgery:Last  dose on 04-02-23 Stop Anti-inflammatories (NSAIDS) such as Advil, Aleve, Ibuprofen, Motrin, Naproxen, Naprosyn and Aspirin based products such as Excedrin, Goody's Powder, BC Powder.You may however, continue to take Tylenol if needed for pain up until the day of surgery. Stop ANY OVER THE COUNTER supplements/vitamins 7 days prior to surgery (Vitamin D, Multivitamin, Omega XL, Vitamin B 12)-Continue your Iron pill up until the day prior to surgery  Stop your 81 mg Aspirin as instructed by Dr Delana Meyer office 5 days prior to surgery-Last dose will be on 04-04-23 Tuesday  TAKE ONLY THESE MEDICATIONS THE MORNING OF SURGERY WITH A SIP OF WATER: -carvedilol (COREG)  -finasteride (PROSCAR)  -levothyroxine (SYNTHROID)  -simvastatin (ZOCOR)  -tamsulosin (FLOMAX)  -Omeprazole-take one the night before and one on the morning of surgery - helps to prevent nausea after surgery.)  Continue your lisinopril-hydrochlorothiazide (ZESTORETIC) up until the day prior to surgery but do NOT take the morning of surgery  No Alcohol for 24 hours before or after surgery.  No Smoking including e-cigarettes for 24 hours before surgery.  No chewable tobacco products for at least 6 hours  before surgery.  No nicotine patches on the day of surgery.  Do not use any "recreational" drugs for at least a week (preferably 2 weeks) before your surgery.  Please be advised that the combination of cocaine and anesthesia may have negative outcomes, up to and including death. If you test positive for cocaine, your surgery will be cancelled.  On the morning of surgery brush your teeth with toothpaste and water, you may rinse your mouth with mouthwash if you wish. Do not swallow any toothpaste or mouthwash.  Do not wear jewelry, make-up, hairpins, clips or nail polish.  Do not wear lotions, powders, or perfumes.   Do not shave body hair from the neck down 48 hours before surgery.  Contact lenses, hearing aids and dentures may not be worn into surgery.  Do not bring valuables to the hospital. [facility] is not responsible for any missing/lost belongings or valuables.   Notify your doctor if there is any change in your medical condition (cold, fever, infection).  Wear comfortable clothing (specific to your surgery type) to the hospital.  After surgery, you can help prevent lung complications by doing breathing exercises.  Take deep breaths and cough every 1-2 hours. Your doctor may order a device called an Incentive Spirometer to help you take deep breaths. When coughing or sneezing, hold a pillow firmly against your incision with both hands. This is called "splinting." Doing this helps protect your incision. It also decreases belly discomfort.  If you are being admitted to the hospital overnight, leave your suitcase in the car. After surgery it may be brought to your room.  In case of increased patient census, it may be necessary for you, the patient, to continue your postoperative care in the Same Day Surgery department.  If you are being discharged the day of surgery, you will not be allowed to drive home. You will need a responsible individual to drive you home and stay with you  for 24 hours after surgery.   If you are taking public transportation, you will need to have a responsible individual with you.  Please call the Pre-admissions Testing Dept. at [phone] if you have any questions about these instructions.  Surgery Visitation Policy:  Patients having surgery or a procedure may have two visitors.  Children under the age of 78 must have an adult with them who is not the patient.

## 2023-04-03 ENCOUNTER — Encounter: Payer: Self-pay | Admitting: Urgent Care

## 2023-04-03 ENCOUNTER — Encounter
Admission: RE | Admit: 2023-04-03 | Discharge: 2023-04-03 | Disposition: A | Discharge status: Home / Self Care | Payer: Medicare Other | Source: Ambulatory Visit | Attending: Urology | Admitting: Urology

## 2023-04-03 ENCOUNTER — Telehealth: Payer: Self-pay

## 2023-04-03 DIAGNOSIS — Z01810 Encounter for preprocedural cardiovascular examination: Secondary | ICD-10-CM | POA: Diagnosis not present

## 2023-04-03 DIAGNOSIS — Z79899 Other long term (current) drug therapy: Secondary | ICD-10-CM | POA: Diagnosis not present

## 2023-04-03 DIAGNOSIS — Z01812 Encounter for preprocedural laboratory examination: Secondary | ICD-10-CM

## 2023-04-03 DIAGNOSIS — I10 Essential (primary) hypertension: Secondary | ICD-10-CM | POA: Insufficient documentation

## 2023-04-03 DIAGNOSIS — Z01818 Encounter for other preprocedural examination: Secondary | ICD-10-CM | POA: Diagnosis present

## 2023-04-03 LAB — BASIC METABOLIC PANEL
Anion gap: 9 (ref 5–15)
BUN: 20 mg/dL (ref 8–23)
CO2: 26 mmol/L (ref 22–32)
Calcium: 9.1 mg/dL (ref 8.9–10.3)
Chloride: 102 mmol/L (ref 98–111)
Creatinine, Ser: 1.32 mg/dL — ABNORMAL HIGH (ref 0.61–1.24)
GFR, Estimated: 56 mL/min — ABNORMAL LOW (ref >60)
Glucose, Bld: 110 mg/dL — ABNORMAL HIGH (ref 70–99)
Potassium: 4.1 mmol/L (ref 3.5–5.1)
Sodium: 137 mmol/L (ref 135–145)

## 2023-04-03 MED ORDER — CIPROFLOXACIN HCL 500 MG PO TABS: 500.0000 mg | ORAL_TABLET | Freq: Two times a day (BID) | ORAL | 0 refills | Status: DC

## 2023-04-03 NOTE — Telephone Encounter (Signed)
-----   Message from [name], MD sent at 03/27/2023  9:58 AM EDT ----- Please pretreat with Cipro 500 mg twice daily for 5 days starting 3 days before the procedure.  [name], MD

## 2023-04-03 NOTE — Telephone Encounter (Signed)
Called patient and left detailed message on machine advising him of results. I have sent medication over to [facility] in [place]. Encouraged patient to call with any questions or concerns.

## 2023-04-09 MED ORDER — CHLORHEXIDINE GLUCONATE 0.12 % MT SOLN
15.0000 mL | Freq: Once | OROMUCOSAL | Status: AC
  Administered 2023-04-10: 15 mL via OROMUCOSAL

## 2023-04-09 MED ORDER — CEFAZOLIN SODIUM-DEXTROSE 2-4 GM/100ML-% IV SOLN
2.0000 g | INTRAVENOUS | Status: AC
  Administered 2023-04-10: 2 g via INTRAVENOUS

## 2023-04-09 MED ORDER — ORAL CARE MOUTH RINSE: 15.0000 mL | Freq: Once | OROMUCOSAL | Status: AC

## 2023-04-09 MED ORDER — LACTATED RINGERS IV SOLN: INTRAVENOUS | Status: DC

## 2023-04-10 ENCOUNTER — Ambulatory Visit: Payer: Medicare Other | Admitting: Urgent Care

## 2023-04-10 ENCOUNTER — Encounter: Payer: Self-pay | Admitting: Urology

## 2023-04-10 ENCOUNTER — Other Ambulatory Visit: Payer: Self-pay

## 2023-04-10 ENCOUNTER — Ambulatory Visit: Payer: Medicare Other

## 2023-04-10 ENCOUNTER — Ambulatory Visit
Admission: RE | Admit: 2023-04-10 | Discharge: 2023-04-10 | Disposition: A | Discharge status: Home / Self Care | Payer: Medicare Other | Attending: Urology | Admitting: Urology

## 2023-04-10 ENCOUNTER — Ambulatory Visit: Payer: Medicare Other | Admitting: Anesthesiology

## 2023-04-10 ENCOUNTER — Encounter
Admission: RE | Disposition: A | Discharge status: Home / Self Care | Payer: Self-pay | Source: Home / Self Care | Attending: Urology

## 2023-04-10 DIAGNOSIS — K219 Gastro-esophageal reflux disease without esophagitis: Secondary | ICD-10-CM | POA: Insufficient documentation

## 2023-04-10 DIAGNOSIS — C642 Malignant neoplasm of left kidney, except renal pelvis: Secondary | ICD-10-CM

## 2023-04-10 DIAGNOSIS — D696 Thrombocytopenia, unspecified: Secondary | ICD-10-CM | POA: Diagnosis not present

## 2023-04-10 DIAGNOSIS — N398 Other specified disorders of urinary system: Secondary | ICD-10-CM

## 2023-04-10 DIAGNOSIS — K7460 Unspecified cirrhosis of liver: Secondary | ICD-10-CM | POA: Diagnosis not present

## 2023-04-10 DIAGNOSIS — M1990 Unspecified osteoarthritis, unspecified site: Secondary | ICD-10-CM | POA: Insufficient documentation

## 2023-04-10 DIAGNOSIS — D649 Anemia, unspecified: Secondary | ICD-10-CM | POA: Insufficient documentation

## 2023-04-10 DIAGNOSIS — I10 Essential (primary) hypertension: Secondary | ICD-10-CM | POA: Diagnosis not present

## 2023-04-10 DIAGNOSIS — N401 Enlarged prostate with lower urinary tract symptoms: Secondary | ICD-10-CM | POA: Diagnosis not present

## 2023-04-10 DIAGNOSIS — F419 Anxiety disorder, unspecified: Secondary | ICD-10-CM | POA: Insufficient documentation

## 2023-04-10 DIAGNOSIS — R7303 Prediabetes: Secondary | ICD-10-CM | POA: Diagnosis not present

## 2023-04-10 DIAGNOSIS — F32A Depression, unspecified: Secondary | ICD-10-CM | POA: Insufficient documentation

## 2023-04-10 DIAGNOSIS — G8929 Other chronic pain: Secondary | ICD-10-CM | POA: Diagnosis not present

## 2023-04-10 DIAGNOSIS — I081 Rheumatic disorders of both mitral and tricuspid valves: Secondary | ICD-10-CM | POA: Insufficient documentation

## 2023-04-10 DIAGNOSIS — I517 Cardiomegaly: Secondary | ICD-10-CM | POA: Insufficient documentation

## 2023-04-10 DIAGNOSIS — Z8551 Personal history of malignant neoplasm of bladder: Secondary | ICD-10-CM | POA: Insufficient documentation

## 2023-04-10 DIAGNOSIS — R310 Gross hematuria: Secondary | ICD-10-CM | POA: Diagnosis not present

## 2023-04-10 DIAGNOSIS — N138 Other obstructive and reflux uropathy: Secondary | ICD-10-CM | POA: Insufficient documentation

## 2023-04-10 DIAGNOSIS — E785 Hyperlipidemia, unspecified: Secondary | ICD-10-CM | POA: Insufficient documentation

## 2023-04-10 DIAGNOSIS — R350 Frequency of micturition: Secondary | ICD-10-CM | POA: Insufficient documentation

## 2023-04-10 DIAGNOSIS — M549 Dorsalgia, unspecified: Secondary | ICD-10-CM | POA: Insufficient documentation

## 2023-04-10 DIAGNOSIS — Z8603 Personal history of neoplasm of uncertain behavior: Secondary | ICD-10-CM

## 2023-04-10 HISTORY — PX: CYSTOSCOPY W/ RETROGRADES: SHX1426

## 2023-04-10 HISTORY — PX: URETERAL BIOPSY: SHX6688

## 2023-04-10 HISTORY — PX: URETEROSCOPY: SHX842

## 2023-04-10 HISTORY — PX: CYSTOSCOPY W/ URETERAL STENT PLACEMENT: SHX1429

## 2023-04-10 SURGERY — URETEROSCOPY
Anesthesia: General | Laterality: Left

## 2023-04-10 MED ORDER — PHENYLEPHRINE 80 MCG/ML (10ML) SYRINGE FOR IV PUSH (FOR BLOOD PRESSURE SUPPORT)
PREFILLED_SYRINGE | INTRAVENOUS | Status: AC
  Filled 2023-04-10: qty 10

## 2023-04-10 MED ORDER — ONDANSETRON HCL 4 MG/2ML IJ SOLN
INTRAMUSCULAR | Status: DC | PRN
  Administered 2023-04-10: 4 mg via INTRAVENOUS

## 2023-04-10 MED ORDER — LIDOCAINE HCL (PF) 2 % IJ SOLN
INTRAMUSCULAR | Status: AC
  Filled 2023-04-10: qty 5

## 2023-04-10 MED ORDER — PROPOFOL 10 MG/ML IV BOLUS
INTRAVENOUS | Status: AC
  Filled 2023-04-10: qty 20

## 2023-04-10 MED ORDER — PHENYLEPHRINE 80 MCG/ML (10ML) SYRINGE FOR IV PUSH (FOR BLOOD PRESSURE SUPPORT)
PREFILLED_SYRINGE | INTRAVENOUS | Status: DC | PRN
  Administered 2023-04-10: 80 ug via INTRAVENOUS

## 2023-04-10 MED ORDER — LIDOCAINE HCL (CARDIAC) PF 100 MG/5ML IV SOSY
PREFILLED_SYRINGE | INTRAVENOUS | Status: DC | PRN
  Administered 2023-04-10: 50 mg via INTRAVENOUS

## 2023-04-10 MED ORDER — FENTANYL CITRATE (PF) 100 MCG/2ML IJ SOLN
INTRAMUSCULAR | Status: DC | PRN
  Administered 2023-04-10 (×2): 50 ug via INTRAVENOUS

## 2023-04-10 MED ORDER — OXYCODONE HCL 5 MG PO TABS: 5.0000 mg | ORAL_TABLET | Freq: Once | ORAL | Status: DC | PRN

## 2023-04-10 MED ORDER — OXYCODONE HCL 5 MG/5ML PO SOLN: 5.0000 mg | Freq: Once | ORAL | Status: DC | PRN

## 2023-04-10 MED ORDER — ACETAMINOPHEN 10 MG/ML IV SOLN
INTRAVENOUS | Status: DC | PRN
  Administered 2023-04-10: 1000 mg via INTRAVENOUS

## 2023-04-10 MED ORDER — PROMETHAZINE HCL 25 MG/ML IJ SOLN: 6.2500 mg | INTRAMUSCULAR | Status: DC | PRN

## 2023-04-10 MED ORDER — EPHEDRINE 5 MG/ML INJ
INTRAVENOUS | Status: AC
  Filled 2023-04-10: qty 5

## 2023-04-10 MED ORDER — ROCURONIUM BROMIDE 10 MG/ML (PF) SYRINGE
PREFILLED_SYRINGE | INTRAVENOUS | Status: AC
  Filled 2023-04-10: qty 10

## 2023-04-10 MED ORDER — DEXAMETHASONE SODIUM PHOSPHATE 10 MG/ML IJ SOLN
INTRAMUSCULAR | Status: AC
  Filled 2023-04-10: qty 1

## 2023-04-10 MED ORDER — ACETAMINOPHEN 10 MG/ML IV SOLN
INTRAVENOUS | Status: AC
  Filled 2023-04-10: qty 100

## 2023-04-10 MED ORDER — DROPERIDOL 2.5 MG/ML IJ SOLN: 0.6250 mg | Freq: Once | INTRAMUSCULAR | Status: DC | PRN

## 2023-04-10 MED ORDER — ROCURONIUM BROMIDE 100 MG/10ML IV SOLN
INTRAVENOUS | Status: DC | PRN
  Administered 2023-04-10: 50 mg via INTRAVENOUS
  Administered 2023-04-10: 10 mg via INTRAVENOUS

## 2023-04-10 MED ORDER — CEFAZOLIN SODIUM-DEXTROSE 2-4 GM/100ML-% IV SOLN
INTRAVENOUS | Status: AC
  Filled 2023-04-10: qty 100

## 2023-04-10 MED ORDER — HYDROCODONE-ACETAMINOPHEN 5-325 MG PO TABS: 1.0000 | ORAL_TABLET | Freq: Four times a day (QID) | ORAL | 0 refills | Status: DC | PRN

## 2023-04-10 MED ORDER — DEXAMETHASONE SODIUM PHOSPHATE 10 MG/ML IJ SOLN
INTRAMUSCULAR | Status: DC | PRN
  Administered 2023-04-10: 6 mg via INTRAVENOUS

## 2023-04-10 MED ORDER — EPHEDRINE SULFATE (PRESSORS) 50 MG/ML IJ SOLN
INTRAMUSCULAR | Status: DC | PRN
  Administered 2023-04-10 (×2): 10 mg via INTRAVENOUS

## 2023-04-10 MED ORDER — CHLORHEXIDINE GLUCONATE 0.12 % MT SOLN
OROMUCOSAL | Status: AC
  Filled 2023-04-10: qty 15

## 2023-04-10 MED ORDER — FENTANYL CITRATE (PF) 100 MCG/2ML IJ SOLN
INTRAMUSCULAR | Status: AC
  Filled 2023-04-10: qty 2

## 2023-04-10 MED ORDER — PROPOFOL 10 MG/ML IV BOLUS
INTRAVENOUS | Status: DC | PRN
  Administered 2023-04-10: 150 mg via INTRAVENOUS

## 2023-04-10 MED ORDER — SUGAMMADEX SODIUM 200 MG/2ML IV SOLN
INTRAVENOUS | Status: DC | PRN
  Administered 2023-04-10: 200 mg via INTRAVENOUS

## 2023-04-10 MED ORDER — ONDANSETRON HCL 4 MG/2ML IJ SOLN
INTRAMUSCULAR | Status: AC
  Filled 2023-04-10: qty 2

## 2023-04-10 MED ORDER — FENTANYL CITRATE (PF) 100 MCG/2ML IJ SOLN: 25.0000 ug | INTRAMUSCULAR | Status: DC | PRN

## 2023-04-10 MED ORDER — ACETAMINOPHEN 10 MG/ML IV SOLN: 1000.0000 mg | Freq: Once | INTRAVENOUS | Status: DC | PRN

## 2023-04-10 MED ORDER — IOHEXOL 180 MG/ML  SOLN
INTRAMUSCULAR | Status: DC | PRN
  Administered 2023-04-10: 20 mL

## 2023-04-10 SURGICAL SUPPLY — 37 items
BAG DRAIN SIEMENS DORNER NS (MISCELLANEOUS) ×1 IMPLANT
BAG DRN NS LF (MISCELLANEOUS) ×1
BAG PRESSURE INF REUSE 3000 (BAG) ×1 IMPLANT
BRUSH SCRUB EZ  4% CHG (MISCELLANEOUS)
BRUSH SCRUB EZ 1% IODOPHOR (MISCELLANEOUS) ×1 IMPLANT
BRUSH SCRUB EZ 4% CHG (MISCELLANEOUS) ×1 IMPLANT
CATH URET FLEX-TIP 2 LUMEN 10F (CATHETERS) ×1 IMPLANT
CATH URETL OPEN 5X70 (CATHETERS) ×1 IMPLANT
CNTNR URN SCR LID CUP LEK RST (MISCELLANEOUS) ×1 IMPLANT
CONT SPEC 4OZ STRL OR WHT (MISCELLANEOUS) ×1
DRAPE UTILITY 15X26 TOWEL STRL (DRAPES) ×1 IMPLANT
DRSG TELFA 3X4 N-ADH STERILE (GAUZE/BANDAGES/DRESSINGS) ×1 IMPLANT
FIBER LASER MOSES 200 DFL (Laser) IMPLANT
FORCEPS BIOP PIRANHA Y (CUTTING FORCEPS) IMPLANT
GAUZE 4X4 16PLY ~~LOC~~+RFID DBL (SPONGE) ×2 IMPLANT
GLOVE BIO SURGEON STRL SZ 6.5 (GLOVE) ×1 IMPLANT
GOWN STRL REUS W/ TWL LRG LVL3 (GOWN DISPOSABLE) ×2 IMPLANT
GOWN STRL REUS W/TWL LRG LVL3 (GOWN DISPOSABLE) ×2
GUIDEWIRE GREEN .038 145CM (MISCELLANEOUS) ×1 IMPLANT
GUIDEWIRE STR DUAL SENSOR (WIRE) ×2 IMPLANT
IV NS IRRIG 3000ML ARTHROMATIC (IV SOLUTION) ×1 IMPLANT
KIT TURNOVER CYSTO (KITS) ×1 IMPLANT
MANIFOLD NEPTUNE II (INSTRUMENTS) ×1 IMPLANT
NDL SAFETY ECLIP 18X1.5 (MISCELLANEOUS) ×1 IMPLANT
PACK CYSTO AR (MISCELLANEOUS) ×1 IMPLANT
SET CYSTO W/LG BORE CLAMP LF (SET/KITS/TRAYS/PACK) ×1 IMPLANT
SHEATH NAVIGATOR HD 12/14X36 (SHEATH) ×1 IMPLANT
SHEATH NAVIGATOR HD 12/14X46 (SHEATH) IMPLANT
STENT URET 6FRX24 CONTOUR (STENTS) IMPLANT
STENT URET 6FRX26 CONTOUR (STENTS) IMPLANT
SURGILUBE 2OZ TUBE FLIPTOP (MISCELLANEOUS) ×1 IMPLANT
SYR TOOMEY IRRIG 70ML (MISCELLANEOUS)
SYRINGE TOOMEY IRRIG 70ML (MISCELLANEOUS) ×1 IMPLANT
TRAP FLUID SMOKE EVACUATOR (MISCELLANEOUS) ×1 IMPLANT
WATER STERILE IRR 1000ML POUR (IV SOLUTION) ×1 IMPLANT
WATER STERILE IRR 3000ML UROMA (IV SOLUTION) ×1 IMPLANT
WATER STERILE IRR 500ML POUR (IV SOLUTION) ×1 IMPLANT

## 2023-04-10 NOTE — Discharge Instructions (Addendum)
You have a ureteral stent in place.  This is a tube that extends from your kidney to your bladder.  This may cause urinary bleeding, burning with urination, and urinary frequency.  Please call our office or present to the ED if you develop fevers >101 or pain which is not able to be controlled with oral pain medications.  You may be given either Flomax and/ or ditropan to help with bladder spasms and stent pain in addition to pain medications.    We did confirm the presence of a tumor in your left kidney.  Additional biopsies were taken today.  Will discuss management of your stone and of the malignancy when you return to the office once we have the pathology results.  [facility] [address] [place], [place] [address] [phone]      AMBULATORY SURGERY  DISCHARGE INSTRUCTIONS   The drugs that you were given will stay in your system until tomorrow so for the next 24 hours you should not:  Drive an automobile Make any legal decisions Drink any alcoholic beverage   You may resume regular meals tomorrow.  Today it is better to start with liquids and gradually work up to solid foods.  You may eat anything you prefer, but it is better to start with liquids, then soup and crackers, and gradually work up to solid foods.   Please notify your doctor immediately if you have any unusual bleeding, trouble breathing, redness and pain at the surgery site, drainage, fever, or pain not relieved by medication.    Additional Instructions:        Please contact your physician with any problems or Same Day Surgery at [phone], Monday through Friday 6 am to 4 pm, or [place] at [facility] number at [phone].

## 2023-04-10 NOTE — Op Note (Signed)
Date of procedure: 04/10/23  Preoperative diagnosis:  Gross hematuria History of bladder cancer Possible left upper tract malignancy versus clot  Postoperative diagnosis:  Same as above Left upper tract urothelial carcinoma  Procedure: Left diagnostic ureteroscopy Retrograde pyelogram Left upper tract urothelial biopsies Laser ablation of left upper tract urothelial tumor Left ureteral stent replacement Interpretation of fluoroscopy less than 30 minutes  Surgeon: [name], MD  Anesthesia: General  Complications: None  Intraoperative findings: Visually confirmed the presence of nodular tumor occupying compound calyx of left lower pole.  Pictures in media.  Multiple biopsies taken.  Attempted fulguration of tumor using ablative laser settings of, difficult given gold and incomplete resection.  Stent replaced.  EBL: Minimal  Specimens: Left renal biopsies (urothelial)  Drains: 6 x 24 French double-J ureteral stent on left  Indication: Trevor [name] is a 77 y.o. patient with male with a personal history of recurrent bladder cancer found to have gross hematuria.  He had last procedure, he was noted to have an abnormal left retrograde pyelogram.  Diagnostic ureteroscopy confirmed either clot versus tumor within the left kidney but visualization was suboptimal and biopsy was suspicious but not confirmatory.  He returns today for staged procedure..  After reviewing the management options for treatment, he elected to proceed with the above surgical procedure(s). We have discussed the potential benefits and risks of the procedure, side effects of the proposed treatment, the likelihood of the patient achieving the goals of the procedure, and any potential problems that might occur during the procedure or recuperation. Informed consent has been obtained.  Description of procedure:  The patient was taken to the operating room and general anesthesia was induced.  The patient was placed in  the dorsal lithotomy position, prepped and draped in the usual sterile fashion, and preoperative antibiotics were administered. A preoperative time-out was performed.   21 French cystoscope was advanced through hypospadias urethra.  There was mild bulbar stricture which was easily traversable.  Attention was turned to the left side where a stent was seen emanating.  Graspers were used to bring it to the level of the meatus.  A sensor wire was then used to cannulate the the stent leaving the wire in place removing the stent.  A dual-lumen access sheath was used to introduce a Super Stiff wire up to the level of the kidney.  This was used as a working wire.  A 45 cm access sheath was then used over the Super Stiff wire to the proximal ureter.  This was advanced fluoroscopically.  It went easily.  The inner lumen and wire were then removed.  An 8 [place] dual digital flexible ureteroscope was then advanced into the kidney.  Visualization was dramatically improved.  There was some fibrinous debris presumably from the stent.  The upper and mid poles were completely normal but upon advancing the scope into a compound lower pole calyx, the 2 adjoining calyces were noted to be completely full of tumor which was somewhat nodular and hypervascular.  I then used Piranha forceps to take multiple biopsies which were of fairly significant size which were passed off the field.  I then brought in a 200 m laser fiber and using laser ablation settings of 1 J and 15 Hz, I did my best to ablate the tumor.  That being said, due to the angulation in the extreme lower pole, there was clearly a portion of tumor which was not able to be treated.  Ablation was subtotal, at least  50% of the greater than 1 cm worth of tumor was ablated.  Finally after hemostasis was reasonably adequate, I backed the scope down the length of the ureter inspecting along the way.  There is no ureteral injuries or tumors appreciated.  Finally, the safety wire  was backloaded over rigid cystoscope and a 6 x 24 French double-J ureteral stent was replaced.  The bladder was inspected and noted to be free of any tumors masses or lesions.  The patient was then cleaned and dried, repositioned in supine position, reversed of anesthesia, and taken to the PACU in stable condition.  Plan: Will have him return to the office next week to discuss stent management as well as his upper tract pathology.  [name], M.D.

## 2023-04-10 NOTE — Anesthesia Preprocedure Evaluation (Addendum)
Anesthesia Evaluation  Patient identified by MRN, date of birth, ID band Patient awake    Reviewed: Allergy & Precautions, H&P , NPO status , Patient's Chart, lab work & pertinent test results  History of Anesthesia Complications Negative for: history of anesthetic complications  Airway Mallampati: III  TM Distance: <3 FB Neck ROM: limited    Dental  (+) Dental Advidsory Given   Pulmonary neg shortness of breath, sleep apnea , neg recent URI   Pulmonary exam normal        Cardiovascular Exercise Tolerance: Good hypertension, Pt. on medications (-) angina + CAD  (-) Past MI and (-) DOE + dysrhythmias (h/o NSVT in setting of urosepsis 10/23) + Valvular Problems/Murmurs MR and AS  Rhythm:Regular Rate:Normal  10/12/2022 1. Normal left ventricular systolic function with an EF of >55%  2. Mild concentric LVH 3. No regional wall motion abnormalities 4. Left ventricular diastolic Doppler parameters consistent with abnormal relaxation (G1DD). 5. Mild right ventricular enlargement mild biatrial enlargement 6. Trivial AR and PR 7. Mild MR and TR 8. Trivial aortic stenosis with a mean pressure gradient of 8.2 mmHg; AVA (VTI) = 1.3 cm 9. No pericardial effusion    Neuro/Psych neg Seizures PSYCHIATRIC DISORDERS Anxiety Depression    Cervical DDD  Neuromuscular disease (chronic back pain s/p fusions)    GI/Hepatic ,GERD  Medicated and Controlled,,(+) Cirrhosis  (seen on CT scan)      Splenomegaly   Endo/Other  negative endocrine ROS    Renal/GU Renal disease   Left Urothelial Mass, History of Bladder Cancer    Musculoskeletal  (+) Arthritis ,    Abdominal Normal abdominal exam  (+)   Peds  Hematology  (+) Blood dyscrasia, anemia anemia, thrombocytopenia   Anesthesia Other Findings Past Medical History: No date: Acne No date: Allergic rhinitis No date: Anemia No date: Anxiety No date: BPH (benign prostatic  hypertrophy) with urinary obstruction No date: Cancer [facility])     Comment:  bladder CA; remission since Oct 2014 No date: Carpal tunnel syndrome No date: Cervicalgia No date: Degenerative disc disease, cervical     Comment:  neck and back;  No date: Depression     Comment:  controlled;  No date: Gastritis No date: GERD (gastroesophageal reflux disease) No date: Hemorrhoids No date: History of malignant neoplasm of bladder No date: Hyperlipidemia No date: Hypertension     Comment:  controlled with medication;  No date: Hypospadias No date: Low back pain No date: Mitral regurgitation No date: Pre-diabetes No date: Sleep apnea No date: Urinary frequency  Past Surgical History: 09/11/2019: ANTERIOR LAT LUMBAR FUSION; N/A     Comment:  Procedure: PRONE XLIF L2-3;  Surgeon: Venetia Night, MD;  Location: [facility] ORS;  Service: Neurosurgery;              Laterality: N/A; No date: BLADDER SURGERY     Comment:  2011, 2013, 2015 07/31/2015: COLONOSCOPY WITH PROPOFOL; N/A     Comment:  Procedure: COLONOSCOPY WITH PROPOFOL;  Surgeon: Scot Jun, MD;  Location: [facility] ENDOSCOPY;  Service:               Endoscopy;  Laterality: N/A; No date: COLONOSCOPY, ESOPHAGOGASTRODUODENOSCOPY (EGD) AND ESOPHAGEAL  DILATION No date: FLEXIBLE SIGMOIDOSCOPY 06/20/2018: LUMBAR LAMINECTOMY/DECOMPRESSION MICRODISCECTOMY; N/A     Comment:  Procedure: LUMBAR LAMINECTOMY/DECOMPRESSION  MICRODISCECTOMY 1 LEVEL-L-2-3;  Surgeon: Venetia Night, MD;  Location: [facility] ORS;  Service: Neurosurgery;              Laterality: N/A; 09/11/2019: POSTERIOR LUMBAR FUSION 4 WITH HARDWARE REMOVAL; N/A     Comment:  Procedure: L2-S1 POSTERIOR FUSION, ABORTED;  Surgeon:               Venetia Night, MD;  Location: [facility] ORS;  Service:               Neurosurgery;  Laterality: N/A; No date: SEPTOPLASTY No date: TONSILLECTOMY  BMI    Body Mass Index: 28.54 kg/m       Reproductive/Obstetrics negative OB ROS                             Anesthesia Physical Anesthesia Plan  ASA: 3  Anesthesia Plan: General   Post-op Pain Management: Minimal or no pain anticipated   Induction: Intravenous  PONV Risk Score and Plan: 2 and Ondansetron, Dexamethasone and Treatment may vary due to age or medical condition  Airway Management Planned: Oral ETT  Additional Equipment:   Intra-op Plan:   Post-operative Plan: Extubation in OR  Informed Consent: I have reviewed the patients History and Physical, chart, labs and discussed the procedure including the risks, benefits and alternatives for the proposed anesthesia with the patient or authorized representative who has indicated his/her understanding and acceptance.     Dental Advisory Given  Plan Discussed with: Anesthesiologist, CRNA and Surgeon  Anesthesia Plan Comments: (Patient consented for risks of anesthesia including but not limited to:  - adverse reactions to medications - damage to teeth, lips or other oral mucosa - sore throat or hoarseness - Damage to heart, brain, lungs or loss of life  Patient voiced understanding.)        Anesthesia Quick Evaluation

## 2023-04-10 NOTE — Anesthesia Procedure Notes (Signed)
Procedure Name: Intubation Date/Time: 04/10/2023 10:15 AM  Performed by: Jeannene Patella, CRNAPre-anesthesia Checklist: Patient identified, Emergency Drugs available, Suction available, Patient being monitored and Timeout performed Patient Re-evaluated:Patient Re-evaluated prior to induction Oxygen Delivery Method: Circle system utilized Preoxygenation: Pre-oxygenation with 100% oxygen Induction Type: IV induction Ventilation: Mask ventilation without difficulty Laryngoscope Size: McGraph and 4 Grade View: Grade I Tube type: Oral Tube size: 7.5 mm Number of attempts: 1 Airway Equipment and Method: Stylet and Video-laryngoscopy Placement Confirmation: ETT inserted through vocal cords under direct vision, positive ETCO2 and breath sounds checked- equal and bilateral Secured at: 21 cm Tube secured with: Tape Dental Injury: Teeth and Oropharynx as per pre-operative assessment

## 2023-04-10 NOTE — Interval H&P Note (Signed)
History and Physical Interval Note:  04/10/2023 9:45 AM  Trevor [name]  has presented today for surgery, with the diagnosis of Left Urothelial Mass, History of Bladder Cancer.  The various methods of treatment have been discussed with the patient and family. After consideration of risks, benefits and other options for treatment, the patient has consented to  Procedure(s): DIAGNOSTIC URETEROSCOPY (Left) URETERAL BIOPSY WITH POSSIBLE ABLATION OF TUMOR (Left) CYSTOSCOPY WITH RETROGRADE PYELOGRAM (Left) CYSTOSCOPY WITH STENT EXCHANGE (Left) as a surgical intervention.  The patient's history has been reviewed, patient examined, no change in status, stable for surgery.  I have reviewed the patient's chart and labs.  Questions were answered to the patient's satisfaction.  RRR CTAB     [name]

## 2023-04-10 NOTE — Transfer of Care (Signed)
Immediate Anesthesia Transfer of Care Note  Patient: Trevor [name]  Procedure(s) Performed: DIAGNOSTIC URETEROSCOPY (Left) URETERAL BIOPSY WITH POSSIBLE ABLATION OF TUMOR (Left) CYSTOSCOPY WITH RETROGRADE PYELOGRAM (Left) CYSTOSCOPY WITH STENT EXCHANGE (Left)  Patient Location: PACU  Anesthesia Type:General  Level of Consciousness: awake, drowsy, and patient cooperative  Airway & Oxygen Therapy: Patient Spontanous Breathing and Patient connected to face mask oxygen  Post-op Assessment: Report given to RN and Post -op Vital signs reviewed and stable  Post vital signs: Reviewed and stable  Last Vitals:  Vitals Value Taken Time  BP 152/71 04/10/23 1115  Temp    Pulse 73 04/10/23 1119  Resp 0 04/10/23 1119  SpO2 100 % 04/10/23 1119  Vitals shown include unvalidated device data.  Last Pain:  Vitals:   04/10/23 0835  TempSrc: Temporal  PainSc: 0-No pain         Complications: No notable events documented.

## 2023-04-11 ENCOUNTER — Encounter: Payer: Self-pay | Admitting: Urology

## 2023-04-11 LAB — SURGICAL PATHOLOGY

## 2023-04-11 NOTE — Anesthesia Postprocedure Evaluation (Signed)
Anesthesia Post Note  Patient: Trevor [name]  Procedure(s) Performed: DIAGNOSTIC URETEROSCOPY (Left) URETERAL BIOPSY WITH POSSIBLE ABLATION OF TUMOR (Left) CYSTOSCOPY WITH RETROGRADE PYELOGRAM (Left) CYSTOSCOPY WITH STENT EXCHANGE (Left)  Patient location during evaluation: PACU Anesthesia Type: General Level of consciousness: awake and alert Pain management: pain level controlled Vital Signs Assessment: post-procedure vital signs reviewed and stable Respiratory status: spontaneous breathing, nonlabored ventilation and respiratory function stable Cardiovascular status: blood pressure returned to baseline and stable Postop Assessment: no apparent nausea or vomiting Anesthetic complications: no   No notable events documented.   Last Vitals:  Vitals:   04/10/23 1115 04/10/23 1214  BP: (!) 152/71 137/87  Pulse: 81 76  Resp: (!) 22 18  Temp: (!) 36.4 C (!) 36.2 C  SpO2: 100% 99%    Last Pain:  Vitals:   04/10/23 1214  TempSrc: Temporal  PainSc: 0-No pain                 Foye Deer

## 2023-04-19 ENCOUNTER — Ambulatory Visit (INDEPENDENT_AMBULATORY_CARE_PROVIDER_SITE_OTHER): Payer: Medicare Other | Admitting: Urology

## 2023-04-19 VITALS — BP 142/77 | HR 77 | Ht 67.0 in | Wt 174.0 lb

## 2023-04-19 DIAGNOSIS — C642 Malignant neoplasm of left kidney, except renal pelvis: Secondary | ICD-10-CM | POA: Diagnosis not present

## 2023-04-19 NOTE — H&P (View-Only) (Signed)
Trevor [name],acting as a scribe for Trevor Scotland, MD.,have documented all relevant documentation on the behalf of Trevor Scotland, MD,as directed by  Trevor Scotland, MD while in the presence of Trevor Scotland, MD.  04/19/2023 4:51 PM   Trevor [name] Sep 20, 1946 [id number]  Referring provider: Kandyce Rud, MD [phone] 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  He was found to have an abnormal filling defect at the time of his most recent procedure. He recently underwent a second look left ureteroscopy for probable tumor surgical pathology on 04/10/23. This time, it is consistent with high-grade urothelial carcinoma. He still has a stent in place. He presents today for treatment management options.   Over the past week, [name] experienced significant hematuria, leading to weakness and fatigue, though no fevers were reported. The bleeding has since subsided.   PMH: Past Medical History:  Diagnosis Date   Acne    AKI (acute kidney injury) (HCC)    Allergic rhinitis    Anemia    Anxiety    Aortic stenosis    a.) TTE 10/12/2022: mild AS (MPG 8.2); AVA (VTI) = 1.3 cm2   Bladder cancer (HCC)    a.) remission since Oct 2014   BPH (benign prostatic hypertrophy) with urinary obstruction    CAD (coronary artery disease)    Carpal tunnel syndrome    Cervicalgia    Chronic low back pain    Cirrhosis (HCC)    Degenerative disc disease, cervical    Depression    Gastritis    GERD (gastroesophageal reflux disease)    Heart murmur    Hemorrhoids    History of malignant neoplasm of bladder    Hyperlipidemia    Hypertension    Hypokalemia    Hypospadias    Lumbar spondylosis    a.) s/p L2-4 PLIF with L4-5 and L5-S1  interbody fusion   Non-sustained ventricular tachycardia (HCC) 09/2022   a.) 8 beat episode of NSVT during admission in setting urosepsis   Pre-diabetes    Sepsis (HCC)    Sleep apnea    a.) does not utilize nocturnal PAP therapy   Sleep difficulties    a.) on trazodone PRN   Splenomegaly    Thrombocytopenia (HCC)    Urinary frequency     Surgical History: Past Surgical History:  Procedure Laterality Date   ADENOIDECTOMY     ANTERIOR FUSION LUMBAR SPINE  11/20/2017   multiple levels; Dr. Myer Haff   ANTERIOR LAT LUMBAR FUSION N/A 09/11/2019   Procedure: PRONE XLIF L2-3;  Surgeon: Venetia Night, MD;  Location: [facility] ORS;  Service: Neurosurgery;  Laterality: N/A;   BIOPSY Left 03/13/2023   Procedure: BIOPSY;  Surgeon: Trevor Scotland, MD;  Location: [facility] ORS;  Service: Urology;  Laterality: Left;   BLADDER SURGERY     2011, 2013, 2015   COLONOSCOPY WITH PROPOFOL N/A 07/31/2015   Procedure: COLONOSCOPY WITH PROPOFOL;  Surgeon: Scot Jun, MD;  Location: [facility] ENDOSCOPY;  Service: Endoscopy;  Laterality: N/A;   COLONOSCOPY, ESOPHAGOGASTRODUODENOSCOPY (EGD) AND ESOPHAGEAL DILATION  2006   CYSTOSCOPY W/ RETROGRADES Bilateral 11/11/2021   Procedure: CYSTOSCOPY WITH RETROGRADE PYELOGRAM;  Surgeon: Trevor Scotland, MD;  Location: [facility] ORS;  Service: Urology;  Laterality: Bilateral;  CYSTOSCOPY W/ RETROGRADES Bilateral 03/13/2023   Procedure: CYSTOSCOPY WITH RETROGRADE PYELOGRAM;  Surgeon: Trevor Scotland, MD;  Location: [facility];  Service: Urology;  Laterality: Bilateral;   CYSTOSCOPY W/ RETROGRADES Left 04/10/2023   Procedure: CYSTOSCOPY WITH RETROGRADE PYELOGRAM;  Surgeon: Trevor Scotland, MD;  Location: [facility] OR[facility];  Service: Urology;  Laterality: Left;   CYSTOSCOPY W/ URETERAL STENT PLACEMENT Left 04/10/2023   Procedure: CYSTOSCOPY WITH STENT EXCHANGE;  Surgeon: Trevor Scotland, MD;  Location: [facility];  Service: Urology;  Laterality: Left;   CYSTOSCOPY WITH BIOPSY N/A 11/11/2021    Procedure: CYSTOSCOPY WITH BLADDER BIOPSY;  Surgeon: Trevor Scotland, MD;  Location: [facility] ORS;  Service: Urology;  Laterality: N/A;   CYSTOSCOPY WITH BIOPSY N/A 03/13/2023   Procedure: CYSTOSCOPY WITH BLADDER BIOPSY;  Surgeon: Trevor Scotland, MD;  Location: [facility];  Service: Urology;  Laterality: N/A;   FLEXIBLE SIGMOIDOSCOPY  1994   LUMBAR LAMINECTOMY/DECOMPRESSION MICRODISCECTOMY N/A 06/20/2018   Procedure: LUMBAR LAMINECTOMY/DECOMPRESSION MICRODISCECTOMY 1 LEVEL-L-2-3;  Surgeon: Venetia Night, MD;  Location: [facility] OR[facility];  Service: Neurosurgery;  Laterality: N/A;   POSTERIOR FUSION LUMBAR SPINE  10/02/2019   L2-4   POSTERIOR LUMBAR FUSION 4 WITH HARDWARE REMOVAL N/A 09/11/2019   Procedure: L2-S1 POSTERIOR FUSION, ABORTED;  Surgeon: Venetia Night, MD;  Location: [facility];  Service: Neurosurgery;  Laterality: N/A;   SEPTOPLASTY  1996   TONSILLECTOMY     URETERAL BIOPSY Left 04/10/2023   Procedure: URETERAL BIOPSY WITH POSSIBLE ABLATION OF TUMOR;  Surgeon: Trevor Scotland, MD;  Location: [facility];  Service: Urology;  Laterality: Left;   URETEROSCOPY Left 03/13/2023   Procedure: URETEROSCOPY;  Surgeon: Trevor Scotland, MD;  Location: [facility];  Service: Urology;  Laterality: Left;   URETEROSCOPY Left 04/10/2023   Procedure: DIAGNOSTIC URETEROSCOPY;  Surgeon: Trevor Scotland, MD;  Location: [facility];  Service: Urology;  Laterality: Left;    Home Medications:  Allergies as of 04/19/2023       Reactions   Ceftriaxone Hives   Tolerated 1st generation cephalosporin (CEFAZOLIN) on 06/20/2018 and 09/11/2019 without documented ADRs. Previously tolerated amoxicillin Blisters and Hives around mouth and neck.  Some blistering of lips.  Mouth not effected.  Patient started on ceftriaxone and doxycyline 1-2 days prior to reaction.     Doxycycline Hives   Blisters and Hives around mouth and neck.  Some blistering of lips.  Mouth not effected. Patient started on ceftriaxone and doxycyline 1-2 days prior  to reaction.     Macrobid [nitrofurantoin] Rash        Medication List        Accurate as of Apr 19, 2023  4:51 PM. If you have any questions, ask your nurse or doctor.          STOP taking these medications    ciprofloxacin 500 MG tablet Commonly known as: CIPRO Stopped by: Trevor Scotland, MD   oxybutynin 5 MG tablet Commonly known as: DITROPAN Stopped by: Trevor Scotland, MD       TAKE these medications    aspirin EC 81 MG tablet Take 81 mg by mouth daily.   carvedilol 3.125 MG tablet Commonly known as: COREG Take 1 tablet (3.125 mg total) by mouth 2 (two) times daily with a meal.   cyanocobalamin 500 MCG tablet Commonly known as: VITAMIN B12 Take 500 mcg by mouth daily.   finasteride 5 MG tablet Commonly known as: PROSCAR Take 1 tablet (5 mg total) by mouth daily. What changed: when to take this   fluticasone 50 MCG/ACT nasal  spray Commonly known as: FLONASE Place 2 sprays into both nostrils daily as needed for allergies.   HYDROcodone-acetaminophen 5-325 MG tablet Commonly known as: NORCO/VICODIN Take 1-2 tablets by mouth every 6 (six) hours as needed for moderate pain.   IRON PO Take 1 tablet by mouth daily.   levothyroxine 25 MCG tablet Commonly known as: SYNTHROID Take 25 mcg by mouth daily before breakfast.   lisinopril-hydrochlorothiazide 20-25 MG tablet Commonly known as: ZESTORETIC Take 0.5 tablets by mouth every morning.   multivitamin with minerals Tabs tablet Take 1 tablet by mouth daily.   Omeprazole 20 MG Tbec Take 20 mg by mouth every morning.   OVER THE COUNTER MEDICATION Take 2 tablets by mouth daily. Arthritis/Joint supplement, Omega XL   senna-docusate 8.6-50 MG tablet Commonly known as: Senokot-S Take 2 tablets by mouth at bedtime as needed for mild constipation.   simvastatin 40 MG tablet Commonly known as: ZOCOR Take 40 mg by mouth every morning.   tamsulosin 0.4 MG Caps capsule Commonly known as: FLOMAX Take 1  capsule (0.4 mg total) by mouth daily. What changed: when to take this   traZODone 100 MG tablet Commonly known as: DESYREL Take 100 mg by mouth at bedtime.   VITAMIN D PO Take 1 tablet by mouth daily at 2 PM.        Allergies:  Allergies  Allergen Reactions   Ceftriaxone Hives    Tolerated 1st generation cephalosporin (CEFAZOLIN) on 06/20/2018 and 09/11/2019 without documented ADRs. Previously tolerated amoxicillin  Blisters and Hives around mouth and neck.  Some blistering of lips.  Mouth not effected.  Patient started on ceftriaxone and doxycyline 1-2 days prior to reaction.     Doxycycline Hives    Blisters and Hives around mouth and neck.  Some blistering of lips.  Mouth not effected. Patient started on ceftriaxone and doxycyline 1-2 days prior to reaction.     Macrobid [Nitrofurantoin] Rash    Family History: Family History  Problem Relation Age of Onset   Hyperlipidemia Brother    Hyperlipidemia Sister    Heart attack Father     Social History:  reports that he has never smoked. He has never been exposed to tobacco smoke. He has never used smokeless tobacco. He reports that he does not drink alcohol and does not use drugs.   Physical Exam: BP (!) 142/77   Pulse 77   Ht 5\' 7"  (1.702 m)   Wt 174 lb (78.9 kg)   BMI 27.25 kg/m   Constitutional:  Alert and oriented, No acute distress. HEENT: [place] AT, moist mucus membranes.  Trachea midline, no masses. Neurologic: Grossly intact, no focal deficits, moving all 4 extremities. Psychiatric: Normal mood and affect.   Assessment & Plan:    1. Left upper tract urothelial carcinoma, high grade -We had a lengthy discussion today about the natural history of upper tract urothelial carcinoma and treatment options for high-grade disease.  Tumor size appears significant and is likely not amenable to local therapy, please see previous note for details. - Preoperative UA today - Stent removal scheduled for tomorrow. - CT of  the chest, abdomen, and pelvis for complete staging to ensure the cancer has not metastasized once the stent is removed - Discussion on the risks and benefits of neoadjuvant chemotherapy (gemcitabine and cisplatin) versus proceeding directly to nephroureterectomy. Given the his age and health concerns, including a history of kidney function issues, neoadjuvant chemotherapy would be very difficult to tolerate - He will consider discussing  neoadjuvant chemotherapy with an oncologist but is currently leaning against it; he will let us know if he would like medical oncology consult to discuss this further -Discussed robotic nephroureterectomy. Surgical approach will be minimally invasive, utilizing robotic assistance to reduce recovery time and complications. A Foley catheter will be placed for 7 days after the surgery to allow the bladder to heal. We discussed the management of potential complications, including bleeding, infection, and injury to surrounding structures during surgery, hernia formation and more serious complications including heart attack stroke death etc. - Postoperative care will include monitoring for CKD progression and managing a solitary kidney, with emphasis on avoiding certain medications and dehydration. - We discussed  potential need for adjuvant chemotherapy if postoperative staging reveals more advanced disease which he may or may not be able to tolerate pending his renal function - He is advised to take a minimum of two to three weeks off work post-surgery to ensure proper recovery prefer he take at least 6 weeks if possible, lifting restrictions reviewed.  I have reviewed the above documentation for accuracy and completeness, and I agree with the above.   Trevor Scotland, MD   I spent 55 total minutes on the day of the encounter including pre-visit review of the medical record, face-to-face time with the patient, and post visit ordering of labs/imaging/tests.   [facility]  [facility] [address] [place], [place] [address] [phone]

## 2023-04-19 NOTE — Progress Notes (Signed)
Trevor [name],acting as a scribe for Trevor Scotland, MD.,have documented all relevant documentation on the behalf of Trevor Scotland, MD,as directed by  Trevor Scotland, MD while in the presence of Trevor Scotland, MD.  04/19/2023 4:51 PM   Trevor [name] [birth year] [id number]  Referring provider: Kandyce Rud, MD [phone] 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  He was found to have an abnormal filling defect at the time of his most recent procedure. He recently underwent a second look left ureteroscopy for probable tumor surgical pathology on 04/10/23. This time, it is consistent with high-grade urothelial carcinoma. He still has a stent in place. He presents today for treatment management options.   Over the past week, [name] experienced significant hematuria, leading to weakness and fatigue, though no fevers were reported. The bleeding has since subsided.   PMH: Past Medical History:  Diagnosis Date   Acne    AKI (acute kidney injury) (HCC)    Allergic rhinitis    Anemia    Anxiety    Aortic stenosis    a.) TTE 10/12/2022: mild AS (MPG 8.2); AVA (VTI) = 1.3 cm2   Bladder cancer (HCC)    a.) remission since Oct 2014   BPH (benign prostatic hypertrophy) with urinary obstruction    CAD (coronary artery disease)    Carpal tunnel syndrome    Cervicalgia    Chronic low back pain    Cirrhosis (HCC)    Degenerative disc disease, cervical    Depression    Gastritis    GERD (gastroesophageal reflux disease)    Heart murmur    Hemorrhoids    History of malignant neoplasm of bladder    Hyperlipidemia    Hypertension    Hypokalemia    Hypospadias    Lumbar spondylosis    a.) s/p L2-4 PLIF with L4-5 and L5-S1  interbody fusion   Non-sustained ventricular tachycardia (HCC) 09/2022   a.) 8 beat episode of NSVT during admission in setting urosepsis   Pre-diabetes    Sepsis (HCC)    Sleep apnea    a.) does not utilize nocturnal PAP therapy   Sleep difficulties    a.) on trazodone PRN   Splenomegaly    Thrombocytopenia (HCC)    Urinary frequency     Surgical History: Past Surgical History:  Procedure Laterality Date   ADENOIDECTOMY     ANTERIOR FUSION LUMBAR SPINE  11/20/2017   multiple levels; Dr. Myer Haff   ANTERIOR LAT LUMBAR FUSION N/A 09/11/2019   Procedure: PRONE XLIF L2-3;  Surgeon: Venetia Night, MD;  Location: [facility];  Service: Neurosurgery;  Laterality: N/A;   BIOPSY Left 03/13/2023   Procedure: BIOPSY;  Surgeon: Trevor Scotland, MD;  Location: [facility];  Service: Urology;  Laterality: Left;   BLADDER SURGERY     2011, 2013, 2015   COLONOSCOPY WITH PROPOFOL N/A 07/31/2015   Procedure: COLONOSCOPY WITH PROPOFOL;  Surgeon: Scot Jun, MD;  Location: [facility];  Service: Endoscopy;  Laterality: N/A;   COLONOSCOPY, ESOPHAGOGASTRODUODENOSCOPY (EGD) AND ESOPHAGEAL DILATION  2006   CYSTOSCOPY W/ RETROGRADES Bilateral 11/11/2021   Procedure: CYSTOSCOPY WITH RETROGRADE PYELOGRAM;  Surgeon: Trevor Scotland, MD;  Location: [facility];  Service: Urology;  Laterality: Bilateral;  CYSTOSCOPY W/ RETROGRADES Bilateral 03/13/2023   Procedure: CYSTOSCOPY WITH RETROGRADE PYELOGRAM;  Surgeon: Trevor Scotland, MD;  Location: [facility];  Service: Urology;  Laterality: Bilateral;   CYSTOSCOPY W/ RETROGRADES Left 04/10/2023   Procedure: CYSTOSCOPY WITH RETROGRADE PYELOGRAM;  Surgeon: Trevor Scotland, MD;  Location: [facility];  Service: Urology;  Laterality: Left;   CYSTOSCOPY W/ URETERAL STENT PLACEMENT Left 04/10/2023   Procedure: CYSTOSCOPY WITH STENT EXCHANGE;  Surgeon: Trevor Scotland, MD;  Location: [facility];  Service: Urology;  Laterality: Left;   CYSTOSCOPY WITH BIOPSY N/A 11/11/2021    Procedure: CYSTOSCOPY WITH BLADDER BIOPSY;  Surgeon: Trevor Scotland, MD;  Location: [facility];  Service: Urology;  Laterality: N/A;   CYSTOSCOPY WITH BIOPSY N/A 03/13/2023   Procedure: CYSTOSCOPY WITH BLADDER BIOPSY;  Surgeon: Trevor Scotland, MD;  Location: [facility];  Service: Urology;  Laterality: N/A;   FLEXIBLE SIGMOIDOSCOPY  1994   LUMBAR LAMINECTOMY/DECOMPRESSION MICRODISCECTOMY N/A 06/20/2018   Procedure: LUMBAR LAMINECTOMY/DECOMPRESSION MICRODISCECTOMY 1 LEVEL-L-2-3;  Surgeon: Venetia Night, MD;  Location: [facility];  Service: Neurosurgery;  Laterality: N/A;   POSTERIOR FUSION LUMBAR SPINE  10/02/2019   L2-4   POSTERIOR LUMBAR FUSION 4 WITH HARDWARE REMOVAL N/A 09/11/2019   Procedure: L2-S1 POSTERIOR FUSION, ABORTED;  Surgeon: Venetia Night, MD;  Location: [facility];  Service: Neurosurgery;  Laterality: N/A;   SEPTOPLASTY  1996   TONSILLECTOMY     URETERAL BIOPSY Left 04/10/2023   Procedure: URETERAL BIOPSY WITH POSSIBLE ABLATION OF TUMOR;  Surgeon: Trevor Scotland, MD;  Location: [facility];  Service: Urology;  Laterality: Left;   URETEROSCOPY Left 03/13/2023   Procedure: URETEROSCOPY;  Surgeon: Trevor Scotland, MD;  Location: [facility];  Service: Urology;  Laterality: Left;   URETEROSCOPY Left 04/10/2023   Procedure: DIAGNOSTIC URETEROSCOPY;  Surgeon: Trevor Scotland, MD;  Location: [facility];  Service: Urology;  Laterality: Left;    Home Medications:  Allergies as of 04/19/2023       Reactions   Ceftriaxone Hives   Tolerated 1st generation cephalosporin (CEFAZOLIN) on 06/20/2018 and 09/11/2019 without documented ADRs. Previously tolerated amoxicillin Blisters and Hives around mouth and neck.  Some blistering of lips.  Mouth not effected.  Patient started on ceftriaxone and doxycyline 1-2 days prior to reaction.     Doxycycline Hives   Blisters and Hives around mouth and neck.  Some blistering of lips.  Mouth not effected. Patient started on ceftriaxone and doxycyline 1-2 days prior  to reaction.     Macrobid [nitrofurantoin] Rash        Medication List        Accurate as of Apr 19, 2023  4:51 PM. If you have any questions, ask your nurse or doctor.          STOP taking these medications    ciprofloxacin 500 MG tablet Commonly known as: CIPRO Stopped by: Trevor Scotland, MD   oxybutynin 5 MG tablet Commonly known as: DITROPAN Stopped by: Trevor Scotland, MD       TAKE these medications    aspirin EC 81 MG tablet Take 81 mg by mouth daily.   carvedilol 3.125 MG tablet Commonly known as: COREG Take 1 tablet (3.125 mg total) by mouth 2 (two) times daily with a meal.   cyanocobalamin 500 MCG tablet Commonly known as: VITAMIN B12 Take 500 mcg by mouth daily.   finasteride 5 MG tablet Commonly known as: PROSCAR Take 1 tablet (5 mg total) by mouth daily. What changed: when to take this   fluticasone 50 MCG/ACT nasal  spray Commonly known as: FLONASE Place 2 sprays into both nostrils daily as needed for allergies.   HYDROcodone-acetaminophen 5-325 MG tablet Commonly known as: NORCO/VICODIN Take 1-2 tablets by mouth every 6 (six) hours as needed for moderate pain.   IRON PO Take 1 tablet by mouth daily.   levothyroxine 25 MCG tablet Commonly known as: SYNTHROID Take 25 mcg by mouth daily before breakfast.   lisinopril-hydrochlorothiazide 20-25 MG tablet Commonly known as: ZESTORETIC Take 0.5 tablets by mouth every morning.   multivitamin with minerals Tabs tablet Take 1 tablet by mouth daily.   Omeprazole 20 MG Tbec Take 20 mg by mouth every morning.   OVER THE COUNTER MEDICATION Take 2 tablets by mouth daily. Arthritis/Joint supplement, Omega XL   senna-docusate 8.6-50 MG tablet Commonly known as: Senokot-S Take 2 tablets by mouth at bedtime as needed for mild constipation.   simvastatin 40 MG tablet Commonly known as: ZOCOR Take 40 mg by mouth every morning.   tamsulosin 0.4 MG Caps capsule Commonly known as: FLOMAX Take 1  capsule (0.4 mg total) by mouth daily. What changed: when to take this   traZODone 100 MG tablet Commonly known as: DESYREL Take 100 mg by mouth at bedtime.   VITAMIN D PO Take 1 tablet by mouth daily at 2 PM.        Allergies:  Allergies  Allergen Reactions   Ceftriaxone Hives    Tolerated 1st generation cephalosporin (CEFAZOLIN) on 06/20/2018 and 09/11/2019 without documented ADRs. Previously tolerated amoxicillin  Blisters and Hives around mouth and neck.  Some blistering of lips.  Mouth not effected.  Patient started on ceftriaxone and doxycyline 1-2 days prior to reaction.     Doxycycline Hives    Blisters and Hives around mouth and neck.  Some blistering of lips.  Mouth not effected. Patient started on ceftriaxone and doxycyline 1-2 days prior to reaction.     Macrobid [Nitrofurantoin] Rash    Family History: Family History  Problem Relation Age of Onset   Hyperlipidemia Brother    Hyperlipidemia Sister    Heart attack Father     Social History:  reports that he has never smoked. He has never been exposed to tobacco smoke. He has never used smokeless tobacco. He reports that he does not drink alcohol and does not use drugs.   Physical Exam: BP (!) 142/77   Pulse 77   Ht 5\' 7"  (1.702 m)   Wt 174 lb (78.9 kg)   BMI 27.25 kg/m   Constitutional:  Alert and oriented, No acute distress. HEENT: [place] AT, moist mucus membranes.  Trachea midline, no masses. Neurologic: Grossly intact, no focal deficits, moving all 4 extremities. Psychiatric: Normal mood and affect.   Assessment & Plan:    1. Left upper tract urothelial carcinoma, high grade -We had a lengthy discussion today about the natural history of upper tract urothelial carcinoma and treatment options for high-grade disease.  Tumor size appears significant and is likely not amenable to local therapy, please see previous note for details. - Preoperative UA today - Stent removal scheduled for tomorrow. - CT of  the chest, abdomen, and pelvis for complete staging to ensure the cancer has not metastasized once the stent is removed - Discussion on the risks and benefits of neoadjuvant chemotherapy (gemcitabine and cisplatin) versus proceeding directly to nephroureterectomy. Given the his age and health concerns, including a history of kidney function issues, neoadjuvant chemotherapy would be very difficult to tolerate - He will consider discussing  neoadjuvant chemotherapy with an oncologist but is currently leaning against it; he will let us know if he would like medical oncology consult to discuss this further -Discussed robotic nephroureterectomy. Surgical approach will be minimally invasive, utilizing robotic assistance to reduce recovery time and complications. A Foley catheter will be placed for 7 days after the surgery to allow the bladder to heal. We discussed the management of potential complications, including bleeding, infection, and injury to surrounding structures during surgery, hernia formation and more serious complications including heart attack stroke death etc. - Postoperative care will include monitoring for CKD progression and managing a solitary kidney, with emphasis on avoiding certain medications and dehydration. - We discussed  potential need for adjuvant chemotherapy if postoperative staging reveals more advanced disease which he may or may not be able to tolerate pending his renal function - He is advised to take a minimum of two to three weeks off work post-surgery to ensure proper recovery prefer he take at least 6 weeks if possible, lifting restrictions reviewed.  I have reviewed the above documentation for accuracy and completeness, and I agree with the above.   Trevor Scotland, MD   I spent 55 total minutes on the day of the encounter including pre-visit review of the medical record, face-to-face time with the patient, and post visit ordering of labs/imaging/tests.   [facility] [address], [address] [place], [place] [address] [phone]

## 2023-04-20 ENCOUNTER — Ambulatory Visit (INDEPENDENT_AMBULATORY_CARE_PROVIDER_SITE_OTHER): Payer: Medicare Other | Admitting: Urology

## 2023-04-20 ENCOUNTER — Other Ambulatory Visit: Payer: Self-pay | Admitting: Urology

## 2023-04-20 VITALS — BP 117/68 | HR 68

## 2023-04-20 DIAGNOSIS — Z466 Encounter for fitting and adjustment of urinary device: Secondary | ICD-10-CM

## 2023-04-20 DIAGNOSIS — C642 Malignant neoplasm of left kidney, except renal pelvis: Secondary | ICD-10-CM

## 2023-04-20 LAB — URINALYSIS, COMPLETE
Bilirubin, UA: NEGATIVE
Glucose, UA: NEGATIVE
Ketones, UA: NEGATIVE
Nitrite, UA: NEGATIVE
Specific Gravity, UA: 1.025 (ref 1.005–1.030)
Urobilinogen, Ur: 0.2 mg/dL (ref 0.2–1.0)
pH, UA: 5.5 (ref 5.0–7.5)

## 2023-04-20 LAB — MICROSCOPIC EXAMINATION: RBC, Urine: >30 /hpf — AB (ref 0–2)

## 2023-04-20 MED ORDER — SULFAMETHOXAZOLE-TRIMETHOPRIM 800-160 MG PO TABS
1.0000 | ORAL_TABLET | Freq: Once | ORAL | Status: AC
Start: 2023-04-20 — End: 2023-04-20
  Administered 2023-04-20: 1 via ORAL

## 2023-04-20 NOTE — Progress Notes (Signed)
Surgical Physician Order Form [facility] [place]  * Scheduling expectation : Next Available; must have CT chest abdomen pelvis complete prior to surgery without evidence of metastatic disease  *Length of Case:   *Clearance needed: no  *Anticoagulation Instructions: Hold all anticoagulants  *Aspirin Instructions: Hold Aspirin  *Post-op visit Date/Instructions:   1 week Foley catheter removal, 6 weeks with MD for wound check  *Diagnosis: Left upper tract urothelial carcinoma  *Procedure: left  nephro ureterectomy ; instillation of intravesical gemcitabine   Additional orders: Gemcitabine 2000mg  bladder instillation  -Admit type: INpatient  -Anesthesia: General  -VTE Prophylaxis Standing Order SCD's       Other:   -Standing Lab Orders Per Anesthesia    Lab other: UA&Urine Culture; CBC, CMP, type and cross x 2, INR  -Standing Test orders EKG/Chest x-ray per Anesthesia       Test other:   - Medications:  Ancef 2gm IV  -Other orders:  N/A

## 2023-04-20 NOTE — Progress Notes (Signed)
   04/20/23  CC:  Chief Complaint  Patient presents with   Cysto Stent Removal    HPI: 77 year old male with left upper tract urothelial carcinoma who presents today for stent removal, see previous notes for details  Blood pressure 117/68, pulse 68. NED. A&Ox3.   No respiratory distress   Abd soft, NT, ND Normal phallus with bilateral descended testicles  Cystoscopy/ Stent removal procedure  Patient identification was confirmed, informed consent was obtained, and patient was prepped using Betadine solution.  Lidocaine jelly was administered per urethral meatus.    Preoperative abx where received prior to procedure.    Procedure: - Flexible cystoscope introduced, without any difficulty.   - Thorough search of the bladder revealed:    normal urethral meatus  Stent seen emanating from left ureteral orifice, grasped with stent graspers, and removed in entirety.     Post-Procedure: - Patient tolerated the procedure well    [name], MD

## 2023-04-23 LAB — CULTURE, URINE COMPREHENSIVE

## 2023-04-24 ENCOUNTER — Other Ambulatory Visit: Payer: Self-pay

## 2023-04-24 ENCOUNTER — Telehealth: Payer: Self-pay

## 2023-04-24 DIAGNOSIS — R8279 Other abnormal findings on microbiological examination of urine: Secondary | ICD-10-CM

## 2023-04-24 MED ORDER — LEVOFLOXACIN 500 MG PO TABS
500.0000 mg | ORAL_TABLET | Freq: Every day | ORAL | 0 refills | Status: DC
Start: 2023-04-24 — End: 2023-05-10

## 2023-04-24 NOTE — Telephone Encounter (Signed)
I spoke with [name]. We have discussed possible surgery dates and Monday June 3rd, 2024 was agreed upon by all parties. Patient given information about surgery date, what to expect pre-operatively and post operatively.  We discussed that a Pre-Admission Testing office will be calling to set up the pre-op visit that will take place prior to surgery, and that these appointments are typically done over the phone with a Pre-Admissions RN. Informed patient that our office will communicate any additional care to be provided after surgery. Patients questions or concerns were discussed during our call. Advised to call our office should there be any additional information, questions or concerns that arise. Patient verbalized understanding.

## 2023-04-24 NOTE — Progress Notes (Signed)
   [facility] Posting Form  Surgery Date: Date: 05/15/2023  Surgeon: Dr. [name], MD and Dr. Legrand Rams, MD  Inpt ( Yes  )   Outpt (No)   Obs ( No  )   Diagnosis: C64.2 Left Upper Tract Urothelial Carcinoma  -CPT: 13086, [phone]  Surgery: Left Robotic Assisted Laparoscopic Nephrouretectomy with Bladder Instillation of Gemcitabine  Stop Anticoagulations: Yes and also hold ASA  Cardiac/Medical/Pulmonary Clearance needed: no  *Orders entered into EPIC  Date: 04/24/23   *Case booked in EPIC  Date: 04/24/23  *Notified pt of Surgery: Date: 04/24/23  PRE-OP UA & CX: yes, and will also obtain, CBC, CMP, Type and Cross and INR  *Placed into Prior Authorization Work [place] Date: 04/24/23  Assistant/laser/rep:Yes, Dr. Richardo Hanks to assist

## 2023-04-24 NOTE — Addendum Note (Signed)
Addended by: Letta Kocher A on: 04/24/2023 12:04 PM   Modules accepted: Orders

## 2023-04-24 NOTE — Progress Notes (Signed)
From Dr Kem Parkinson result note: Urine culture: Low colony count of Enterococcus.  Please treat with Levaquin 500 mg daily for 5 days as a precaution.

## 2023-04-25 NOTE — Addendum Note (Signed)
Addended by: Letta Kocher A on: 04/25/2023 02:37 PM   Modules accepted: Orders

## 2023-05-02 ENCOUNTER — Ambulatory Visit
Admission: RE | Admit: 2023-05-02 | Discharge: 2023-05-02 | Disposition: A | Discharge status: Home / Self Care | Payer: Medicare Other | Source: Ambulatory Visit | Attending: Urology | Admitting: Urology

## 2023-05-02 DIAGNOSIS — C642 Malignant neoplasm of left kidney, except renal pelvis: Secondary | ICD-10-CM | POA: Diagnosis present

## 2023-05-02 MED ORDER — IOHEXOL 300 MG/ML  SOLN
100.0000 mL | Freq: Once | INTRAMUSCULAR | Status: AC | PRN
  Administered 2023-05-02: 100 mL via INTRAVENOUS

## 2023-05-05 ENCOUNTER — Telehealth: Payer: Self-pay

## 2023-05-05 NOTE — Telephone Encounter (Signed)
Return call made to pt. @[phone] .  Pt. Given number to [facility] billing dept. [phone] and instructed to call them regarding billing questions.  Pt. Verbalized understanding

## 2023-05-05 NOTE — Telephone Encounter (Signed)
Pt. Called in on triage line @1251  inquiring about his bill from the clinic.

## 2023-05-08 ENCOUNTER — Inpatient Hospital Stay: Admission: RE | Admit: 2023-05-08 | Payer: Medicare Other | Source: Ambulatory Visit

## 2023-05-10 ENCOUNTER — Encounter
Admission: RE | Admit: 2023-05-10 | Discharge: 2023-05-10 | Disposition: A | Discharge status: Home / Self Care | Payer: Medicare Other | Source: Ambulatory Visit | Attending: Urology | Admitting: Urology

## 2023-05-10 ENCOUNTER — Encounter: Payer: Self-pay | Admitting: Urology

## 2023-05-10 DIAGNOSIS — C642 Malignant neoplasm of left kidney, except renal pelvis: Secondary | ICD-10-CM

## 2023-05-10 DIAGNOSIS — Z01818 Encounter for other preprocedural examination: Secondary | ICD-10-CM | POA: Diagnosis present

## 2023-05-10 DIAGNOSIS — Z0289 Encounter for other administrative examinations: Secondary | ICD-10-CM

## 2023-05-10 LAB — CBC
HCT: 31.6 % — ABNORMAL LOW (ref 39.0–52.0)
Hemoglobin: 10.1 g/dL — ABNORMAL LOW (ref 13.0–17.0)
MCH: 28.1 pg (ref 26.0–34.0)
MCHC: 32 g/dL (ref 30.0–36.0)
MCV: 88 fL (ref 80.0–100.0)
Platelets: 118 10*3/uL — ABNORMAL LOW (ref 150–400)
RBC: 3.59 MIL/uL — ABNORMAL LOW (ref 4.22–5.81)
RDW: 13.7 % (ref 11.5–15.5)
WBC: 4.5 10*3/uL (ref 4.0–10.5)
nRBC: 0 % (ref 0.0–0.2)

## 2023-05-10 LAB — COMPREHENSIVE METABOLIC PANEL
ALT: 19 U/L (ref 0–44)
AST: 30 U/L (ref 15–41)
Albumin: 3.9 g/dL (ref 3.5–5.0)
Alkaline Phosphatase: 69 U/L (ref 38–126)
Anion gap: 9 (ref 5–15)
BUN: 20 mg/dL (ref 8–23)
CO2: 24 mmol/L (ref 22–32)
Calcium: 9.2 mg/dL (ref 8.9–10.3)
Chloride: 103 mmol/L (ref 98–111)
Creatinine, Ser: 1.28 mg/dL — ABNORMAL HIGH (ref 0.61–1.24)
GFR, Estimated: 58 mL/min — ABNORMAL LOW (ref >60)
Glucose, Bld: 108 mg/dL — ABNORMAL HIGH (ref 70–99)
Potassium: 3.7 mmol/L (ref 3.5–5.1)
Sodium: 136 mmol/L (ref 135–145)
Total Bilirubin: 0.5 mg/dL (ref 0.3–1.2)
Total Protein: 7 g/dL (ref 6.5–8.1)

## 2023-05-10 LAB — TYPE AND SCREEN: Antibody Screen: NEGATIVE

## 2023-05-10 LAB — URINALYSIS, COMPLETE (UACMP) WITH MICROSCOPIC
RBC / HPF: >50 RBC/hpf (ref 0–5)
Specific Gravity, Urine: 1.014 (ref 1.005–1.030)
Squamous Epithelial / HPF: NONE SEEN /HPF (ref 0–5)
WBC, UA: >50 WBC/hpf (ref 0–5)

## 2023-05-10 LAB — PROTIME-INR
INR: 1.1 (ref 0.8–1.2)
Prothrombin Time: 14.5 seconds (ref 11.4–15.2)

## 2023-05-10 NOTE — Progress Notes (Signed)
Perioperative / Anesthesia Services  Pre-Admission Testing Clinical Review / Preoperative Anesthesia Consult  Date: 05/12/23  Patient Demographics:  Name: Trevor [name] DOB:   10/15/1946 MRN:   [id number]  Planned Surgical Procedure(s):    Case: [id number] Date/Time: 05/15/23 0715   Procedures:      XI ROBOT ASSITED LAPAROSCOPIC NEPHROURETERECTOMY (Left)     BLADDER INSTILLATION OF GEMCITABINE   Anesthesia type: General   Pre-op diagnosis: Left upper tract urothelial carcinoma   Location: [facility] OR ROOM 07 / [facility]   Surgeons: [name], MD     NOTE: Available PAT nursing documentation and vital signs have been reviewed. Clinical nursing staff has updated patient's PMH/PSHx, current medication list, and drug allergies/intolerances to ensure comprehensive history available to assist in medical decision making as it pertains to the aforementioned surgical procedure and anticipated anesthetic course. Extensive review of available clinical information personally performed. [place] PM[facility] and PSHx updated with any diagnoses/procedures that  may have been inadvertently omitted during his intake with the pre-admission testing department's nursing staff.  Clinical Discussion:  Trevor [name] is a 77 y.o. male who is submitted for pre-surgical anesthesia review and clearance prior to him undergoing the above procedure. Patient has never been a smoker. Pertinent PMH includes: CAD, aortic stenosis, NSVT, diastolic dysfunction, cardiac murmur, HTN, HLD, prediabetes, GERD (on daily PPI), OSAH (does not utilize nocturnal PAP therapy), splenomegaly, cirrhosis, anemia, thrombocytopenia, chronic lower back pain, lumbar spondylosis, remote bladder cancer, cervical DDD, BPH, anxiety, depression, sleep difficulties.  Patient is followed by cardiology Darrold Junker, MD). He was last seen in the cardiology clinic on 12/09/2022; notes reviewed. At the time of his clinic visit, patient doing  well overall from a cardiovascular perspective. Patient denied any chest pain, shortness of breath, PND, orthopnea, palpitations, significant peripheral edema, weakness, fatigue, vertiginous symptoms, or presyncope/syncope. Patient with a past medical history significant for cardiovascular diagnoses. Documented physical exam was grossly benign, providing no evidence of acute exacerbation and/or decompensation of the patient's known cardiovascular conditions.  Long-term cardiac event monitor study performed on 10/05/2022 revealed a predominant underlying sinus rhythm with a average heart rate of 65 bpm; range 45-108 bpm.  There were occasional PACs.  There were no significant arrhythmias or pauses noted.  Most recent TTE was performed on 10/12/2022 revealing normal left ventricular systolic function with an EF of >55%.  There was mild concentric LVH.  There were no regional wall motion abnormalities. Left ventricular diastolic Doppler parameters consistent with abnormal relaxation (G1DD).  There was mild biatrial and mild right ventricular enlargement.  Trivial to mild pan valvular regurgitation observed.  Mild aortic valve stenosis noted with a mean pressure gradient of 8.2 mmHg; AVA (VTI) = 1.3 cm.  Blood pressure well controlled at 124/74 mmHg on currently prescribed beta-blocker (carvedilol), ACEi (lisinopril) and diuretic (HCTZ) therapies. Patient is on simvastatin for his HLD diagnosis and ASCVD prevention.  Patient has a prediabetes diagnosis; last HgbA1c at the time of his cardiology visit was 6.0%.  Of note, level has been rechecked since patient was last seen with further improvement to 5.6% on 02/21/2023.  Patient does have an OSAH diagnosis, however he does not utilize nocturnal PAP therapy.  Patient makes efforts to remain active.  He continues to work full-time.  Patient does not have a standard exercise routine. Functional capacity, as defined by [facility]AS[facility], is documented as being >/= 4 METS.  No  changes were made to his medication regimen.  Patient to follow-up with  outpatient cardiology in 4 months or sooner if needed.  Trevor [name] is scheduled for a XI ROBOT ASSITED LAPAROSCOPIC NEPHROURETERECTOMY; BLADDER INSTILLATION OF GEMCITABINE on 05/15/2023 with Dr. [name], MD.  Given patient's past medical history significant for cardiovascular diagnoses, presurgical cardiac clearance was sought by the PAT team. Per cardiology, "this patient is optimized for surgery and may proceed with the planned procedural course with a LOW risk of significant perioperative cardiovascular complications".   In review of his medication reconciliation, it is noted that patient is currently on prescribed daily antithrombotic therapy.  Patient has been instructed on recommendations from his cardiologist for holding his daily low-dose ASA for 5 days prior to his procedure with plans to restart since postoperatively respectively minimized by his primary attending surgeon.  Patient is aware that his last dose of ASA should be on 05/09/2023.  Patient denies previous perioperative complications with anesthesia in the past. In review of the available records, it is noted that patient underwent a general anesthetic course here at [facility] ([facility]) in 03/2023 without documented complications.      05/10/2023    1:00 PM 04/20/2023    1:10 PM 04/19/2023    4:07 PM  Vitals with BMI  Height 5\' 7"   5\' 7"   Weight 174 lbs 3 oz  174 lbs  BMI 27.27  27.25  Systolic  117 142  Diastolic  68 77  Pulse  68 77    Providers/Specialists:   NOTE: Primary physician provider listed below. Patient may have been seen by APP or partner within same practice.   PROVIDER ROLE / SPECIALTY LAST Jaquelyn Bitter, MD Urology (Surgeon) 04/20/2023  Kandyce Rud, MD Primary Care Provider 02/28/2023  Marcina Millard, MD Cardiology 12/09/2022   Allergies:  Ceftriaxone, Doxycycline, and  Macrobid [nitrofurantoin]  Current Home Medications:    bcg vaccine injection 81 mg    aspirin 81 MG EC tablet   carvedilol (COREG) 3.125 MG tablet   Ferrous Sulfate (IRON PO)   finasteride (PROSCAR) 5 MG tablet   fluticasone (FLONASE) 50 MCG/ACT nasal spray   levothyroxine (SYNTHROID) 25 MCG tablet   lisinopril-hydrochlorothiazide (ZESTORETIC) 20-25 MG tablet   Multiple Vitamin (MULTIVITAMIN WITH MINERALS) TABS tablet   Omeprazole 20 MG TBEC   OVER THE COUNTER MEDICATION   senna-docusate (SENOKOT-S) 8.6-50 MG tablet   simvastatin (ZOCOR) 40 MG tablet   tamsulosin (FLOMAX) 0.4 MG CAPS capsule   traZODone (DESYREL) 100 MG tablet   vitamin B-12 (CYANOCOBALAMIN) 500 MCG tablet   VITAMIN D PO   History:   Past Medical History:  Diagnosis Date   Acne    AKI (acute kidney injury) (HCC)    Allergic rhinitis    Anemia    Anxiety    Aortic stenosis    a.) TTE 10/12/2022: mild AS (MPG 8.2); AVA (VTI) = 1.3 cm2   Bladder cancer (HCC)    a.) remission since Oct 2014   BPH (benign prostatic hypertrophy) with urinary obstruction    CAD (coronary artery disease)    Carpal tunnel syndrome    Cervicalgia    Chronic calcific pancreatitis (HCC)    Chronic low back pain    Cirrhosis (HCC)    Degenerative disc disease, cervical    Depression    Diastolic dysfunction    a.) TTE 10/12/2022: EF >55%, mild LVH, mild BAE, mild RVE, triv AR/PR, mild MR/TR, G1DD   Gastritis    GERD (gastroesophageal reflux disease)  Heart murmur    Hemorrhoids    History of malignant neoplasm of bladder    Hyperlipidemia    Hypertension    Hypokalemia    Hypospadias    Lumbar spondylosis    a.) s/p L2-4 PLIF with L4-5 and L5-S1 interbody fusion   Non-sustained ventricular tachycardia (HCC) 09/2022   a.) 8 beat episode of NSVT during admission in setting urosepsis   Pre-diabetes    Sepsis (HCC)    Sleep apnea    a.) does not utilize nocturnal PAP therapy   Sleep difficulties    a.) on trazodone  PRN   Splenomegaly    Thrombocytopenia (HCC)    Urinary frequency    Past Surgical History:  Procedure Laterality Date   ADENOIDECTOMY     ANTERIOR FUSION LUMBAR SPINE  11/20/2017   multiple levels; Dr. Myer Haff   ANTERIOR LAT LUMBAR FUSION N/A 09/11/2019   Procedure: PRONE XLIF L2-3;  Surgeon: Venetia Night, MD;  Location: [facility] ORS;  Service: Neurosurgery;  Laterality: N/A;   BIOPSY Left 03/13/2023   Procedure: BIOPSY;  Surgeon: [name], MD;  Location: [facility] ORS;  Service: Urology;  Laterality: Left;   BLADDER SURGERY     2011, 2013, 2015   COLONOSCOPY WITH PROPOFOL N/A 07/31/2015   Procedure: COLONOSCOPY WITH PROPOFOL;  Surgeon: Scot Jun, MD;  Location: [facility] ENDOSCOPY;  Service: Endoscopy;  Laterality: N/A;   COLONOSCOPY, ESOPHAGOGASTRODUODENOSCOPY (EGD) AND ESOPHAGEAL DILATION  2006   CYSTOSCOPY W/ RETROGRADES Bilateral 11/11/2021   Procedure: CYSTOSCOPY WITH RETROGRADE PYELOGRAM;  Surgeon: [name], MD;  Location: [facility] ORS;  Service: Urology;  Laterality: Bilateral;   CYSTOSCOPY W/ RETROGRADES Bilateral 03/13/2023   Procedure: CYSTOSCOPY WITH RETROGRADE PYELOGRAM;  Surgeon: [name], MD;  Location: [facility] ORS;  Service: Urology;  Laterality: Bilateral;   CYSTOSCOPY W/ RETROGRADES Left 04/10/2023   Procedure: CYSTOSCOPY WITH RETROGRADE PYELOGRAM;  Surgeon: [name], MD;  Location: [facility] ORS;  Service: Urology;  Laterality: Left;   CYSTOSCOPY W/ URETERAL STENT PLACEMENT Left 04/10/2023   Procedure: CYSTOSCOPY WITH STENT EXCHANGE;  Surgeon: [name], MD;  Location: [facility] ORS;  Service: Urology;  Laterality: Left;   CYSTOSCOPY WITH BIOPSY N/A 11/11/2021   Procedure: CYSTOSCOPY WITH BLADDER BIOPSY;  Surgeon: [name], MD;  Location: [facility] ORS;  Service: Urology;  Laterality: N/A;   CYSTOSCOPY WITH BIOPSY N/A 03/13/2023   Procedure: CYSTOSCOPY WITH BLADDER BIOPSY;  Surgeon: [name], MD;  Location: [facility] ORS;  Service: Urology;  Laterality:  N/A;   FLEXIBLE SIGMOIDOSCOPY  1994   LUMBAR LAMINECTOMY/DECOMPRESSION MICRODISCECTOMY N/A 06/20/2018   Procedure: LUMBAR LAMINECTOMY/DECOMPRESSION MICRODISCECTOMY 1 LEVEL-L-2-3;  Surgeon: Venetia Night, MD;  Location: [facility] ORS;  Service: Neurosurgery;  Laterality: N/A;   POSTERIOR FUSION LUMBAR SPINE  10/02/2019   L2-4   POSTERIOR LUMBAR FUSION 4 WITH HARDWARE REMOVAL N/A 09/11/2019   Procedure: L2-S1 POSTERIOR FUSION, ABORTED;  Surgeon: Venetia Night, MD;  Location: [facility] ORS;  Service: Neurosurgery;  Laterality: N/A;   SEPTOPLASTY  1996   TONSILLECTOMY     URETERAL BIOPSY Left 04/10/2023   Procedure: URETERAL BIOPSY WITH POSSIBLE ABLATION OF TUMOR;  Surgeon: [name], MD;  Location: [facility] ORS;  Service: Urology;  Laterality: Left;   URETEROSCOPY Left 03/13/2023   Procedure: URETEROSCOPY;  Surgeon: [name], MD;  Location: [facility] ORS;  Service: Urology;  Laterality: Left;   URETEROSCOPY Left 04/10/2023   Procedure: DIAGNOSTIC URETEROSCOPY;  Surgeon: [name], MD;  Location: [facility] ORS;  Service: Urology;  Laterality: Left;   Family History  Problem Relation Age of Onset   Hyperlipidemia Brother    Hyperlipidemia Sister    Heart attack Father    Social History   Tobacco Use   Smoking status: Never    Passive exposure: Never   Smokeless tobacco: Never  Vaping Use   Vaping Use: Never used  Substance Use Topics   Alcohol use: No    Alcohol/week: 0.0 standard drinks of alcohol   Drug use: No    Pertinent Clinical Results:  LABS:   Lab Results  Component Value Date   WBC 4.5 05/10/2023   HGB 10.1 (L) 05/10/2023   HCT 31.6 (L) 05/10/2023   MCV 88.0 05/10/2023   PLT 118 (L) 05/10/2023   Lab Results  Component Value Date   NA 136 05/10/2023   K 3.7 05/10/2023   CO2 24 05/10/2023   GLUCOSE 108 (H) 05/10/2023   BUN 20 05/10/2023   CREATININE 1.28 (H) 05/10/2023   CALCIUM 9.2 05/10/2023   GFRNONAA 58 (L) 05/10/2023    ECG: Date: 04/03/2023 Time  ECG obtained: 1554 PM Rate: 72 bpm Rhythm: normal sinus Axis (leads I and aVF): Right axis deviation Intervals: PR 142 ms. QRS 74 ms. QTc 427 ms. ST segment and T wave changes: No significant ST or T wave abnormalities  Comparison: Similar to previous tracing obtained on 09/24/2022    IMAGING / PROCEDURES: CT CHEST ABDOMEN PELVIS W CONTRAST performed on 05/02/2023 1.8 cm soft tissue density within the lower pole left renal collecting system is consistent with urothelial carcinoma. No evidence of metastatic disease. Advanced cirrhosis without hepatocellular carcinoma Subtle subpleural ground-glass and reticulation without craniocaudal gradient is suspicious for interstitial lung disease, possibly early nonspecific interstitial pneumonia. Correlate with pulmonary symptoms and consider high-resolution chest CT follow-up at 1 year. Chronic calcific pancreatitis. Aortic atherosclerosis   TRANSTHORACIC ECHOCARDIOGRAM performed on 10/12/2022 Normal left ventricular systolic function with an EF of >55%  Mild concentric LVH No regional wall motion abnormalities Left ventricular diastolic Doppler parameters consistent with abnormal relaxation (G1DD). Mild right ventricular enlargement mild biatrial enlargement Trivial AR and PR Mild MR and TR Trivial aortic stenosis with a mean pressure gradient of 8.2 mmHg; AVA (VTI) = 1.3 cm No pericardial effusion   Impression and Plan:  Trevor [name] has been referred for pre-anesthesia review and clearance prior to him undergoing the planned anesthetic and procedural courses. Available labs, pertinent testing, and imaging results were personally reviewed by me in preparation for upcoming operative/procedural course. [facility] medical record has been updated following extensive record review and patient interview with PAT staff.   This patient has been appropriately cleared by cardiology with an overall LOW risk of significant perioperative cardiovascular  complications. Based on clinical review performed today (05/12/23), barring any significant acute changes in the patient's overall condition, it is anticipated that he will be able to proceed with the planned surgical intervention. Any acute changes in clinical condition may necessitate his procedure being postponed and/or cancelled. Patient will meet with anesthesia team (MD and/or CRNA) on the day of his procedure for preoperative evaluation/assessment. Questions regarding anesthetic course will be fielded at that time.   Pre-surgical instructions were reviewed with the patient during his PAT appointment, and questions were fielded to satisfaction by PAT clinical staff. He has been instructed on which medications that he will need to hold prior to surgery, as well as the ones that have been deemed safe/appropriate to take of the day of his procedure. As part of the general education  provided by PAT, patient made aware both verbally and in writing, that he would need to abstain from the use of any illegal substances during his perioperative course.  He was advised that failure to follow the provided instructions could necessitate case cancellation or result serious perioperative complications up to and including death. Patient encouraged to contact PAT and/or his surgeon's office to discuss any questions or concerns that may arise prior to surgery; verbalized understanding.   Quentin Mulling, MSN, APRN, FNP-C, CEN [facility]  Peri-operative Services Nurse Practitioner Phone: [phone] Fax: [phone] 05/12/23 2:56 PM  NOTE: This note has been prepared using Dragon dictation software. Despite my best ability to proofread, there is always the potential that unintentional transcriptional errors may still occur from this process.

## 2023-05-10 NOTE — Patient Instructions (Addendum)
Your procedure is scheduled on:05-15-23 Monday Report to the Registration Desk on the 1st floor of the Medical Mall.Then proceed to the 2nd floor Surgery Desk To find out your arrival time, please call [phone] between 1PM - 3PM on:05-12-23 Friday If your arrival time is 6:00 am, do not arrive before that time as the Medical Mall entrance doors do not open until 6:00 am.  REMEMBER: Instructions that are not followed completely may result in serious medical risk, up to and including death; or upon the discretion of your surgeon and anesthesiologist your surgery may need to be rescheduled.  Do not eat food OR drink any liquids after midnight the night before surgery.  No gum chewing or hard candies.  CLEAR LIQUIDS ONLY the day prior to your surgery (Sunday) as instructed by Dr. Apolinar Junes   One week prior to surgery: Stop Anti-inflammatories (NSAIDS) such as Advil, Aleve, Ibuprofen, Motrin, Naproxen, Naprosyn and Aspirin based products such as Excedrin, Goody's Powder, BC Powder.You may however, take Tylenol if needed for pain up until the day of surgery. Stop ANY OVER THE COUNTER supplements/vitamins until after surgery (Last dose was on 05-05-23)  Last dose of 81 mg Aspirin was on 05-05-23   TAKE ONLY THESE MEDICATIONS THE MORNING OF SURGERY WITH A SIP OF WATER: -carvedilol (COREG)  -levothyroxine (SYNTHROID)  -Omeprazole-take one the night before and one on the morning of surgery - helps to prevent nausea after surgery.) (Patient can only take these 3 medications the morning of surgery as any more will make him sick since he can't have food)  No Alcohol for 24 hours before or after surgery.  No Smoking including e-cigarettes for 24 hours before surgery.  No chewable tobacco products for at least 6 hours before surgery.  No nicotine patches on the day of surgery.  Do not use any "recreational" drugs for at least a week (preferably 2 weeks) before your surgery.  Please be advised that  the combination of cocaine and anesthesia may have negative outcomes, up to and including death. If you test positive for cocaine, your surgery will be cancelled.  On the morning of surgery brush your teeth with toothpaste and water, you may rinse your mouth with mouthwash if you wish. Do not swallow any toothpaste or mouthwash.  Use CHG Soap as directed on instruction sheet.  Do not wear jewelry, make-up, hairpins, clips or nail polish.  Do not wear lotions, powders, or perfumes.   Do not shave body hair from the neck down 48 hours before surgery.  Contact lenses, hearing aids and dentures may not be worn into surgery.  Do not bring valuables to the hospital. [facility] is not responsible for any missing/lost belongings or valuables.   Notify your doctor if there is any change in your medical condition (cold, fever, infection).  Wear comfortable clothing (specific to your surgery type) to the hospital.  After surgery, you can help prevent lung complications by doing breathing exercises.  Take deep breaths and cough every 1-2 hours. Your doctor may order a device called an Incentive Spirometer to help you take deep breaths. When coughing or sneezing, hold a pillow firmly against your incision with both hands. This is called "splinting." Doing this helps protect your incision. It also decreases belly discomfort.  If you are being admitted to the hospital overnight, leave your suitcase in the car. After surgery it may be brought to your room.  In case of increased patient census, it may be necessary for you,  the patient, to continue your postoperative care in the Same Day Surgery department.  If you are being discharged the day of surgery, you will not be allowed to drive home. You will need a responsible individual to drive you home and stay with you for 24 hours after surgery.   If you are taking public transportation, you will need to have a responsible individual with  you.  Please call the Pre-admissions Testing Dept. at [phone] if you have any questions about these instructions.  Surgery Visitation Policy:  Patients having surgery or a procedure may have two visitors.  Children under the age of 78 must have an adult with them who is not the patient.  Inpatient Visitation:    Visiting hours are 7 a.m. to 8 p.m. Up to four visitors are allowed at one time in a patient room. The visitors may rotate out with other people during the day.  One visitor age 21 or older may stay with the patient overnight and must be in the room by 8 p.m.     Preparing for Surgery with CHLORHEXIDINE GLUCONATE (CHG) Soap  Chlorhexidine Gluconate (CHG) Soap  o An antiseptic cleaner that kills germs and bonds with the skin to continue killing germs even after washing  o Used for showering the night before surgery and morning of surgery  Before surgery, you can play an important role by reducing the number of germs on your skin.  CHG (Chlorhexidine gluconate) soap is an antiseptic cleanser which kills germs and bonds with the skin to continue killing germs even after washing.  Please do not use if you have an allergy to CHG or antibacterial soaps. If your skin becomes reddened/irritated stop using the CHG.  1. Shower the NIGHT BEFORE SURGERY and the MORNING OF SURGERY with CHG soap.  2. If you choose to wash your hair, wash your hair first as usual with your normal shampoo.  3. After shampooing, rinse your hair and body thoroughly to remove the shampoo.  4. Use CHG as you would any other liquid soap. You can apply CHG directly to the skin and wash gently with a scrungie or a clean washcloth.  5. Apply the CHG soap to your body only from the neck down. Do not use on open wounds or open sores. Avoid contact with your eyes, ears, mouth, and genitals (private parts). Wash face and genitals (private parts) with your normal soap.  6. Wash thoroughly, paying special  attention to the area where your surgery will be performed.  7. Thoroughly rinse your body with warm water.  8. Do not shower/wash with your normal soap after using and rinsing off the CHG soap.  9. Pat yourself dry with a clean towel.  10. Wear clean pajamas to bed the night before surgery.  12. Place clean sheets on your bed the night of your first shower and do not sleep with pets.  13. Shower again with the CHG soap on the day of surgery prior to arriving at the hospital.  14. Do not apply any deodorants/lotions/powders.  15. Please wear clean clothes to the hospital.

## 2023-05-11 LAB — URINE CULTURE: Culture: NO GROWTH

## 2023-05-12 NOTE — Addendum Note (Signed)
Addended by: Letta Kocher A on: 05/12/2023 03:49 PM   Modules accepted: Orders

## 2023-05-15 ENCOUNTER — Inpatient Hospital Stay
Admission: RE | Admit: 2023-05-15 | Discharge: 2023-05-18 | DRG: 658 | Disposition: A | Discharge status: Home / Self Care | Payer: Medicare Other | Attending: Urology | Admitting: Urology

## 2023-05-15 ENCOUNTER — Other Ambulatory Visit: Payer: Self-pay

## 2023-05-15 ENCOUNTER — Encounter
Admission: RE | Disposition: A | Discharge status: Home / Self Care | Payer: Self-pay | Source: Home / Self Care | Attending: Urology

## 2023-05-15 ENCOUNTER — Encounter: Payer: Self-pay | Admitting: Urology

## 2023-05-15 ENCOUNTER — Inpatient Hospital Stay: Payer: Medicare Other | Admitting: Urgent Care

## 2023-05-15 DIAGNOSIS — K219 Gastro-esophageal reflux disease without esophagitis: Secondary | ICD-10-CM | POA: Diagnosis present

## 2023-05-15 DIAGNOSIS — C642 Malignant neoplasm of left kidney, except renal pelvis: Secondary | ICD-10-CM | POA: Diagnosis present

## 2023-05-15 DIAGNOSIS — Z79890 Hormone replacement therapy (postmenopausal): Secondary | ICD-10-CM

## 2023-05-15 DIAGNOSIS — Z83438 Family history of other disorder of lipoprotein metabolism and other lipidemia: Secondary | ICD-10-CM

## 2023-05-15 DIAGNOSIS — N401 Enlarged prostate with lower urinary tract symptoms: Secondary | ICD-10-CM | POA: Diagnosis present

## 2023-05-15 DIAGNOSIS — Z981 Arthrodesis status: Secondary | ICD-10-CM

## 2023-05-15 DIAGNOSIS — E785 Hyperlipidemia, unspecified: Secondary | ICD-10-CM | POA: Diagnosis present

## 2023-05-15 DIAGNOSIS — Z7982 Long term (current) use of aspirin: Secondary | ICD-10-CM

## 2023-05-15 DIAGNOSIS — I350 Nonrheumatic aortic (valve) stenosis: Secondary | ICD-10-CM | POA: Diagnosis present

## 2023-05-15 DIAGNOSIS — G4730 Sleep apnea, unspecified: Secondary | ICD-10-CM | POA: Diagnosis present

## 2023-05-15 DIAGNOSIS — Z8249 Family history of ischemic heart disease and other diseases of the circulatory system: Secondary | ICD-10-CM | POA: Diagnosis not present

## 2023-05-15 DIAGNOSIS — R7303 Prediabetes: Secondary | ICD-10-CM | POA: Diagnosis present

## 2023-05-15 DIAGNOSIS — I2510 Atherosclerotic heart disease of native coronary artery without angina pectoris: Secondary | ICD-10-CM | POA: Diagnosis present

## 2023-05-15 DIAGNOSIS — I10 Essential (primary) hypertension: Secondary | ICD-10-CM | POA: Diagnosis present

## 2023-05-15 DIAGNOSIS — C652 Malignant neoplasm of left renal pelvis: Secondary | ICD-10-CM | POA: Diagnosis not present

## 2023-05-15 DIAGNOSIS — Z8551 Personal history of malignant neoplasm of bladder: Secondary | ICD-10-CM

## 2023-05-15 DIAGNOSIS — Z2989 Encounter for other specified prophylactic measures: Secondary | ICD-10-CM

## 2023-05-15 DIAGNOSIS — Z881 Allergy status to other antibiotic agents status: Secondary | ICD-10-CM

## 2023-05-15 DIAGNOSIS — Z79899 Other long term (current) drug therapy: Secondary | ICD-10-CM | POA: Diagnosis not present

## 2023-05-15 DIAGNOSIS — K7460 Unspecified cirrhosis of liver: Secondary | ICD-10-CM | POA: Diagnosis present

## 2023-05-15 HISTORY — PX: BLADDER INSTILLATION: SHX6893

## 2023-05-15 HISTORY — DX: Other chronic pancreatitis: K86.1

## 2023-05-15 HISTORY — PX: ROBOT ASSITED LAPAROSCOPIC NEPHROURETERECTOMY: SHX6077

## 2023-05-15 HISTORY — DX: Other ill-defined heart diseases: I51.89

## 2023-05-15 LAB — BPAM RBC
Blood Product Expiration Date: 202406282359
Unit Type and Rh: 6200

## 2023-05-15 LAB — TYPE AND SCREEN
ABO/RH(D): A POS
Unit division: 0

## 2023-05-15 SURGERY — NEPHROURETERECTOMY, ROBOT-ASSISTED, LAPAROSCOPIC
Anesthesia: General | Site: Flank

## 2023-05-15 MED ORDER — FENTANYL CITRATE (PF) 100 MCG/2ML IJ SOLN
INTRAMUSCULAR | Status: AC
  Filled 2023-05-15: qty 2

## 2023-05-15 MED ORDER — ONDANSETRON HCL 4 MG/2ML IJ SOLN
4.0000 mg | INTRAMUSCULAR | Status: DC | PRN
  Administered 2023-05-18: 4 mg via INTRAVENOUS
  Filled 2023-05-15: qty 2

## 2023-05-15 MED ORDER — GEMCITABINE CHEMO FOR BLADDER INSTILLATION 2000 MG
2000.0000 mg | Freq: Once | INTRAVENOUS | Status: AC
  Administered 2023-05-15: 2000 mg via INTRAVESICAL
  Filled 2023-05-15: qty 2000

## 2023-05-15 MED ORDER — OXYBUTYNIN CHLORIDE 5 MG PO TABS: 5.0000 mg | ORAL_TABLET | Freq: Three times a day (TID) | ORAL | Status: DC | PRN

## 2023-05-15 MED ORDER — BUPIVACAINE HCL 0.5 % IJ SOLN
INTRAMUSCULAR | Status: DC | PRN
  Administered 2023-05-15: 20 mL

## 2023-05-15 MED ORDER — DIPHENHYDRAMINE HCL 12.5 MG/5ML PO ELIX: 12.5000 mg | ORAL_SOLUTION | Freq: Four times a day (QID) | ORAL | Status: DC | PRN

## 2023-05-15 MED ORDER — ONDANSETRON HCL 4 MG/2ML IJ SOLN
INTRAMUSCULAR | Status: AC
  Filled 2023-05-15: qty 2

## 2023-05-15 MED ORDER — LACTATED RINGERS IV SOLN: INTRAVENOUS | Status: DC

## 2023-05-15 MED ORDER — BUPIVACAINE LIPOSOME 1.3 % IJ SUSP
INTRAMUSCULAR | Status: DC | PRN
  Administered 2023-05-15: 30 mL via INTRAMUSCULAR

## 2023-05-15 MED ORDER — ACETAMINOPHEN 325 MG PO TABS: 650.0000 mg | ORAL_TABLET | ORAL | Status: DC | PRN

## 2023-05-15 MED ORDER — CEFAZOLIN SODIUM-DEXTROSE 2-4 GM/100ML-% IV SOLN
INTRAVENOUS | Status: AC
  Filled 2023-05-15: qty 100

## 2023-05-15 MED ORDER — SURGIFLO WITH THROMBIN (HEMOSTATIC MATRIX KIT) OPTIME
TOPICAL | Status: DC | PRN
  Administered 2023-05-15: 1 via TOPICAL

## 2023-05-15 MED ORDER — ROCURONIUM BROMIDE 100 MG/10ML IV SOLN
INTRAVENOUS | Status: DC | PRN
  Administered 2023-05-15: 20 mg via INTRAVENOUS
  Administered 2023-05-15: 10 mg via INTRAVENOUS
  Administered 2023-05-15: 50 mg via INTRAVENOUS
  Administered 2023-05-15 (×2): 10 mg via INTRAVENOUS

## 2023-05-15 MED ORDER — STERILE WATER FOR IRRIGATION IR SOLN
Status: DC | PRN
  Administered 2023-05-15: 1000 mL

## 2023-05-15 MED ORDER — EPHEDRINE 5 MG/ML INJ
INTRAVENOUS | Status: AC
  Filled 2023-05-15: qty 5

## 2023-05-15 MED ORDER — SUGAMMADEX SODIUM 200 MG/2ML IV SOLN
INTRAVENOUS | Status: DC | PRN
  Administered 2023-05-15: 50 mg via INTRAVENOUS
  Administered 2023-05-15: 150 mg via INTRAVENOUS

## 2023-05-15 MED ORDER — CEFAZOLIN SODIUM-DEXTROSE 2-4 GM/100ML-% IV SOLN
2.0000 g | Freq: Once | INTRAVENOUS | Status: AC
  Administered 2023-05-15: 2 g via INTRAVENOUS

## 2023-05-15 MED ORDER — ETOMIDATE 2 MG/ML IV SOLN
INTRAVENOUS | Status: AC
  Filled 2023-05-15: qty 10

## 2023-05-15 MED ORDER — ACETAMINOPHEN 10 MG/ML IV SOLN
INTRAVENOUS | Status: AC
  Filled 2023-05-15: qty 100

## 2023-05-15 MED ORDER — SODIUM CHLORIDE 0.9 % IV SOLN: INTRAVENOUS | Status: DC

## 2023-05-15 MED ORDER — DEXAMETHASONE SODIUM PHOSPHATE 10 MG/ML IJ SOLN
INTRAMUSCULAR | Status: DC | PRN
  Administered 2023-05-15: 10 mg via INTRAVENOUS

## 2023-05-15 MED ORDER — LACTATED RINGERS IV SOLN: INTRAVENOUS | Status: DC | PRN

## 2023-05-15 MED ORDER — OXYCODONE HCL 5 MG PO TABS
5.0000 mg | ORAL_TABLET | Freq: Once | ORAL | Status: AC | PRN
  Administered 2023-05-15: 5 mg via ORAL

## 2023-05-15 MED ORDER — MORPHINE SULFATE (PF) 2 MG/ML IV SOLN
2.0000 mg | INTRAVENOUS | Status: DC | PRN
  Administered 2023-05-16 – 2023-05-17 (×2): 2 mg via INTRAVENOUS
  Filled 2023-05-15 (×2): qty 1

## 2023-05-15 MED ORDER — ONDANSETRON HCL 4 MG/2ML IJ SOLN
INTRAMUSCULAR | Status: DC | PRN
  Administered 2023-05-15: 4 mg via INTRAVENOUS

## 2023-05-15 MED ORDER — CHLORHEXIDINE GLUCONATE 0.12 % MT SOLN
OROMUCOSAL | Status: AC
  Filled 2023-05-15: qty 15

## 2023-05-15 MED ORDER — OXYCODONE HCL 5 MG PO TABS
ORAL_TABLET | ORAL | Status: AC
  Filled 2023-05-15: qty 1

## 2023-05-15 MED ORDER — DIPHENHYDRAMINE HCL 50 MG/ML IJ SOLN: 12.5000 mg | Freq: Four times a day (QID) | INTRAMUSCULAR | Status: DC | PRN

## 2023-05-15 MED ORDER — DOCUSATE SODIUM 100 MG PO CAPS
100.0000 mg | ORAL_CAPSULE | Freq: Two times a day (BID) | ORAL | Status: DC
  Administered 2023-05-15 – 2023-05-18 (×6): 100 mg via ORAL
  Filled 2023-05-15 (×6): qty 1

## 2023-05-15 MED ORDER — ROCURONIUM BROMIDE 10 MG/ML (PF) SYRINGE
PREFILLED_SYRINGE | INTRAVENOUS | Status: AC
  Filled 2023-05-15: qty 10

## 2023-05-15 MED ORDER — LIDOCAINE HCL (PF) 2 % IJ SOLN
INTRAMUSCULAR | Status: AC
  Filled 2023-05-15: qty 5

## 2023-05-15 MED ORDER — BUPIVACAINE HCL (PF) 0.5 % IJ SOLN
INTRAMUSCULAR | Status: AC
  Filled 2023-05-15: qty 30

## 2023-05-15 MED ORDER — LIDOCAINE HCL (CARDIAC) PF 100 MG/5ML IV SOSY
PREFILLED_SYRINGE | INTRAVENOUS | Status: DC | PRN
  Administered 2023-05-15: 40 mg via INTRAVENOUS

## 2023-05-15 MED ORDER — ACETAMINOPHEN 10 MG/ML IV SOLN
INTRAVENOUS | Status: DC | PRN
  Administered 2023-05-15: 1000 mg via INTRAVENOUS

## 2023-05-15 MED ORDER — BUPIVACAINE LIPOSOME 1.3 % IJ SUSP
INTRAMUSCULAR | Status: AC
  Filled 2023-05-15: qty 20

## 2023-05-15 MED ORDER — CHLORHEXIDINE GLUCONATE 0.12 % MT SOLN
15.0000 mL | Freq: Once | OROMUCOSAL | Status: AC
  Administered 2023-05-15: 15 mL via OROMUCOSAL

## 2023-05-15 MED ORDER — OXYCODONE-ACETAMINOPHEN 5-325 MG PO TABS
1.0000 | ORAL_TABLET | ORAL | Status: DC | PRN
  Administered 2023-05-15 – 2023-05-16 (×2): 2 via ORAL
  Administered 2023-05-16 – 2023-05-17 (×3): 1 via ORAL
  Administered 2023-05-17 – 2023-05-18 (×4): 2 via ORAL
  Filled 2023-05-15: qty 2
  Filled 2023-05-15: qty 1
  Filled 2023-05-15: qty 2
  Filled 2023-05-15: qty 1
  Filled 2023-05-15 (×4): qty 2
  Filled 2023-05-15: qty 1

## 2023-05-15 MED ORDER — HEMOSTATIC AGENTS (NO CHARGE) OPTIME
TOPICAL | Status: DC | PRN
  Administered 2023-05-15: 1 via TOPICAL

## 2023-05-15 MED ORDER — ORAL CARE MOUTH RINSE: 15.0000 mL | Freq: Once | OROMUCOSAL | Status: AC

## 2023-05-15 MED ORDER — PROPOFOL 10 MG/ML IV BOLUS
INTRAVENOUS | Status: DC | PRN
  Administered 2023-05-15: 110 mg via INTRAVENOUS

## 2023-05-15 MED ORDER — EPHEDRINE SULFATE (PRESSORS) 50 MG/ML IJ SOLN
INTRAMUSCULAR | Status: DC | PRN
  Administered 2023-05-15 (×2): 10 mg via INTRAVENOUS

## 2023-05-15 MED ORDER — CEFAZOLIN SODIUM-DEXTROSE 1-4 GM/50ML-% IV SOLN
1.0000 g | Freq: Three times a day (TID) | INTRAVENOUS | Status: AC
  Administered 2023-05-15 (×2): 1 g via INTRAVENOUS
  Filled 2023-05-15 (×2): qty 50

## 2023-05-15 MED ORDER — FENTANYL CITRATE (PF) 100 MCG/2ML IJ SOLN
25.0000 ug | INTRAMUSCULAR | Status: DC | PRN
  Administered 2023-05-15 (×4): 25 ug via INTRAVENOUS

## 2023-05-15 MED ORDER — HEPARIN SODIUM (PORCINE) 5000 UNIT/ML IJ SOLN
5000.0000 [IU] | Freq: Three times a day (TID) | INTRAMUSCULAR | Status: DC
  Administered 2023-05-15 – 2023-05-17 (×5): 5000 [IU] via SUBCUTANEOUS
  Filled 2023-05-15 (×6): qty 1

## 2023-05-15 MED ORDER — PROPOFOL 10 MG/ML IV BOLUS
INTRAVENOUS | Status: AC
  Filled 2023-05-15: qty 20

## 2023-05-15 MED ORDER — FENTANYL CITRATE (PF) 100 MCG/2ML IJ SOLN
INTRAMUSCULAR | Status: DC | PRN
  Administered 2023-05-15 (×6): 50 ug via INTRAVENOUS

## 2023-05-15 MED ORDER — DEXAMETHASONE SODIUM PHOSPHATE 10 MG/ML IJ SOLN
INTRAMUSCULAR | Status: AC
  Filled 2023-05-15: qty 1

## 2023-05-15 MED ORDER — OXYCODONE HCL 5 MG/5ML PO SOLN: 5.0000 mg | Freq: Once | ORAL | Status: AC | PRN

## 2023-05-15 SURGICAL SUPPLY — 83 items
ADH SKN CLS APL DERMABOND .7 (GAUZE/BANDAGES/DRESSINGS) ×2
AGENT HMST KT MTR STRL THRMB (HEMOSTASIS) ×2
ANCHOR TIS RET SYS 1550ML (BAG) ×2 IMPLANT
APL ESCP 34 STRL LF DISP (HEMOSTASIS) ×2
APPLICATOR SURGIFLO ENDO (HEMOSTASIS) ×2 IMPLANT
BAG DRN LRG CPC RND TRDRP CNTR (MISCELLANEOUS) ×2
BAG SPEC RTRVL C1550 25.4 (BAG) ×2
BAG URO DRAIN 4000ML (MISCELLANEOUS) ×2 IMPLANT
BULB RESERV EVAC DRAIN JP 100C (MISCELLANEOUS) IMPLANT
CANNULA REDUCER 12-8 DVNC XI (CANNULA) ×2 IMPLANT
CATH FOL 3WAY LX 18X30 (CATHETERS) ×2 IMPLANT
CLIP LIGATING HEM O LOK PURPLE (MISCELLANEOUS) ×8 IMPLANT
COVER TIP SHEARS 8 DVNC (MISCELLANEOUS) ×2 IMPLANT
DERMABOND ADVANCED .7 DNX12 (GAUZE/BANDAGES/DRESSINGS) ×4 IMPLANT
DRAIN CHANNEL JP 19F (MISCELLANEOUS) IMPLANT
DRAPE 3/4 80X56 (DRAPES) ×2 IMPLANT
DRAPE ARM DVNC X/XI (DISPOSABLE) ×8 IMPLANT
DRAPE COLUMN DVNC XI (DISPOSABLE) ×2 IMPLANT
DRIVER NDL LRG 8 DVNC XI (INSTRUMENTS) ×2 IMPLANT
DRIVER NDLE LRG 8 DVNC XI (INSTRUMENTS) ×4 IMPLANT
DRSG TEGADERM 4X4.75 (GAUZE/BANDAGES/DRESSINGS) IMPLANT
ELECT REM PT RETURN 9FT ADLT (ELECTROSURGICAL) ×2
ELECTRODE REM PT RTRN 9FT ADLT (ELECTROSURGICAL) ×2 IMPLANT
FORCEPS BPLR 8 MD DVNC XI (FORCEP) ×2 IMPLANT
FORCEPS BPLR FENES DVNC XI (FORCEP) ×2 IMPLANT
GLOVE BIO SURGEON STRL SZ 6.5 (GLOVE) ×4 IMPLANT
GLOVE BIOGEL PI IND STRL 7.5 (GLOVE) ×2 IMPLANT
GOWN STRL REUS W/ TWL LRG LVL3 (GOWN DISPOSABLE) ×4 IMPLANT
GOWN STRL REUS W/ TWL XL LVL3 (GOWN DISPOSABLE) ×2 IMPLANT
GOWN STRL REUS W/TWL LRG LVL3 (GOWN DISPOSABLE) ×4
GOWN STRL REUS W/TWL XL LVL3 (GOWN DISPOSABLE) ×2
GRASPER SUT TROCAR 14GX15 (MISCELLANEOUS) ×2 IMPLANT
GRASPER TIP-UP FEN DVNC XI (INSTRUMENTS) ×2 IMPLANT
HEMOSTAT SURGICEL 2X14 (HEMOSTASIS) ×2 IMPLANT
HOLDER FOLEY CATH W/STRAP (MISCELLANEOUS) ×2 IMPLANT
IRRIGATION STRYKERFLOW (MISCELLANEOUS) ×2 IMPLANT
IRRIGATOR STRYKERFLOW (MISCELLANEOUS) ×2
KIT PINK PAD W/HEAD ARE REST (MISCELLANEOUS) ×2
KIT PINK PAD W/HEAD ARM REST (MISCELLANEOUS) ×2 IMPLANT
KIT TURNOVER KIT A (KITS) ×2 IMPLANT
KITTNER LAPARASCOPIC 5X40 (MISCELLANEOUS) IMPLANT
LABEL OR SOLS (LABEL) ×2 IMPLANT
LOOP VESSEL MAXI  1X406 RED (MISCELLANEOUS) ×2
LOOP VESSEL MAXI 1X406 RED (MISCELLANEOUS) ×2 IMPLANT
MANIFOLD NEPTUNE II (INSTRUMENTS) ×2 IMPLANT
NDL HYPO 22X1.5 SAFETY MO (MISCELLANEOUS) ×2 IMPLANT
NDL INSUFFLATION 14GA 120MM (NEEDLE) ×2 IMPLANT
NEEDLE HYPO 22X1.5 SAFETY MO (MISCELLANEOUS) ×2 IMPLANT
NEEDLE INSUFFLATION 14GA 120MM (NEEDLE) ×2 IMPLANT
NS IRRIG 500ML POUR BTL (IV SOLUTION) ×2 IMPLANT
OBTURATOR OPTICAL STND 8 DVNC (TROCAR) ×2
OBTURATOR OPTICALSTD 8 DVNC (TROCAR) ×2 IMPLANT
PACK LAP CHOLECYSTECTOMY (MISCELLANEOUS) ×2 IMPLANT
PENCIL SMOKE EVACUATOR (MISCELLANEOUS) ×2 IMPLANT
PLUG CATH AND CAP STER (CATHETERS) ×2 IMPLANT
RELOAD STAPLE 60 2.6 WHT THN (STAPLE) IMPLANT
RELOAD STAPLER WHITE 60MM (STAPLE) ×6 IMPLANT
SCISSORS MNPLR CVD DVNC XI (INSTRUMENTS) ×2 IMPLANT
SEAL UNIV 5-12 XI (MISCELLANEOUS) ×8 IMPLANT
SET CYSTO W/LG BORE CLAMP LF (SET/KITS/TRAYS/PACK) IMPLANT
SET TUBE SMOKE EVAC HIGH FLOW (TUBING) IMPLANT
SOL ELECTROSURG ANTI STICK (MISCELLANEOUS) ×2
SOLUTION ELECTROSURG ANTI STCK (MISCELLANEOUS) ×2 IMPLANT
SPONGE DRAIN TRACH 4X4 STRL 2S (GAUZE/BANDAGES/DRESSINGS) ×2 IMPLANT
SPONGE T-LAP 4X18 ~~LOC~~+RFID (SPONGE) ×2 IMPLANT
SPONGE VERSALON 4X4 4PLY (MISCELLANEOUS) ×2 IMPLANT
STAPLE ECHEON FLEX 60 POW ENDO (STAPLE) IMPLANT
STAPLER RELOAD WHITE 60MM (STAPLE) ×6
STAPLER SKIN PROX 35W (STAPLE) IMPLANT
STRAP SAFETY 5IN WIDE (MISCELLANEOUS) ×6 IMPLANT
SURGIFLO W/THROMBIN 8M KIT (HEMOSTASIS) ×2 IMPLANT
SUT DVC VLOC 90 3-0 CV23 UNDY (SUTURE) ×2 IMPLANT
SUT ETHILON 3-0 FS-10 30 BLK (SUTURE) ×2
SUT MNCRL AB 4-0 PS2 18 (SUTURE) ×4 IMPLANT
SUT PDS AB 1 TP1 96 (SUTURE) ×2 IMPLANT
SUT VIC AB 0 CT1 36 (SUTURE) ×2 IMPLANT
SUTURE EHLN 3-0 FS-10 30 BLK (SUTURE) IMPLANT
TAPE CLOTH 3X10 WHT NS LF (GAUZE/BANDAGES/DRESSINGS) ×4 IMPLANT
TRAP FLUID SMOKE EVACUATOR (MISCELLANEOUS) ×2 IMPLANT
TROCAR Z-THREAD FIOS 12X100MM (TROCAR) ×2 IMPLANT
TROCAR Z-THREAD FIOS 5X100MM (TROCAR) ×2 IMPLANT
WATER STERILE IRR 3000ML UROMA (IV SOLUTION) ×2 IMPLANT
WATER STERILE IRR 500ML POUR (IV SOLUTION) ×2 IMPLANT

## 2023-05-15 NOTE — Interval H&P Note (Signed)
History and Physical Interval Note:  05/15/2023 7:27 AM  Trevor [name]  has presented today for surgery, with the diagnosis of Left upper tract urothelial carcinoma.  The various methods of treatment have been discussed with the patient and family. After consideration of risks, benefits and other options for treatment, the patient has consented to  Procedure(s): XI ROBOT ASSITED LAPAROSCOPIC NEPHROURETERECTOMY (Left) BLADDER INSTILLATION OF GEMCITABINE (N/A) as a surgical intervention.  The patient's history has been reviewed, patient examined, no change in status, stable for surgery.  I have reviewed the patient's chart and labs.  Questions were answered to the patient's satisfaction.    RRR CTAB  [name]

## 2023-05-15 NOTE — Op Note (Signed)
05/15/23  PREOPERATIVE DIAGNOSES: Left upper tract urothelial carcinoma   POSTOPERATIVE DIAGNOSES: Same as above   PROCEDURE PERFORMED: Left robotic nephroureterectomy, instillation of intravesical gemcitabine    ATTENDING SURGEON: Claris Gladden, MD     Assistant: Dr. Legrand Rams   ANESTHESIA: General anesthesia.   ESTIMATED BLOOD LOSS: 50 mL.     DRAINS: JP drain, 18 French 3-way Foley catheter.     COMPLICATIONS: None.     SPECIMENS: Left kidney with ureter and bladder cuff.     INDICATION: This is a high-grade left upper tract urothelial carcinoma who presents today for definitive management. Risk and benefits of the procedure were explained in detail to the patient who agreed to proceed as planned.     PROCEDURE: The patient was correctly identified in the preoperative holding area and informed consent was confirmed. He was brought to the operating suite and placed on the table in the supine position. At this time, universal timeout protocol performed. All team members were identified. Venodyne boots were placed and she was administered 2 grams of IV Ancef in the perioperative period. An 32 French 3-way Foley catheter was then placed using standard sterile technique and hooked up to a CBI for planned use later in the procedure. He was then repositioned in the lateral decubitus position with the left flank up and all pressure points were carefully padded.  Given that she was not positioned on her foot axilla, no axillary roll was applied and she was secured to the table using straps, gel pads, and tape. He was then prepped and draped in standard surgical fashion. At this point in time, a Veress needle was used in the left lateral mid abdomen to insufflate the abdominal cavity. There was negative saline drop test without any bloody aspirate and he did have relatively low opening pressures of 8 mmHg. The abodmen insufflated nicely and it was well tolerated. At this point, all port  sites were carefully planned and mapped out across her left hemi-abdomen; Lipsoma marcaine was used in all of the laparoscopic port sites. The port sites were as follows: A 12 mm assistant port was placed in the midline just above the level of the umbilicus, an additional 12 mm midabdominal port site was placed and used for a camera port using a 12 mm trocar, an 8 mm robotic port was placed in the left upper quadrant in the mammary line just below the costal margin, 2 additional 8 mm robotic port sites were placed in the left lateral lower abdomen and left lower mid abdomen as used for additional robotic port sites. The initial trocar was placed using a 5 mm Visiport, which was later converted to an 8 mm robotic arm. There was absolutely no injury noted from the Veress needle or trocar sites. The robot was then brought in and docked using bipolar [place] in the left, scissors on the right with a tip up grasper in the 4th arm.    The white line of Toldt was incised and the left hemicolon was reflected medially. There was a nice plane here and Gerota fascia could be easily identified. The ureter and gonadal vein were then identified in the lower portion.  Immediately placed back across the ureter for cancer control.  The tail of Gerota was then traced superiorly towards the hilum. The hilum was then further dissected very carefully and precisely such that 1 single renal vein was identified as well as 1 single artery just posterior and slightly superior to  the vein. The dissection was then carried up inferiorly and a plane was created between the adrenal gland and the middle aspect of the upper pole of the kidney. The splenorenal ligament was then incised and the spleen was mobilized superiorly as well. A plane was then created posterior to the kidney, and the kidney was retracted superiorly to place the hilum on stretch. At this point in time, a 60 mm vascular stapler was used to first ligate the renal artery and  vein together en bloc.. There was no bleeding or complication associated with the stapling of the hilum. The kidney was then further mobilized and the upper pole was eventually able to be freed. The tail of Gerota's was then freed up until the kidney was completely free from its attachments other than by ureter.The ureter was then traced inferiorly as it crossed over the iliac vessels down into the deep pelvis.    At this point in time, I reposition the interpretation of camera ports as well as redocked the robot with orientation towards the pelvis.   Some small periureteral vessels were controlled and the dissection was carried all the way down to the bladder cuff. The detrusor was opened around the ureter that entered the trigone such that a bulge mucosa could be seen at the UVJ. At this point in time, a preplaced 4-0 Vicryl suture barbed was used on the right lateral aspect of the UVJ just lateral to where the planned transection of the ureter would occur as an anchor stitch. An additional Weck clip was applied at the very distal aspect of the ureter to avoid any tumor spillage. The ureter was then transected, taking a cuff of normal bladder mucosa with the margin. The bladder mucosa and detrusor were closed in a single running layer using that preplaced Vicryl suture; this appeared to be watertight at the was tied down. The bladder was emptied just prior to transection of the ureter. It was refilled with 300 mL of saline and no leak noted whatsoever.    I then returned back to the kidney and detached the superior lateral and additional attachments such that the entire kidney and ureter were freed. Two additional staple fires were used to free up the left upper medial aspect of the kidney. At this point in time, the kidney and ureter were placed in a large endoscopic bag, which was brought in using the 12 mm port incision near the umbilicus connecting that with the 12 mm port just superior to this. A 19  French round Blake drain was placed in the left lower lateral port site and placed within the dependent pelvis as a drain. The hilum was then reinspected as well as the renal fossa and down into the deeper pelvis. There was no active bleeding noted. Additional hemostasis was achieved using Surgicel near the hilum as well as Surgiflo.  The midline incision was then closed using a #1 looped PDS.  Subcutaneous tissues were closed using Vicryl to close the dead space.  All remaining trocars were removed under direct visualization. There was no bleeding noted.     There were no complications in this case. All needle counts and instrument counts as well as laps were correct at the end of the case.     2000 mg of intravesical gemcitabine was instilled into the foley and allowed to dwell for 1 hour in the PACU.     An assistant was required for this surgical procedure. The duties of the assistant included  but were not limited to suctioning, passing suture, camera manipulation, retraction. This procedure would not be able to be performed without an Geophysicist/field seismologist.

## 2023-05-15 NOTE — Anesthesia Postprocedure Evaluation (Signed)
Anesthesia Post Note  Patient: Trevor [name]  Procedure(s) Performed: XI ROBOT ASSITED LAPAROSCOPIC NEPHROURETERECTOMY (Left: Flank) BLADDER INSTILLATION OF GEMCITABINE  Patient location during evaluation: PACU Anesthesia Type: General Level of consciousness: awake and alert, oriented and patient cooperative Pain management: pain level controlled Vital Signs Assessment: post-procedure vital signs reviewed and stable Respiratory status: spontaneous breathing, nonlabored ventilation and respiratory function stable Cardiovascular status: blood pressure returned to baseline and stable Postop Assessment: adequate PO intake Anesthetic complications: no   No notable events documented.   Last Vitals:  Vitals:   05/15/23 1215 05/15/23 1230  BP: (!) 146/63 (!) 147/73  Pulse: 60 (!) 59  Resp: 16 12  Temp:  36.5 C  SpO2: 100% 100%    Last Pain:  Vitals:   05/15/23 1230  TempSrc:   PainSc: 3                  Reed Breech

## 2023-05-15 NOTE — Anesthesia Procedure Notes (Signed)
Procedure Name: Intubation Date/Time: 05/15/2023 7:36 AM  Performed by: Lily Lovings, CRNAPre-anesthesia Checklist: Patient identified, Patient being monitored, Timeout performed, Emergency Drugs available and Suction available Patient Re-evaluated:Patient Re-evaluated prior to induction Oxygen Delivery Method: Circle system utilized Preoxygenation: Pre-oxygenation with 100% oxygen Induction Type: IV induction Ventilation: Mask ventilation without difficulty Laryngoscope Size: 3 and McGraph Grade View: Grade I Tube type: Oral Tube size: 7.5 mm Number of attempts: 1 Airway Equipment and Method: Stylet Placement Confirmation: ETT inserted through vocal cords under direct vision, positive ETCO2 and breath sounds checked- equal and bilateral Secured at: 22 cm Tube secured with: Tape Dental Injury: Teeth and Oropharynx as per pre-operative assessment

## 2023-05-15 NOTE — Anesthesia Preprocedure Evaluation (Signed)
Anesthesia Evaluation  Patient identified by MRN, date of birth, ID band Patient awake    Reviewed: Allergy & Precautions, H&P , NPO status , Patient's Chart, lab work & pertinent test results  History of Anesthesia Complications Negative for: history of anesthetic complications  Airway Mallampati: III  TM Distance: <3 FB Neck ROM: limited    Dental  (+) Dental Advidsory Given, Poor Dentition   Pulmonary neg shortness of breath, sleep apnea , neg recent URI   Pulmonary exam normal        Cardiovascular Exercise Tolerance: Good hypertension, Pt. on medications (-) angina + CAD  (-) Past MI and (-) DOE + dysrhythmias (h/o NSVT in setting of urosepsis 10/23) + Valvular Problems/Murmurs MR and AS  Rhythm:Regular Rate:Normal  10/12/2022 1. Normal left ventricular systolic function with an EF of >55%  2. Mild concentric LVH 3. No regional wall motion abnormalities 4. Left ventricular diastolic Doppler parameters consistent with abnormal relaxation (G1DD). 5. Mild right ventricular enlargement mild biatrial enlargement 6. Trivial AR and PR 7. Mild MR and TR 8. Trivial aortic stenosis with a mean pressure gradient of 8.2 mmHg; AVA (VTI) = 1.3 cm 9. No pericardial effusion    Neuro/Psych neg Seizures PSYCHIATRIC DISORDERS Anxiety Depression    Cervical DDD  Neuromuscular disease (chronic back pain s/p fusions)    GI/Hepatic ,GERD  Medicated and Controlled,,(+) Cirrhosis  (seen on CT scan)      Splenomegaly   Endo/Other  negative endocrine ROS    Renal/GU Renal disease   Left Urothelial Mass, History of Bladder Cancer    Musculoskeletal  (+) Arthritis ,    Abdominal Normal abdominal exam  (+)   Peds  Hematology  (+) Blood dyscrasia, anemia anemia, thrombocytopenia   Anesthesia Other Findings Past Medical History: No date: Acne No date: Allergic rhinitis No date: Anemia No date: Anxiety No date: BPH (benign  prostatic hypertrophy) with urinary obstruction No date: Cancer [facility])     Comment:  bladder CA; remission since Oct 2014 No date: Carpal tunnel syndrome No date: Cervicalgia No date: Degenerative disc disease, cervical     Comment:  neck and back;  No date: Depression     Comment:  controlled;  No date: Gastritis No date: GERD (gastroesophageal reflux disease) No date: Hemorrhoids No date: History of malignant neoplasm of bladder No date: Hyperlipidemia No date: Hypertension     Comment:  controlled with medication;  No date: Hypospadias No date: Low back pain No date: Mitral regurgitation No date: Pre-diabetes No date: Sleep apnea No date: Urinary frequency  Past Surgical History: 09/11/2019: ANTERIOR LAT LUMBAR FUSION; N/A     Comment:  Procedure: PRONE XLIF L2-3;  Surgeon: Venetia Night, MD;  Location: [facility] ORS;  Service: Neurosurgery;              Laterality: N/A; No date: BLADDER SURGERY     Comment:  2011, 2013, 2015 07/31/2015: COLONOSCOPY WITH PROPOFOL; N/A     Comment:  Procedure: COLONOSCOPY WITH PROPOFOL;  Surgeon: Scot Jun, MD;  Location: [facility] ENDOSCOPY;  Service:               Endoscopy;  Laterality: N/A; No date: COLONOSCOPY, ESOPHAGOGASTRODUODENOSCOPY (EGD) AND ESOPHAGEAL  DILATION No date: FLEXIBLE SIGMOIDOSCOPY 06/20/2018: LUMBAR LAMINECTOMY/DECOMPRESSION MICRODISCECTOMY; N/A     Comment:  Procedure: LUMBAR LAMINECTOMY/DECOMPRESSION  MICRODISCECTOMY 1 LEVEL-L-2-3;  Surgeon: Venetia Night, MD;  Location: [facility] ORS;  Service: Neurosurgery;              Laterality: N/A; 09/11/2019: POSTERIOR LUMBAR FUSION 4 WITH HARDWARE REMOVAL; N/A     Comment:  Procedure: L2-S1 POSTERIOR FUSION, ABORTED;  Surgeon:               Venetia Night, MD;  Location: [facility] ORS;  Service:               Neurosurgery;  Laterality: N/A; No date: SEPTOPLASTY No date: TONSILLECTOMY  BMI    Body Mass Index: 28.54  kg/m      Reproductive/Obstetrics negative OB ROS                             Anesthesia Physical Anesthesia Plan  ASA: 3  Anesthesia Plan: General ETT   Post-op Pain Management: Minimal or no pain anticipated and Dilaudid IV   Induction: Intravenous  PONV Risk Score and Plan: 2 and Ondansetron, Dexamethasone and Treatment may vary due to age or medical condition  Airway Management Planned: Oral ETT  Additional Equipment:   Intra-op Plan:   Post-operative Plan: Extubation in OR  Informed Consent: I have reviewed the patients History and Physical, chart, labs and discussed the procedure including the risks, benefits and alternatives for the proposed anesthesia with the patient or authorized representative who has indicated his/her understanding and acceptance.     Dental Advisory Given  Plan Discussed with: Anesthesiologist, CRNA and Surgeon  Anesthesia Plan Comments: (Patient consented for risks of anesthesia including but not limited to:  - adverse reactions to medications - damage to eyes, teeth, lips or other oral mucosa - nerve damage due to positioning  - sore throat or hoarseness - Damage to heart, brain, nerves, lungs, other parts of body or loss of life  Patient voiced understanding.)        Anesthesia Quick Evaluation

## 2023-05-15 NOTE — Transfer of Care (Signed)
Immediate Anesthesia Transfer of Care Note  Patient: Trevor [name]  Procedure(s) Performed: XI ROBOT ASSITED LAPAROSCOPIC NEPHROURETERECTOMY (Left: Flank) BLADDER INSTILLATION OF GEMCITABINE  Patient Location: PACU  Anesthesia Type:General  Level of Consciousness: drowsy and patient cooperative  Airway & Oxygen Therapy: Patient Spontanous Breathing and Patient connected to nasal cannula oxygen  Post-op Assessment: Report given to RN and Patient moving all extremities X 4  Post vital signs: Reviewed and stable  Last Vitals:  Vitals Value Taken Time  BP 152/69 05/15/23 1133  Temp    Pulse 65 05/15/23 1137  Resp 18 05/15/23 1137  SpO2 99 % 05/15/23 1137  Vitals shown include unvalidated device data.  Last Pain:  Vitals:   05/15/23 0620  TempSrc: Temporal  PainSc: 0-No pain         Complications: No notable events documented.

## 2023-05-15 NOTE — Progress Notes (Signed)
Called into patient's room when he was requesting a bed change. Patient has blood on his gown and bed pad. Patient had rolled on jp drain and drain lost suction. Dressing around site soaked. Drained jp drain, 35ml, changed dressing around site. Noticed bloody drainage coming from site. Notified Careers adviser. No changes at this time.

## 2023-05-16 LAB — BASIC METABOLIC PANEL
Anion gap: 11 (ref 5–15)
BUN: 21 mg/dL (ref 8–23)
CO2: 21 mmol/L — ABNORMAL LOW (ref 22–32)
Calcium: 7.9 mg/dL — ABNORMAL LOW (ref 8.9–10.3)
Chloride: 102 mmol/L (ref 98–111)
Creatinine, Ser: 1.17 mg/dL (ref 0.61–1.24)
GFR, Estimated: >60 mL/min (ref >60)
Glucose, Bld: 136 mg/dL — ABNORMAL HIGH (ref 70–99)
Potassium: 4.1 mmol/L (ref 3.5–5.1)
Sodium: 134 mmol/L — ABNORMAL LOW (ref 135–145)

## 2023-05-16 LAB — CBC
HCT: 26.8 % — ABNORMAL LOW (ref 39.0–52.0)
Hemoglobin: 8.8 g/dL — ABNORMAL LOW (ref 13.0–17.0)
MCH: 28.9 pg (ref 26.0–34.0)
MCHC: 32.8 g/dL (ref 30.0–36.0)
MCV: 87.9 fL (ref 80.0–100.0)
Platelets: 100 10*3/uL — ABNORMAL LOW (ref 150–400)
RBC: 3.05 MIL/uL — ABNORMAL LOW (ref 4.22–5.81)
RDW: 13.7 % (ref 11.5–15.5)
WBC: 8.6 10*3/uL (ref 4.0–10.5)
nRBC: 0 % (ref 0.0–0.2)

## 2023-05-16 LAB — TYPE AND SCREEN: Unit division: 0

## 2023-05-16 LAB — PREPARE RBC (CROSSMATCH)

## 2023-05-16 LAB — BPAM RBC
Blood Product Expiration Date: 202406282359
Unit Type and Rh: 6200

## 2023-05-16 MED ORDER — CHLORHEXIDINE GLUCONATE CLOTH 2 % EX PADS
6.0000 | MEDICATED_PAD | Freq: Every day | CUTANEOUS | Status: DC
  Administered 2023-05-16 – 2023-05-17 (×2): 6 via TOPICAL

## 2023-05-16 NOTE — Plan of Care (Signed)
  Problem: Education: Goal: Knowledge of the prescribed therapeutic regimen will improve Outcome: Progressing   Problem: Clinical Measurements: Goal: Postoperative complications will be avoided or minimized Outcome: Progressing   Problem: Respiratory: Goal: Ability to achieve and maintain a regular respiratory rate will improve Outcome: Progressing

## 2023-05-16 NOTE — Progress Notes (Signed)
1 Day Post-Op Subjective: The patient is doing well.  No nausea or vomiting. Pain is adequately controlled.  Not yet out of bed.  No I-S at bedside.  Objective: Vital signs in last 24 hours: Temp:  [97 F (36.1 C)-98.5 F (36.9 C)] 98.5 F (36.9 C) (06/04 0732) Pulse Rate:  [56-70] 70 (06/04 0732) Resp:  [11-19] 18 (06/04 0732) BP: (123-155)/(60-74) 139/73 (06/04 0732) SpO2:  [94 %-100 %] 99 % (06/04 0732)  Intake/Output from previous day: 06/03 0701 - 06/04 0700 In: 2917.3 [P.O.:180; I.V.:2437.3; IV Piggyback:300] Out: 1380 [Urine:1150; Drains:190; Blood:40] Intake/Output this shift: Total I/O In: 599.9 [I.V.:599.9] Out: 100 [Drains:100]  Physical Exam:  General: Alert and oriented. GI: Soft, Nondistended. Incisions: Clean and dry.  Mild bruising.  JP with serosanguineous fluid. Urine: Clear, Foley in place Extremities: Nontender, no erythema, no edema.  Lab Results: Recent Labs    05/16/23 0411  HGB 8.8*  HCT 26.8*          Recent Labs    05/10/23 1500 05/16/23 0411  CREATININE 1.28* 1.17           Results for orders placed or performed during the hospital encounter of 05/15/23 (from the past 24 hour(s))  CBC     Status: Abnormal   Collection Time: 05/16/23  4:11 AM  Result Value Ref Range   WBC 8.6 4.0 - 10.5 K/uL   RBC 3.05 (L) 4.22 - 5.81 MIL/uL   Hemoglobin 8.8 (L) 13.0 - 17.0 g/dL   HCT 09.8 (L) 11.9 - 14.7 %   MCV 87.9 80.0 - 100.0 fL   MCH 28.9 26.0 - 34.0 pg   MCHC 32.8 30.0 - 36.0 g/dL   RDW 82.9 56.2 - 13.0 %   Platelets 100 (L) 150 - 400 K/uL   nRBC 0.0 0.0 - 0.2 %  Basic metabolic panel     Status: Abnormal   Collection Time: 05/16/23  4:11 AM  Result Value Ref Range   Sodium 134 (L) 135 - 145 mmol/L   Potassium 4.1 3.5 - 5.1 mmol/L   Chloride 102 98 - 111 mmol/L   CO2 21 (L) 22 - 32 mmol/L   Glucose, Bld 136 (H) 70 - 99 mg/dL   BUN 21 8 - 23 mg/dL   Creatinine, Ser 8.65 0.61 - 1.24 mg/dL   Calcium 7.9 (L) 8.9 - 10.3 mg/dL   GFR,  Estimated >78 >46 mL/min   Anion gap 11 5 - 15    Assessment/Plan: POD# 1 s/p left robotic nephrectoureterctomy  1) Ambulate, Incentive spirometry 2) Advance diet as tolerated 3) Transition to oral pain medication 4) maintain foley 5) consider drain removal pending discharge status today vs. tomorrow     LOS: 1 day   Trevor [name] 05/16/2023, 8:26 AM

## 2023-05-16 NOTE — Progress Notes (Signed)
Brief Urology Progress Note  I saw him at lunchtime today. Pain is well controlled and he's tolerating PO. He is concerned about d/c today since he lives alone and feels he needs another night with additional support postop. We discussed discharge goals of pain control, tolerating PO, and ambulating. Anticipate discharge tomorrow.  Carman Ching, PA-C 05/16/23  1:01 PM

## 2023-05-16 NOTE — TOC CM/SW Note (Signed)
Transition of Care [facility]) - Inpatient Brief Assessment   Patient Details  Name: Trevor [name] MRN: [id number] Date of Birth: May 02, 1946  Transition of Care [facility]) CM/SW Contact:    Chapman Fitch, RN Phone Number: 05/16/2023, 2:39 PM   Clinical Narrative:    Transition of Care Asessment: Insurance and Status: Insurance coverage has been reviewed Patient has primary care physician: Yes     Prior/Current Home Services: No current home services Social Determinants of Health Reivew: SDOH reviewed no interventions necessary Readmission risk has been reviewed: Yes Transition of care needs: no transition of care needs at this time

## 2023-05-17 LAB — BASIC METABOLIC PANEL
Anion gap: 9 (ref 5–15)
BUN: 14 mg/dL (ref 8–23)
CO2: 21 mmol/L — ABNORMAL LOW (ref 22–32)
Calcium: 8 mg/dL — ABNORMAL LOW (ref 8.9–10.3)
Chloride: 104 mmol/L (ref 98–111)
Creatinine, Ser: 0.97 mg/dL (ref 0.61–1.24)
GFR, Estimated: >60 mL/min (ref >60)
Glucose, Bld: 128 mg/dL — ABNORMAL HIGH (ref 70–99)
Potassium: 4 mmol/L (ref 3.5–5.1)
Sodium: 134 mmol/L — ABNORMAL LOW (ref 135–145)

## 2023-05-17 LAB — CBC
HCT: 29.9 % — ABNORMAL LOW (ref 39.0–52.0)
Hemoglobin: 9.4 g/dL — ABNORMAL LOW (ref 13.0–17.0)
MCH: 28.3 pg (ref 26.0–34.0)
MCHC: 31.4 g/dL (ref 30.0–36.0)
MCV: 90.1 fL (ref 80.0–100.0)
Platelets: 69 10*3/uL — ABNORMAL LOW (ref 150–400)
RBC: 3.32 MIL/uL — ABNORMAL LOW (ref 4.22–5.81)
RDW: 13.5 % (ref 11.5–15.5)
WBC: 5.6 10*3/uL (ref 4.0–10.5)
nRBC: 0 % (ref 0.0–0.2)

## 2023-05-17 LAB — CREATININE, FLUID (PLEURAL, PERITONEAL, JP DRAINAGE): Creat, Fluid: 1 mg/dL

## 2023-05-17 MED ORDER — FINASTERIDE 5 MG PO TABS
5.0000 mg | ORAL_TABLET | Freq: Every day | ORAL | Status: DC
  Administered 2023-05-18: 5 mg via ORAL
  Filled 2023-05-17: qty 1

## 2023-05-17 MED ORDER — TAMSULOSIN HCL 0.4 MG PO CAPS
0.4000 mg | ORAL_CAPSULE | Freq: Every day | ORAL | Status: DC
  Administered 2023-05-18: 0.4 mg via ORAL
  Filled 2023-05-17: qty 1

## 2023-05-17 MED ORDER — CARVEDILOL 6.25 MG PO TABS
3.1250 mg | ORAL_TABLET | Freq: Two times a day (BID) | ORAL | Status: DC
  Administered 2023-05-17 – 2023-05-18 (×2): 3.125 mg via ORAL
  Filled 2023-05-17 (×2): qty 1

## 2023-05-17 MED ORDER — LEVOTHYROXINE SODIUM 50 MCG PO TABS
25.0000 ug | ORAL_TABLET | Freq: Every day | ORAL | Status: DC
  Administered 2023-05-18: 25 ug via ORAL
  Filled 2023-05-17: qty 1

## 2023-05-17 MED ORDER — SIMVASTATIN 20 MG PO TABS
40.0000 mg | ORAL_TABLET | Freq: Every day | ORAL | Status: DC
  Administered 2023-05-17: 40 mg via ORAL
  Filled 2023-05-17: qty 2

## 2023-05-17 MED ORDER — LISINOPRIL 10 MG PO TABS
10.0000 mg | ORAL_TABLET | Freq: Every day | ORAL | Status: DC
  Administered 2023-05-17 – 2023-05-18 (×2): 10 mg via ORAL
  Filled 2023-05-17 (×2): qty 1

## 2023-05-17 MED ORDER — LISINOPRIL-HYDROCHLOROTHIAZIDE 20-25 MG PO TABS: 0.5000 | ORAL_TABLET | ORAL | Status: DC

## 2023-05-17 MED ORDER — PANTOPRAZOLE SODIUM 40 MG PO TBEC
40.0000 mg | DELAYED_RELEASE_TABLET | Freq: Every day | ORAL | Status: DC
  Administered 2023-05-18: 40 mg via ORAL
  Filled 2023-05-17: qty 1

## 2023-05-17 MED ORDER — HYDROCHLOROTHIAZIDE 12.5 MG PO TABS
12.5000 mg | ORAL_TABLET | Freq: Every day | ORAL | Status: DC
  Administered 2023-05-17 – 2023-05-18 (×2): 12.5 mg via ORAL
  Filled 2023-05-17 (×2): qty 1

## 2023-05-17 MED ORDER — TRAZODONE HCL 100 MG PO TABS
100.0000 mg | ORAL_TABLET | Freq: Every day | ORAL | Status: DC
  Administered 2023-05-17: 100 mg via ORAL
  Filled 2023-05-17: qty 1

## 2023-05-17 NOTE — Progress Notes (Signed)
Urology Consult Follow Up  Subjective: POD # 2   robotic left nephroureterectomy   He is having some left lower quadrant discomfort that is responsive to pain medication.  He is passing flatus.  He is ambulating around the room without difficulty.   He is tolerating regular diet.  VSS afebrile  JP drainage  590 cc overnight of serosanguineous fluid.   Good UOP with very light pink urine.  Serum creatinine this a.m. is 0.97 with a EGFR greater than 60.   CBC demonstrates stable hemoglobin hematocrit.   Anti-infectives: Anti-infectives (From admission, onward)    Start     Dose/Rate Route Frequency Ordered Stop   05/15/23 1530  ceFAZolin (ANCEF) IVPB 1 g/50 mL premix        1 g 100 mL/hr over 30 Minutes Intravenous Every 8 hours 05/15/23 1441 05/16/23 0700   05/15/23 0130  ceFAZolin (ANCEF) IVPB 2g/100 mL premix        2 g 200 mL/hr over 30 Minutes Intravenous  Once 05/15/23 0125 05/15/23 1478       Current Facility-Administered Medications  Medication Dose Route Frequency Provider Last Rate Last Admin   0.9 %  sodium chloride infusion   Intravenous Continuous [name], MD 125 mL/hr at 05/16/23 1642 New Bag at 05/16/23 1642   acetaminophen (TYLENOL) tablet 650 mg  650 mg Oral Q4H PRN [name], MD       Chlorhexidine Gluconate Cloth 2 % PADS 6 each  6 each Topical Daily [name], MD   6 each at 05/17/23 1022   diphenhydrAMINE (BENADRYL) injection 12.5 mg  12.5 mg Intravenous Q6H PRN [name], MD       Or   diphenhydrAMINE (BENADRYL) 12.5 MG/5ML elixir 12.5 mg  12.5 mg Oral Q6H PRN [name], MD       docusate sodium (COLACE) capsule 100 mg  100 mg Oral BID [name], MD   100 mg at 05/17/23 1022   heparin injection 5,000 Units  5,000 Units Subcutaneous Q8H [name], MD   5,000 Units at 05/17/23 0511   morphine (PF) 2 MG/ML injection 2-4 mg  2-4 mg Intravenous Q2H PRN [name], MD   2 mg at 05/17/23 1022   ondansetron (ZOFRAN)  injection 4 mg  4 mg Intravenous Q4H PRN [name], MD       oxybutynin (DITROPAN) tablet 5 mg  5 mg Oral Q8H PRN [name], MD       oxyCODONE-acetaminophen (PERCOCET/ROXICET) 5-325 MG per tablet 1-2 tablet  1-2 tablet Oral Q4H PRN [name], MD   1 tablet at 05/17/23 0726     Objective: Vital signs in last 24 hours: Temp:  [98.2 F (36.8 C)-98.8 F (37.1 C)] 98.2 F (36.8 C) (06/05 0726) Pulse Rate:  [75-80] 77 (06/05 0900) Resp:  [17-18] 17 (06/05 0900) BP: (153-176)/(78-81) 176/78 (06/05 0900) SpO2:  [99 %] 99 % (06/05 0900)  Intake/Output from previous day: 06/04 0701 - 06/05 0700 In: 1533.8 [P.O.:430; I.V.:1103.8] Out: 2690 [Urine:2100; Drains:590] Intake/Output this shift: Total I/O In: 240 [P.O.:240] Out: 1120 [Urine:900; Drains:220]   Physical Exam Constitutional:  Well nourished. Alert and oriented, No acute distress. HEENT: [place] AT, moist mucus membranes.  Trachea midline Cardiovascular: No clubbing, cyanosis, or edema. Respiratory: Normal respiratory effort, no increased work of breathing. GU: No CVA tenderness.  Surgical incisions are clean and dry.  No erythema noted.  Abdomen is slightly tender with no guarding, no bladder fullness or masses.  JP drain  in place in the left lower quadrant. Patient with uncircumcised phallus. Foreskin easily retracted.   3-way Foley in place draining very light pink clear urine.  Scrotum without lesions, cysts, rashes and/or edema.  Neurologic: Grossly intact, no focal deficits, moving all 4 extremities. Psychiatric: Normal mood and affect.   Lab Results:  Recent Labs    05/16/23 0411 05/17/23 0940  WBC 8.6 5.6  HGB 8.8* 9.4*  HCT 26.8* 29.9*  PLT 100* 69*   BMET Recent Labs    05/16/23 0411 05/17/23 0940  NA 134* 134*  K 4.1 4.0  CL 102 104  CO2 21* 21*  GLUCOSE 136* 128*  BUN 21 14  CREATININE 1.17 0.97  CALCIUM 7.9* 8.0*   PT/INR No results for input(s): "LABPROT", "INR" in the last 72  hours. ABG No results for input(s): "PHART", "HCO3" in the last 72 hours.  Invalid input(s): "PCO2", "PO2"  Studies/Results: No results found.   Assessment and Plan: 77 year old male with a history of diabetes, bladder cancer and cirrhosis who underwent a left robotic nephro ureterectomy for high-grade urothelial carcinoma on May 15, 2023 with Dr. Apolinar Junes  He had significant JP drainage overnight.  Serum creatinine has actually improved from yesterday's labs.  CBC demonstrates stable hemoglobin hematocrit.  Drain creatinine has been ordered and is still pending.  Will likely need to stay another night to continue monitoring JP drainage and await drain creatinine results.  -Continue to encourage ambulation and incentive spirometry -Continue diet as tolerated -Transition to oral pain medication -Maintain Foley   LOS: 2 days    [facility] 05/17/2023

## 2023-05-17 NOTE — Discharge Instructions (Signed)
Activity:  You are encouraged to ambulate frequently (about every hour during waking hours) to help prevent blood clots from forming in your legs or lungs.  However, you should not engage in any heavy lifting (> 5-10 lbs), strenuous activity, or straining.  Diet: You should advance your diet as instructed by your physician.  It will be normal to have some bloating, nausea, and abdominal discomfort intermittently.  Prescriptions:  You will be provided a prescription for pain medication to take as needed.  If your pain is not severe enough to require the prescription pain medication, you may take extra strength Tylenol instead which will have less side effects.  You should also take a prescribed stool softener to avoid straining with bowel movements as the prescription pain medication may constipate you.  Incisions: You may remove your dressing bandages 48 hours after surgery if not removed in the hospital.  You will either have some small staples or special tissue glue at each of the incision sites. Once the bandages are removed (if present), the incisions may stay open to air.  You may start showering (but not soaking or bathing in water) the 2nd day after surgery and the incisions simply need to be patted dry after the shower.  No additional care is needed.  What to call us about: You should call the office if you develop fever > 101 or develop persistent vomiting, redness or draining around your incision, or any other concerning symptoms.    [facility] [address] [place], [place] [address] [phone]

## 2023-05-17 NOTE — Discharge Summary (Signed)
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  Discharge diagnosis: Same as above  Secondary diagnoses:  Patient Active Problem List   Diagnosis Date Noted   Urothelial carcinoma of kidney, left (HCC) 05/15/2023   Sepsis secondary to UTI (HCC) 09/22/2022   Hypokalemia 09/22/2022   Depression    Gastritis    AKI (acute kidney injury) (HCC) 01/10/2022   Dizziness 01/10/2022   S/P lumbar fusion 09/11/2019   Bacteremia 07/13/2018   Herpes stomatitis 07/12/2018   Postoperative wound infection 07/12/2018   Lumbar surgical wound fluid collection 07/12/2018   Wound dehiscence, surgical    Postoperative pain    Neurogenic bowel    Thrombocytopenia (HCC)    Acute blood loss anemia    Sleep disturbance    Essential hypertension    Steroid-induced hyperglycemia    Prediabetes    Leukocytosis    Herniation of lumbar intervertebral disc with radiculopathy 06/22/2018   Lumbar radiculopathy 06/20/2018   Nocturia 10/23/2015   Absolute anemia 05/13/2014   Benign essential HTN 05/13/2014   Blood glucose elevated 05/13/2014   LBP (low back pain) 05/13/2014   HLD (hyperlipidemia) 05/13/2014   Malignant neoplasm of lateral wall of urinary bladder (HCC) 09/19/2013   Benign prostatic hypertrophy without urinary obstruction 12/10/2012   Hypospadias 12/10/2012   Microscopic hematuria 12/10/2012   History of neoplasm of bladder 12/10/2012   FOM (frequency of micturition) 12/10/2012   History and Physical: For full details, please see admission history and physical. Briefly, Trevor [name] is a 77 y.o. year old patient with left upper tract urothelial carcinoma that was identified during a bilateral retrograde pyelogram which noted a filling defect in the left kidney.  He underwent a second look left ureteroscopy and pathology was consistent with high-grade urothelial carcinoma.  He underwent a scheduled left robotic nephro  ureterectomy with Dr. Apolinar Junes on May 15, 2023 for definitive management.  Surgical pathology is still pending at the time of this note.  This morning his pain remains well controlled. He continues to tolerate PO and has been ambulatory. He is passing flatus; no BMs yet. Foley in place draining clear, yellow urine. LLQ JP drain in place draining serosanguinous fluid.  Drain creatinine 1.0; serum creatinine 0.97. drain output yesterday.  Physical Exam: Constitutional:  Alert and oriented, no acute distress, nontoxic appearing HEENT: [place], AT Cardiovascular: No clubbing, cyanosis, or edema Respiratory: Normal respiratory effort, no increased work of breathing GI: Abdomen is soft, no rigidity or rebound. Surgical incisions all clean, dry, and intact. Skin: No rashes, bruises or suspicious lesions Neurologic: Grossly intact, no focal deficits, moving all 4 extremities Psychiatric: Normal mood and affect   Hospital Course: Patient tolerated the procedure well.  He was then transferred to the floor after an uneventful PACU stay.  His hospital course was uncomplicated.  On POD#3 he had met discharge criteria: was eating a regular diet, was up and ambulating independently,  catheter was draining well, pain was well controlled, and was ready for discharge.  JP drain removed prior to discharge.  Laboratory values:  Recent Labs    05/16/23 0411 05/17/23 0940  WBC 8.6 5.6  HGB 8.8* 9.4*  HCT 26.8* 29.9*   Recent Labs    05/16/23 0411 05/17/23 0940  NA 134* 134*  K 4.1 4.0  CL 102 104  CO2 21* 21*  GLUCOSE 136* 128*  BUN 21 14  CREATININE 1.17 0.97  CALCIUM 7.9* 8.0*  Results for orders placed or performed during the hospital encounter of 05/10/23  Urine Culture     Status: None   Collection Time: 05/10/23  3:00 PM   Specimen: Urine, Clean Catch  Result Value Ref Range Status   Specimen Description   Final    URINE, CLEAN CATCH Performed at [facility], [address] [place], [place] [address]    Special Requests   Final    NONE Performed at [facility], [address] [place], [place] [address]    Culture   Final    NO GROWTH Performed at [facility], [address] [place], [place] [address]    Report Status 05/11/2023 FINAL  Final   Disposition: Home  Discharge instruction: The patient was instructed to be ambulatory but told to refrain from heavy lifting, strenuous activity, or driving. Foley care instructions provided by nursing.  Discharge medications:  Allergies as of 05/18/2023       Reactions   Ceftriaxone Hives   Tolerated 1st generation cephalosporin (CEFAZOLIN) on 06/20/2018 and 09/11/2019 without documented ADRs. Previously tolerated amoxicillin Blisters and Hives around mouth and neck.  Some blistering of lips.  Mouth not effected.  Patient started on ceftriaxone and doxycyline 1-2 days prior to reaction.     Doxycycline Hives   Blisters and Hives around mouth and neck.  Some blistering of lips.  Mouth not effected. Patient started on ceftriaxone and doxycyline 1-2 days prior to reaction.     Macrobid [nitrofurantoin] Rash        Medication List     TAKE these medications    aspirin EC 81 MG tablet Take 81 mg by mouth daily.   carvedilol 3.125 MG tablet Commonly known as: COREG Take 1 tablet (3.125 mg total) by mouth 2 (two) times daily with a meal.   cyanocobalamin 500 MCG tablet Commonly known as: VITAMIN B12 Take 500 mcg by mouth daily.   docusate sodium 100 MG capsule Commonly known as: COLACE Take 1 capsule (100 mg total) by mouth 2 (two) times daily.   finasteride 5 MG tablet Commonly known as: PROSCAR Take 1 tablet (5 mg total) by mouth daily. What changed: when to take this   fluticasone 50 MCG/ACT nasal spray Commonly known as: FLONASE Place 2 sprays into both nostrils daily as needed for allergies.   IRON PO Take 1 tablet by mouth daily.   levothyroxine 25 MCG  tablet Commonly known as: SYNTHROID Take 25 mcg by mouth daily before breakfast.   lisinopril-hydrochlorothiazide 20-25 MG tablet Commonly known as: ZESTORETIC Take 0.5 tablets by mouth every morning.   multivitamin with minerals Tabs tablet Take 1 tablet by mouth daily.   Omeprazole 20 MG Tbec Take 20 mg by mouth every morning.   OVER THE COUNTER MEDICATION Take 2 tablets by mouth daily. Arthritis/Joint supplement, Omega XL   oxybutynin 5 MG tablet Commonly known as: DITROPAN Take 1 tablet (5 mg total) by mouth every 8 (eight) hours as needed for bladder spasms.   oxyCODONE-acetaminophen 5-325 MG tablet Commonly known as: PERCOCET/ROXICET Take 1-2 tablets by mouth every 6 (six) hours as needed for moderate pain.   senna-docusate 8.6-50 MG tablet Commonly known as: Senokot-S Take 2 tablets by mouth at bedtime as needed for mild constipation.   simvastatin 40 MG tablet Commonly known as: ZOCOR Take 40 mg by mouth every morning.   tamsulosin 0.4 MG Caps capsule Commonly known as: FLOMAX Take 1 capsule (0.4 mg total) by  mouth daily. What changed: when to take this   traZODone 100 MG tablet Commonly known as: DESYREL Take 100 mg by mouth at bedtime.   VITAMIN D PO Take 1 tablet by mouth daily at 2 PM.        Followup:   Follow-up Information     Carman Ching, [place]. Go on 05/22/2023.   Specialty: Urology Why: For catheter removal Contact information: [address] [place] [address] [phone]

## 2023-05-18 DIAGNOSIS — C652 Malignant neoplasm of left renal pelvis: Secondary | ICD-10-CM

## 2023-05-18 MED ORDER — OXYCODONE-ACETAMINOPHEN 5-325 MG PO TABS: 1.0000 | ORAL_TABLET | Freq: Four times a day (QID) | ORAL | 0 refills | Status: DC | PRN

## 2023-05-18 MED ORDER — DOCUSATE SODIUM 100 MG PO CAPS: 100.0000 mg | ORAL_CAPSULE | Freq: Two times a day (BID) | ORAL | 0 refills | Status: AC

## 2023-05-18 MED ORDER — OXYBUTYNIN CHLORIDE 5 MG PO TABS: 5.0000 mg | ORAL_TABLET | Freq: Three times a day (TID) | ORAL | 0 refills | Status: DC | PRN

## 2023-05-19 ENCOUNTER — Other Ambulatory Visit: Payer: Self-pay | Admitting: Physician Assistant

## 2023-05-19 LAB — SURGICAL PATHOLOGY

## 2023-05-19 NOTE — Telephone Encounter (Signed)
Spoke with pharmacist and pt picked up rx on 05/18/23 for #14 tablets and he called in a refill today. Tried calling pt had to leave VM, refill denied too soon and he should only take as needed.

## 2023-05-19 NOTE — Telephone Encounter (Signed)
Refill inappropriate without further information. If he is having such severe postoperative pain that he's needed 14 percocet in 24 hours, then we need to assess him or he needs to go to the ED over the weekend.

## 2023-05-22 ENCOUNTER — Ambulatory Visit (INDEPENDENT_AMBULATORY_CARE_PROVIDER_SITE_OTHER): Payer: Medicare Other | Admitting: Physician Assistant

## 2023-05-22 ENCOUNTER — Encounter: Payer: Self-pay | Admitting: Physician Assistant

## 2023-05-22 VITALS — BP 121/73 | HR 89 | Ht 67.0 in | Wt 175.0 lb

## 2023-05-22 DIAGNOSIS — C642 Malignant neoplasm of left kidney, except renal pelvis: Secondary | ICD-10-CM

## 2023-05-22 MED ORDER — OXYCODONE-ACETAMINOPHEN 5-325 MG PO TABS
1.0000 | ORAL_TABLET | Freq: Four times a day (QID) | ORAL | 0 refills | Status: DC | PRN
Start: 2023-05-22 — End: 2023-06-27

## 2023-05-22 MED ORDER — LEVOFLOXACIN 500 MG PO TABS
500.0000 mg | ORAL_TABLET | Freq: Once | ORAL | Status: AC
Start: 2023-05-22 — End: 2023-05-22
  Administered 2023-05-22: 500 mg via ORAL

## 2023-05-22 NOTE — Progress Notes (Signed)
Catheter Removal  Patient is present today for a catheter removal.  8ml of water was drained from the balloon. A 18FR three-way foley cath was removed from the bladder, no complications were noted. Patient tolerated well.  Performed by: Carman Ching, PA-C   Additional notes: 1 dose Levaquin 500mg  administered prior to Foley removal. Wounds clean, dry, and intact. Abdomen soft without rigidity, rebound, or guarding.   No nausea/vomiting/fevers, tolerating PO, having BMs, no leg swelling or pain/chest pain/shortness of breath. He does have some bilateral pitting edema on the dorsa of the feet and admits to limited ambulation. I encouraged ambulation and foot elevation to clear this.  I removed the bandage from the site of his postop drain and there was a stitch fragment still present; I removed this without difficulty. The site had some minor oozing so I cleaned it with betadine and replaced sterile gauze and Tegaderm dressing.  We discussed transitioning to Tylenol for pain relief and I counseled him to avoid NSAIDs given solitary kidney.  Follow up: Return for Postop f/u as scheduled.

## 2023-06-27 ENCOUNTER — Ambulatory Visit (INDEPENDENT_AMBULATORY_CARE_PROVIDER_SITE_OTHER): Payer: Medicare Other | Admitting: Urology

## 2023-06-27 VITALS — BP 129/72 | HR 73 | Ht 67.0 in | Wt 167.2 lb

## 2023-06-27 DIAGNOSIS — Z8551 Personal history of malignant neoplasm of bladder: Secondary | ICD-10-CM

## 2023-06-27 DIAGNOSIS — Z905 Acquired absence of kidney: Secondary | ICD-10-CM

## 2023-06-27 DIAGNOSIS — Z08 Encounter for follow-up examination after completed treatment for malignant neoplasm: Secondary | ICD-10-CM

## 2023-06-27 DIAGNOSIS — Z8550 Personal history of malignant neoplasm of unspecified urinary tract organ: Secondary | ICD-10-CM

## 2023-06-27 NOTE — Progress Notes (Signed)
I,Amy L Pierron,acting as a scribe for [name], MD.,have documented all relevant documentation on the behalf of [name], MD,as directed by  [name], MD while in the presence of [name], MD.  06/27/2023 11:30 AM   Trevor [name] [birth year] [id number]  Referring provider: Kandyce Rud, MD [phone] [address] [facility] [place],  [place] [address]  Chief Complaint  Patient presents with   Wound Check    HPI: 77 year-old male with a personal history of bladder cancer and left upper tract urothelial cancer returns today for a follow-up.  On 05/15/2023 he underwent left nephroureterectomy. He declined the neoadjuvant chemotherapy. 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 All margins were negative. PTANXMX. Creatinine at the time of discharge was normal at 0.98.  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 His last cystoscopy was at the time of diagnostic ureteroscopy in April of 2024.  He has a personal history of BPH and is managed on Finasteride and Flomax.  He mentioned the only problem he had after surgery was some shoulder pain which was related to the release air/gases from surgery. Otherwise, he is doing well and is regaining his strength. He has lost about 10-15 pounds. He denies any urinary symptoms, no hematuria.  His next check up with labs is in September.    PMH: Past Medical History:  Diagnosis Date   Acne    AKI (acute kidney injury) (HCC)    Allergic rhinitis    Anemia    Anxiety    Aortic stenosis    a.) TTE 10/12/2022: mild AS (MPG 8.2); AVA (VTI) = 1.3 cm2   Bladder cancer (HCC)    a.) remission since Oct 2014   BPH (benign prostatic hypertrophy) with urinary obstruction    CAD  (coronary artery disease)    Carpal tunnel syndrome    Cervicalgia    Chronic calcific pancreatitis (HCC)    Chronic low back pain    Cirrhosis (HCC)    Degenerative disc disease, cervical    Depression    Diastolic dysfunction    a.) TTE 10/12/2022: EF >55%, mild LVH, mild BAE, mild RVE, triv AR/PR, mild MR/TR, G1DD   Gastritis    GERD (gastroesophageal reflux disease)    Heart murmur    Hemorrhoids    History of malignant neoplasm of bladder    Hyperlipidemia    Hypertension    Hypokalemia    Hypospadias    Lumbar spondylosis    a.) s/p L2-4 PLIF with L4-5 and L5-S1 interbody fusion   Non-sustained ventricular tachycardia (HCC) 09/2022   a.) 8 beat episode of NSVT during admission in setting urosepsis   Pre-diabetes    Sepsis (HCC)    Sleep apnea    a.) does not utilize nocturnal PAP therapy   Sleep difficulties    a.) on trazodone PRN   Splenomegaly    Thrombocytopenia (HCC)    Urinary frequency     Surgical History: Past Surgical History:  Procedure Laterality Date   ADENOIDECTOMY     ANTERIOR FUSION LUMBAR SPINE  11/20/2017   multiple levels; Dr. Myer Haff   ANTERIOR LAT LUMBAR FUSION N/A 09/11/2019   Procedure: PRONE XLIF L2-3;  Surgeon: Venetia Night, MD;  Location: [facility];  Service: Neurosurgery;  Laterality: N/A;  BIOPSY Left 03/13/2023   Procedure: BIOPSY;  Surgeon: [name], MD;  Location: [facility];  Service: Urology;  Laterality: Left;   BLADDER INSTILLATION N/A 05/15/2023   Procedure: BLADDER INSTILLATION OF GEMCITABINE;  Surgeon: [name], MD;  Location: [facility];  Service: Urology;  Laterality: N/A;   BLADDER SURGERY     2011, 2013, 2015   COLONOSCOPY WITH PROPOFOL N/A 07/31/2015   Procedure: COLONOSCOPY WITH PROPOFOL;  Surgeon: Scot Jun, MD;  Location: [facility];  Service: Endoscopy;  Laterality: N/A;   COLONOSCOPY, ESOPHAGOGASTRODUODENOSCOPY (EGD) AND ESOPHAGEAL DILATION  2006   CYSTOSCOPY W/ RETROGRADES Bilateral  11/11/2021   Procedure: CYSTOSCOPY WITH RETROGRADE PYELOGRAM;  Surgeon: [name], MD;  Location: [facility];  Service: Urology;  Laterality: Bilateral;   CYSTOSCOPY W/ RETROGRADES Bilateral 03/13/2023   Procedure: CYSTOSCOPY WITH RETROGRADE PYELOGRAM;  Surgeon: [name], MD;  Location: [facility];  Service: Urology;  Laterality: Bilateral;   CYSTOSCOPY W/ RETROGRADES Left 04/10/2023   Procedure: CYSTOSCOPY WITH RETROGRADE PYELOGRAM;  Surgeon: [name], MD;  Location: [facility];  Service: Urology;  Laterality: Left;   CYSTOSCOPY W/ URETERAL STENT PLACEMENT Left 04/10/2023   Procedure: CYSTOSCOPY WITH STENT EXCHANGE;  Surgeon: [name], MD;  Location: [facility];  Service: Urology;  Laterality: Left;   CYSTOSCOPY WITH BIOPSY N/A 11/11/2021   Procedure: CYSTOSCOPY WITH BLADDER BIOPSY;  Surgeon: [name], MD;  Location: [facility];  Service: Urology;  Laterality: N/A;   CYSTOSCOPY WITH BIOPSY N/A 03/13/2023   Procedure: CYSTOSCOPY WITH BLADDER BIOPSY;  Surgeon: [name], MD;  Location: [facility];  Service: Urology;  Laterality: N/A;   FLEXIBLE SIGMOIDOSCOPY  1994   LUMBAR LAMINECTOMY/DECOMPRESSION MICRODISCECTOMY N/A 06/20/2018   Procedure: LUMBAR LAMINECTOMY/DECOMPRESSION MICRODISCECTOMY 1 LEVEL-L-2-3;  Surgeon: Venetia Night, MD;  Location: [facility];  Service: Neurosurgery;  Laterality: N/A;   POSTERIOR FUSION LUMBAR SPINE  10/02/2019   L2-4   POSTERIOR LUMBAR FUSION 4 WITH HARDWARE REMOVAL N/A 09/11/2019   Procedure: L2-S1 POSTERIOR FUSION, ABORTED;  Surgeon: Venetia Night, MD;  Location: [facility];  Service: Neurosurgery;  Laterality: N/A;   ROBOT ASSITED LAPAROSCOPIC NEPHROURETERECTOMY Left 05/15/2023   Procedure: XI ROBOT ASSITED LAPAROSCOPIC NEPHROURETERECTOMY;  Surgeon: [name], MD;  Location: [facility];  Service: Urology;  Laterality: Left;   SEPTOPLASTY  1996   TONSILLECTOMY     URETERAL BIOPSY Left 04/10/2023   Procedure: URETERAL BIOPSY WITH  POSSIBLE ABLATION OF TUMOR;  Surgeon: [name], MD;  Location: [facility];  Service: Urology;  Laterality: Left;   URETEROSCOPY Left 03/13/2023   Procedure: URETEROSCOPY;  Surgeon: [name], MD;  Location: [facility];  Service: Urology;  Laterality: Left;   URETEROSCOPY Left 04/10/2023   Procedure: DIAGNOSTIC URETEROSCOPY;  Surgeon: [name], MD;  Location: [facility];  Service: Urology;  Laterality: Left;    Home Medications:  Allergies as of 06/27/2023       Reactions   Ceftriaxone Hives   Tolerated 1st generation cephalosporin (CEFAZOLIN) on 06/20/2018 and 09/11/2019 without documented ADRs. Previously tolerated amoxicillin Blisters and Hives around mouth and neck.  Some blistering of lips.  Mouth not effected.  Patient started on ceftriaxone and doxycyline 1-2 days prior to reaction.     Doxycycline Hives   Blisters and Hives around mouth and neck.  Some blistering of lips.  Mouth not effected. Patient started on ceftriaxone and doxycyline 1-2 days prior to reaction.     Macrobid [nitrofurantoin] Rash        Medication List  Accurate as of June 27, 2023 11:30 AM. If you have any questions, ask your nurse or doctor.          STOP taking these medications    oxybutynin 5 MG tablet Commonly known as: DITROPAN   oxyCODONE-acetaminophen 5-325 MG tablet Commonly known as: PERCOCET/ROXICET   senna-docusate 8.6-50 MG tablet Commonly known as: Senokot-S       TAKE these medications    aspirin EC 81 MG tablet Take 81 mg by mouth daily.   carvedilol 3.125 MG tablet Commonly known as: COREG Take 1 tablet (3.125 mg total) by mouth 2 (two) times daily with a meal.   cyanocobalamin 500 MCG tablet Commonly known as: VITAMIN B12 Take 500 mcg by mouth daily.   docusate sodium 100 MG capsule Commonly known as: COLACE Take 1 capsule (100 mg total) by mouth 2 (two) times daily.   finasteride 5 MG tablet Commonly known as: PROSCAR Take 1 tablet (5 mg  total) by mouth daily. What changed: when to take this   fluticasone 50 MCG/ACT nasal spray Commonly known as: FLONASE Place 2 sprays into both nostrils daily as needed for allergies.   IRON PO Take 1 tablet by mouth daily.   levothyroxine 25 MCG tablet Commonly known as: SYNTHROID Take 25 mcg by mouth daily before breakfast.   lisinopril-hydrochlorothiazide 20-25 MG tablet Commonly known as: ZESTORETIC Take 0.5 tablets by mouth every morning.   multivitamin with minerals Tabs tablet Take 1 tablet by mouth daily.   Omeprazole 20 MG Tbec Take 20 mg by mouth every morning.   OVER THE COUNTER MEDICATION Take 2 tablets by mouth daily. Arthritis/Joint supplement, Omega XL   simvastatin 40 MG tablet Commonly known as: ZOCOR Take 40 mg by mouth every morning.   tamsulosin 0.4 MG Caps capsule Commonly known as: FLOMAX Take 1 capsule (0.4 mg total) by mouth daily. What changed: when to take this   traZODone 100 MG tablet Commonly known as: DESYREL Take 100 mg by mouth at bedtime.   VITAMIN D PO Take 1 tablet by mouth daily at 2 PM.        Allergies:  Allergies  Allergen Reactions   Ceftriaxone Hives    Tolerated 1st generation cephalosporin (CEFAZOLIN) on 06/20/2018 and 09/11/2019 without documented ADRs. Previously tolerated amoxicillin  Blisters and Hives around mouth and neck.  Some blistering of lips.  Mouth not effected.  Patient started on ceftriaxone and doxycyline 1-2 days prior to reaction.     Doxycycline Hives    Blisters and Hives around mouth and neck.  Some blistering of lips.  Mouth not effected. Patient started on ceftriaxone and doxycyline 1-2 days prior to reaction.     Macrobid [Nitrofurantoin] Rash    Family History: Family History  Problem Relation Age of Onset   Hyperlipidemia Brother    Hyperlipidemia Sister    Heart attack Father     Social History:  reports that he has never smoked. He has never been exposed to tobacco smoke. He has  never used smokeless tobacco. He reports that he does not drink alcohol and does not use drugs.   Physical Exam: BP 129/72   Pulse 73   Ht 5\' 7"  (1.702 m)   Wt 167 lb 4 oz (75.9 kg)   BMI 26.20 kg/m   Constitutional:  Alert and oriented, No acute distress. HEENT: [place] AT, moist mucus membranes.  Trachea midline, no masses. Neurologic: Grossly intact, no focal deficits, moving all 4 extremities. Abd: Wounds well  healed, no hernia. Psychiatric: Normal mood and affect. Skin: Mid-line incision and lap port sites well healed. No hernias.   Assessment & Plan:    History of upper tract urothelial carcinoma  - Pathology reviewed. Wounds are healing well. No indication for adjuvant chemotherapy at this time; organ confined.  - Will continue to monitor bladder and contralateral kidney for recurrence. Consider CT urogram in 6-12 months.  2. Solitary kidney  - Precautions reviewed. He has labs with Dr. Larwance Sachs in 2 months. Will review and follow up on these.  3. History of bladder cancer  - Plan to continue fairly frequent cystoscopies; next one in a month or so. He is agreeable to this plan.  Return in about 6 weeks (around 08/08/2023) for cystoscopy.  I have reviewed the above documentation for accuracy and completeness, and I agree with the above.   [name], MD    [facility] [address] [place], [place] [address] [phone]

## 2023-06-27 NOTE — Patient Instructions (Signed)

## 2023-07-26 ENCOUNTER — Other Ambulatory Visit: Payer: Self-pay

## 2023-07-26 ENCOUNTER — Ambulatory Visit: Payer: Medicare Other | Admitting: Urology

## 2023-07-26 ENCOUNTER — Encounter: Payer: Self-pay | Admitting: Urology

## 2023-07-26 VITALS — BP 126/70 | HR 68

## 2023-07-26 DIAGNOSIS — R3129 Other microscopic hematuria: Secondary | ICD-10-CM

## 2023-07-26 DIAGNOSIS — Z01812 Encounter for preprocedural laboratory examination: Secondary | ICD-10-CM

## 2023-07-26 DIAGNOSIS — Z8551 Personal history of malignant neoplasm of bladder: Secondary | ICD-10-CM

## 2023-07-26 DIAGNOSIS — N329 Bladder disorder, unspecified: Secondary | ICD-10-CM

## 2023-07-26 DIAGNOSIS — D494 Neoplasm of unspecified behavior of bladder: Secondary | ICD-10-CM

## 2023-07-26 DIAGNOSIS — Z8603 Personal history of neoplasm of uncertain behavior: Secondary | ICD-10-CM

## 2023-07-26 LAB — URINALYSIS, COMPLETE
Bilirubin, UA: NEGATIVE
Glucose, UA: NEGATIVE
Ketones, UA: NEGATIVE
Leukocytes,UA: NEGATIVE
Nitrite, UA: NEGATIVE
Protein,UA: NEGATIVE
Specific Gravity, UA: 1.01 (ref 1.005–1.030)
Urobilinogen, Ur: 0.2 mg/dL (ref 0.2–1.0)
pH, UA: 5.5 (ref 5.0–7.5)

## 2023-07-26 LAB — MICROSCOPIC EXAMINATION

## 2023-07-26 NOTE — H&P (View-Only) (Signed)
   07/26/23  CC:  Chief Complaint  Patient presents with   Cysto    HPI: 77 year-old male with a personal history of bladder cancer and left upper tract urothelial cancer returns today for a follow-up.   On 05/15/2023 he underwent left nephroureterectomy. He declined the neoadjuvant chemotherapy. 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 All margins were negative. pTaNXMX. Creatinine at the time of discharge was normal at 0.98.   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 His last cystoscopy was at the time of diagnostic ureteroscopy in April of 2024.   He has a personal history of BPH and is managed on Finasteride and Flomax.  Blood pressure 126/70, pulse 68. NED. A&Ox3.   No respiratory distress   Abd soft, NT, ND Normal phallus with bilateral descended testicles  Cystoscopy Procedure Note  Patient identification was confirmed, informed consent was obtained, and patient was prepped using Betadine solution.  Lidocaine jelly was administered per urethral meatus.     Pre-Procedure: - Inspection reveals hypospadias, subcoronal  Procedure: The flexible cystoscope was introduced without difficulty - No urethral strictures/lesions are present. - Enlarged prostate  - Elevated bladder neck - Bladder mucosa  reveals papillary carpeting of tumor, approximately 1 cm along the right bladder neck/right lateral bladder wall.  UO surgically absent.  UO does not appear to be involved. - No bladder stones - No trabeculation  Retroflexion shows papillary tumor at bladder neck as previously mentioned  Post-Procedure: - Patient tolerated the procedure well  Assessment/ Plan:  Recurrent bladder cancer/history of high-grade left upper tract urothelial carcinoma -Another  recurrence noted today at right bladder neck/lateral wall -Recommend return to the operating room for TURBT, right retrograde pyelogram and ensure vesicle gemcitabine.  Risk and benefits discussed.  He may benefit from another course of BCG if this is high-grade.  All questions were answered.  Preoperative urine culture.    [name], MD

## 2023-07-26 NOTE — Progress Notes (Unsigned)
Surgical Physician Order Form [facility]  Dr. [name], MD  * Scheduling expectation : Next Available  *Length of Case:   *Clearance needed: no  *Anticoagulation Instructions: Hold all anticoagulants  *Aspirin Instructions: Hold Aspirin  *Post-op visit Date/Instructions:  1 week discuss pathology  *Diagnosis: Bladder Tumor  *Procedure: right retrograde pyelogram, TURBT <2cm (40981), instillation of intravesical gemcitabine   Additional orders: Gemcitabine 2000mg  bladder instillation  -Admit type: OUTpatient  -Anesthesia: General  -VTE Prophylaxis Standing Order SCD's       Other:   -Standing Lab Orders Per Anesthesia    Lab other: None  -Standing Test orders EKG/Chest x-ray per Anesthesia       Test other:   - Medications:  Ancef 2gm IV  -Other orders:  N/A

## 2023-07-26 NOTE — Addendum Note (Signed)
Addended by: [name] on: 07/26/2023 02:27 PM   Modules accepted: Orders

## 2023-07-26 NOTE — Progress Notes (Signed)
   07/26/23  CC:  Chief Complaint  Patient presents with   Cysto    HPI: 77 year-old male with a personal history of bladder cancer and left upper tract urothelial cancer returns today for a follow-up.   On 05/15/2023 he underwent left nephroureterectomy. He declined the neoadjuvant chemotherapy. 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 All margins were negative. pTaNXMX. Creatinine at the time of discharge was normal at 0.98.   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 His last cystoscopy was at the time of diagnostic ureteroscopy in April of 2024.   He has a personal history of BPH and is managed on Finasteride and Flomax.  Blood pressure 126/70, pulse 68. NED. A&Ox3.   No respiratory distress   Abd soft, NT, ND Normal phallus with bilateral descended testicles  Cystoscopy Procedure Note  Patient identification was confirmed, informed consent was obtained, and patient was prepped using Betadine solution.  Lidocaine jelly was administered per urethral meatus.     Pre-Procedure: - Inspection reveals hypospadias, subcoronal  Procedure: The flexible cystoscope was introduced without difficulty - No urethral strictures/lesions are present. - Enlarged prostate  - Elevated bladder neck - Bladder mucosa  reveals papillary carpeting of tumor, approximately 1 cm along the right bladder neck/right lateral bladder wall.  UO surgically absent.  UO does not appear to be involved. - No bladder stones - No trabeculation  Retroflexion shows papillary tumor at bladder neck as previously mentioned  Post-Procedure: - Patient tolerated the procedure well  Assessment/ Plan:  Recurrent bladder cancer/history of high-grade left upper tract urothelial carcinoma -Another  recurrence noted today at right bladder neck/lateral wall -Recommend return to the operating room for TURBT, right retrograde pyelogram and ensure vesicle gemcitabine.  Risk and benefits discussed.  He may benefit from another course of BCG if this is high-grade.  All questions were answered.  Preoperative urine culture.    [name], MD

## 2023-07-27 ENCOUNTER — Telehealth: Payer: Self-pay

## 2023-07-27 NOTE — Telephone Encounter (Signed)
Per Dr. Apolinar Junes,  Patient is to be scheduled for Transurethral Resection of Bladder Tumor with Intravesical Instillation of Gemcitabine and Right Retrograde Pyelogram   Trevor [name] was contacted and possible surgical dates were discussed, Monday September 9th, 2024 was agreed upon for surgery.   Patient was directed to call [phone] between 1-3pm the day before surgery to find out surgical arrival time.  Instructions were given not to eat or drink from midnight on the night before surgery and have a driver for the day of surgery. On the surgery day patient was instructed to enter through the Medical Mall entrance of [facility] report the Same Day Surgery desk.   Pre-Admit Testing will be in contact via phone to set up an interview with the anesthesia team to review your history and medications prior to surgery.   Reminder of this information was sent via MyChart to the patient.

## 2023-07-27 NOTE — Progress Notes (Signed)
   [facility] Surgical Posting Form  Surgery Date: Date: 08/21/2023  Surgeon: Dr. [name], MD  Inpt ( No  )   Outpt (Yes)   Obs ( No  )   Diagnosis: D49.4 Bladder Tumor  -CPT: 16109, 51720, [phone]  Surgery: Transurethral Resection of Bladder Tumor with Intravesical Instillation of Gemcitabine and Right Retrograde Pyelogram  Stop Anticoagulations: Yes and also hold ASA  Cardiac/Medical/Pulmonary Clearance needed: no  *Orders entered into EPIC  Date: 07/27/23   *Case booked in [place]  Date: 07/26/2023  *Notified pt of Surgery: Date: 07/26/2023  PRE-OP UA & CX: no  *Placed into Prior Authorization Work [place] Date: 07/27/23  Assistant/laser/rep:No

## 2023-07-30 LAB — CULTURE, URINE COMPREHENSIVE

## 2023-07-31 ENCOUNTER — Other Ambulatory Visit: Payer: Self-pay

## 2023-07-31 MED ORDER — CIPROFLOXACIN HCL 500 MG PO TABS: 500.0000 mg | ORAL_TABLET | Freq: Two times a day (BID) | ORAL | 0 refills | Status: DC

## 2023-08-02 NOTE — Addendum Note (Signed)
Addended by: [name] on: 08/02/2023 07:56 AM   Modules accepted: Orders

## 2023-08-07 ENCOUNTER — Telehealth: Payer: Self-pay

## 2023-08-07 NOTE — Telephone Encounter (Signed)
No, this should be sufficient.  [name], MD

## 2023-08-07 NOTE — Telephone Encounter (Signed)
Patient called in and stated that he started having bladder pressure and burning with urination. He started taking antibiotics this morning since he feels like the infection is worsening. Wasn't sure if he would need additional dosages closer to surgery.

## 2023-08-15 ENCOUNTER — Encounter
Admission: RE | Admit: 2023-08-15 | Discharge: 2023-08-15 | Disposition: A | Discharge status: Home / Self Care | Payer: Medicare Other | Source: Ambulatory Visit | Attending: Urology | Admitting: Urology

## 2023-08-15 ENCOUNTER — Other Ambulatory Visit: Payer: Self-pay

## 2023-08-15 DIAGNOSIS — Z01812 Encounter for preprocedural laboratory examination: Secondary | ICD-10-CM

## 2023-08-15 DIAGNOSIS — E119 Type 2 diabetes mellitus without complications: Secondary | ICD-10-CM

## 2023-08-15 DIAGNOSIS — I10 Essential (primary) hypertension: Secondary | ICD-10-CM

## 2023-08-15 HISTORY — DX: Hypothyroidism, unspecified: E03.9

## 2023-08-15 NOTE — Patient Instructions (Addendum)
Your procedure is scheduled on: 08/21/23 - Monday Report to the Registration Desk on the 1st floor of the Medical Mall. To find out your arrival time, please call [phone] between 1PM - 3PM on: 08/18/23 - Friday If your arrival time is 6:00 am, do not arrive before that time as the Medical Mall entrance doors do not open until 6:00 am.  REMEMBER: Instructions that are not followed completely may result in serious medical risk, up to and including death; or upon the discretion of your surgeon and anesthesiologist your surgery may need to be rescheduled.  Do not eat food or drink any liquids after midnight the night before surgery.  No gum chewing or hard candies.  One week prior to surgery: Stop Anti-inflammatories (NSAIDS) such as Advil, Aleve, Ibuprofen, Motrin, Naproxen, Naprosyn and Aspirin based products such as Excedrin, Goody's Powder, BC Powder.  Stop ANY OVER THE COUNTER supplements until after surgery.  You may however, continue to take Tylenol if needed for pain up until the day of surgery.   TAKE ONLY THESE MEDICATIONS THE MORNING OF SURGERY WITH A SIP OF WATER:  Omeprazole - (take one the night before and one on the morning of surgery - helps to prevent nausea after surgery.) carvedilol (COREG)  finasteride (PROSCAR ) levothyroxine (SYNTHROID)  tamsulosin (FLOMAX)    No Alcohol for 24 hours before or after surgery.  No Smoking including e-cigarettes for 24 hours before surgery.  No chewable tobacco products for at least 6 hours before surgery.  No nicotine patches on the day of surgery.  Do not use any "recreational" drugs for at least a week (preferably 2 weeks) before your surgery.  Please be advised that the combination of cocaine and anesthesia may have negative outcomes, up to and including death. If you test positive for cocaine, your surgery will be cancelled.  On the morning of surgery brush your teeth with toothpaste and water, you may rinse your mouth  with mouthwash if you wish. Do not swallow any toothpaste or mouthwash.  Use CHG Soap or wipes as directed on instruction sheet.  Do not wear jewelry, make-up, hairpins, clips or nail polish.  Do not wear lotions, powders, or perfumes.   Do not shave body hair from the neck down 48 hours before surgery.  Contact lenses, hearing aids and dentures may not be worn into surgery.  Do not bring valuables to the hospital. [facility] is not responsible for any missing/lost belongings or valuables.   Notify your doctor if there is any change in your medical condition (cold, fever, infection).  Wear comfortable clothing (specific to your surgery type) to the hospital.  After surgery, you can help prevent lung complications by doing breathing exercises.  Take deep breaths and cough every 1-2 hours. Your doctor may order a device called an Incentive Spirometer to help you take deep breaths. When coughing or sneezing, hold a pillow firmly against your incision with both hands. This is called "splinting." Doing this helps protect your incision. It also decreases belly discomfort.  If you are being admitted to the hospital overnight, leave your suitcase in the car. After surgery it may be brought to your room.  In case of increased patient census, it may be necessary for you, the patient, to continue your postoperative care in the Same Day Surgery department.  If you are being discharged the day of surgery, you will not be allowed to drive home. You will need a responsible individual to drive you home  and stay with you for 24 hours after surgery.   If you are taking public transportation, you will need to have a responsible individual with you.  Please call the Pre-admissions Testing Dept. at [phone] if you have any questions about these instructions.  Surgery Visitation Policy:  Patients having surgery or a procedure may have two visitors.  Children under the age of 42 must have an  adult with them who is not the patient.  Inpatient Visitation:    Visiting hours are 7 a.m. to 8 p.m. Up to four visitors are allowed at one time in a patient room. The visitors may rotate out with other people during the day.  One visitor age 45 or older may stay with the patient overnight and must be in the room by 8 p.m.     Preparing for Surgery with CHLORHEXIDINE GLUCONATE (CHG) Soap  Chlorhexidine Gluconate (CHG) Soap  o An antiseptic cleaner that kills germs and bonds with the skin to continue killing germs even after washing  o Used for showering the night before surgery and morning of surgery  Before surgery, you can play an important role by reducing the number of germs on your skin.  CHG (Chlorhexidine gluconate) soap is an antiseptic cleanser which kills germs and bonds with the skin to continue killing germs even after washing.  Please do not use if you have an allergy to CHG or antibacterial soaps. If your skin becomes reddened/irritated stop using the CHG.  1. Shower the NIGHT BEFORE SURGERY and the MORNING OF SURGERY with CHG soap.  2. If you choose to wash your hair, wash your hair first as usual with your normal shampoo.  3. After shampooing, rinse your hair and body thoroughly to remove the shampoo.  4. Use CHG as you would any other liquid soap. You can apply CHG directly to the skin and wash gently with a scrungie or a clean washcloth.  5. Apply the CHG soap to your body only from the neck down. Do not use on open wounds or open sores. Avoid contact with your eyes, ears, mouth, and genitals (private parts). Wash face and genitals (private parts) with your normal soap.  6. Wash thoroughly, paying special attention to the area where your surgery will be performed.  7. Thoroughly rinse your body with warm water.  8. Do not shower/wash with your normal soap after using and rinsing off the CHG soap.  9. Pat yourself dry with a clean towel.  10. Wear clean  pajamas to bed the night before surgery.  12. Place clean sheets on your bed the night of your first shower and do not sleep with pets.  13. Shower again with the CHG soap on the day of surgery prior to arriving at the hospital.  14. Do not apply any deodorants/lotions/powders.  15. Please wear clean clothes to the hospital.

## 2023-08-16 ENCOUNTER — Encounter
Admission: RE | Admit: 2023-08-16 | Discharge: 2023-08-16 | Disposition: A | Discharge status: Home / Self Care | Payer: Medicare Other | Source: Ambulatory Visit | Attending: Urology | Admitting: Urology

## 2023-08-16 ENCOUNTER — Encounter: Payer: Self-pay | Admitting: Urology

## 2023-08-16 DIAGNOSIS — I10 Essential (primary) hypertension: Secondary | ICD-10-CM | POA: Insufficient documentation

## 2023-08-16 DIAGNOSIS — Z01812 Encounter for preprocedural laboratory examination: Secondary | ICD-10-CM | POA: Diagnosis not present

## 2023-08-16 DIAGNOSIS — Z01818 Encounter for other preprocedural examination: Secondary | ICD-10-CM | POA: Diagnosis present

## 2023-08-16 LAB — CBC
HCT: 32.8 % — ABNORMAL LOW (ref 39.0–52.0)
Hemoglobin: 11 g/dL — ABNORMAL LOW (ref 13.0–17.0)
MCH: 28.5 pg (ref 26.0–34.0)
MCHC: 33.5 g/dL (ref 30.0–36.0)
MCV: 85 fL (ref 80.0–100.0)
Platelets: 114 10*3/uL — ABNORMAL LOW (ref 150–400)
RBC: 3.86 MIL/uL — ABNORMAL LOW (ref 4.22–5.81)
RDW: 13.8 % (ref 11.5–15.5)
WBC: 3.6 10*3/uL — ABNORMAL LOW (ref 4.0–10.5)
nRBC: 0 % (ref 0.0–0.2)

## 2023-08-16 LAB — BASIC METABOLIC PANEL
Anion gap: 9 (ref 5–15)
BUN: 29 mg/dL — ABNORMAL HIGH (ref 8–23)
CO2: 23 mmol/L (ref 22–32)
Calcium: 8.8 mg/dL — ABNORMAL LOW (ref 8.9–10.3)
Chloride: 101 mmol/L (ref 98–111)
Creatinine, Ser: 1.62 mg/dL — ABNORMAL HIGH (ref 0.61–1.24)
GFR, Estimated: 43 mL/min — ABNORMAL LOW (ref >60)
Glucose, Bld: 104 mg/dL — ABNORMAL HIGH (ref 70–99)
Potassium: 4.1 mmol/L (ref 3.5–5.1)
Sodium: 133 mmol/L — ABNORMAL LOW (ref 135–145)

## 2023-08-16 NOTE — Progress Notes (Signed)
Perioperative / Anesthesia Services  Pre-Admission Testing Clinical Review / Preoperative Anesthesia Consult  Date: 08/17/23  Patient Demographics:  Name: Trevor [name] DOB:   Jan 09, 1946 MRN:   [id number]  Planned Surgical Procedure(s):    Case: [id number] Date/Time: 08/21/23 1255   Procedures:      TRANSURETHRAL RESECTION OF BLADDER TUMOR (TURBT)     CYSTOSCOPY WITH RETROGRADE PYELOGRAM (Right)     BLADDER INSTILLATION OF GEMCITABINE   Anesthesia type: General   Pre-op diagnosis: Bladder Tumor   Location: [facility] OR ROOM 10 / [facility]   Surgeons: [name], MD     NOTE: Available PAT nursing documentation and vital signs have been reviewed. Clinical nursing staff has updated patient's PMH/PSHx, current medication list, and drug allergies/intolerances to ensure comprehensive history available to assist in medical decision making as it pertains to the aforementioned surgical procedure and anticipated anesthetic course. Extensive review of available clinical information personally performed. Trevor [name] PMH and PSHx updated with any diagnoses/procedures that  may have been inadvertently omitted during his intake with the pre-admission testing department's nursing staff.  Clinical Discussion:  Trevor [name] is a 77 y.o. male who is submitted for pre-surgical anesthesia review and clearance prior to him undergoing the above procedure. Patient has never been a smoker. Pertinent PMH includes: CAD, aortic stenosis, NSVT, diastolic dysfunction, cardiac murmur, HTN, HLD, T2DM, CKD-III, esophageal stricture (s/p dilatation), GERD (on daily PPI), OSAH (does not utilize nocturnal PAP therapy), splenomegaly, cirrhosis, anemia, thrombocytopenia, chronic lower back pain, lumbar spondylosis (s/p multiple lumbosacral spinal surgeries), bladder cancer, cervical DDD, BPH, anxiety, depression, sleep difficulties.  Patient is followed by cardiology Darrold Junker, MD). He was last seen in the  cardiology clinic on 05/29/2023; notes reviewed. At the time of his clinic visit, patient doing well overall from a cardiovascular perspective. Patient denied any chest pain, shortness of breath, PND, orthopnea, palpitations, significant peripheral edema, weakness, fatigue, vertiginous symptoms, or presyncope/syncope. Patient with a past medical history significant for cardiovascular diagnoses. Documented physical exam was grossly benign, providing no evidence of acute exacerbation and/or decompensation of the patient's known cardiovascular conditions.  Long-term cardiac event monitor study performed on 10/05/2022 revealed a predominant underlying sinus rhythm with a average heart rate of 65 bpm; range 45-108 bpm.  There were occasional PACs.  There were no significant arrhythmias or pauses noted.  Most recent TTE was performed on 10/12/2022 revealing normal left ventricular systolic function with an EF of >55%.  There was mild concentric LVH.  There were no regional wall motion abnormalities. Left ventricular diastolic Doppler parameters consistent with abnormal relaxation (G1DD).  There was mild biatrial and mild right ventricular enlargement.  Trivial to mild pan valvular regurgitation observed.  Mild aortic valve stenosis noted with a mean pressure gradient of 8.2 mmHg; AVA (VTI) = 1.3 cm.  Blood pressure well controlled at 110/58 mmHg on currently prescribed beta-blocker (carvedilol), ACEi (lisinopril) and diuretic (HCTZ) therapies. Patient is on simvastatin for his HLD diagnosis and ASCVD prevention.  T2DM well controlled with diet and lifestyle modification alone; last HgbA1c  was 5.6% when checked on 02/21/2023. Patient does have an OSAH diagnosis, however he does not utilize nocturnal PAP therapy.  Patient makes efforts to remain active.  He continues to work full-time. Patient does not have a standard exercise routine. Functional capacity, as defined by DASI, is documented as being >/= 4 METS.  No  changes were made to his medication regimen.  Patient to follow-up with outpatient cardiology in 6  months or sooner if needed.  Trevor [name] is scheduled for TRANSURETHRAL RESECTION OF BLADDER TUMOR (TURBT); CYSTOSCOPY WITH RETROGRADE PYELOGRAM (Right); BLADDER INSTILLATION OF GEMCITABINE on 08/21/2023 with Dr. [name], MD.  Given patient's past medical history significant for cardiovascular diagnoses, presurgical cardiac clearance was sought by the PAT team. Per cardiology, "this patient is optimized for surgery and may proceed with the planned procedural course with a LOW risk of significant perioperative cardiovascular complications".  In review of his medication reconciliation, it is noted that patient is currently on prescribed daily antithrombotic therapy.  Patient has been instructed on recommendations from his cardiologist for holding his daily low-dose ASA for 5 days prior to his procedure with plans to restart since postoperatively respectively minimized by his primary attending surgeon.  Patient is aware that his last dose of ASA should be on 08/15/2023.  Patient denies previous perioperative complications with anesthesia in the past. In review of the available records, it is noted that patient underwent a general anesthetic course here at [facility] ([facility]) in 05/2023 without documented complications.      07/26/2023    1:35 PM 06/27/2023    9:39 AM 05/22/2023    9:05 AM  Vitals with BMI  Height  5\' 7"  5\' 7"   Weight  167 lbs 4 oz 175 lbs  BMI  26.19 27.4  Systolic 126 129 161  Diastolic 70 72 73  Pulse 68 73 89    Providers/Specialists:   NOTE: Primary physician provider listed below. Patient may have been seen by APP or partner within same practice.   PROVIDER ROLE / SPECIALTY LAST Jaquelyn Bitter, MD Urology (Surgeon) 07/26/2023  Kandyce Rud, MD Primary Care Provider 02/28/2023  Marcina Millard, MD Cardiology 05/29/2023    Allergies:  Ceftriaxone, Doxycycline, and Macrobid [nitrofurantoin]  Current Home Medications:   No current facility-administered medications for this encounter.    acetaminophen (TYLENOL) 650 MG CR tablet   aspirin 81 MG EC tablet   carvedilol (COREG) 3.125 MG tablet   docusate sodium (COLACE) 100 MG capsule   Ferrous Sulfate (IRON PO)   finasteride (PROSCAR) 5 MG tablet   fluticasone (FLONASE) 50 MCG/ACT nasal spray   levothyroxine (SYNTHROID) 25 MCG tablet   lisinopril-hydrochlorothiazide (ZESTORETIC) 20-25 MG tablet   Multiple Vitamin (MULTIVITAMIN WITH MINERALS) TABS tablet   Omeprazole 20 MG TBEC   OVER THE COUNTER MEDICATION   simvastatin (ZOCOR) 40 MG tablet   tamsulosin (FLOMAX) 0.4 MG CAPS capsule   traZODone (DESYREL) 100 MG tablet   vitamin B-12 (CYANOCOBALAMIN) 500 MCG tablet   VITAMIN D PO   History:   Past Medical History:  Diagnosis Date   Acne    AKI (acute kidney injury) (HCC)    Allergic rhinitis    Anemia    Anxiety    Aortic stenosis    a.) TTE 10/12/2022: mild AS (MPG 8.2); AVA (VTI) = 1.3 cm2   Bladder cancer (HCC)    a.) s/p robot assisted laparoscopic LEFT nephroureterectomy + intravesical gemcitabine  05/15/2023   BPH (benign prostatic hypertrophy) with urinary obstruction    CAD (coronary artery disease)    Carpal tunnel syndrome    Cervicalgia    Chronic calcific pancreatitis (HCC)    Chronic low back pain    Cirrhosis (HCC)    CKD (chronic kidney disease), stage III (HCC)    Degenerative disc disease, cervical    Depression    Diastolic dysfunction    a.)  TTE 10/12/2022: EF >55%, mild LVH, mild BAE, mild RVE, triv AR/PR, mild MR/TR, G1DD   Diet-controlled type 2 diabetes mellitus (HCC)    Esophageal stricture    a.) s/p dilatation   Gastritis    GERD (gastroesophageal reflux disease)    Heart murmur    Hemorrhoids    History of malignant neoplasm of bladder    Hyperlipidemia    Hypertension    Hypokalemia    Hypospadias     Hypothyroidism    Lumbar spondylosis    a.) s/p L3-L5 XLIF, L5-S1 TLIF, L3-S1 PSF 11/20/2017; b.) s/p lumbar laminectomy and decompression L2-L3 06/20/2018; c.) s/p L2-L3 XLIF 09/11/2019; d.) s/p posterior L2-L4 fusion 10/02/2019   Non-sustained ventricular tachycardia (HCC) 09/2022   a.) 8 beat episode of NSVT during admission in setting urosepsis   Sepsis (HCC)    Sleep apnea    a.) does not utilize nocturnal PAP therapy   Sleep difficulties    a.) on trazodone PRN   Splenomegaly    Thrombocytopenia (HCC)    Urinary frequency    Past Surgical History:  Procedure Laterality Date   ADENOIDECTOMY     ANTERIOR FUSION LUMBAR SPINE  11/20/2017   multiple levels; Dr. Myer Haff   ANTERIOR LAT LUMBAR FUSION N/A 09/11/2019   Procedure: PRONE XLIF L2-3;  Surgeon: Venetia Night, MD;  Location: [facility] ORS;  Service: Neurosurgery;  Laterality: N/A;   BIOPSY Left 03/13/2023   Procedure: BIOPSY;  Surgeon: [name], MD;  Location: [facility] ORS;  Service: Urology;  Laterality: Left;   BLADDER INSTILLATION N/A 05/15/2023   Procedure: BLADDER INSTILLATION OF GEMCITABINE;  Surgeon: [name], MD;  Location: [facility] ORS;  Service: Urology;  Laterality: N/A;   BLADDER SURGERY     2011, 2013, 2015   COLONOSCOPY WITH PROPOFOL N/A 07/31/2015   Procedure: COLONOSCOPY WITH PROPOFOL;  Surgeon: Scot Jun, MD;  Location: [facility] ENDOSCOPY;  Service: Endoscopy;  Laterality: N/A;   COLONOSCOPY, ESOPHAGOGASTRODUODENOSCOPY (EGD) AND ESOPHAGEAL DILATION  2006   CYSTOSCOPY W/ RETROGRADES Bilateral 11/11/2021   Procedure: CYSTOSCOPY WITH RETROGRADE PYELOGRAM;  Surgeon: [name], MD;  Location: [facility] ORS;  Service: Urology;  Laterality: Bilateral;   CYSTOSCOPY W/ RETROGRADES Bilateral 03/13/2023   Procedure: CYSTOSCOPY WITH RETROGRADE PYELOGRAM;  Surgeon: [name], MD;  Location: [facility] ORS;  Service: Urology;  Laterality: Bilateral;   CYSTOSCOPY W/ RETROGRADES Left 04/10/2023   Procedure: CYSTOSCOPY  WITH RETROGRADE PYELOGRAM;  Surgeon: [name], MD;  Location: [facility] ORS;  Service: Urology;  Laterality: Left;   CYSTOSCOPY W/ URETERAL STENT PLACEMENT Left 04/10/2023   Procedure: CYSTOSCOPY WITH STENT EXCHANGE;  Surgeon: [name], MD;  Location: [facility] ORS;  Service: Urology;  Laterality: Left;   CYSTOSCOPY WITH BIOPSY N/A 11/11/2021   Procedure: CYSTOSCOPY WITH BLADDER BIOPSY;  Surgeon: [name], MD;  Location: [facility] ORS;  Service: Urology;  Laterality: N/A;   CYSTOSCOPY WITH BIOPSY N/A 03/13/2023   Procedure: CYSTOSCOPY WITH BLADDER BIOPSY;  Surgeon: [name], MD;  Location: [facility] ORS;  Service: Urology;  Laterality: N/A;   FLEXIBLE SIGMOIDOSCOPY  1994   LUMBAR LAMINECTOMY/DECOMPRESSION MICRODISCECTOMY N/A 06/20/2018   Procedure: LUMBAR LAMINECTOMY/DECOMPRESSION MICRODISCECTOMY 1 LEVEL-L-2-3;  Surgeon: Venetia Night, MD;  Location: [facility] ORS;  Service: Neurosurgery;  Laterality: N/A;   POSTERIOR FUSION LUMBAR SPINE  10/02/2019   L2-4   POSTERIOR LUMBAR FUSION 4 WITH HARDWARE REMOVAL N/A 09/11/2019   Procedure: L2-S1 POSTERIOR FUSION, ABORTED;  Surgeon: Venetia Night, MD;  Location: [facility] ORS;  Service: Neurosurgery;  Laterality: N/A;  ROBOT ASSITED LAPAROSCOPIC NEPHROURETERECTOMY Left 05/15/2023   Procedure: XI ROBOT ASSITED LAPAROSCOPIC NEPHROURETERECTOMY;  Surgeon: [name], MD;  Location: [facility] ORS;  Service: Urology;  Laterality: Left;   SEPTOPLASTY  1996   TONSILLECTOMY     URETERAL BIOPSY Left 04/10/2023   Procedure: URETERAL BIOPSY WITH POSSIBLE ABLATION OF TUMOR;  Surgeon: [name], MD;  Location: [facility] ORS;  Service: Urology;  Laterality: Left;   URETEROSCOPY Left 03/13/2023   Procedure: URETEROSCOPY;  Surgeon: [name], MD;  Location: [facility] ORS;  Service: Urology;  Laterality: Left;   URETEROSCOPY Left 04/10/2023   Procedure: DIAGNOSTIC URETEROSCOPY;  Surgeon: [name], MD;  Location: [facility] ORS;  Service: Urology;  Laterality: Left;    Family History  Problem Relation Age of Onset   Hyperlipidemia Brother    Hyperlipidemia Sister    Heart attack Father    Social History   Tobacco Use   Smoking status: Never    Passive exposure: Never   Smokeless tobacco: Never  Vaping Use   Vaping status: Never Used  Substance Use Topics   Alcohol use: No    Alcohol/week: 0.0 standard drinks of alcohol   Drug use: No    Pertinent Clinical Results:  LABS:   Lab Results  Component Value Date   WBC 3.6 (L) 08/16/2023   HGB 11.0 (L) 08/16/2023   HCT 32.8 (L) 08/16/2023   MCV 85.0 08/16/2023   PLT 114 (L) 08/16/2023   Lab Results  Component Value Date   NA 133 (L) 08/16/2023   K 4.1 08/16/2023   CO2 23 08/16/2023   GLUCOSE 104 (H) 08/16/2023   BUN 29 (H) 08/16/2023   CREATININE 1.62 (H) 08/16/2023   CALCIUM 8.8 (L) 08/16/2023   GFRNONAA 43 (L) 08/16/2023    ECG: Date: 04/03/2023 Time ECG obtained: 1554 PM Rate: 72 bpm Rhythm: normal sinus Axis (leads I and aVF): Right axis deviation Intervals: PR 142 ms. QRS 74 ms. QTc 427 ms. ST segment and T wave changes: No significant ST or T wave abnormalities  Comparison: Similar to previous tracing obtained on 09/24/2022    IMAGING / PROCEDURES: CT CHEST ABDOMEN PELVIS W CONTRAST performed on 05/02/2023 1.8 cm soft tissue density within the lower pole left renal collecting system is consistent with urothelial carcinoma. No evidence of metastatic disease. Advanced cirrhosis without hepatocellular carcinoma Subtle subpleural ground-glass and reticulation without craniocaudal gradient is suspicious for interstitial lung disease, possibly early nonspecific interstitial pneumonia. Correlate with pulmonary symptoms and consider high-resolution chest CT follow-up at 1 year. Chronic calcific pancreatitis. Aortic atherosclerosis   TRANSTHORACIC ECHOCARDIOGRAM performed on 10/12/2022 Normal left ventricular systolic function with an EF of >55%  Mild concentric LVH No  regional wall motion abnormalities Left ventricular diastolic Doppler parameters consistent with abnormal relaxation (G1DD). Mild right ventricular enlargement mild biatrial enlargement Trivial AR and PR Mild MR and TR Trivial aortic stenosis with a mean pressure gradient of 8.2 mmHg; AVA (VTI) = 1.3 cm No pericardial effusion  LONG TERM CARDIAC EVENT MONITOR STUDY performed on 10/05/2022 Predominant underlying sinus rhythm with a average heart rate of 65 bpm; range 45-108 bpm.   There were occasional PACs.   There were no significant arrhythmias or pauses noted.  Impression and Plan:  CREEDENCE LANDAVERDE has been referred for pre-anesthesia review and clearance prior to him undergoing the planned anesthetic and procedural courses. Available labs, pertinent testing, and imaging results were personally reviewed by me in preparation for upcoming operative/procedural course. [facility] medical record has been  updated following extensive record review and patient interview with PAT staff.   This patient has been appropriately cleared by cardiology with an overall LOW risk of significant perioperative cardiovascular complications. Based on clinical review performed today (08/17/23), barring any significant acute changes in the patient's overall condition, it is anticipated that he will be able to proceed with the planned surgical intervention. Any acute changes in clinical condition may necessitate his procedure being postponed and/or cancelled. Patient will meet with anesthesia team (MD and/or CRNA) on the day of his procedure for preoperative evaluation/assessment. Questions regarding anesthetic course will be fielded at that time.   Pre-surgical instructions were reviewed with the patient during his PAT appointment, and questions were fielded to satisfaction by PAT clinical staff. He has been instructed on which medications that he will need to hold prior to surgery, as well as the ones that have been  deemed safe/appropriate to take of the day of his procedure. As part of the general education provided by PAT, patient made aware both verbally and in writing, that he would need to abstain from the use of any illegal substances during his perioperative course.  He was advised that failure to follow the provided instructions could necessitate case cancellation or result serious perioperative complications up to and including death. Patient encouraged to contact PAT and/or his surgeon's office to discuss any questions or concerns that may arise prior to surgery; verbalized understanding.   Quentin Mulling, MSN, APRN, FNP-C, CEN [facility]  Peri-operative Services Nurse Practitioner Phone: [phone] Fax: [phone] 08/17/23 10:38 AM  NOTE: This note has been prepared using Dragon dictation software. Despite my best ability to proofread, there is always the potential that unintentional transcriptional errors may still occur from this process.

## 2023-08-21 ENCOUNTER — Encounter: Payer: Self-pay | Admitting: Urology

## 2023-08-21 ENCOUNTER — Encounter
Admission: RE | Disposition: A | Discharge status: Home / Self Care | Payer: Self-pay | Source: Home / Self Care | Attending: Urology

## 2023-08-21 ENCOUNTER — Other Ambulatory Visit: Payer: Self-pay

## 2023-08-21 ENCOUNTER — Ambulatory Visit
Admission: RE | Admit: 2023-08-21 | Discharge: 2023-08-21 | Disposition: A | Discharge status: Home / Self Care | Payer: Medicare Other | Attending: Urology | Admitting: Urology

## 2023-08-21 ENCOUNTER — Ambulatory Visit: Payer: Medicare Other

## 2023-08-21 ENCOUNTER — Ambulatory Visit: Payer: Medicare Other | Admitting: Urgent Care

## 2023-08-21 DIAGNOSIS — Z906 Acquired absence of other parts of urinary tract: Secondary | ICD-10-CM | POA: Diagnosis not present

## 2023-08-21 DIAGNOSIS — N400 Enlarged prostate without lower urinary tract symptoms: Secondary | ICD-10-CM | POA: Diagnosis not present

## 2023-08-21 DIAGNOSIS — Z87448 Personal history of other diseases of urinary system: Secondary | ICD-10-CM | POA: Diagnosis not present

## 2023-08-21 DIAGNOSIS — N35912 Unspecified bulbous urethral stricture, male: Secondary | ICD-10-CM | POA: Insufficient documentation

## 2023-08-21 DIAGNOSIS — F32A Depression, unspecified: Secondary | ICD-10-CM | POA: Insufficient documentation

## 2023-08-21 DIAGNOSIS — K219 Gastro-esophageal reflux disease without esophagitis: Secondary | ICD-10-CM | POA: Diagnosis not present

## 2023-08-21 DIAGNOSIS — G4733 Obstructive sleep apnea (adult) (pediatric): Secondary | ICD-10-CM | POA: Diagnosis not present

## 2023-08-21 DIAGNOSIS — N401 Enlarged prostate with lower urinary tract symptoms: Secondary | ICD-10-CM | POA: Diagnosis not present

## 2023-08-21 DIAGNOSIS — Z8554 Personal history of malignant neoplasm of ureter: Secondary | ICD-10-CM | POA: Diagnosis not present

## 2023-08-21 DIAGNOSIS — Z79899 Other long term (current) drug therapy: Secondary | ICD-10-CM | POA: Insufficient documentation

## 2023-08-21 DIAGNOSIS — I129 Hypertensive chronic kidney disease with stage 1 through stage 4 chronic kidney disease, or unspecified chronic kidney disease: Secondary | ICD-10-CM | POA: Diagnosis not present

## 2023-08-21 DIAGNOSIS — D631 Anemia in chronic kidney disease: Secondary | ICD-10-CM | POA: Insufficient documentation

## 2023-08-21 DIAGNOSIS — D494 Neoplasm of unspecified behavior of bladder: Secondary | ICD-10-CM

## 2023-08-21 DIAGNOSIS — Z905 Acquired absence of kidney: Secondary | ICD-10-CM | POA: Insufficient documentation

## 2023-08-21 DIAGNOSIS — D090 Carcinoma in situ of bladder: Secondary | ICD-10-CM | POA: Diagnosis not present

## 2023-08-21 DIAGNOSIS — E1122 Type 2 diabetes mellitus with diabetic chronic kidney disease: Secondary | ICD-10-CM | POA: Diagnosis not present

## 2023-08-21 DIAGNOSIS — C679 Malignant neoplasm of bladder, unspecified: Secondary | ICD-10-CM | POA: Diagnosis present

## 2023-08-21 DIAGNOSIS — Z981 Arthrodesis status: Secondary | ICD-10-CM | POA: Diagnosis not present

## 2023-08-21 DIAGNOSIS — N183 Chronic kidney disease, stage 3 unspecified: Secondary | ICD-10-CM | POA: Diagnosis not present

## 2023-08-21 DIAGNOSIS — Q549 Hypospadias, unspecified: Secondary | ICD-10-CM | POA: Diagnosis not present

## 2023-08-21 DIAGNOSIS — Z7982 Long term (current) use of aspirin: Secondary | ICD-10-CM | POA: Insufficient documentation

## 2023-08-21 DIAGNOSIS — E785 Hyperlipidemia, unspecified: Secondary | ICD-10-CM | POA: Diagnosis not present

## 2023-08-21 DIAGNOSIS — I2510 Atherosclerotic heart disease of native coronary artery without angina pectoris: Secondary | ICD-10-CM | POA: Diagnosis not present

## 2023-08-21 DIAGNOSIS — Z8550 Personal history of malignant neoplasm of unspecified urinary tract organ: Secondary | ICD-10-CM

## 2023-08-21 DIAGNOSIS — Q548 Other hypospadias: Secondary | ICD-10-CM | POA: Diagnosis not present

## 2023-08-21 DIAGNOSIS — F419 Anxiety disorder, unspecified: Secondary | ICD-10-CM | POA: Insufficient documentation

## 2023-08-21 DIAGNOSIS — K7460 Unspecified cirrhosis of liver: Secondary | ICD-10-CM | POA: Diagnosis not present

## 2023-08-21 DIAGNOSIS — C678 Malignant neoplasm of overlapping sites of bladder: Secondary | ICD-10-CM | POA: Diagnosis not present

## 2023-08-21 DIAGNOSIS — E119 Type 2 diabetes mellitus without complications: Secondary | ICD-10-CM

## 2023-08-21 HISTORY — DX: Esophageal obstruction: K22.2

## 2023-08-21 HISTORY — PX: CYSTOSCOPY W/ RETROGRADES: SHX1426

## 2023-08-21 HISTORY — DX: Type 2 diabetes mellitus without complications: E11.9

## 2023-08-21 HISTORY — PX: TRANSURETHRAL RESECTION OF BLADDER TUMOR: SHX2575

## 2023-08-21 HISTORY — PX: CYSTOSCOPY WITH STENT PLACEMENT: SHX5790

## 2023-08-21 HISTORY — PX: BLADDER INSTILLATION: SHX6893

## 2023-08-21 HISTORY — DX: Chronic kidney disease, stage 3 unspecified: N18.30

## 2023-08-21 LAB — GLUCOSE, CAPILLARY: Glucose-Capillary: 120 mg/dL — ABNORMAL HIGH (ref 70–99)

## 2023-08-21 SURGERY — TURBT (TRANSURETHRAL RESECTION OF BLADDER TUMOR)
Anesthesia: General | Site: Ureter | Laterality: Right

## 2023-08-21 MED ORDER — SODIUM CHLORIDE 0.9 % IV SOLN: INTRAVENOUS | Status: DC

## 2023-08-21 MED ORDER — LIDOCAINE HCL (CARDIAC) PF 100 MG/5ML IV SOSY
PREFILLED_SYRINGE | INTRAVENOUS | Status: DC | PRN
  Administered 2023-08-21: 80 mg via INTRAVENOUS

## 2023-08-21 MED ORDER — PHENYLEPHRINE HCL (PRESSORS) 10 MG/ML IV SOLN
INTRAVENOUS | Status: DC | PRN
  Administered 2023-08-21 (×2): 160 ug via INTRAVENOUS
  Administered 2023-08-21: 80 ug via INTRAVENOUS

## 2023-08-21 MED ORDER — ACETAMINOPHEN 10 MG/ML IV SOLN: 1000.0000 mg | Freq: Once | INTRAVENOUS | Status: DC | PRN

## 2023-08-21 MED ORDER — DEXAMETHASONE SODIUM PHOSPHATE 10 MG/ML IJ SOLN
INTRAMUSCULAR | Status: DC | PRN
  Administered 2023-08-21: 10 mg via INTRAVENOUS

## 2023-08-21 MED ORDER — SODIUM CHLORIDE 0.9 % IR SOLN
Status: DC | PRN
  Administered 2023-08-21 (×2): 3000 mL

## 2023-08-21 MED ORDER — MIDAZOLAM HCL 2 MG/2ML IJ SOLN
INTRAMUSCULAR | Status: AC
  Filled 2023-08-21: qty 2

## 2023-08-21 MED ORDER — EPHEDRINE SULFATE (PRESSORS) 50 MG/ML IJ SOLN
INTRAMUSCULAR | Status: DC | PRN
Start: 2023-08-21 — End: 2023-08-21
  Administered 2023-08-21: 10 mg via INTRAVENOUS

## 2023-08-21 MED ORDER — DEXAMETHASONE SODIUM PHOSPHATE 10 MG/ML IJ SOLN
INTRAMUSCULAR | Status: AC
  Filled 2023-08-21: qty 1

## 2023-08-21 MED ORDER — ORAL CARE MOUTH RINSE: 15.0000 mL | Freq: Once | OROMUCOSAL | Status: AC

## 2023-08-21 MED ORDER — FENTANYL CITRATE (PF) 100 MCG/2ML IJ SOLN
INTRAMUSCULAR | Status: AC
  Filled 2023-08-21: qty 2

## 2023-08-21 MED ORDER — HYDROCODONE-ACETAMINOPHEN 5-325 MG PO TABS
1.0000 | ORAL_TABLET | Freq: Four times a day (QID) | ORAL | 0 refills | Status: DC | PRN
Start: 2023-08-21 — End: 2023-08-29

## 2023-08-21 MED ORDER — IOHEXOL 180 MG/ML  SOLN
INTRAMUSCULAR | Status: DC | PRN
  Administered 2023-08-21: 10 mL

## 2023-08-21 MED ORDER — CHLORHEXIDINE GLUCONATE 0.12 % MT SOLN
15.0000 mL | Freq: Once | OROMUCOSAL | Status: AC
  Administered 2023-08-21: 15 mL via OROMUCOSAL

## 2023-08-21 MED ORDER — CEFAZOLIN SODIUM-DEXTROSE 2-4 GM/100ML-% IV SOLN
2.0000 g | INTRAVENOUS | Status: AC
  Administered 2023-08-21: 2 g via INTRAVENOUS

## 2023-08-21 MED ORDER — KETAMINE HCL 10 MG/ML IJ SOLN
INTRAMUSCULAR | Status: DC | PRN
  Administered 2023-08-21: 20 mg via INTRAVENOUS
  Administered 2023-08-21: 30 mg via INTRAVENOUS

## 2023-08-21 MED ORDER — GEMCITABINE CHEMO FOR BLADDER INSTILLATION 2000 MG
2000.0000 mg | Freq: Once | INTRAVENOUS | Status: AC
  Administered 2023-08-21: 2000 mg via INTRAVESICAL
  Filled 2023-08-21: qty 2000
  Filled 2023-08-21: qty 52.6

## 2023-08-21 MED ORDER — EPHEDRINE 5 MG/ML INJ
INTRAVENOUS | Status: AC
  Filled 2023-08-21: qty 5

## 2023-08-21 MED ORDER — OXYCODONE HCL 5 MG PO TABS: 5.0000 mg | ORAL_TABLET | Freq: Once | ORAL | Status: DC | PRN

## 2023-08-21 MED ORDER — OXYCODONE HCL 5 MG/5ML PO SOLN: 5.0000 mg | Freq: Once | ORAL | Status: DC | PRN

## 2023-08-21 MED ORDER — PROPOFOL 10 MG/ML IV BOLUS
INTRAVENOUS | Status: DC | PRN
  Administered 2023-08-21: 140 mg via INTRAVENOUS

## 2023-08-21 MED ORDER — PROPOFOL 10 MG/ML IV BOLUS
INTRAVENOUS | Status: AC
  Filled 2023-08-21: qty 20

## 2023-08-21 MED ORDER — FENTANYL CITRATE (PF) 100 MCG/2ML IJ SOLN: 25.0000 ug | INTRAMUSCULAR | Status: DC | PRN

## 2023-08-21 MED ORDER — CEFAZOLIN SODIUM-DEXTROSE 2-4 GM/100ML-% IV SOLN
INTRAVENOUS | Status: AC
  Filled 2023-08-21: qty 100

## 2023-08-21 MED ORDER — ONDANSETRON HCL 4 MG/2ML IJ SOLN
INTRAMUSCULAR | Status: AC
  Filled 2023-08-21: qty 2

## 2023-08-21 MED ORDER — KETAMINE HCL 50 MG/5ML IJ SOSY
PREFILLED_SYRINGE | INTRAMUSCULAR | Status: AC
  Filled 2023-08-21: qty 5

## 2023-08-21 MED ORDER — FENTANYL CITRATE (PF) 100 MCG/2ML IJ SOLN
INTRAMUSCULAR | Status: DC | PRN
  Administered 2023-08-21 (×3): 25 ug via INTRAVENOUS

## 2023-08-21 MED ORDER — GLYCOPYRROLATE 0.2 MG/ML IJ SOLN
INTRAMUSCULAR | Status: DC | PRN
  Administered 2023-08-21: .2 mg via INTRAVENOUS

## 2023-08-21 MED ORDER — VASOPRESSIN 20 UNIT/ML IV SOLN
INTRAVENOUS | Status: DC | PRN
Start: 2023-08-21 — End: 2023-08-21
  Administered 2023-08-21: 1 [IU] via INTRAVENOUS

## 2023-08-21 MED ORDER — MIDAZOLAM HCL 2 MG/2ML IJ SOLN
INTRAMUSCULAR | Status: DC | PRN
  Administered 2023-08-21: 2 mg via INTRAVENOUS

## 2023-08-21 MED ORDER — OXYBUTYNIN CHLORIDE 5 MG PO TABS: 5.0000 mg | ORAL_TABLET | Freq: Three times a day (TID) | ORAL | 0 refills | Status: DC | PRN

## 2023-08-21 MED ORDER — ONDANSETRON HCL 4 MG/2ML IJ SOLN: 4.0000 mg | Freq: Once | INTRAMUSCULAR | Status: DC | PRN

## 2023-08-21 MED ORDER — ONDANSETRON HCL 4 MG/2ML IJ SOLN
INTRAMUSCULAR | Status: DC | PRN
  Administered 2023-08-21: 4 mg via INTRAVENOUS

## 2023-08-21 MED ORDER — CHLORHEXIDINE GLUCONATE 0.12 % MT SOLN
OROMUCOSAL | Status: AC
  Filled 2023-08-21: qty 15

## 2023-08-21 MED ORDER — VASOPRESSIN 20 UNIT/ML IV SOLN
INTRAVENOUS | Status: AC
  Filled 2023-08-21: qty 1

## 2023-08-21 SURGICAL SUPPLY — 34 items
BAG DRAIN SIEMENS DORNER NS (MISCELLANEOUS) ×3 IMPLANT
BAG DRN NS LF (MISCELLANEOUS) ×3
BAG DRN RND TRDRP ANRFLXCHMBR (UROLOGICAL SUPPLIES) ×3
BAG URINE DRAIN 2000ML AR STRL (UROLOGICAL SUPPLIES) ×3 IMPLANT
BRUSH SCRUB EZ 1% IODOPHOR (MISCELLANEOUS) ×3 IMPLANT
BRUSH SCRUB EZ 4% CHG (MISCELLANEOUS) ×3 IMPLANT
CATH FOLEY 2WAY 5CC 16FR (CATHETERS) ×3
CATH URETL OPEN 5X70 (CATHETERS) ×3 IMPLANT
CATH URTH 16FR FL 2W BLN LF (CATHETERS) ×3 IMPLANT
DRAPE UTILITY 15X26 TOWEL STRL (DRAPES) ×3 IMPLANT
DRSG TELFA 3X4 N-ADH STERILE (GAUZE/BANDAGES/DRESSINGS) ×3 IMPLANT
ELECT LOOP 22F BIPOLAR SML (ELECTROSURGICAL) ×3
ELECT REM PT RETURN 9FT ADLT (ELECTROSURGICAL)
ELECTRODE LOOP 22F BIPOLAR SML (ELECTROSURGICAL) IMPLANT
ELECTRODE REM PT RTRN 9FT ADLT (ELECTROSURGICAL) IMPLANT
GLOVE BIO SURGEON STRL SZ 6.5 (GLOVE) ×3 IMPLANT
GOWN STRL REUS W/ TWL LRG LVL3 (GOWN DISPOSABLE) ×6 IMPLANT
GOWN STRL REUS W/TWL LRG LVL3 (GOWN DISPOSABLE) ×6
GUIDEWIRE STR DUAL SENSOR (WIRE) ×3 IMPLANT
IV NS IRRIG 3000ML ARTHROMATIC (IV SOLUTION) ×3 IMPLANT
KIT TURNOVER CYSTO (KITS) ×3 IMPLANT
LOOP CUT BIPOLAR 24F LRG (ELECTROSURGICAL) IMPLANT
NDL SAFETY ECLIP 18X1.5 (MISCELLANEOUS) ×3 IMPLANT
PACK CYSTO AR (MISCELLANEOUS) ×3 IMPLANT
PAD ARMBOARD 7.5X6 YLW CONV (MISCELLANEOUS) ×3 IMPLANT
SET CYSTO W/LG BORE CLAMP LF (SET/KITS/TRAYS/PACK) ×3 IMPLANT
SET IRRIG Y TYPE TUR BLADDER L (SET/KITS/TRAYS/PACK) ×3 IMPLANT
STENT URET 6FRX24 CONTOUR (STENTS) IMPLANT
SURGILUBE 2OZ TUBE FLIPTOP (MISCELLANEOUS) ×3 IMPLANT
SYR TOOMEY IRRIG 70ML (MISCELLANEOUS) ×3
SYRINGE TOOMEY IRRIG 70ML (MISCELLANEOUS) ×3 IMPLANT
WATER STERILE IRR 1000ML POUR (IV SOLUTION) ×3 IMPLANT
WATER STERILE IRR 3000ML UROMA (IV SOLUTION) IMPLANT
WATER STERILE IRR 500ML POUR (IV SOLUTION) ×3 IMPLANT

## 2023-08-21 NOTE — Discharge Instructions (Addendum)
You have a ureteral stent in place.  This is a tube that extends from your kidney to your bladder.  This may cause urinary bleeding, burning with urination, and urinary frequency.  Please call our office or present to the ED if you develop fevers >101 or pain which is not able to be controlled with oral pain medications.  You may be given either Flomax and/ or ditropan to help with bladder spasms and stent pain in addition to pain medications.    [facility] [address], [address] [place], [place] [address] [phone]   Transurethral Resection of Bladder Tumor (TURBT) or Bladder Biopsy   Definition:  Transurethral Resection of the Bladder Tumor is a surgical procedure used to diagnose and remove tumors within the bladder. TURBT is the most common treatment for early stage bladder cancer.  General instructions:     Your recent bladder surgery requires very little post hospital care but some definite precautions.  Despite the fact that no skin incisions were used, the area around the bladder incisions are raw and covered with scabs to promote healing and prevent bleeding. Certain precautions are needed to insure that the scabs are not disturbed over the next 2-4 weeks while the healing proceeds.  Because the raw surface inside your bladder and the irritating effects of urine you may expect frequency of urination and/or urgency (a stronger desire to urinate) and perhaps even getting up at night more often. This will usually resolve or improve slowly over the healing period. You may see some blood in your urine over the first 6 weeks. Do not be alarmed, even if the urine was clear for a while. Get off your feet and drink lots of fluids until clearing occurs. If you start to pass clots or don't improve call us.  Diet:  You may return to your normal diet immediately. Because of the raw surface of your bladder, alcohol, spicy foods, foods high in acid and drinks with  caffeine may cause irritation or frequency and should be used in moderation. To keep your urine flowing freely and avoid constipation, drink plenty of fluids during the day (8-10 glasses). Tip: Avoid cranberry juice because it is very acidic.  Activity:  Your physical activity doesn't need to be restricted. However, if you are very active, you may see some blood in the urine. We suggest that you reduce your activity under the circumstances until the bleeding has stopped.  Bowels:  It is important to keep your bowels regular during the postoperative period. Straining with bowel movements can cause bleeding. A bowel movement every other day is reasonable. Use a mild laxative if needed, such as milk of magnesia 2-3 tablespoons, or 2 Dulcolax tablets. Call if you continue to have problems. If you had been taking narcotics for pain, before, during or after your surgery, you may be constipated. Take a laxative if necessary.    Medication:  You should resume your pre-surgery medications unless told not to. In addition you may be given an antibiotic to prevent or treat infection. Antibiotics are not always necessary. All medication should be taken as prescribed until the bottles are finished unless you are having an unusual reaction to one of the drugs.   [facility] [place], [place] [address] [phone]   AMBULATORY SURGERY  DISCHARGE INSTRUCTIONS   The drugs that you were given will stay in your system until tomorrow so for the next 24 hours you should not:  Drive an automobile Make  any legal decisions Drink any alcoholic beverage   You may resume regular meals tomorrow.  Today it is better to start with liquids and gradually work up to solid foods.  You may eat anything you prefer, but it is better to start with liquids, then soup and crackers, and gradually work up to solid foods.   Please notify your doctor immediately if you have any unusual bleeding, trouble  breathing, redness and pain at the surgery site, drainage, fever, or pain not relieved by medication.    Additional Instructions:        Please contact your physician with any problems or Same Day Surgery at [phone], Monday through Friday 6 am to 4 pm, or [place] [facility] number at [phone].

## 2023-08-21 NOTE — Transfer of Care (Signed)
Immediate Anesthesia Transfer of Care Note  Patient: Trevor [name]  Procedure(s) Performed: TRANSURETHRAL RESECTION OF BLADDER TUMOR (TURBT) (Bladder) CYSTOSCOPY WITH RETROGRADE PYELOGRAM (Right: Bladder) BLADDER INSTILLATION OF GEMCITABINE (Bladder) CYSTOSCOPY WITH STENT PLACEMENT (Right: Ureter)  Patient Location: PACU  Anesthesia Type:General  Level of Consciousness: awake and alert   Airway & Oxygen Therapy: Patient Spontanous Breathing and Patient connected to face mask oxygen  Post-op Assessment: Report given to RN and Post -op Vital signs reviewed and stable  Post vital signs: Reviewed and stable  Last Vitals:  Vitals Value Taken Time  BP 127/66 08/21/23 1239  Temp    Pulse 67 08/21/23 1242  Resp 8 08/21/23 1242  SpO2 100 % 08/21/23 1242  Vitals shown include unfiled device data.  Last Pain:  Vitals:   08/21/23 1118  PainSc: 0-No pain         Complications: No notable events documented.

## 2023-08-21 NOTE — Interval H&P Note (Signed)
History and Physical Interval Note:  08/21/2023 11:41 AM  Trevor [name]  has presented today for surgery, with the diagnosis of Bladder Tumor.  The various methods of treatment have been discussed with the patient and family. After consideration of risks, benefits and other options for treatment, the patient has consented to  Procedure(s): TRANSURETHRAL RESECTION OF BLADDER TUMOR (TURBT) (N/A) CYSTOSCOPY WITH RETROGRADE PYELOGRAM (Right) BLADDER INSTILLATION OF GEMCITABINE (N/A) as a surgical intervention.  The patient's history has been reviewed, patient examined, no change in status, stable for surgery.  I have reviewed the patient's chart and labs.  Questions were answered to the patient's satisfaction.    [facility] CTAB  [name]

## 2023-08-21 NOTE — Op Note (Signed)
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  The right UO did appear to have some involvement right on the lateralmost edge which was resected.  Retrograde pyelogram normal.  Left UO surgically absent.  EBL: Minimal  Specimens: Bladder tumor  Drains: 6 x 24 French double-J ureteral stent on right, 16 French Foley catheter  Indication: Trevor [name] is a 77 y.o. patient with personal history of recurrent bladder cancer more recently found to have high-grade left upper tract urothelial carcinoma status post nephro ureterectomy on the left.  He returns today with a recurrence at the right bladder neck and right lateral wall.  After reviewing the management options for treatment, he elected to proceed with the above surgical procedure(s). We have discussed the potential benefits and risks of the procedure, side effects of the proposed treatment, the likelihood of the patient achieving the goals of the procedure, and any potential problems that might occur during the procedure or recuperation. Informed consent has been obtained.  Description of procedure:  The patient was taken to the operating room and general anesthesia was induced.  The patient was placed in the dorsal lithotomy position, prepped and draped in the usual sterile fashion, and preoperative antibiotics were administered. A preoperative time-out was performed.   Subcoronal hypospadias noted.  Scope was  introduced using a 21 [place] cystoscope.  A bulbar urethral stricture which was previously identified was seen and nondense, easily passable with the scope.  The bladder was then carefully inspected.  The left UO was surgically absent there was a scar at the location of the previous UO.  On the right lateral bladder neck, there is papillary carpeting, approximately 1.5 cm in largest diameter involving the right bladder neck and right lateral bladder wall.  The UO itself did have of what appeared to be tumor just on the edge of the lateralmost aspect concerning for involvement.  The remainder of the bladder other than the above remained tumor free.  At this point in time, the right UO was intubated using a 5 [place] open-ended ureteral catheter.  A retrograde pyelogram on the side showed a normal right upper tract without hydroureteronephrosis or filling defects.  I then exchanged the scope for a 26 French resectoscope.  Using saline as the medium, used a bipolar loop to resect all of the tumor.  I did resect the very edge of the right UO very superficially but not the entirety of this orifice.  The tumor chips were evacuated from the bladder.  The base of the tumor bed was then fulgurated using Bugbee electrocautery and beyond the visual recurrence with ablative intent.  I did not fulgurate adjacent or over the right UO due to concern for scarring.  Given that a solitary kidney on this side, I did elect going to place a stent in the side due to manipulation.  A sensor wire was placed up to the level of the kidney and then fluoroscopically I advanced a 6 x 24 French double-J ureteral stent up to the level of the kidney.  Once the wire was withdrawn, full coil was noted  both within the renal pelvis and within the bladder which was visualized cystoscopically.  Additional fulguration was then performed at the bladder neck and anywhere that looked remotely suspicious for any low-grade recurrence.  Finally, 16 French  Foley catheter was placed.  Patient was then cleaned and dried, repositioned in supine position, rest of anesthesia, taken to the PACU in stable condition.  2000 mg of intravesical gemcitabine was instilled into the bladder and allowed to dwell for 1 hour.  The end of 1 hour, the bladder was drained and the Foley catheter was removed.  This was well-tolerated.  Plan: Plan for follow-up next week for stent removal.  Will also consider reinduction of BCG if this is in fact high-grade superficial recurrence.  [name], M.D.

## 2023-08-21 NOTE — Anesthesia Preprocedure Evaluation (Signed)
Anesthesia Evaluation  Patient identified by MRN, date of birth, ID band Patient awake    Reviewed: Allergy & Precautions, H&P , NPO status , Patient's Chart, lab work & pertinent test results  History of Anesthesia Complications Negative for: history of anesthetic complications  Airway Mallampati: III  TM Distance: <3 FB Neck ROM: limited    Dental  (+) Dental Advidsory Given, Poor Dentition   Pulmonary neg shortness of breath, sleep apnea , neg recent URI   Pulmonary exam normal        Cardiovascular Exercise Tolerance: Good hypertension, Pt. on medications (-) angina + CAD  (-) Past MI and (-) DOE + dysrhythmias (h/o NSVT in setting of urosepsis 10/23) + Valvular Problems/Murmurs MR and AS  Rhythm:Regular Rate:Normal  10/12/2022 1. Normal left ventricular systolic function with an EF of >55%  2. Mild concentric LVH 3. No regional wall motion abnormalities 4. Left ventricular diastolic Doppler parameters consistent with abnormal relaxation (G1DD). 5. Mild right ventricular enlargement mild biatrial enlargement 6. Trivial AR and PR 7. Mild MR and TR 8. Trivial aortic stenosis with a mean pressure gradient of 8.2 mmHg; AVA (VTI) = 1.3 cm 9. No pericardial effusion    Neuro/Psych neg Seizures PSYCHIATRIC DISORDERS Anxiety Depression    Cervical DDD  Neuromuscular disease (chronic back pain s/p fusions)    GI/Hepatic ,GERD  Controlled and Medicated,,(+) Cirrhosis  (seen on CT scan)      Splenomegaly   Endo/Other  negative endocrine ROSdiabetes    Renal/GU CRFRenal disease   Left Urothelial Mass, History of Bladder Cancer    Musculoskeletal  (+) Arthritis ,    Abdominal Normal abdominal exam  (+)   Peds  Hematology  (+) Blood dyscrasia, anemia anemia, thrombocytopenia   Anesthesia Other Findings Past Medical History: No date: Acne No date: Allergic rhinitis No date: Anemia No date: Anxiety No date: BPH  (benign prostatic hypertrophy) with urinary obstruction No date: [facility])     Comment:  bladder CA; remission since Oct 2014 No date: Carpal tunnel syndrome No date: Cervicalgia No date: Degenerative disc disease, cervical     Comment:  neck and back;  No date: Depression     Comment:  controlled;  No date: Gastritis No date: GERD (gastroesophageal reflux disease) No date: Hemorrhoids No date: History of malignant neoplasm of bladder No date: Hyperlipidemia No date: Hypertension     Comment:  controlled with medication;  No date: Hypospadias No date: Low back pain No date: Mitral regurgitation No date: Pre-diabetes No date: Sleep apnea No date: Urinary frequency  Past Surgical History: 09/11/2019: ANTERIOR LAT LUMBAR FUSION; N/A     Comment:  Procedure: PRONE XLIF L2-3;  Surgeon: Venetia Night, MD;  Location: [facility] ORS;  Service: Neurosurgery;              Laterality: N/A; No date: BLADDER SURGERY     Comment:  2011, 2013, 2015 07/31/2015: COLONOSCOPY WITH PROPOFOL; N/A     Comment:  Procedure: COLONOSCOPY WITH PROPOFOL;  Surgeon: Scot Jun, MD;  Location: [facility] ENDOSCOPY;  Service:               Endoscopy;  Laterality: N/A; No date: COLONOSCOPY, ESOPHAGOGASTRODUODENOSCOPY (EGD) AND ESOPHAGEAL  DILATION No date: FLEXIBLE SIGMOIDOSCOPY 06/20/2018: LUMBAR LAMINECTOMY/DECOMPRESSION MICRODISCECTOMY; N/A     Comment:  Procedure: LUMBAR LAMINECTOMY/DECOMPRESSION  MICRODISCECTOMY 1 LEVEL-L-2-3;  Surgeon: Venetia Night, MD;  Location: [facility] ORS;  Service: Neurosurgery;              Laterality: N/A; 09/11/2019: POSTERIOR LUMBAR FUSION 4 WITH HARDWARE REMOVAL; N/A     Comment:  Procedure: L2-S1 POSTERIOR FUSION, ABORTED;  Surgeon:               Venetia Night, MD;  Location: [facility] ORS;  Service:               Neurosurgery;  Laterality: N/A; No date: SEPTOPLASTY No date: TONSILLECTOMY  BMI    Body Mass  Index: 28.54 kg/m      Reproductive/Obstetrics negative OB ROS                             Anesthesia Physical Anesthesia Plan  ASA: 3  Anesthesia Plan: MAC   Post-op Pain Management: Minimal or no pain anticipated and Dilaudid IV   Induction: Intravenous  PONV Risk Score and Plan: 2 and Ondansetron, Dexamethasone and Treatment may vary due to age or medical condition  Airway Management Planned: LMA  Additional Equipment:   Intra-op Plan:   Post-operative Plan: Extubation in OR  Informed Consent: I have reviewed the patients History and Physical, chart, labs and discussed the procedure including the risks, benefits and alternatives for the proposed anesthesia with the patient or authorized representative who has indicated his/her understanding and acceptance.     Dental Advisory Given  Plan Discussed with: Anesthesiologist, CRNA and Surgeon  Anesthesia Plan Comments: (Patient consented for risks of anesthesia including but not limited to:  - adverse reactions to medications - damage to eyes, teeth, lips or other oral mucosa - nerve damage due to positioning  - sore throat or hoarseness - Damage to heart, brain, nerves, lungs, other parts of body or loss of life  Patient voiced understanding.)        Anesthesia Quick Evaluation

## 2023-08-21 NOTE — Anesthesia Postprocedure Evaluation (Signed)
Anesthesia Post Note  Patient: ESHAUN MANNE  Procedure(s) Performed: TRANSURETHRAL RESECTION OF BLADDER TUMOR (TURBT) (Bladder) CYSTOSCOPY WITH RETROGRADE PYELOGRAM (Right: Bladder) BLADDER INSTILLATION OF GEMCITABINE (Bladder) CYSTOSCOPY WITH STENT PLACEMENT (Right: Ureter)  Patient location during evaluation: PACU Anesthesia Type: General Level of consciousness: awake and alert Pain management: pain level controlled Vital Signs Assessment: post-procedure vital signs reviewed and stable Respiratory status: spontaneous breathing, nonlabored ventilation, respiratory function stable and patient connected to nasal cannula oxygen Cardiovascular status: blood pressure returned to baseline and stable Postop Assessment: no apparent nausea or vomiting Anesthetic complications: no   No notable events documented.   Last Vitals:  Vitals:   08/21/23 1245 08/21/23 1300  BP: 126/64 (!) 152/74  Pulse: 66 72  Resp: 12 11  Temp: 36.5 C   SpO2: 100% 100%    Last Pain:  Vitals:   08/21/23 1300  PainSc: 0-No pain                 Corinda Gubler

## 2023-08-21 NOTE — Group Note (Deleted)

## 2023-08-29 ENCOUNTER — Encounter: Payer: Self-pay | Admitting: Urology

## 2023-08-29 ENCOUNTER — Ambulatory Visit (INDEPENDENT_AMBULATORY_CARE_PROVIDER_SITE_OTHER): Payer: Medicare Other | Admitting: Urology

## 2023-08-29 VITALS — BP 95/63 | HR 87 | Ht 67.0 in | Wt 175.0 lb

## 2023-08-29 DIAGNOSIS — C649 Malignant neoplasm of unspecified kidney, except renal pelvis: Secondary | ICD-10-CM | POA: Diagnosis not present

## 2023-08-29 DIAGNOSIS — Z466 Encounter for fitting and adjustment of urinary device: Secondary | ICD-10-CM | POA: Diagnosis not present

## 2023-08-29 DIAGNOSIS — Z8603 Personal history of neoplasm of uncertain behavior: Secondary | ICD-10-CM

## 2023-08-29 LAB — URINALYSIS, COMPLETE
Bilirubin, UA: NEGATIVE
Glucose, UA: NEGATIVE
Ketones, UA: NEGATIVE
Nitrite, UA: NEGATIVE
Specific Gravity, UA: 1.02 (ref 1.005–1.030)
Urobilinogen, Ur: 0.2 mg/dL (ref 0.2–1.0)
pH, UA: 5.5 (ref 5.0–7.5)

## 2023-08-29 LAB — MICROSCOPIC EXAMINATION: RBC, Urine: >30 /HPF — AB (ref 0–2)

## 2023-08-29 MED ORDER — CEPHALEXIN 250 MG PO CAPS
500.0000 mg | ORAL_CAPSULE | Freq: Once | ORAL | Status: AC
Start: 2023-08-29 — End: 2023-08-29
  Administered 2023-08-29: 500 mg via ORAL

## 2023-08-29 NOTE — Progress Notes (Signed)
   08/29/23  CC:  Chief Complaint  Patient presents with   Cysto Stent Removal    HPI: 77 year old male with a personal history of recurrent bladder cancer more recently found to have high-grade left upper tract urothelial carcinoma status post left nephro ureterectomy found to have recurrences of the right bladder neck and trigone including involvement of the right UO.  Surgical pathology was consistent with low-grade noninvasive urothelial carcinoma.  There was muscle in the specimen it was negative.  He did have a ureteral stent placed at the time of the procedure given the setting of solitary kidney and need for manipulation/partial resection of the orifice.  He presents today for stent removal.   NED. A&Ox3.   No respiratory distress   Abd soft, NT, ND Normal phallus with bilateral descended testicles  Cystoscopy/ Stent removal procedure  Patient identification was confirmed, informed consent was obtained, and patient was prepped using Betadine solution.  Lidocaine jelly was administered per urethral meatus.    Preoperative abx where received prior to procedure.    Procedure: - Flexible cystoscope introduced, without any difficulty.   - Thorough search of the bladder revealed:    normal urethral meatus  Stent seen emanating from right ureteral orifice, grasped with stent graspers, and removed in entirety.    Post-Procedure: - Patient tolerated the procedure well   Assessment/ Plan:  1. Encounter for removal of ureteral stent Stent removed today without difficulty  Single dose of periprocedural antibiotic given today (keflex 500 mg x 1)  RUS in 4 weeks to confirm adequate drainage of solitary kidney - Urinalysis, Complete  2. History of neoplasm of bladder Surgical pathology reviewed with patient, consistent with low-grade recurrent bladder cancer, noninvasive  No further treatment is indicated for this particular pathology.  Will plan for repeat cystoscopy  in 3 months.  He is agreeable this plan.     Will call with RUS results, cysto in 3 months   [name], MD

## 2023-08-29 NOTE — Addendum Note (Signed)
Addended by: Sueanne Margarita on: 08/29/2023 03:53 PM   Modules accepted: Orders

## 2023-10-06 ENCOUNTER — Ambulatory Visit: Payer: Medicare Other

## 2023-10-16 ENCOUNTER — Ambulatory Visit
Admission: RE | Admit: 2023-10-16 | Discharge: 2023-10-16 | Disposition: A | Discharge status: Home / Self Care | Payer: Medicare Other | Source: Ambulatory Visit | Attending: Urology | Admitting: Urology

## 2023-10-16 DIAGNOSIS — N189 Chronic kidney disease, unspecified: Secondary | ICD-10-CM | POA: Diagnosis not present

## 2023-10-16 DIAGNOSIS — C679 Malignant neoplasm of bladder, unspecified: Secondary | ICD-10-CM | POA: Diagnosis not present

## 2023-10-16 DIAGNOSIS — Z466 Encounter for fitting and adjustment of urinary device: Secondary | ICD-10-CM | POA: Insufficient documentation

## 2023-10-16 DIAGNOSIS — Z483 Aftercare following surgery for neoplasm: Secondary | ICD-10-CM | POA: Diagnosis not present

## 2023-10-16 DIAGNOSIS — Z905 Acquired absence of kidney: Secondary | ICD-10-CM | POA: Diagnosis not present

## 2023-10-16 DIAGNOSIS — Z8603 Personal history of neoplasm of uncertain behavior: Secondary | ICD-10-CM | POA: Insufficient documentation

## 2023-10-30 ENCOUNTER — Telehealth: Payer: Self-pay | Admitting: Physician Assistant

## 2023-10-30 ENCOUNTER — Other Ambulatory Visit: Payer: Self-pay

## 2023-10-30 DIAGNOSIS — R310 Gross hematuria: Secondary | ICD-10-CM

## 2023-10-30 DIAGNOSIS — N400 Enlarged prostate without lower urinary tract symptoms: Secondary | ICD-10-CM

## 2023-10-30 MED ORDER — TAMSULOSIN HCL 0.4 MG PO CAPS: 0.4000 mg | ORAL_CAPSULE | Freq: Every day | ORAL | 1 refills | Status: DC

## 2023-10-30 MED ORDER — FINASTERIDE 5 MG PO TABS: 5.0000 mg | ORAL_TABLET | ORAL | 0 refills | Status: DC

## 2023-10-30 NOTE — Addendum Note (Signed)
Addended byRanda Lynn on: 10/30/2023 04:08 PM   Modules accepted: Orders

## 2023-10-30 NOTE — Telephone Encounter (Signed)
Pt would like to have RX for Finasteride renewed at [facility] mail order.

## 2023-10-30 NOTE — Telephone Encounter (Signed)
1 month supple sent to mail order. Pt has appointment with Dr.Brandon next month

## 2023-11-28 ENCOUNTER — Ambulatory Visit (INDEPENDENT_AMBULATORY_CARE_PROVIDER_SITE_OTHER): Payer: Medicare Other | Admitting: Urology

## 2023-11-28 VITALS — BP 133/72 | HR 69

## 2023-11-28 DIAGNOSIS — N401 Enlarged prostate with lower urinary tract symptoms: Secondary | ICD-10-CM

## 2023-11-28 DIAGNOSIS — Z8603 Personal history of neoplasm of uncertain behavior: Secondary | ICD-10-CM

## 2023-11-28 DIAGNOSIS — Z8551 Personal history of malignant neoplasm of bladder: Secondary | ICD-10-CM

## 2023-11-28 DIAGNOSIS — Z8550 Personal history of malignant neoplasm of unspecified urinary tract organ: Secondary | ICD-10-CM

## 2023-11-28 LAB — URINALYSIS, COMPLETE
Bilirubin, UA: NEGATIVE
Glucose, UA: NEGATIVE
Ketones, UA: NEGATIVE
Nitrite, UA: NEGATIVE
Protein,UA: NEGATIVE
Specific Gravity, UA: 1.025 (ref 1.005–1.030)
Urobilinogen, Ur: 0.2 mg/dL (ref 0.2–1.0)
pH, UA: 5 (ref 5.0–7.5)

## 2023-11-28 LAB — MICROSCOPIC EXAMINATION

## 2023-11-28 NOTE — Progress Notes (Signed)
   11/28/23  CC:  Chief Complaint  Patient presents with   Cysto    HPI: 77 year old male with a personal history of recurrent bladder cancer more recently found to have high-grade left upper tract urothelial carcinoma status post left nephro ureterectomy found to have recurrences of the right bladder neck and trigone including involvement of the right UO.  Surgical pathology from 08/21/23 was consistent with low-grade noninvasive urothelial carcinoma.  There was muscle in the specimen it was negative.  Postoperatively, he did have a ureteral stent due to need for resection of the orifice.  This was subsequently removed and follow-up renal ultrasound shows no residual hydronephrosis and a solitary kidney.  NED. A&Ox3.   No respiratory distress   Abd soft, NT, ND Normal phallus with bilateral descended testicles  Cystoscopy Procedure Note   Patient identification was confirmed, informed consent was obtained, and patient was prepped using Betadine solution.  Lidocaine jelly was administered per urethral meatus.       Pre-Procedure: - Inspection reveals hypospadias, subcoronal   Procedure: The flexible cystoscope was introduced without difficulty - No urethral strictures/lesions are present. - Enlarged prostate  - Elevated bladder neck - Bladder mucosa with a few areas of necrosis adjacent to the right UO and bladder neck at the site of previous resections, no obvious underlying tumor in these areas.  The remainder of the bladder is unremarkable without tumors masses or lesions. - No bladder stones - No trabeculation   Retroflexion shows areas of necrosis as above   Post-Procedure: - Patient tolerated the procedure well    Assessment/ Plan:  Recurrent bladder cancer Most recent recurrence was low-grade and superficial.  Will continue to monitor cystoscopically, every 3 month surveillance cystoscopy.  2.  History of high-grade upper tract urothelial carcinoma Status post  nephro ureterectomy with neoadjuvant chemotherapy  Will be due for surveillance imaging following the next cystoscopy   F/u cysto in 3 months   [name], MD

## 2023-12-09 ENCOUNTER — Other Ambulatory Visit: Payer: Self-pay | Admitting: Physician Assistant

## 2023-12-09 DIAGNOSIS — R310 Gross hematuria: Secondary | ICD-10-CM

## 2023-12-09 DIAGNOSIS — N400 Enlarged prostate without lower urinary tract symptoms: Secondary | ICD-10-CM

## 2024-01-03 ENCOUNTER — Other Ambulatory Visit: Payer: Self-pay | Admitting: Urology

## 2024-01-03 DIAGNOSIS — R310 Gross hematuria: Secondary | ICD-10-CM

## 2024-01-03 DIAGNOSIS — N400 Enlarged prostate without lower urinary tract symptoms: Secondary | ICD-10-CM

## 2024-01-10 ENCOUNTER — Other Ambulatory Visit: Payer: Self-pay | Admitting: Physician Assistant

## 2024-02-27 ENCOUNTER — Ambulatory Visit (INDEPENDENT_AMBULATORY_CARE_PROVIDER_SITE_OTHER): Payer: Medicare Other | Admitting: Urology

## 2024-02-27 VITALS — BP 192/90 | HR 77 | Ht 67.0 in | Wt 170.0 lb

## 2024-02-27 DIAGNOSIS — N401 Enlarged prostate with lower urinary tract symptoms: Secondary | ICD-10-CM

## 2024-02-27 DIAGNOSIS — Z8603 Personal history of neoplasm of uncertain behavior: Secondary | ICD-10-CM | POA: Diagnosis not present

## 2024-02-27 DIAGNOSIS — R350 Frequency of micturition: Secondary | ICD-10-CM

## 2024-02-27 NOTE — Progress Notes (Unsigned)
   02/27/24  CC:  Chief Complaint  Patient presents with   Cysto    HPI: 78 year old male with a personal history of recurrent bladder cancer more recently found to have high-grade left upper tract urothelial carcinoma status post left nephro ureterectomy 6/24 pTaNxMx) found to have recurrences of the right bladder neck and trigone including involvement of the right UO.  Surgical pathology from 08/21/23 was consistent with low-grade noninvasive urothelial carcinoma.  There was muscle in the specimen it was negative.  Postoperatively, he did have a ureteral stent due to need for resection of the orifice.  This was subsequently removed and follow-up renal ultrasound shows no residual hydronephrosis and a solitary kidney.  NED. A&Ox3.   No respiratory distress   Abd soft, NT, ND Normal phallus with bilateral descended testicles  Cystoscopy Procedure Note   Patient identification was confirmed, informed consent was obtained, and patient was prepped using Betadine solution.  Lidocaine jelly was administered per urethral meatus.       Pre-Procedure: - Inspection reveals hypospadias, subcoronal   Procedure: The flexible cystoscope was introduced without difficulty - No urethral strictures/lesions are present. - Enlarged prostate  - Elevated bladder neck - Bladder mucosa with a few areas of necrosis adjacent to the right UO and bladder neck at the site of previous resections, no obvious underlying tumor in these areas.  The remainder of the bladder is unremarkable without tumors masses or lesions. - No bladder stones - No trabeculation   Retroflexion shows areas of necrosis as above   Post-Procedure: - Patient tolerated the procedure well    Assessment/ Plan:  Recurrent bladder cancer Most recent recurrence was low-grade and superficial.  Will continue to monitor cystoscopically, every 3 month surveillance cystoscopy.  2.  History of high-grade upper tract urothelial  carcinoma Status post nephro ureterectomy with neoadjuvant chemotherapy  Will be due for surveillance imaging following the next cystoscopy   F/u cysto in 3 months   [name], MD

## 2024-02-28 LAB — URINALYSIS, COMPLETE
Bilirubin, UA: NEGATIVE
Glucose, UA: NEGATIVE
Ketones, UA: NEGATIVE
Leukocytes,UA: NEGATIVE
Nitrite, UA: NEGATIVE
Protein,UA: NEGATIVE
Specific Gravity, UA: 1.025 (ref 1.005–1.030)
Urobilinogen, Ur: 0.2 mg/dL (ref 0.2–1.0)
pH, UA: 5.5 (ref 5.0–7.5)

## 2024-02-28 LAB — MICROSCOPIC EXAMINATION: Bacteria, UA: NONE SEEN

## 2024-02-29 ENCOUNTER — Ambulatory Visit (INDEPENDENT_AMBULATORY_CARE_PROVIDER_SITE_OTHER): Admitting: Urology

## 2024-02-29 VITALS — BP 160/78 | HR 91

## 2024-02-29 DIAGNOSIS — Z792 Long term (current) use of antibiotics: Secondary | ICD-10-CM

## 2024-02-29 MED ORDER — SULFAMETHOXAZOLE-TRIMETHOPRIM 800-160 MG PO TABS
1.0000 | ORAL_TABLET | Freq: Once | ORAL | Status: AC
Start: 2024-02-29 — End: 2024-02-29
  Administered 2024-02-29: 1 via ORAL

## 2024-03-07 NOTE — Progress Notes (Signed)
   02/29/24  CC:  Chief Complaint  Patient presents with   Cysto    Cysto Fulguration    HPI: 78 year old male with a personal history of recurrent bladder cancer more recently found to have high-grade left upper tract urothelial carcinoma status post left nephro ureterectomy 6/24 pTaNxMx) found to have recurrences of the right bladder neck and trigone including involvement of the right UO.  Surgical pathology from 08/21/23 was consistent with low-grade noninvasive urothelial carcinoma.  There was muscle in the specimen it was negative.  More recently, he was noted to have what appears to be a low-grade recurrence on his right lateral bladder neck/bladder wall.  He elected in office fulguration.  Prior to the procedure, lidocaine was instilled to the bladder and allowed to dwell for 30 minutes.  Please see notes for details.  NED. A&Ox3.   No respiratory distress   Abd soft, NT, ND Normal phallus with bilateral descended testicles  Cystoscopy Procedure Note   Patient identification was confirmed, informed consent was obtained, and patient was prepped using Betadine solution.  Lidocaine jelly was administered per urethral meatus.       Pre-Procedure: - Inspection reveals hypospadias, subcoronal   Procedure: The flexible cystoscope was introduced without difficulty - No urethral strictures/lesions are present. - Enlarged prostate  - Elevated bladder neck - Bladder mucosa reveals a stellate scar just beyond the right UO.  On the right lateral to anterior bladder wall, there is a fine carpeting, approximately 8 mm x 5 mm which is highly concerning for early low-grade recurrence. - No bladder stones - No trabeculation   At this point in time, the Bugbee electrocautery was used to completely fulgurate the area on the right lateral bladder wall.  Any area that was remotely suspicious for early recurrence was fulgurated.  This was well-tolerated.  At the end of the procedure, there was  no bleeding noted.   Post-Procedure: - Patient tolerated the procedure well   Assessment/ Plan:  Recurrent bladder cancer/bladder lesion S/p in office fulguration today.  Well-tolerated.  Warning symptoms reviewed.  He was given a dose of prophylactic antibiotics.  2.  History of high-grade upper tract urothelial carcinoma Status post nephro ureterectomy with neoadjuvant chemotherapy  Will consider surveillance imaging following the next cystoscopy  Repeat cysto in 3 months   [name], MD

## 2024-05-28 ENCOUNTER — Ambulatory Visit (INDEPENDENT_AMBULATORY_CARE_PROVIDER_SITE_OTHER): Admitting: Urology

## 2024-05-28 VITALS — BP 162/76 | HR 69 | Ht 66.0 in | Wt 173.0 lb

## 2024-05-28 DIAGNOSIS — R310 Gross hematuria: Secondary | ICD-10-CM

## 2024-05-28 DIAGNOSIS — R350 Frequency of micturition: Secondary | ICD-10-CM

## 2024-05-28 DIAGNOSIS — N401 Enlarged prostate with lower urinary tract symptoms: Secondary | ICD-10-CM | POA: Diagnosis not present

## 2024-05-28 LAB — MICROSCOPIC EXAMINATION

## 2024-05-28 LAB — URINALYSIS, COMPLETE
Bilirubin, UA: NEGATIVE
Glucose, UA: NEGATIVE
Ketones, UA: NEGATIVE
Leukocytes,UA: NEGATIVE
Nitrite, UA: NEGATIVE
Protein,UA: NEGATIVE
RBC, UA: NEGATIVE
Specific Gravity, UA: 1.01 (ref 1.005–1.030)
Urobilinogen, Ur: 0.2 mg/dL (ref 0.2–1.0)
pH, UA: 6 (ref 5.0–7.5)

## 2024-05-31 NOTE — Addendum Note (Signed)
 Addended by: Dustin Gimenez on: 05/31/2024 08:59 AM   Modules accepted: Level of Service

## 2024-05-31 NOTE — Progress Notes (Addendum)
   02/26/24  CC:  Chief Complaint  Patient presents with   Cysto    HPI: 78 year old male with a personal history of recurrent bladder cancer more recently found to have high-grade left upper tract urothelial carcinoma status post left nephro ureterectomy 6/24 pTaNxMx found to have recurrences of the right bladder neck and trigone including involvement of the right UO.  Surgical pathology from 08/21/23 was consistent with low-grade noninvasive urothelial carcinoma.  There was muscle in the specimen it was negative.  More recently, he was noted to have what appears to be a low-grade recurrence on his right lateral bladder neck/bladder wall.  He elected in office fulguration complete in March 2025 which was well tolerated.    Today's Vitals   05/28/24 1534  BP: (!) 162/76  Pulse: 69  Weight: 173 lb (78.5 kg)  Height: 5' 6 (1.676 m)   Body mass index is 27.92 kg/m.  NED. A&Ox3.   No respiratory distress   Abd soft, NT, ND Normal phallus with bilateral descended testicles  Cystoscopy Procedure Note   Patient identification was confirmed, informed consent was obtained, and patient was prepped using Betadine solution.  Lidocaine  jelly was administered per urethral meatus.       Pre-Procedure: - Inspection reveals hypospadias, subcoronal   Procedure: The flexible cystoscope was introduced without difficulty - No urethral strictures/lesions are present. - Enlarged prostate  - Elevated bladder neck - Bladder mucosa reveals a stellate scar just beyond the right UO.  On the right lateral to anterior bladder wall, there is a very small area of necrosis consistent with previous fulguration site.  Surrounding, there is texture on the surface of the mucosa but no overt tumor.  Query whether this is reactive versus persistent or recurrent or early disease. - No bladder stones - No trabeculation   At this point in time, the Bugbee electrocautery was used to completely fulgurate the area  on the right lateral bladder wall.  Any area that was remotely suspicious for early recurrence was fulgurated.  This was well-tolerated.  At the end of the procedure, there was no bleeding noted.   Post-Procedure: - Patient tolerated the procedure well   Assessment/ Plan:  Recurrent bladder cancer/bladder lesion S/p in office fulguration today.  Well-tolerated.  Warning symptoms reviewed.  He was given a dose of prophylactic antibiotics.  2.  History of high-grade upper tract urothelial carcinoma Status post nephro ureterectomy with neoadjuvant chemotherapy  Will consider surveillance imaging following the next cystoscopy  Repeat cysto in 3 months   Dustin Gimenez, MD

## 2024-06-03 NOTE — Progress Notes (Signed)
 Established Patient Visit   Chief Complaint: No chief complaint on file.  Date of Service: 06/03/2024 Date of Birth: 02/01/1946 PCP: Diedra Jerona Ruts, MD  History of Present Illness: [name] is a 78 y.o.male patient who returns for  1.  Nonsustained ventricular tachycardia 2.  Mild aortic stenosis 3.  Essential hypertension 4.  Hyperlipidemia 5.  Type 2 diabetes  The patient was  hospitalized at [facility] 09/22/2022 with urosepsis, with history of bladder cancer, status post surgery and receiving intravesical chemotherapy. Hospitalization was complicated by acute kidney injury and an 8 beat episode of ventricular tachycardia.  The patient was placed on carvedilol  3.25 mg twice daily.   The patient returns today, reports doing well.  He denies exertional chest pain or shortness of breath.  He denies palpitations or heart racing.  He denies peripheral edema.  He denies presyncope or syncope. Patient is active, works full-time but does not exercise regularly.  72-hour Holter monitor 10/05/2022 - 10/08/2022 revealed predominant sinus rhythm with mean heart rate of 65 bpm, heart rate range 45 to 108 bpm, with occasional premature atrial contractions.  2D echocardiogram 10/12/2022 revealed normal left ventricular function, with LVEF greater than 55%, mild aortic stenosis with calculated aortic valve area 1.3 cm, peak velocity 2.18 m/s, peak gradient 19.1 mmHg, mean gradient 8.2 mmHg, with mild mitral regurgitation.   The patient has essential hypertension, blood pressure well controlled, on lisinopril -HCTZ and carvedilol , which are well-tolerated without apparent side effects.  The patient follows a low-sodium, no added salt diet.   The patient has hyperlipidemia, on simvastatin , which is well-tolerated without apparent side effects, followed by his primary care provider.  The patient follows a low-cholesterol, low-fat diet.   The patient has type 2 diabetes, diet controlled.  The patient  follows a low carbohydrate, ADA diet.  Past Medical and Surgical History  Past Medical History Past Medical History:  Diagnosis Date  . Acne   . Allergic rhinitis   . Anemia   . Anxiety   . Carpal tunnel syndrome   . Cervicalgia   . Degenerative disc disease     C-spine with LUE radiculopathy.   . Depression   . Gastritis   . GERD (gastroesophageal reflux disease)   . Hemorrhoids   . History of motion sickness   . Hyperlipidemia   . Hypertension   . Low back pain   . Moderate mitral regurgitation    LVH; diastolic dysfunction by echo 10/08.  SABRA Ringworm 2019  . Sleep apnea    cpap  . 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  . Type 2 diabetes mellitus without complication, without long-term current use of insulin (CMS/HHS-HCC) 01/31/2020    Past Surgical History He has a past surgical history that includes Septoplasty; Tonsillectomy; Adenoidectomy; Sigmoidoscopy Flexible (06/23/1993); egd (05/13/1993); egd (03/02/2005); Colonoscopy (03/02/2005); Colonoscopy (07/31/2015); bladder tumor; lumb spine fusion combined (N/A, 11/20/2017); anterior thoracic spine fusion multiple levels (N/A, 11/20/2017); arthrodesis anterior cervicle spine (N/A, 11/20/2017); instrumentation posterior spine 3 to 6 vertebral segments (N/A, 11/20/2017); microsurgery (N/A, 11/20/2017); L2-3 microdiscectomy (06/20/2018); removal of hardware lumbar spine (09/11/2019); L2-4 posterior fusion (10/02/2019); and nephrectomy left (Left, 05/15/2023).   Medications and Allergies  Current Medications  Current Outpatient Medications  Medication Sig Dispense Refill  . acetaminophen  (TYLENOL ) 500 MG tablet Take 500 mg by mouth every 8 (eight) hours as needed for Pain    . aspirin  81 MG EC tablet Take  1 tablet (81 mg total) by mouth once daily    . azelastine (ASTELIN) 137 mcg nasal spray Place 1 spray into both nostrils 2 (two) times daily  30 mL 1  . carvediloL  (COREG ) 3.125 MG tablet TAKE 1 TABLET TWICE DAILY  WITH MEALS 180 tablet 3  . cyanocobalamin  (VITAMIN B12) 1000 MCG tablet Take 1,000 mcg by mouth once daily    . docusate (COLACE) 100 MG capsule Take 100 mg by mouth once daily    . finasteride  (PROSCAR ) 5 mg tablet Take 5 mg by mouth once daily    . fluticasone  propionate (FLONASE ) 50 mcg/actuation nasal spray Place 1 spray into both nostrils 2 (two) times daily 16 g 0  . lisinopriL  (ZESTRIL ) 30 MG tablet Take 1 tablet (30 mg total) by mouth once daily 90 tablet 1  . MULTIVITAMIN ORAL Take 1 tablet by mouth once daily      . naproxen sodium (ALEVE) 220 MG tablet Take 1 tablet (220 mg total) by mouth 2 (two) times daily    . omeprazole (PRILOSEC) 20 MG DR capsule Take 20 mg by mouth once daily.    . simvastatin  (ZOCOR ) 40 MG tablet TAKE 1 TABLET ONCE DAILY 90 tablet 3  . sucralfate  (CARAFATE ) 1 gram tablet TAKE 1 TABLET THREE TIMES A DAY 270 tablet 4  . SYNTHROID  50 mcg tablet TAKE 1 TABLET ONCE DAILY ON AN EMPTY STOMACH WITH A GLASS OF WATER  AT LEAST 30-60 MINUTES BEFORE BREAKFAST 90 tablet 1  . tamsulosin  (FLOMAX ) 0.4 mg capsule Take 0.4 mg by mouth once daily    . traZODone  (DESYREL ) 100 MG tablet TAKE 1 TABLET NIGHTLY 90 tablet 3  . triamcinolone  0.1 % cream Apply topically 2 (two) times daily 45 g 0  . valACYclovir  (VALTREX ) 1000 MG tablet      No current facility-administered medications for this visit.    Allergies: Ceftriaxone , Doxycycline , and Nitrofurantoin   Social and Family History  Social History  reports that he has never smoked. He has never been exposed to tobacco smoke. He has never used smokeless tobacco. He reports that he does not drink alcohol and does not use drugs.  Family History Family History  Problem Relation Name Age of Onset  . Myocardial Infarction (Heart attack) Father      Review of Systems   Review of Systems: The patient denies chest pain, shortness of breath, orthopnea,  paroxysmal nocturnal dyspnea, pedal edema, palpitations, heart racing, presyncope, syncope. Review of 12 Systems is negative except as described above.  Physical Examination   Vitals:BP 132/80   Pulse 74   Ht 165.1 cm (5' 5)   Wt 78.5 kg (173 lb)   SpO2 98%   BMI 28.79 kg/m  Ht:165.1 cm (5' 5) Wt:78.5 kg (173 lb) ADJ:Anib surface area is 1.9 meters squared. Body mass index is 28.79 kg/m.  HEENT: Pupils equally reactive to light and accomodation  Neck: Supple without thyromegaly, carotid pulses 2+ Lungs: clear to auscultation bilaterally; no wheezes, rales, rhonchi Heart: Regular rate and rhythm.  No gallops, murmurs or rub Abdomen: soft nontender, nondistended, with normal bowel sounds Extremities: no cyanosis, clubbing, or edema Peripheral Pulses: 2+ in all extremities, 2+ femoral pulses bilaterally  Assessment   78 y.o. male with  1. Aortic stenosis, mild   2. Essential hypertension, benign   3. Systolic murmur   4. OSA on CPAP   5. Controlled type 2 diabetes mellitus with stage 3 chronic kidney disease, without long-term current use of insulin (  CMS/HHS-HCC)   6. Pure hypercholesterolemia    78 year old gentleman recently hospitalized for urosepsis complicated by an 8 beat episode of nonsustained ventricular tachycardia, currently on carvedilol .  Patient currently asymptomatic.  72-hour Holter monitor 10/05/2022 - 10/08/2022 revealed predominant sinus rhythm with occasional PACs.  2D echocardiogram 10/12/2022 revealed normal left ventricular function, with mild aortic stenosis and mild mitral regurgitation.  He patient has essential hypertension, blood pressure well controlled on current BP medications.     Plan   1.  Continue current medications 2.  Counseled patient about low-sodium diet 3.  DASH diet printed instructions given to the patient 4.  Counseled patient about low-cholesterol diet 5.  Continue simvastatin  for hyperlipidemia management 6.  Low-fat and  cholesterol diet printed instructions given to the patient 7.  Return to clinic for follow-up in 6 months  No orders of the defined types were placed in this encounter.   Return in about 6 months (around 12/03/2024).  MARSA DOOMS, MD PhD [facility]

## 2024-08-26 NOTE — Progress Notes (Unsigned)
   08/28/2024 3:34 PM   Trevor [name] 14-Jul-1946 [id number]  Cystoscopy Procedure Note:  Indication:  Hx of Left HG Ta UTUC, s/p left nephro-U in June 2024   - s/p prior neo-adj chemo  Hx of LGTa NMIBC- Right trigone/UO   - Last recurrence in Mar 2025   - Cysto in June 2025 - office fulguration at Right lateral bladder wall  After informed consent and discussion of the procedure and its risks, Trevor [name] was positioned and prepped in the standard fashion. Cystoscopy was performed with a flexible cystoscope. The urethra, bladder neck and bladder mucosa were visualized in a systematic fashion. The ureteral orifices were noted in orthotopic location and orientation. There were no bladder mucosal lesions, stones, debris or anatomic variants noted. The prostate gland was {bglistcystoprostatefindings:33375}.  Imaging: Recent {bglistimagingtype:33376} reviewed - no GU abnormalities noted  Findings: ***  Assessment and Plan: ***  [name], MD 08/26/2024

## 2024-08-27 ENCOUNTER — Other Ambulatory Visit: Payer: Self-pay

## 2024-08-27 DIAGNOSIS — R310 Gross hematuria: Secondary | ICD-10-CM

## 2024-08-28 ENCOUNTER — Other Ambulatory Visit: Admitting: Urology

## 2024-08-28 ENCOUNTER — Other Ambulatory Visit: Payer: Self-pay

## 2024-08-28 ENCOUNTER — Other Ambulatory Visit
Admission: RE | Admit: 2024-08-28 | Discharge: 2024-08-28 | Disposition: A | Discharge status: Home / Self Care | Attending: Urology | Admitting: Urology

## 2024-08-28 ENCOUNTER — Ambulatory Visit (INDEPENDENT_AMBULATORY_CARE_PROVIDER_SITE_OTHER): Admitting: Urology

## 2024-08-28 VITALS — BP 124/70 | HR 80

## 2024-08-28 DIAGNOSIS — R310 Gross hematuria: Secondary | ICD-10-CM | POA: Diagnosis present

## 2024-08-28 DIAGNOSIS — Z8551 Personal history of malignant neoplasm of bladder: Secondary | ICD-10-CM

## 2024-08-28 DIAGNOSIS — Z87442 Personal history of urinary calculi: Secondary | ICD-10-CM

## 2024-08-28 DIAGNOSIS — N35912 Unspecified bulbous urethral stricture, male: Secondary | ICD-10-CM

## 2024-08-28 DIAGNOSIS — N35919 Unspecified urethral stricture, male, unspecified site: Secondary | ICD-10-CM

## 2024-08-28 DIAGNOSIS — N401 Enlarged prostate with lower urinary tract symptoms: Secondary | ICD-10-CM

## 2024-08-28 LAB — URINALYSIS, COMPLETE (UACMP) WITH MICROSCOPIC
Bacteria, UA: NONE SEEN
Bilirubin Urine: NEGATIVE
Glucose, UA: NEGATIVE mg/dL
Ketones, ur: NEGATIVE mg/dL
Leukocytes,Ua: NEGATIVE
Nitrite: NEGATIVE
Protein, ur: NEGATIVE mg/dL
Specific Gravity, Urine: <1.005 — ABNORMAL LOW (ref 1.005–1.030)
Squamous Epithelial / HPF: NONE SEEN /HPF (ref 0–5)
WBC, UA: NONE SEEN WBC/hpf (ref 0–5)
pH: 5 (ref 5.0–8.0)

## 2024-08-28 NOTE — Progress Notes (Unsigned)
 Surgical Physician Order Form [facility] [place]  Dr. Glendia Barba, MD  * Scheduling expectation : Next Available (with Dr. Barba, patient preferred)  *Length of Case: 30 min  *Clearance needed: no  *Anticoagulation Instructions: Hold all anticoagulants  *Aspirin  Instructions: Hold Aspirin   *Post-op visit Date/Instructions:  3 day cath removal  *Diagnosis: urethral stricture, bladder cancer  *Procedure:  cystoscopy, dilation of urethral stricture, possible bladder biopsy vs TURBT, possible Right retrograde pyelogram    Additional orders: N/A  -Admit type: OUTpatient  -Anesthesia: Choice  -VTE Prophylaxis Standing Order SCD's       Other:   -Standing Lab Orders Per Anesthesia    Lab other: UA&Urine Culture  -Standing Test orders EKG/Chest x-ray per Anesthesia       Test other:   - Medications:  Ancef  2gm IV  -Other orders:  N/A

## 2024-08-28 NOTE — Addendum Note (Signed)
 Addended by: GEORGANNE PENNE SAUNDERS on: 08/28/2024 03:06 PM   Modules accepted: Orders

## 2024-08-29 NOTE — Progress Notes (Signed)
 Patient wishes to discuss surgery with Dr. Twylla first before proceeding with any procedure or scheduling. Patient will see Dr. Twylla in clinic on Wednesday 9/24 and will schedule with new orders afterwards.

## 2024-09-04 ENCOUNTER — Ambulatory Visit (INDEPENDENT_AMBULATORY_CARE_PROVIDER_SITE_OTHER): Admitting: Urology

## 2024-09-04 ENCOUNTER — Encounter: Payer: Self-pay | Admitting: Urology

## 2024-09-04 VITALS — BP 170/83 | HR 66 | Ht 67.0 in | Wt 175.0 lb

## 2024-09-04 DIAGNOSIS — Z8551 Personal history of malignant neoplasm of bladder: Secondary | ICD-10-CM

## 2024-09-04 NOTE — Progress Notes (Signed)
 09/04/2024 10:21 AM   Trevor [name] 1946-04-15 [id number]  Referring provider: Diedra Lame, MD [phone] [address] [facility] [place],  [place] [address]  Chief Complaint  Patient presents with   Other    HPI: Trevor [name] is a 78 y.o. 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  Recent cystoscopy found to have by Dr. 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  Was previously followed by Dr. Penne and I had seen him in a previous practice and requested follow-up with me.  He requested scheduling repeat cystoscopy to verify a stricture is present and he would be amenable to an office dilation rather than dilation in the OR.   PMH: Past Medical History:  Diagnosis Date   Acne    AKI (acute kidney injury)    Allergic rhinitis    Anemia    Anxiety    Aortic stenosis    a.) TTE 10/12/2022: mild AS (MPG 8.2); AVA (VTI) = 1.3 cm2   Bladder cancer (HCC)    a.) s/p robot assisted laparoscopic LEFT nephroureterectomy + intravesical gemcitabine   05/15/2023   BPH (benign prostatic hypertrophy) with urinary obstruction    CAD (coronary artery disease)    Carpal tunnel syndrome    Cervicalgia    Chronic calcific pancreatitis (HCC)    Chronic low back pain    Cirrhosis (HCC)    CKD (chronic kidney disease), stage III (HCC)    Degenerative disc disease, cervical    Depression    Diastolic dysfunction    a.) TTE 10/12/2022: EF >55%, mild LVH, mild BAE, mild RVE, triv AR/PR, mild MR/TR, G1DD   Diet-controlled type 2 diabetes mellitus (HCC)    Esophageal stricture    a.) s/p dilatation   Gastritis    GERD (gastroesophageal reflux disease)    Heart murmur    Hemorrhoids    History of malignant neoplasm of bladder    Hyperlipidemia    Hypertension    Hypokalemia    Hypospadias    Hypothyroidism    Lumbar spondylosis    a.) s/p L3-L5 XLIF, L5-S1 TLIF,  L3-S1 PSF 11/20/2017; b.) s/p lumbar laminectomy and decompression L2-L3 06/20/2018; c.) s/p L2-L3 XLIF 09/11/2019; d.) s/p posterior L2-L4 fusion 10/02/2019   Non-sustained ventricular tachycardia (HCC) 09/2022   a.) 8 beat episode of NSVT during admission in setting urosepsis   Sepsis (HCC)    Sleep apnea    a.) does not utilize nocturnal PAP therapy   Sleep difficulties    a.) on trazodone  PRN   Splenomegaly    Thrombocytopenia    Urinary frequency     Surgical History: Past Surgical History:  Procedure Laterality Date   ADENOIDECTOMY     ANTERIOR FUSION LUMBAR SPINE  11/20/2017   multiple levels; Dr. Clois   ANTERIOR LAT LUMBAR FUSION N/A 09/11/2019   Procedure: PRONE XLIF L2-3;  Surgeon: Clois Fret, MD;  Location: [facility] ORS;  Service: Neurosurgery;  Laterality: N/A;   BIOPSY Left 03/13/2023   Procedure: BIOPSY;  Surgeon: Penne Knee, MD;  Location: [facility];  Service: Urology;  Laterality: Left;   BLADDER INSTILLATION N/A 05/15/2023   Procedure: BLADDER INSTILLATION OF GEMCITABINE ;  Surgeon: Penne Knee, MD;  Location: [facility];  Service: Urology;  Laterality: N/A;   BLADDER INSTILLATION N/A 08/21/2023   Procedure: BLADDER INSTILLATION OF GEMCITABINE ;  Surgeon: Penne Knee, MD;  Location: [facility];  Service: Urology;  Laterality: N/A;   BLADDER SURGERY     2011, 2013, 2015   COLONOSCOPY WITH PROPOFOL  N/A 07/31/2015   Procedure: COLONOSCOPY WITH PROPOFOL ;  Surgeon: Lamar ONEIDA Holmes, MD;  Location: [facility];  Service: Endoscopy;  Laterality: N/A;   COLONOSCOPY, ESOPHAGOGASTRODUODENOSCOPY (EGD) AND ESOPHAGEAL DILATION  2006   CYSTOSCOPY W/ RETROGRADES Bilateral 11/11/2021   Procedure: CYSTOSCOPY WITH RETROGRADE PYELOGRAM;  Surgeon: Penne Knee, MD;  Location: [facility];  Service: Urology;  Laterality: Bilateral;   CYSTOSCOPY W/ RETROGRADES Bilateral 03/13/2023   Procedure: CYSTOSCOPY WITH RETROGRADE PYELOGRAM;  Surgeon: Penne Knee, MD;  Location: [facility]  ORS;  Service: Urology;  Laterality: Bilateral;   CYSTOSCOPY W/ RETROGRADES Left 04/10/2023   Procedure: CYSTOSCOPY WITH RETROGRADE PYELOGRAM;  Surgeon: Penne Knee, MD;  Location: [facility];  Service: Urology;  Laterality: Left;   CYSTOSCOPY W/ RETROGRADES Right 08/21/2023   Procedure: CYSTOSCOPY WITH RETROGRADE PYELOGRAM;  Surgeon: Penne Knee, MD;  Location: [facility];  Service: Urology;  Laterality: Right;   CYSTOSCOPY W/ URETERAL STENT PLACEMENT Left 04/10/2023   Procedure: CYSTOSCOPY WITH STENT EXCHANGE;  Surgeon: Penne Knee, MD;  Location: [facility];  Service: Urology;  Laterality: Left;   CYSTOSCOPY WITH BIOPSY N/A 11/11/2021   Procedure: CYSTOSCOPY WITH BLADDER BIOPSY;  Surgeon: Penne Knee, MD;  Location: [facility];  Service: Urology;  Laterality: N/A;   CYSTOSCOPY WITH BIOPSY N/A 03/13/2023   Procedure: CYSTOSCOPY WITH BLADDER BIOPSY;  Surgeon: Penne Knee, MD;  Location: [facility];  Service: Urology;  Laterality: N/A;   CYSTOSCOPY WITH STENT PLACEMENT Right 08/21/2023   Procedure: CYSTOSCOPY WITH STENT PLACEMENT;  Surgeon: Penne Knee, MD;  Location: [facility];  Service: Urology;  Laterality: Right;   FLEXIBLE SIGMOIDOSCOPY  1994   LUMBAR LAMINECTOMY/DECOMPRESSION MICRODISCECTOMY N/A 06/20/2018   Procedure: LUMBAR LAMINECTOMY/DECOMPRESSION MICRODISCECTOMY 1 LEVEL-L-2-3;  Surgeon: Clois Fret, MD;  Location: [facility];  Service: Neurosurgery;  Laterality: N/A;   POSTERIOR FUSION LUMBAR SPINE  10/02/2019   L2-4   POSTERIOR LUMBAR FUSION 4 WITH HARDWARE REMOVAL N/A 09/11/2019   Procedure: L2-S1 POSTERIOR FUSION, ABORTED;  Surgeon: Clois Fret, MD;  Location: [facility];  Service: Neurosurgery;  Laterality: N/A;   ROBOT ASSITED LAPAROSCOPIC NEPHROURETERECTOMY Left 05/15/2023   Procedure: XI ROBOT ASSITED LAPAROSCOPIC NEPHROURETERECTOMY;  Surgeon: Penne Knee, MD;  Location: [facility];  Service: Urology;  Laterality: Left;   SEPTOPLASTY  1996   TONSILLECTOMY      TRANSURETHRAL RESECTION OF BLADDER TUMOR N/A 08/21/2023   Procedure: TRANSURETHRAL RESECTION OF BLADDER TUMOR (TURBT);  Surgeon: Penne Knee, MD;  Location: [facility];  Service: Urology;  Laterality: N/A;   URETERAL BIOPSY Left 04/10/2023   Procedure: URETERAL BIOPSY WITH POSSIBLE ABLATION OF TUMOR;  Surgeon: Penne Knee, MD;  Location: [facility];  Service: Urology;  Laterality: Left;   URETEROSCOPY Left 03/13/2023   Procedure: URETEROSCOPY;  Surgeon: Penne Knee, MD;  Location: [facility];  Service: Urology;  Laterality: Left;   URETEROSCOPY Left 04/10/2023   Procedure: DIAGNOSTIC URETEROSCOPY;  Surgeon: Penne Knee, MD;  Location: [facility];  Service: Urology;  Laterality: Left;    Home Medications:  Allergies as of 09/04/2024       Reactions   Ceftriaxone  Hives   Tolerated 1st generation cephalosporin (CEFAZOLIN ) on 06/20/2018 and 09/11/2019 without documented ADRs. Previously tolerated amoxicillin  Blisters and Hives around mouth and neck.  Some blistering of lips.  Mouth not effected.  Patient started on ceftriaxone  and doxycyline 1-2 days prior to reaction.     Doxycycline  Hives  Blisters and Hives around mouth and neck.  Some blistering of lips.  Mouth not effected. Patient started on ceftriaxone  and doxycyline 1-2 days prior to reaction.     Macrobid  [nitrofurantoin ] Rash        Medication List        Accurate as of September 04, 2024 10:21 AM. If you have any questions, ask your nurse or doctor.          acetaminophen  650 MG CR tablet Commonly known as: TYLENOL  Take 650 mg by mouth every 8 (eight) hours as needed for pain.   aspirin  EC 81 MG tablet Take 81 mg by mouth daily.   carvedilol  3.125 MG tablet Commonly known as: COREG  Take 1 tablet (3.125 mg total) by mouth 2 (two) times daily with a meal.   cyanocobalamin  500 MCG tablet Commonly known as: VITAMIN B12 Take 500 mcg by mouth daily.   docusate sodium  100 MG capsule Commonly known as: COLACE Take  1 capsule (100 mg total) by mouth 2 (two) times daily. What changed:  when to take this reasons to take this   finasteride  5 MG tablet Commonly known as: PROSCAR  TAKE 1 TABLET EVERY MORNING   fluticasone  50 MCG/ACT nasal spray Commonly known as: FLONASE  Place 2 sprays into both nostrils daily as needed for allergies.   IRON PO Take 1 tablet by mouth as needed.   levothyroxine  25 MCG tablet Commonly known as: SYNTHROID  Take 25 mcg by mouth daily before breakfast.   lisinopril  20 MG tablet Commonly known as: ZESTRIL  Take 20 mg by mouth daily.   multivitamin with minerals Tabs tablet Take 1 tablet by mouth daily.   Omeprazole 20 MG Tbec Take 20 mg by mouth every morning.   OVER THE COUNTER MEDICATION Take 2 tablets by mouth daily. Arthritis/Joint supplement, Omega XL   simvastatin  40 MG tablet Commonly known as: ZOCOR  Take 40 mg by mouth every morning.   tamsulosin  0.4 MG Caps capsule Commonly known as: FLOMAX  TAKE 1 CAPSULE DAILY AFTER BREAKFAST   traZODone  100 MG tablet Commonly known as: DESYREL  Take 100 mg by mouth at bedtime.   VITAMIN D PO Take 1 tablet by mouth daily at 2 PM.        Allergies:  Allergies  Allergen Reactions   Ceftriaxone  Hives    Tolerated 1st generation cephalosporin (CEFAZOLIN ) on 06/20/2018 and 09/11/2019 without documented ADRs. Previously tolerated amoxicillin   Blisters and Hives around mouth and neck.  Some blistering of lips.  Mouth not effected.  Patient started on ceftriaxone  and doxycyline 1-2 days prior to reaction.     Doxycycline  Hives    Blisters and Hives around mouth and neck.  Some blistering of lips.  Mouth not effected. Patient started on ceftriaxone  and doxycyline 1-2 days prior to reaction.     Macrobid  [Nitrofurantoin ] Rash    Family History: Family History  Problem Relation Age of Onset   Hyperlipidemia Brother    Hyperlipidemia Sister    Heart attack Father     Social History:  reports that he has  never smoked. He has never been exposed to tobacco smoke. He has never used smokeless tobacco. He reports that he does not drink alcohol and does not use drugs.   Physical Exam: BP (!) 170/83   Pulse 66   Ht 5' 7 (1.702 m)   Wt 175 lb (79.4 kg)   BMI 27.41 kg/m   Constitutional:  Alert, No acute distress. HEENT: [place] AT Respiratory: Normal respiratory effort, no  increased work of breathing. Psychiatric: Normal mood and affect.   Assessment & Plan:   78 y.o. male with history of recurrent urothelial carcinoma the bladder and urothelial carcinoma of the left upper tract status post nephro ureterectomy Will schedule office cystoscopy with possible urethral dilation and surveillance cystoscopy per his request   Glendia JAYSON Barba, MD  [facility] [address] [place], [place] [address] [phone]

## 2024-09-10 ENCOUNTER — Emergency Department

## 2024-09-10 ENCOUNTER — Encounter: Payer: Self-pay | Admitting: Emergency Medicine

## 2024-09-10 ENCOUNTER — Emergency Department
Admission: EM | Admit: 2024-09-10 | Discharge: 2024-09-10 | Disposition: A | Discharge status: Home / Self Care | Attending: Emergency Medicine | Admitting: Emergency Medicine

## 2024-09-10 ENCOUNTER — Other Ambulatory Visit: Payer: Self-pay

## 2024-09-10 DIAGNOSIS — I6782 Cerebral ischemia: Secondary | ICD-10-CM | POA: Insufficient documentation

## 2024-09-10 DIAGNOSIS — R519 Headache, unspecified: Secondary | ICD-10-CM | POA: Insufficient documentation

## 2024-09-10 DIAGNOSIS — I444 Left anterior fascicular block: Secondary | ICD-10-CM | POA: Diagnosis not present

## 2024-09-10 DIAGNOSIS — H539 Unspecified visual disturbance: Secondary | ICD-10-CM | POA: Insufficient documentation

## 2024-09-10 DIAGNOSIS — N189 Chronic kidney disease, unspecified: Secondary | ICD-10-CM | POA: Diagnosis not present

## 2024-09-10 DIAGNOSIS — I6522 Occlusion and stenosis of left carotid artery: Secondary | ICD-10-CM | POA: Diagnosis not present

## 2024-09-10 LAB — CBC
HCT: 36.4 % — ABNORMAL LOW (ref 39.0–52.0)
Hemoglobin: 11.6 g/dL — ABNORMAL LOW (ref 13.0–17.0)
MCH: 28.9 pg (ref 26.0–34.0)
MCHC: 31.9 g/dL (ref 30.0–36.0)
MCV: 90.5 fL (ref 80.0–100.0)
Platelets: 103 K/uL — ABNORMAL LOW (ref 150–400)
RBC: 4.02 MIL/uL — ABNORMAL LOW (ref 4.22–5.81)
RDW: 12.6 % (ref 11.5–15.5)
WBC: 5.9 K/uL (ref 4.0–10.5)
nRBC: 0 % (ref 0.0–0.2)

## 2024-09-10 LAB — PROTIME-INR
INR: 1.2 (ref 0.8–1.2)
Prothrombin Time: 15.3 s — ABNORMAL HIGH (ref 11.4–15.2)

## 2024-09-10 LAB — COMPREHENSIVE METABOLIC PANEL WITH GFR
ALT: 20 U/L (ref 0–44)
AST: 36 U/L (ref 15–41)
Albumin: 3.8 g/dL (ref 3.5–5.0)
Alkaline Phosphatase: 69 U/L (ref 38–126)
Anion gap: 16 — ABNORMAL HIGH (ref 5–15)
BUN: 18 mg/dL (ref 8–23)
CO2: 19 mmol/L — ABNORMAL LOW (ref 22–32)
Calcium: 9.6 mg/dL (ref 8.9–10.3)
Chloride: 105 mmol/L (ref 98–111)
Creatinine, Ser: 1.23 mg/dL (ref 0.61–1.24)
GFR, Estimated: >60 mL/min (ref >60)
Glucose, Bld: 150 mg/dL — ABNORMAL HIGH (ref 70–99)
Potassium: 4.5 mmol/L (ref 3.5–5.1)
Sodium: 140 mmol/L (ref 135–145)
Total Bilirubin: 0.7 mg/dL (ref 0.0–1.2)
Total Protein: 6.8 g/dL (ref 6.5–8.1)

## 2024-09-10 LAB — DIFFERENTIAL
Abs Immature Granulocytes: 0.02 K/uL (ref 0.00–0.07)
Basophils Absolute: 0 K/uL (ref 0.0–0.1)
Basophils Relative: 1 %
Eosinophils Absolute: 0.1 K/uL (ref 0.0–0.5)
Eosinophils Relative: 2 %
Immature Granulocytes: 0 %
Lymphocytes Relative: 15 %
Lymphs Abs: 0.9 K/uL (ref 0.7–4.0)
Monocytes Absolute: 0.4 K/uL (ref 0.1–1.0)
Monocytes Relative: 7 %
Neutro Abs: 4.5 K/uL (ref 1.7–7.7)
Neutrophils Relative %: 75 %

## 2024-09-10 LAB — SEDIMENTATION RATE: Sed Rate: 37 mm/h — ABNORMAL HIGH (ref 0–20)

## 2024-09-10 LAB — APTT: aPTT: 26 s (ref 24–36)

## 2024-09-10 LAB — ETHANOL: Alcohol, Ethyl (B): <15 mg/dL (ref <15)

## 2024-09-10 MED ORDER — SODIUM CHLORIDE 0.9 % IV BOLUS
1000.0000 mL | Freq: Once | INTRAVENOUS | Status: AC
  Administered 2024-09-10: 1000 mL via INTRAVENOUS

## 2024-09-10 MED ORDER — METOCLOPRAMIDE HCL 5 MG/ML IJ SOLN
10.0000 mg | Freq: Once | INTRAMUSCULAR | Status: AC
  Administered 2024-09-10: 10 mg via INTRAVENOUS
  Filled 2024-09-10: qty 2

## 2024-09-10 MED ORDER — ACETAMINOPHEN 500 MG PO TABS
1000.0000 mg | ORAL_TABLET | Freq: Once | ORAL | Status: AC
  Administered 2024-09-10: 1000 mg via ORAL
  Filled 2024-09-10: qty 2

## 2024-09-10 NOTE — ED Notes (Signed)
 Pt gone to CT

## 2024-09-10 NOTE — ED Notes (Signed)
 Pt verbalizes understanding of discharge instructions. Opportunity for questioning and answers were provided. Pt informed to pick up belongings at security desk in ED lobby. Pt discharged from ED.

## 2024-09-10 NOTE — Progress Notes (Signed)
 Trevor [name] is a 78 y.o. male who is here for a follow up visit  DM: He is not currently taking medicine. His A1C was 5.8 on recent labs.  HTN: He is taking carvedilol  and lisinopril . He notes that his systolic blood pressure has been as high as 150 at times when he checks.  Hyperlipidemia: He is taking simvastatin  and he tolerates this well. His LDL was 81 on recent labs.  CKD: He is taking lisinopril  for kidney protection and his urine microalbumin/Cr ratio is normal. His Cr was 1.3 on recent labs.  Patient Active Problem List  Diagnosis  . Essential hypertension, benign  . Hyperlipidemia, unspecified  . Low back pain  . Anemia, unspecified  . Elevated blood sugar, unspecified  . Enlarged prostate without lower urinary tract symptoms (luts)  . Depression  . Lumbar radiculopathy  . Prediabetes  . Obesity (BMI 30-39.9), unspecified  . Gastroesophageal reflux disease without esophagitis  . OSA on CPAP  . History of bladder cancer  . Thrombocytopenia ()  . Spinal stenosis of lumbar region with neurogenic claudication  . Constipation due to pain medication  . Wound infection  . Wound dehiscence, surgical, initial encounter  . Decreased range of motion  . Knee buckling, unspecified laterality  . Controlled type 2 diabetes mellitus with stage 3 chronic kidney disease, without long-term current use of insulin (CMS/HHS-HCC)  . Ventricular tachycardia, non-sustained (CMS/HHS-HCC)  . Systolic murmur  . AKI (acute kidney injury) ()  . Malignant neoplasm of lateral wall of urinary bladder (CMS/HHS-HCC)  . S/P lumbar fusion  . Aortic stenosis, mild    Past Medical History:  Diagnosis Date  . Acne   . Allergic rhinitis   . Anemia   . Anxiety   . Carpal tunnel syndrome   . Cervicalgia   . Degenerative disc disease     C-spine with LUE radiculopathy.   . Depression   . Gastritis   . GERD (gastroesophageal reflux disease)   . Hemorrhoids   . History of motion sickness   .  Hyperlipidemia   . Hypertension   . Low back pain   . Moderate mitral regurgitation    LVH; diastolic dysfunction by echo 10/08.  [facility] Ringworm 2019  . Sleep apnea    cpap  . 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  . Type 2 diabetes mellitus without complication, without long-term current use of insulin (CMS/HHS-HCC) 01/31/2020     Current Outpatient Medications:  .  acetaminophen  (TYLENOL ) 500 MG tablet, Take 500 mg by mouth every 8 (eight) hours as needed for Pain, Disp: , Rfl:  .  aspirin  81 MG EC tablet, Take 1 tablet (81 mg total) by mouth once daily, Disp: , Rfl:  .  azelastine (ASTELIN) 137 mcg nasal spray, Place 1 spray into both nostrils 2 (two) times daily, Disp: 30 mL, Rfl: 1 .  carvediloL  (COREG ) 3.125 MG tablet, TAKE 1 TABLET TWICE DAILY  WITH MEALS, Disp: 180 tablet, Rfl: 3 .  docusate (COLACE) 100 MG capsule, Take 100 mg by mouth once daily, Disp: , Rfl:  .  finasteride  (PROSCAR ) 5 mg tablet, Take 5 mg by mouth once daily, Disp: , Rfl:  .  fluticasone  propionate (FLONASE ) 50 mcg/actuation nasal spray, Place 1 spray into both nostrils 2 (two) times daily, Disp: 16 g, Rfl: 0 .  levothyroxine  (SYNTHROID ) 50 MCG tablet, TAKE 1 TABLET ONCE DAILY ON AN  EMPTY STOMACH WITH A GLASS OF WATER  ATLEAST 30-60 MINUTES BEFORE BREAKFAST, Disp: 90 tablet, Rfl: 0 .  lisinopriL  (ZESTRIL ) 30 MG tablet, TAKE 1 TABLET ONCE DAILY, Disp: 90 tablet, Rfl: 1 .  MULTIVITAMIN ORAL, Take 1 tablet by mouth once daily  , Disp: , Rfl:  .  omeprazole (PRILOSEC) 20 MG DR capsule, Take 20 mg by mouth once daily., Disp: , Rfl:  .  simvastatin  (ZOCOR ) 40 MG tablet, TAKE 1 TABLET ONCE DAILY, Disp: 90 tablet, Rfl: 3 .  sucralfate  (CARAFATE ) 1 gram tablet, TAKE 1 TABLET THREE TIMES A DAY, Disp: 270 tablet, Rfl: 4 .  tamsulosin  (FLOMAX ) 0.4 mg capsule, Take 0.4 mg by mouth once daily, Disp: , Rfl:  .  traZODone  (DESYREL ) 100 MG tablet,  TAKE 1 TABLET NIGHTLY, Disp: 90 tablet, Rfl: 3 .  triamcinolone  0.1 % cream, Apply topically 2 (two) times daily, Disp: 45 g, Rfl: 0 .  valACYclovir  (VALTREX ) 1000 MG tablet, , Disp: , Rfl:  .  cyanocobalamin  (VITAMIN B12) 1000 MCG tablet, Take 1,000 mcg by mouth once daily, Disp: , Rfl:  .  naproxen sodium (ALEVE) 220 MG tablet, Take 1 tablet (220 mg total) by mouth 2 (two) times daily, Disp: , Rfl:   Allergies  Allergen Reactions  . Ceftriaxone  Other (See Comments) and Hives    Severe rash/sores in and around mouth Blisters and Hives around mouth and neck.  Some blistering of lips.  Mouth not effected.  Patient started on ceftriaxone  and doxycyline 1-2 days prior to reaction.    . Doxycycline  Hives and Itching    Blisters and Hives around mouth and neck.  Some blistering of lips.  Mouth not effected. Patient started on ceftriaxone  and doxycyline 1-2 days prior to reaction.    . Nitrofurantoin  Rash    Results for orders placed or performed in visit on 09/03/24  CBC w/auto Differential (5 Part)  Result Value Ref Range   WBC (White Blood Cell Count) 4.0 (L) 4.1 - 10.2 10^3/uL   RBC (Red Blood Cell Count) 3.68 (L) 4.69 - 6.13 10^6/uL   Hemoglobin 10.7 (L) 14.1 - 18.1 gm/dL   Hematocrit 66.4 (L) 59.9 - 52.0 %   MCV (Mean Corpuscular Volume) 91.0 80.0 - 100.0 fl   MCH (Mean Corpuscular Hemoglobin) 29.1 27.0 - 31.2 pg   MCHC (Mean Corpuscular Hemoglobin Concentration) 31.9 (L) 32.0 - 36.0 gm/dL   Platelet Count 94 (L) 150 - 450 10^3/uL   RDW-CV (Red Cell Distribution Width) 12.4 11.6 - 14.8 %   MPV (Mean Platelet Volume) 10.1 9.4 - 12.4 fl   Neutrophils 2.69 1.50 - 7.80 10^3/uL   Lymphocytes 0.75 (L) 1.00 - 3.60 10^3/uL   Monocytes 0.36 0.00 - 1.50 10^3/uL   Eosinophils 0.16 0.00 - 0.55 10^3/uL   Basophils 0.03 0.00 - 0.09 10^3/uL   Neutrophil % 67.1 32.0 - 70.0 %   Lymphocyte % 18.8 10.0 - 50.0 %   Monocyte % 9.0 4.0 - 13.0 %   Eosinophil % 4.0 1.0 - 5.0 %   Basophil% 0.8 0.0 - 2.0  %   Immature Granulocyte % 0.3 <=0.7 %   Immature Granulocyte Count 0.01 <=0.06 10^3/L  Comprehensive Metabolic Panel (CMP)  Result Value Ref Range   Glucose 112 (H) 70 - 110 mg/dL   Sodium 860 863 - 854 mmol/L   Potassium 4.6 3.6 - 5.1 mmol/L   Chloride 105 97 - 109 mmol/L   Carbon Dioxide (CO2) 28.2 22.0 - 32.0 mmol/L  Urea Nitrogen (BUN) 21 7 - 25 mg/dL   Creatinine 1.3 0.7 - 1.3 mg/dL   Glomerular Filtration Rate (eGFR) 56 (L) >60 mL/min/1.73sq m   Calcium 9.1 8.7 - 10.3 mg/dL   AST  29 8 - 39 U/L   ALT  16 6 - 57 U/L   Alk Phos (alkaline Phosphatase) 84 34 - 104 U/L   Albumin 3.9 3.5 - 4.8 g/dL   Bilirubin, Total 0.3 0.3 - 1.2 mg/dL   Protein, Total 6.2 6.1 - 7.9 g/dL   A/G Ratio 1.7 1.0 - 5.0 gm/dL  Hemoglobin J8R  Result Value Ref Range   Hemoglobin A1C 5.8 (H) 4.2 - 5.6 %   Average Blood Glucose (Calc) 120 mg/dL   Narrative   Normal Range:    4.2 - 5.6% Increased Risk:  5.7 - 6.4% Diabetes:        >= 6.5% Glycemic Control for adults with diabetes:  <7%    Lipid Panel w/calc LDL  Result Value Ref Range   Cholesterol, Total 174 100 - 200 mg/dL   Triglyceride 730 (H) 35 - 199 mg/dL   HDL (High Density Lipoprotein) Cholesterol 39.5 29.0 - 71.0 mg/dL   LDL Calculated 81 0 - 130 mg/dL   VLDL Cholesterol 54 mg/dL   Cholesterol/HDL Ratio 4.4   Microalbumin/Creatinine Ratio, Random Urine  Result Value Ref Range   Creatinine, Random Urine 78.9 40.0 - 300.0 mg/dL   Urine Albumin, Random <7   mg/L   Urine Albumin/Creatinine Ratio <8.9 <30.0 ug/mg  Thyroid Stimulating-Hormone (TSH)  Result Value Ref Range   Thyroid Stimulating Hormone (TSH) 2.893 0.450-5.330 uIU/ml uIU/mL     Review of Systems  No fever, chills, nausea, or vomiting   Exam  Vitals:   09/10/24 0833  BP: 138/84  Pulse: 68  SpO2: 99%  Weight: 78.5 kg (173 lb)  Height: 165.1 cm (5' 5)    Body mass index is 28.79 kg/m.  General: Patient is well-groomed, well-nourished, appears stated age in  no acute distress  HEENT: head is atraumatic and normocephalic, neck is supple with no palpable nodules  CV: Regular rhythm and normal heart rate, no murmur, no JVD  Respiratory: Clear to auscultation throughout lung fields with no wheezing, crackles, or rhonchi. No increased work of breathing   Impression/Plan  DM: I discussed limiting carbohydrate intake to 200 grams per day or less.  A1C is at goal. He has been having some headaches and pressure behind his left eye. He has noticed some vision changes. I recommended that he contact Optho for this. He is followed by Dr. Myrna.   Hyperlipidemia: Patient is compliant with medicine and tolerates it well. LDL is at goal on recent labs.  HTN: I will increase his lisinopril  from 30 mg to 40 mg as a next step.  CKD: His Cr is stable.  Chronic anemia: His hemoglobin has been stable.   Hypothyroid: His TSH is at goal.   Bladder cancer: he has not seen any blood in his bladder. He is followed by Dr. Twylla now that Dr. Penne has moved.  This visit was part of longitudinal care for this patient involving long-term management of chronic medical conditions.

## 2024-09-10 NOTE — ED Provider Notes (Signed)
-----------------------------------------   4:52 PM on 09/10/2024 -----------------------------------------  I took over care of this patient from Dr. Waymond.  MRI and MRA are negative.  The patient is stable for discharge at this time.  On reassessment he is comfortable appearing and feels ready to go home.  I gave strict return precautions and he expressed understanding.   Jacolyn Pae, MD 09/10/24 [phone]

## 2024-09-10 NOTE — ED Notes (Signed)
 Patient transported to MRI

## 2024-09-10 NOTE — ED Provider Notes (Signed)
 [name] Provider Note    Event Date/Time   First MD Initiated Contact with Patient 09/10/24 1157     (approximate)   History   Headache   HPI  Trevor [name] is a 78 y.o. male with history of anemia, cirrhosis, CKD, chronic back pain, presenting with left-sided headaches for the last 2 weeks, states that intermittently he would noticed that his left eye will get slightly blurry, would blink several times and that would resolve the blurry vision.  He denies any double vision, wears corrective lenses.  No neck stiffness or pain.  He denies any focal weakness or numbness to the rest of his body, no slurred speech or facial droop.  He denies prior history of stroke.  Went to see his ophthalmologist today who did a workup, states no ocular pathology that is causing his symptoms, sent him in for stroke workup.  On independent review of the note that he came in from ophthalmology's office, they were concerned about the headache with transient vision changes, wanted MRI, MRA, CBC, CMP, ESR, CRP.     Physical Exam   Triage Vital Signs: ED Triage Vitals  Encounter Vitals Group     BP 09/10/24 1118 (!) 127/94     Girls Systolic BP Percentile --      Girls Diastolic BP Percentile --      Boys Systolic BP Percentile --      Boys Diastolic BP Percentile --      Pulse Rate 09/10/24 1118 76     Resp 09/10/24 1118 17     Temp 09/10/24 1118 98.3 F (36.8 C)     Temp Source 09/10/24 1118 Oral     SpO2 09/10/24 1118 97 %     Weight 09/10/24 1120 174 lb (78.9 kg)     Height 09/10/24 1120 5' 7 (1.702 m)     Head Circumference --      Peak Flow --      Pain Score 09/10/24 1119 4     Pain Loc --      Pain Education --      Exclude from Growth Chart --     Most recent vital signs: Vitals:   09/10/24 1540 09/10/24 1600  BP: (!) 107/90 123/83  Pulse: 60 (!) 52  Resp: (!) 42 16  Temp:    SpO2: 100% 100%     General: Awake, no distress.  CV:  Good  peripheral perfusion.  Resp:  Normal effort.  Abd:  No distention.  Other:  Equal, extraocular movements are intact, no cranial nerve deficits, no slurred speech, no focal weakness or numbness, no temporal tenderness   ED Results / Procedures / Treatments   Labs (all labs ordered are listed, but only abnormal results are displayed) Labs Reviewed  PROTIME-INR - Abnormal; Notable for the following components:      Result Value   Prothrombin Time 15.3 (*)    All other components within normal limits  CBC - Abnormal; Notable for the following components:   RBC 4.02 (*)    Hemoglobin 11.6 (*)    HCT 36.4 (*)    Platelets 103 (*)    All other components within normal limits  COMPREHENSIVE METABOLIC PANEL WITH GFR - Abnormal; Notable for the following components:   CO2 19 (*)    Glucose, Bld 150 (*)    Anion gap 16 (*)    All other components within normal limits  SEDIMENTATION RATE -  Abnormal; Notable for the following components:   Sed Rate 37 (*)    All other components within normal limits  APTT  DIFFERENTIAL  ETHANOL  C-REACTIVE PROTEIN  I-STAT CREATININE, ED  CBG MONITORING, ED     EKG  EKG shows, sinus rhythm with premature supraventricular complexes, rate 67, normal QS, normal QTc, no obvious ischemic ST elevation,   RADIOLOGY On my independent interpretation, CT head without obvious intracranial hemorrhage   PROCEDURES:  Critical Care performed: No  Procedures   MEDICATIONS ORDERED IN ED: Medications  acetaminophen  (TYLENOL ) tablet 1,000 mg (1,000 mg Oral Given 09/10/24 1331)  metoCLOPramide  (REGLAN ) injection 10 mg (10 mg Intravenous Given 09/10/24 1332)  sodium chloride  0.9 % bolus 1,000 mL (1,000 mLs Intravenous New Bag/Given 09/10/24 1330)     IMPRESSION / MDM / ASSESSMENT AND PLAN / ED COURSE  I reviewed the triage vital signs and the nursing notes.                              Differential diagnosis includes, but is not limited to, cluster  headache, migraine, tension headache, did consider TIA, he has no focal deficits at this time to suggest CVA.  Did consider temporal arteritis but patient has no temporal tenderness, no persistent vision changes.  Will get labs, MRI, MRA.  Patient's presentation is most consistent with acute presentation with potential threat to life or bodily function.  Independent interpretation of labs and imaging below.  Patient signed out pending MRI results, labs are reassuring, ESR is mildly elevated but this is consistent compared to prior.  If MRI negative, likely able to be discharged with outpatient neuro follow-up.  The patient is on the cardiac monitor to evaluate for evidence of arrhythmia and/or significant heart rate changes.   Clinical Course as of 09/10/24 1605  Tue Sep 10, 2024  1320 CT HEAD WO CONTRAST MPRESSION: No acute intracranial abnormality. Normal for age noncontrast Head CT.   [TT]  1320 Independent review of labs, ethanol levels are not elevated, electrolytes not severely deranged, LFTs are normal, no leukocytosis. [TT]    Clinical Course User Index [TT] Waymond, Lorelle Cummins, MD     FINAL CLINICAL IMPRESSION(S) / ED DIAGNOSES   Final diagnoses:  Nonintractable headache, unspecified chronicity pattern, unspecified headache type  Vision changes     Rx / DC Orders   ED Discharge Orders     None        Note:  This document was prepared using Dragon voice recognition software and may include unintentional dictation errors.    Waymond Lorelle Cummins, MD 09/10/24 [phone]

## 2024-09-10 NOTE — ED Notes (Signed)
 MD at bedside.

## 2024-09-10 NOTE — ED Triage Notes (Signed)
 Patient to ED from eye doctor for left sided headache- behind eye. Ongoing x2 weeks. Sent for stroke w/u. Denies weakness or numbness. Had eye dilated at eye doctor- states no vision changes prior to that.

## 2024-10-01 NOTE — ED Provider Notes (Signed)
 [facility] Emergency Department Provider Note   HPI    History of Present Illness Trevor [name] is a 78 year old male who presents after a fall at a gas station.  He experienced a fall earlier today, stumbling and hitting his left side. He attributes the fall to back issues and was not using his cane at the time. No loss of consciousness, chest pain, or shortness of breath occurred prior to the fall. He was able to get up and walk to the ambulance without assistance. He has a scratch on his nose, a puffy lip, and discomfort from his glasses. His medications include those for blood pressure and atrial fibrillation, but he is not on anticoagulation therapy. He denies the need for pain medication.   MDM   BP 155/77   Pulse 61   Temp 36.4 C (97.5 F) (Oral)   Resp 17   SpO2 99%    Hemodynamically stable. On exam, patient is overall well-appearing in no acute distress.  Regular rate and rhythm, no increased work of breathing abdomen nondistended 2+ pulses in distal extremities, no leg swelling.  Bruising over the left orbit, nose, with small deformity noted to the nose, embedded foreign body over the left ocular rim, without eye involvement, no pain in the eye, some swelling to the left lower lip, without laceration. Ddx: Patient presenting with a fall, expect this is likely mechanical, nonsyncopal as he had no prodromal symptoms and remembers all of the events leading up to the fall, remembers stumbling, not using his cane as he normally would.  Will plan for evaluation of potential traumatic injury and likely foreign body removal.  Diagnostic workup as below. Disposition pending further work-up. See progress notes below.   Orders Placed This Encounter  Procedures  . FOREIGN BODY REMOVAL  . CT Head Wo Contrast  . CT Maxillofacial Wo Contrast  . CT Cervical Spine Wo Contrast  . Ambulatory referral to ENT    ED Course as of 10/01/24 1348  Tue Oct 01, 2024  1015 CT Cervical Spine Wo  Contrast IMPRESSION:   No acute fracture or traumatic listhesis of the cervical spine.   Asymmetric atlantoaxial joints, likely positional.   Degenerative cervical changes as described.     1015 CT Maxillofacial Wo Contrast IMPRESSION:   Nasal bone tip fracture with mild right angulation and minimal displacement. No other fractures. No foreign body.   1015 CT Head Wo Contrast IMPRESSION:   No traumatic intracranial abnormality.   Left periorbital subcutaneous laceration and retained foreign body.   Partially visualized comminuted bilateral nasal bone fracture which will be separately dictated on concurrently obtained CT of the face.   1055 Foreign body was removed from patient's face, tolerated well after some topical lidocaine , without significant overlying tissue defect, will defer laceration repair at this time.  He is agreeable with plans for discharge, provided return precautions    The case was discussed with the attending physician who is in agreement with the above assessment and plan.  - Any discussion of this patient's case/presentation between myself and consultants, admitting teams, or other team members has been documented above. - Imaging and other studies: N/A - External records reviewed: None - Consideration of admission, observation, transfer, or escalation of care: No   ED Clinical Impression   Final diagnoses:  Fall, initial encounter (Primary)  Closed fracture of nasal bone, initial encounter     Physical Exam   Vitals:   10/01/24 0800 10/01/24 0801 10/01/24  1100  BP: 179/74 179/74 155/77  Pulse: 61 56 61  Resp: 16 18 17   Temp:  36.6 C (97.9 F) 36.4 C (97.5 F)  TempSrc:  Oral Oral  SpO2: 98% 99% 99%     Constitutional: In no acute distress. Eyes: Conjunctivae are normal. EOMI.  HEENT: Normocephalic and atraumatic. Mucous membranes are moist.  Additional exam as noted above Neck: Full active range of motion Cardiovascular: See heart rate  listed above.  Normal skin perfusion.  2+ pulses in distal extremities, no lower extremity edema.  Respiratory: See respiratory rate listed above.   Speaking easily in full sentences Gastrointestinal: Soft, non-distended, non-tender.  No guarding. Musculoskeletal: No long bone deformities.  Neurologic: Normal speech and language. No gross focal neurologic deficits are appreciated.  Skin: Skin is warm, dry additional exam as noted above Psychiatric: Mood and affect are normal.   Past History   PAST MEDICAL HISTORY/PAST SURGICAL HISTORY:  Past Medical History[1]  Past Surgical History[2]  MEDICATIONS:  No current facility-administered medications for this encounter.  Current Outpatient Medications:  .  acyclovir  (ZOVIRAX ) 400 MG tablet, Take 400 mg by mouth., Disp: , Rfl:  .  amoxicillin  (AMOXIL ) 875 MG tablet, , Disp: , Rfl:  .  aspirin  (ADULT LOW DOSE ASPIRIN ) 81 MG tablet, Take 81 mg by mouth. Frequency:QD   Dosage:81   MG  Instructions:  Note:Dose: 81MG , Disp: , Rfl:  .  aspirin  (ECOTRIN) 81 MG tablet, Take 81 mg by mouth., Disp: , Rfl:  .  aspirin  (ECOTRIN) 81 MG tablet, Take 81 mg by mouth., Disp: , Rfl:  .  b complex vitamins capsule, Take 1 capsule by mouth., Disp: , Rfl:  .  cetirizine (ZYRTEC) 10 MG tablet, Take 10 mg by mouth., Disp: , Rfl:  .  clobetasol (TEMOVATE) 0.05 % ointment, Apply to eczema on hands up to twice daily as needed., Disp: 30 g, Rfl: 5 .  fluticasone  (FLONASE ) 50 mcg/actuation nasal spray, , Disp: , Rfl:  .  fluticasone  (FLONASE ) 50 mcg/actuation nasal spray, 2 sprays into each nostril., Disp: , Rfl:  .  ibuprofen (ADVIL,MOTRIN) 600 MG tablet, , Disp: , Rfl:  .  ibuprofen (ADVIL,MOTRIN) 600 MG tablet, Take 600 mg by mouth., Disp: , Rfl:  .  lisinopril -hydrochlorothiazide  (PRINZIDE ,ZESTORETIC ) 10-12.5 mg per tablet, , Disp: , Rfl:  .  LISINOPRIL -HYDROCHLOROTHIAZIDE  ORAL, Take 5 mg by mouth., Disp: , Rfl:  .  multivitamin (TAB-A-VITE/THERAGRAN) per tablet,  Take by mouth., Disp: , Rfl:  .  multivitamin,tx-minerals (VITAMINS AND MINERALS) Tab, Take 1 tablet by mouth., Disp: , Rfl:  .  mv-mn-herbal #208-b-sitosterol (URINOZINC PROSTATE FORMULA) 100 mg Tab, Take by mouth., Disp: , Rfl:  .  omeprazole (PRILOSEC OTC) 20 MG tablet, Take 20 mg by mouth., Disp: , Rfl:  .  omeprazole (PRILOSEC) 20 MG capsule, Take 20 mg by mouth., Disp: , Rfl:  .  simvastatin  (ZOCOR ) 40 MG tablet, , Disp: , Rfl:  .  simvastatin  (ZOCOR ) 40 MG tablet, Take 40 mg by mouth., Disp: , Rfl:  .  simvastatin  (ZOCOR ) 40 MG tablet, , Disp: , Rfl:  .  sucralfate  (CARAFATE ) 1 gram tablet, Take 1.5 g by mouth., Disp: , Rfl:  .  sucralfate  (CARAFATE ) 1 gram tablet, Take 1 g by mouth., Disp: , Rfl:  .  tamsulosin  (FLOMAX ) 0.4 mg capsule, , Disp: , Rfl:  .  tamsulosin  (FLOMAX ) 0.4 mg capsule, , Disp: , Rfl:  .  tamsulosin  (FLOMAX ) 0.4 mg capsule, Take 0.4 mg by mouth.,  Disp: , Rfl:   ALLERGIES:  Ceftriaxone , Doxycycline , and Nitrofurantoin   SOCIAL HISTORY:  Social History   Tobacco Use  . Smoking status: Never  . Smokeless tobacco: Never  Substance Use Topics  . Alcohol use: No    FAMILY HISTORY: Family History[3]    Radiology   CT Head Wo Contrast  Final Result    No traumatic intracranial abnormality.    Left periorbital subcutaneous laceration and retained foreign body.    Partially visualized comminuted bilateral nasal bone fracture which will be separately dictated on concurrently obtained CT of the face.        CT Maxillofacial Wo Contrast  Final Result    Nasal bone tip fracture with mild right angulation and minimal displacement. No other fractures. No foreign body.        CT Cervical Spine Wo Contrast  Final Result    No acute fracture or traumatic listhesis of the cervical spine.    Asymmetric atlantoaxial joints, likely positional.    Degenerative cervical changes as described.         Laboratory Data   No results found for: WBC,  HGB, HCT, PLT  No results found for: NA, K, CL, CO2, BUN, CREATININE, GLU, CALCIUM, MG, PHOS  No results found for: BILITOT, BILIDIR, PROT, ALBUMIN, ALT, AST, ALKPHOS, GGT  No results found for: LABPROT, INR, APTT  Morene Ates DO PGY3 EM  Portions of this record have been created using Scientist, clinical (histocompatibility and immunogenetics). Dictation errors have been sought, but may not have been identified and corrected.        [1] Past Medical History: Diagnosis Date  . A-fib (CMS-HCC)   . Hypertrophy of prostate without urinary obstruction and other lower urinary tract symptoms (LUTS)   . Hypospadias   . Microscopic hematuria   . Neoplasm of uncertain behavior of bladder   . Personal history of malignant neoplasm of bladder   . Urinary frequency   [2] Past Surgical History: Procedure Laterality Date  . SEPTOPLASTY    . TONSILLECTOMY    . TURBT  09/18/2013  [3] Family History Problem Relation Age of Onset  . Hyperlipidemia Brother   . Melanoma Brother   . Hyperlipidemia Sister    Ates Morene Maude Sheppard, DO Resident 10/01/24 [phone]

## 2024-10-09 ENCOUNTER — Ambulatory Visit (INDEPENDENT_AMBULATORY_CARE_PROVIDER_SITE_OTHER): Admitting: Urology

## 2024-10-09 ENCOUNTER — Encounter: Payer: Self-pay | Admitting: Urology

## 2024-10-09 VITALS — BP 181/78 | HR 83 | Ht 67.0 in | Wt 174.0 lb

## 2024-10-09 DIAGNOSIS — Z8551 Personal history of malignant neoplasm of bladder: Secondary | ICD-10-CM

## 2024-10-09 LAB — URINALYSIS, COMPLETE
Bilirubin, UA: NEGATIVE
Glucose, UA: NEGATIVE
Ketones, UA: NEGATIVE
Leukocytes,UA: NEGATIVE
Nitrite, UA: NEGATIVE
Protein,UA: NEGATIVE
RBC, UA: NEGATIVE
Specific Gravity, UA: 1.015 (ref 1.005–1.030)
Urobilinogen, Ur: 0.2 mg/dL (ref 0.2–1.0)
pH, UA: 6 (ref 5.0–7.5)

## 2024-10-09 LAB — MICROSCOPIC EXAMINATION: Bacteria, UA: NONE SEEN

## 2024-10-09 NOTE — Progress Notes (Signed)
   10/09/24  CC:  Chief Complaint  Patient presents with   Cysto   Urologic history:  1.  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  He has no complaints today.  UA negative microscopy  Blood pressure (!) 181/78, pulse 83, height 5' 7 (1.702 m), weight 174 lb (78.9 kg).  Cystoscopy Procedure Note  Patient identification was confirmed, informed consent was obtained, and patient was prepped using Betadine solution.  Lidocaine  jelly was administered per urethral meatus.     Pre-Procedure: - Inspection reveals a subcoronal hypospadias  Procedure: The flexible cystoscope was introduced without difficulty - No urethral strictures/lesions are present. - Moderate lateral lobe enlargement prostate  - Normal bladder neck - Right UO normal in appearance - Bladder mucosa  reveals no ulcers, tumors, or lesions - No bladder stones - Mild trabeculation  Retroflexion shows no lesion or tumor   Post-Procedure: - Patient tolerated the procedure well  Assessment/ Plan: No urethral stricture or recurrent bladder tumor Follow-up surveillance cystoscopy March 2026 with CT urogram   Trevor JAYSON Barba, MD

## 2024-10-09 NOTE — Patient Instructions (Signed)
Scheduling [phone] 

## 2024-10-23 ENCOUNTER — Ambulatory Visit
Admission: RE | Admit: 2024-10-23 | Discharge: 2024-10-23 | Disposition: A | Discharge status: Home / Self Care | Source: Ambulatory Visit | Attending: Urology | Admitting: Urology

## 2024-10-23 DIAGNOSIS — Z8551 Personal history of malignant neoplasm of bladder: Secondary | ICD-10-CM | POA: Diagnosis present

## 2024-10-23 LAB — POCT I-STAT CREATININE: Creatinine, Ser: 1.4 mg/dL — ABNORMAL HIGH (ref 0.61–1.24)

## 2024-10-23 MED ORDER — IOHEXOL 300 MG/ML  SOLN
100.0000 mL | Freq: Once | INTRAMUSCULAR | Status: AC | PRN
  Administered 2024-10-23: 100 mL via INTRAVENOUS

## 2024-10-24 ENCOUNTER — Ambulatory Visit: Payer: Self-pay | Admitting: Urology

## 2024-10-25 ENCOUNTER — Ambulatory Visit: Payer: Self-pay | Admitting: Urology

## 2024-10-27 ENCOUNTER — Other Ambulatory Visit: Payer: Self-pay | Admitting: Physician Assistant

## 2025-01-07 ENCOUNTER — Other Ambulatory Visit: Payer: Self-pay | Admitting: *Deleted

## 2025-01-07 DIAGNOSIS — R310 Gross hematuria: Secondary | ICD-10-CM

## 2025-01-07 DIAGNOSIS — N400 Enlarged prostate without lower urinary tract symptoms: Secondary | ICD-10-CM

## 2025-01-07 MED ORDER — FINASTERIDE 5 MG PO TABS: 5.0000 mg | ORAL_TABLET | Freq: Every morning | ORAL | 3 refills | Status: AC

## 2025-02-26 ENCOUNTER — Other Ambulatory Visit: Admitting: Urology
